# Patient Record
Sex: Female | Born: 1945 | Race: Black or African American | Hispanic: No | Marital: Married | Smoking: Former smoker
Health system: Southern US, Community
[De-identification: ages and names within clinical notes are randomized; demographics above are authoritative.]

## PROBLEM LIST (undated history)

## (undated) DIAGNOSIS — I509 Heart failure, unspecified: Secondary | ICD-10-CM

## (undated) DIAGNOSIS — K08109 Complete loss of teeth, unspecified cause, unspecified class: Secondary | ICD-10-CM

## (undated) DIAGNOSIS — I10 Essential (primary) hypertension: Secondary | ICD-10-CM

## (undated) DIAGNOSIS — I428 Other cardiomyopathies: Secondary | ICD-10-CM

## (undated) DIAGNOSIS — K259 Gastric ulcer, unspecified as acute or chronic, without hemorrhage or perforation: Secondary | ICD-10-CM

## (undated) DIAGNOSIS — Z9989 Dependence on other enabling machines and devices: Secondary | ICD-10-CM

## (undated) DIAGNOSIS — M1990 Unspecified osteoarthritis, unspecified site: Secondary | ICD-10-CM

## (undated) DIAGNOSIS — J45909 Unspecified asthma, uncomplicated: Secondary | ICD-10-CM

## (undated) DIAGNOSIS — J449 Chronic obstructive pulmonary disease, unspecified: Secondary | ICD-10-CM

## (undated) DIAGNOSIS — E119 Type 2 diabetes mellitus without complications: Secondary | ICD-10-CM

## (undated) DIAGNOSIS — H9190 Unspecified hearing loss, unspecified ear: Secondary | ICD-10-CM

## (undated) DIAGNOSIS — M419 Scoliosis, unspecified: Secondary | ICD-10-CM

## (undated) DIAGNOSIS — G4733 Obstructive sleep apnea (adult) (pediatric): Secondary | ICD-10-CM

## (undated) DIAGNOSIS — K219 Gastro-esophageal reflux disease without esophagitis: Secondary | ICD-10-CM

## (undated) DIAGNOSIS — E785 Hyperlipidemia, unspecified: Secondary | ICD-10-CM

## (undated) DIAGNOSIS — J302 Other seasonal allergic rhinitis: Secondary | ICD-10-CM

## (undated) DIAGNOSIS — Z972 Presence of dental prosthetic device (complete) (partial): Secondary | ICD-10-CM

## (undated) DIAGNOSIS — D509 Iron deficiency anemia, unspecified: Secondary | ICD-10-CM

## (undated) HISTORY — DX: Heart failure, unspecified: I50.9

## (undated) HISTORY — PX: APPENDECTOMY: SHX54

## (undated) HISTORY — DX: Chronic obstructive pulmonary disease, unspecified: J44.9

## (undated) HISTORY — DX: Scoliosis, unspecified: M41.9

## (undated) HISTORY — DX: Dependence on other enabling machines and devices: Z99.89

## (undated) HISTORY — DX: Hyperlipidemia, unspecified: E78.5

## (undated) HISTORY — DX: Unspecified osteoarthritis, unspecified site: M19.90

## (undated) HISTORY — PX: HERNIA REPAIR: SHX51

## (undated) HISTORY — PX: CARDIAC CATHETERIZATION: SHX172

## (undated) HISTORY — DX: Obstructive sleep apnea (adult) (pediatric): G47.33

## (undated) HISTORY — DX: Gastro-esophageal reflux disease without esophagitis: K21.9

## (undated) HISTORY — DX: Type 2 diabetes mellitus without complications: E11.9

## (undated) HISTORY — PX: BREAST CYST ASPIRATION: SHX578

## (undated) HISTORY — PX: ABDOMINAL HYSTERECTOMY: SHX81

## (undated) HISTORY — DX: Essential (primary) hypertension: I10

## (undated) HISTORY — PX: CLAVICLE SURGERY: SHX598

## (undated) HISTORY — PX: BREAST SURGERY: SHX581

## (undated) HISTORY — PX: OTHER SURGICAL HISTORY: SHX169

---
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
## 1997-02-19 HISTORY — PX: BREAST BIOPSY: SHX20

## 2003-04-14 ENCOUNTER — Other Ambulatory Visit: Payer: Self-pay

## 2006-06-01 ENCOUNTER — Emergency Department: Payer: Self-pay | Admitting: Emergency Medicine

## 2007-02-20 ENCOUNTER — Inpatient Hospital Stay: Payer: Self-pay | Admitting: Internal Medicine

## 2007-02-20 ENCOUNTER — Other Ambulatory Visit: Payer: Self-pay

## 2007-03-21 ENCOUNTER — Ambulatory Visit: Payer: Self-pay | Admitting: Cardiovascular Disease

## 2007-05-21 ENCOUNTER — Emergency Department: Payer: Self-pay | Admitting: Emergency Medicine

## 2007-11-06 ENCOUNTER — Encounter
Admission: RE | Admit: 2007-11-06 | Discharge: 2007-11-06 | Payer: Self-pay | Admitting: Physical Medicine & Rehabilitation

## 2008-01-08 ENCOUNTER — Ambulatory Visit: Payer: Self-pay | Admitting: Cardiovascular Disease

## 2008-01-08 LAB — CONVERTED CEMR LAB
CO2: 25 meq/L (ref 19–32)
Chloride: 106 meq/L (ref 96–112)
Hemoglobin: 12 g/dL (ref 12.0–15.0)
INR: 1 (ref 0.0–1.5)
MCV: 94.2 fL (ref 78.0–100.0)
Potassium: 3.8 meq/L (ref 3.5–5.3)
Prothrombin Time: 13.9 s (ref 11.6–15.2)
RBC: 3.96 M/uL (ref 3.87–5.11)
WBC: 6.6 10*3/uL (ref 4.0–10.5)

## 2008-01-16 ENCOUNTER — Ambulatory Visit: Payer: Self-pay | Admitting: Cardiovascular Disease

## 2008-01-16 ENCOUNTER — Ambulatory Visit: Payer: Self-pay

## 2008-01-29 ENCOUNTER — Ambulatory Visit: Payer: Self-pay | Admitting: Cardiovascular Disease

## 2008-12-16 DIAGNOSIS — I10 Essential (primary) hypertension: Secondary | ICD-10-CM | POA: Insufficient documentation

## 2008-12-16 DIAGNOSIS — E785 Hyperlipidemia, unspecified: Secondary | ICD-10-CM | POA: Insufficient documentation

## 2010-07-04 NOTE — Assessment & Plan Note (Signed)
[facility] OFFICE NOTE   NAME:Diane Patton, Diane Patton                          MRN:          [id number]  DATE:01/08/2008                            DOB:          [birth year]    PRIMARY CARE PHYSICIAN:  Dr. Gelene Mink.   REASON FOR CONSULTATION:  Chest pain, shortness of breath, and abnormal  nuclear stress test in January 2009.   HISTORY OF PRESENT ILLNESS:  Diane Patton is a pleasant 65 year old African  American female with a past medical history significant for diabetes  mellitus, hypertension, hyperlipidemia, and obstructive sleep apnea, who  is referred today for further evaluation of chest pain, shortness of  breath, and an abnormal finding on a nuclear stress test performed in  January 2009.  Diane Patton tells me that she has been having chest pain  several times per week as well as shortness of breath with minimal  exertion for over a year now.  She was referred to the [facility] for a nuclear stress test on March 17, 2007.  The  stress portion of the test was performed with adenosine infusion.  This  test was read by Dr. Kirke Corin, who is a cardiologist in [facility].  He reported evidence of ischemia in the inferior and  inferolateral wall that was suggestive of coronary artery disease in the  left circumflex artery distribution.  The patient was found to have  normal left ventricular function during this study with an ejection  fraction of 71%.  Wall motion was also reported to be normal.  Of note,  the patient had tongue swelling during the testing, which was felt to be  due to her latex allergy.  This was treated successfully with Benadryl  and Solu-Medrol.  Dr. Kirke Corin recommended heart catheterization during  that visit; however, the patient states that she has not undergone a  cardiac catheterization.  She was not aware that this had been  recommended.  She did not  follow up with Dr. Kirke Corin after the day of her  stress testing.   She comes in today and tells me that she is having episodes of  substernal chest pain several times per week.  There is typically no  radiation of the pain.  However, there is associated shortness of breath  and dizziness.  Overall, she has had a feeling of fatigue during the day  and has shortness of breath with minimal exertion.  She reports  occasional dizziness.  She uses a CPAP machine at home for obstructive  sleep apnea.  She denies having any palpitations, near syncopal  episodes, syncope, orthopnea, or PND.  She does report mild lower  extremity edema for the last 30 years following an operation that she  had in her feet.   PAST MEDICAL HISTORY:  1. Diabetes mellitus.  2. Hypertension.  3. Hyperlipidemia.  4. Osteoarthritis.  5. Gastroesophageal reflux disease.  6. Scoliosis.  7. Obstructive sleep apnea on CPAP.   PAST SURGICAL HISTORY:  1. Sinus surgery.  2. Hysterectomy.  3.  Umbilical hernia repair.  4. Bilateral breast lumpectomies.   ALLERGIES:  PENICILLIN, AMOXICILLIN, and LATEX GLOVES.   CURRENT MEDICATIONS:  1. Enteric-coated aspirin 81 mg once daily.  2. Multivitamin once daily.  3. Lasix 40 mg once daily.  4. Clonidine 0.1 mg twice daily.  5. Naproxen 375 mg twice daily.  6. Loratadine 10 mg once daily.  7. Imdur 30 mg once daily.  8. BuSpar 10 mg as directed.  9. Flonase 2 sprays in each nostril daily.  10.Atenolol 100 mg once daily.  11.Omeprazole 20 mg twice daily.  12.Klor-Con 10 mEq once daily.  13.Metformin 500 mg twice daily.  14.Estradiol 100 mg once daily.  15.Soma 350 mg twice daily.  16.Trazodone p.r.n.  17.Albuterol p.r.n.   SOCIAL HISTORY:  The patient smoked 1 pack of cigarettes per day for 10  years, but has not smoked in the last 25 years.  She denies use of  alcohol or illicit drugs.  She is married and has 4 children.  One of  her children is deceased.  She tells  me that she is disabled because of  her chronic back pain.   FAMILY HISTORY:  No family history of coronary artery disease.  The  patient's father died from Alzheimer disease and her mother died from an  enlarged heart at the age of 15.   REVIEW OF SYSTEMS:  As stated in the history of present illness, is  otherwise negative.   PHYSICAL EXAMINATION:  VITALS SIGNS:  Blood pressure 178/100, pulse 85  and regular, and respirations 12 and nonlabored.  GENERAL:  She is a pleasant, middle-aged [place] American female in no  acute distress.  She is alert and oriented x3.  HEENT:  Normal.  SKIN:  Warm and dry.  MUSCULOSKELETAL:  Muscle strength and tone is normal.  PSYCHIATRIC:  Mood and affect are appropriate.  NEUROLOGIC:  No focal neurological deficits.  NECK:  No JVD.  No carotid bruits.  No thyromegaly.  No lymphadenopathy.  LUNGS:  Clear to auscultation bilaterally without wheezes, rhonchi, or  crackles noted.  CARDIOVASCULAR:  Regular rate and rhythm with no murmurs, gallops, or  rubs noted.  ABDOMEN:  Soft and nontender.  Bowel sounds present.  EXTREMITIES:  Trace bilateral lower extremity edema.  Pulses are 1+ in  all extremities.   DIAGNOSTIC STUDIES:  1. A 12-lead EKG obtained in our office today shows normal sinus      rhythm with nonspecific ST and T-wave abnormalities noted.      Ventricular rate is 85 beats per minute.  2. Echocardiogram performed on March 10, 2007, and read by Dr. Kirke Corin      shows normal left ventricular systolic function with an ejection      fraction of 68%.  There is mild left ventricular hypertrophy with      mild diastolic dysfunction.  There is mild tricuspid regurgitation.      There were no other significant valvular abnormalities noted.  3. Carotid duplex study from March 10, 2007, showed no      hemodynamically significant stenosis in the internal carotid      arteries.  The right vertebral artery is patent with antegrade      flow.  Left  vertebral artery is not identified.  4. Adenosine myocardial perfusion study performed on March 17, 2007,      at [facility] [place] and      read by Dr. Kirke Corin, shows that the patient was  given adenosine for      the stress portion of her test.  The nuclear portion of the test      revealed a moderate-sized defect in the inferior and inferolateral      wall with partial reversibility.  Overall, left ventricular      systolic function was preserved with an ejection fraction of 71%.      There were no wall motion abnormalities noted.  This was read as      being significant for ischemia in the circumflex artery      distribution.   ASSESSMENT AND PLAN:  This is a pleasant 65 year old African American  female with multiple risk factors for coronary artery disease including  hypertension, diabetes mellitus, hyperlipidemia, obesity, and sedentary  lifestyle, who had an abnormal adenosine nuclear stress study in January  2009, suggesting inferior and inferolateral wall ischemia.  The patient  has not had a followup cardiac catheterization as recommended at that  time.  She continues to have episodes of chest discomfort and shortness  of breath several times per week, which could be secondary to  obstructive coronary artery disease.  I do not see any contraindications  currently to performing a diagnostic left heart catheterization.  I have  discussed this procedure in detail with the patient here in my office.  She is in agreement to have this procedure done.  I have explained the  risk of bleeding and infection in the groin as well as stroke and heart  attack from the passage of the diagnostic catheters around the aortic  arch and into the coronary arteries.  I have also explained the  potential nephrotoxicity of IV contrast dye.  We will proceed with the  heart catheterization, and we will plan it for January 16, 2008, at  [facility].  We will obtain a metabolic profile as  well as a CBC and coagulation studies today.  We will also obtain a  baseline chest x-ray prior to the cardiac catheterization.  The patient  will be seen back in this office in 3-4 weeks to review the results of  her cardiac catheterization.     Verne Carrow, MD  Electronically Signed    CM/MedQ  DD: 01/08/2008  DT: 01/09/2008  Job #: [id number]   cc:   Dr. Gelene Mink

## 2010-07-04 NOTE — Assessment & Plan Note (Signed)
[facility] OFFICE NOTE   NAME:Diane Patton, Diane Patton                          MRN:          [id number]  DATE:01/29/2008                            DOB:          1946-01-19    PRIMARY CARE PHYSICIAN:  Dr. Evelene Croon (the patient was followed  in this clinic by [facility], PA-C).   HISTORY OF PRESENT ILLNESS:  Diane Patton a pleasant 65 year old African  American female with a past medical history significant for diabetes  mellitus, hypertension, hyperlipidemia, and obstructive sleep apnea who  was initially seen in our office on January 08, 2008, for further  evaluation of complaints of occasional chest discomfort.  The patient  had been found to have possible inferior and inferolateral ischemia on a  stress test performed in the [facility] in January  2009.  Based on her symptoms and the abnormal stress test, I elected to  perform a diagnostic left heart catheterization.  This was performed on  January 16, 2008, at [facility].  This is  outlined in detail below, but essentially just showed nonobstructive  coronary artery disease with normal LV function.  The patient comes in  today for followup and tells me that she has had no recurrence of her  chest pain over the last several weeks.  She notes occasional shortness  of breath with exertion, but has no other complaints.  She denies any  palpitations, diaphoresis, nausea, vomiting, near syncope, syncope,  orthopnea, PND, or lower extremity edema.   PAST MEDICAL HISTORY:  1. Diabetes mellitus.  2. Hypertension.  3. Hyperlipidemia.  4. Osteoarthritis.  5. Gastroesophageal reflux disease.  6. Scoliosis.  7. Obstructive sleep apnea on CPAP.   CURRENT MEDICATIONS:  1. Enteric-coated aspirin 81 mg once daily.  2. Multivitamin once daily.  3. Lasix 40 mg, used on a p.r.n. basis.  4. Clonidine 0.1 mg twice  daily.  5. Naprosyn 375 mg twice daily.  6. Loratadine 10 mg once daily.  7. Imdur 30 mg once daily.  8. Flonase 2 puffs in each nostril once daily.  9. Atenolol 100 mg once daily.  10.Omeprazole 20 mg twice daily.  11.Klor-Con 10 mEq once daily.  12.Metformin 500 mg twice daily.  13.Estradiol 100 mg once daily.  14.Soma 350 mg twice daily.  15.A statin medication that was started recently.   REVIEW OF SYSTEMS:  As stated in history present illness and is  otherwise negative.   PHYSICAL EXAMINATION:  VITALS:  Blood pressure 150/88, pulse 84 and  regular, respirations 12 and unlabored.  GENERAL:  She is a pleasant middle-aged African American female in no  acute distress.  She is alert and oriented x3.  NECK:  No JVD.  No carotid bruits.  No thyromegaly.  No lymphadenopathy.  LUNGS:  Clear to auscultation bilaterally without wheezes, rhonchi, or  crackles noted.  CARDIOVASCULAR:  Regular rate and rhythm without  murmurs, gallops, or rubs noted.  ABDOMEN:  Soft, nontender, nondistended.  Bowel sounds present.  EXTREMITIES:  No evidence of edema.  The right groin cath site is  without evidence of hematoma.  Pulses are 2+.  SKIN:  Warm and dry.   DIAGNOSTIC STUDIES:  1. Diagnostic left heart catheterization performed on January 16, 2008, showed a 20% stenosis in the distal left main coronary      artery.  There was a 40% stenosis in the ostium of the left      anterior descending coronary artery.  There was 30% stenosis in the      midportion of the LAD.  The first 2 diagonal branches were medium-      size vessels with no evidence of disease.  The third diagonal was      small size and showed no evidence of disease.  There was a 30%      lesion in the midportion of the circumflex artery.  The first      obtuse marginal branch was medium size and had no evidence of      disease.  The second marginal branch was small size and showed no      evidence of disease.  The third  marginal was medium size and showed      no evidence of disease.  There were minor luminal irregularities      noted in the right coronary artery which was a normal-sized vessel.      This bifurcated into a PDA segment and a posterolateral segment      that were free of disease.  Left ventricular function was noted to      be normal.   ASSESSMENT AND PLAN:  This is a pleasant 65 year old [place] American  female with history of hypertension, diabetes mellitus, hyperlipidemia,  obesity, and sedentary lifestyle, who was recently found by left heart  catheterization to have nonobstructive coronary artery disease.  I have  recommended that the patient continue to take daily aspirin as well as a  beta-blocker and a long-acting nitrate.  I also started her on statin  medication when she was discharged from the cath lab after her  procedure.  She tells me that she is to follow up in her primary care  office in approximately 6-7 weeks.  We are asking that a fasting lipid  profile as well as liver function tests and a CK level be drawn at the  time of that visit.  We will send an order over requesting these  laboratory values to be obtained.  The patient will be followed in this  office on a yearly basis.  We will arrange for followup in 1 year.  I  have encouraged her to continue to follow with her primary care  physician's office for her other primary care needs.  I have also  discussed the  importance of daily exercise as well as appropriate diet and weight  loss.  The patient is agreeable to the current plan.     Verne Carrow, MD  Electronically Signed    CM/MedQ  DD: 01/29/2008  DT: 01/29/2008  Job #: [id number]   cc:   Diane Rhodes, PA-C

## 2010-08-08 ENCOUNTER — Encounter: Payer: Self-pay | Admitting: Cardiovascular Disease

## 2012-09-03 ENCOUNTER — Inpatient Hospital Stay: Payer: Self-pay | Admitting: Specialist

## 2012-09-03 LAB — BASIC METABOLIC PANEL
BUN: 16 mg/dL (ref 7–18)
Calcium, Total: 9 mg/dL (ref 8.5–10.1)
Chloride: 104 mmol/L (ref 98–107)
Co2: 25 mmol/L (ref 21–32)
EGFR (Non-African Amer.): 45 — ABNORMAL LOW
Glucose: 359 mg/dL — ABNORMAL HIGH (ref 65–99)
Osmolality: 291 (ref 275–301)
Potassium: 3.6 mmol/L (ref 3.5–5.1)

## 2012-09-03 LAB — CBC
HCT: 39.1 % (ref 35.0–47.0)
MCHC: 30.8 g/dL — ABNORMAL LOW (ref 32.0–36.0)
Platelet: 348 10*3/uL (ref 150–440)
RDW: 14.7 % — ABNORMAL HIGH (ref 11.5–14.5)

## 2012-09-03 LAB — HEMOGLOBIN A1C: Hemoglobin A1C: 7.5 % — ABNORMAL HIGH (ref 4.2–6.3)

## 2012-09-03 LAB — CK TOTAL AND CKMB (NOT AT ARMC)
CK, Total: 605 U/L — ABNORMAL HIGH (ref 21–215)
CK-MB: 3.5 ng/mL (ref 0.5–3.6)

## 2012-09-03 LAB — TROPONIN I
Troponin-I: <0.02 ng/mL
Troponin-I: 0.14 ng/mL — ABNORMAL HIGH

## 2012-09-04 LAB — BASIC METABOLIC PANEL
BUN: 17 mg/dL (ref 7–18)
Chloride: 103 mmol/L (ref 98–107)
Co2: 24 mmol/L (ref 21–32)
Creatinine: 1.34 mg/dL — ABNORMAL HIGH (ref 0.60–1.30)
EGFR (Non-African Amer.): 40 — ABNORMAL LOW
Potassium: 3.4 mmol/L — ABNORMAL LOW (ref 3.5–5.1)
Sodium: 138 mmol/L (ref 136–145)

## 2012-09-04 LAB — CBC WITH DIFFERENTIAL/PLATELET
Basophil #: 0 10*3/uL (ref 0.0–0.1)
Basophil %: 0.1 %
Eosinophil #: 0 10*3/uL (ref 0.0–0.7)
Eosinophil %: 0 %
HGB: 10 g/dL — ABNORMAL LOW (ref 12.0–16.0)
Lymphocyte %: 5.3 %
MCH: 27.4 pg (ref 26.0–34.0)
MCHC: 33.1 g/dL (ref 32.0–36.0)
MCV: 83 fL (ref 80–100)
Monocyte %: 2.5 %
Platelet: 238 10*3/uL (ref 150–440)
RBC: 3.64 10*6/uL — ABNORMAL LOW (ref 3.80–5.20)
RDW: 14.6 % — ABNORMAL HIGH (ref 11.5–14.5)
WBC: 11.7 10*3/uL — ABNORMAL HIGH (ref 3.6–11.0)

## 2012-09-04 LAB — LIPID PANEL
Cholesterol: 93 mg/dL (ref 0–200)
Ldl Cholesterol, Calc: 0 mg/dL (ref 0–100)
Triglycerides: 236 mg/dL — ABNORMAL HIGH (ref 0–200)
VLDL Cholesterol, Calc: 47 mg/dL — ABNORMAL HIGH (ref 5–40)

## 2012-09-04 LAB — POTASSIUM
Potassium: 2.9 mmol/L — ABNORMAL LOW (ref 3.5–5.1)
Potassium: 4 mmol/L (ref 3.5–5.1)

## 2012-09-04 LAB — MAGNESIUM
Magnesium: 1 mg/dL — ABNORMAL LOW
Magnesium: 2.1 mg/dL

## 2012-09-05 LAB — BASIC METABOLIC PANEL
Anion Gap: 7 (ref 7–16)
BUN: 22 mg/dL — ABNORMAL HIGH (ref 7–18)
Calcium, Total: 9.6 mg/dL (ref 8.5–10.1)
Chloride: 108 mmol/L — ABNORMAL HIGH (ref 98–107)
Co2: 26 mmol/L (ref 21–32)
Creatinine: 1.1 mg/dL (ref 0.60–1.30)
EGFR (African American): 59 — ABNORMAL LOW

## 2012-09-05 LAB — MAGNESIUM: Magnesium: 1.9 mg/dL

## 2012-09-06 LAB — BASIC METABOLIC PANEL
Anion Gap: 5 — ABNORMAL LOW (ref 7–16)
BUN: 22 mg/dL — ABNORMAL HIGH (ref 7–18)
Chloride: 102 mmol/L (ref 98–107)
Creatinine: 0.98 mg/dL (ref 0.60–1.30)
EGFR (African American): >60
EGFR (Non-African Amer.): 59 — ABNORMAL LOW
Glucose: 153 mg/dL — ABNORMAL HIGH (ref 65–99)
Sodium: 139 mmol/L (ref 136–145)

## 2012-09-06 LAB — EXPECTORATED SPUTUM ASSESSMENT W REFEX TO RESP CULTURE

## 2012-09-07 LAB — BASIC METABOLIC PANEL
Anion Gap: 7 (ref 7–16)
BUN: 25 mg/dL — ABNORMAL HIGH (ref 7–18)
Calcium, Total: 9.7 mg/dL (ref 8.5–10.1)
Creatinine: 1 mg/dL (ref 0.60–1.30)
EGFR (African American): >60
EGFR (Non-African Amer.): 57 — ABNORMAL LOW
Osmolality: 280 (ref 275–301)
Potassium: 3.1 mmol/L — ABNORMAL LOW (ref 3.5–5.1)
Sodium: 135 mmol/L — ABNORMAL LOW (ref 136–145)

## 2012-09-08 LAB — CULTURE, BLOOD (SINGLE)

## 2012-10-14 ENCOUNTER — Ambulatory Visit: Payer: Self-pay | Admitting: Cardiovascular Disease

## 2013-07-30 ENCOUNTER — Ambulatory Visit: Payer: Self-pay | Admitting: Orthopedic Surgery

## 2013-08-12 ENCOUNTER — Ambulatory Visit: Payer: Self-pay | Admitting: Orthopedic Surgery

## 2013-08-12 DIAGNOSIS — I10 Essential (primary) hypertension: Secondary | ICD-10-CM

## 2013-08-12 LAB — BASIC METABOLIC PANEL
ANION GAP: 6 — AB (ref 7–16)
BUN: 12 mg/dL (ref 7–18)
Calcium, Total: 10.3 mg/dL — ABNORMAL HIGH (ref 8.5–10.1)
Chloride: 103 mmol/L (ref 98–107)
Co2: 33 mmol/L — ABNORMAL HIGH (ref 21–32)
Creatinine: 0.71 mg/dL (ref 0.60–1.30)
EGFR (African American): >60
EGFR (Non-African Amer.): >60
Glucose: 97 mg/dL (ref 65–99)
OSMOLALITY: 283 (ref 275–301)
POTASSIUM: 3.2 mmol/L — AB (ref 3.5–5.1)
Sodium: 142 mmol/L (ref 136–145)

## 2013-08-12 LAB — CBC
HCT: 34.5 % — ABNORMAL LOW (ref 35.0–47.0)
HGB: 11.1 g/dL — ABNORMAL LOW (ref 12.0–16.0)
MCH: 29.3 pg (ref 26.0–34.0)
MCHC: 32.2 g/dL (ref 32.0–36.0)
MCV: 91 fL (ref 80–100)
Platelet: 219 10*3/uL (ref 150–440)
RBC: 3.79 10*6/uL — ABNORMAL LOW (ref 3.80–5.20)
RDW: 14.4 % (ref 11.5–14.5)
WBC: 7.6 10*3/uL (ref 3.6–11.0)

## 2013-08-12 LAB — URINALYSIS, COMPLETE
BACTERIA: NONE SEEN
BILIRUBIN, UR: NEGATIVE
Blood: NEGATIVE
Glucose,UR: NEGATIVE mg/dL (ref 0–75)
Ketone: NEGATIVE
Leukocyte Esterase: NEGATIVE
Nitrite: NEGATIVE
PROTEIN: NEGATIVE
Ph: 8 (ref 4.5–8.0)
RBC,UR: 1 /HPF (ref 0–5)
Specific Gravity: 1.021 (ref 1.003–1.030)
Squamous Epithelial: 3

## 2013-08-12 LAB — PROTIME-INR
INR: 1
Prothrombin Time: 12.9 secs (ref 11.5–14.7)

## 2013-08-12 LAB — MRSA PCR SCREENING

## 2013-08-12 LAB — SEDIMENTATION RATE: ERYTHROCYTE SED RATE: 10 mm/h (ref 0–30)

## 2013-08-12 LAB — APTT: ACTIVATED PTT: 28.3 s (ref 23.6–35.9)

## 2013-12-05 ENCOUNTER — Inpatient Hospital Stay: Payer: Self-pay | Admitting: Internal Medicine

## 2013-12-05 LAB — CK TOTAL AND CKMB (NOT AT ARMC)
CK, Total: 121 U/L
CK, Total: 162 U/L
CK-MB: 2.1 ng/mL (ref 0.5–3.6)
CK-MB: 3 ng/mL (ref 0.5–3.6)

## 2013-12-05 LAB — TROPONIN I
Troponin-I: 0.13 ng/mL — ABNORMAL HIGH
Troponin-I: 0.15 ng/mL — ABNORMAL HIGH

## 2013-12-06 LAB — CBC WITH DIFFERENTIAL/PLATELET
Basophil #: 0 10*3/uL (ref 0.0–0.1)
Basophil %: 0.1 %
Eosinophil #: 0 10*3/uL (ref 0.0–0.7)
Eosinophil %: 0 %
HCT: 29.2 % — ABNORMAL LOW (ref 35.0–47.0)
HGB: 9.3 g/dL — AB (ref 12.0–16.0)
LYMPHS ABS: 1 10*3/uL (ref 1.0–3.6)
LYMPHS PCT: 9 %
MCH: 29 pg (ref 26.0–34.0)
MCHC: 31.9 g/dL — AB (ref 32.0–36.0)
MCV: 91 fL (ref 80–100)
MONO ABS: 0.2 x10 3/mm (ref 0.2–0.9)
Monocyte %: 2.2 %
NEUTROS PCT: 88.7 %
Neutrophil #: 10 10*3/uL — ABNORMAL HIGH (ref 1.4–6.5)
PLATELETS: 248 10*3/uL (ref 150–440)
RBC: 3.2 10*6/uL — AB (ref 3.80–5.20)
RDW: 14.3 % (ref 11.5–14.5)
WBC: 11.2 10*3/uL — ABNORMAL HIGH (ref 3.6–11.0)

## 2013-12-06 LAB — BASIC METABOLIC PANEL
Anion Gap: 12 (ref 7–16)
BUN: 17 mg/dL (ref 7–18)
Calcium, Total: 8.5 mg/dL (ref 8.5–10.1)
Chloride: 107 mmol/L (ref 98–107)
Co2: 22 mmol/L (ref 21–32)
Creatinine: 1.26 mg/dL (ref 0.60–1.30)
EGFR (African American): 54 — ABNORMAL LOW
GFR CALC NON AF AMER: 45 — AB
Glucose: 199 mg/dL — ABNORMAL HIGH (ref 65–99)
OSMOLALITY: 288 (ref 275–301)
POTASSIUM: 4.2 mmol/L (ref 3.5–5.1)
Sodium: 141 mmol/L (ref 136–145)

## 2013-12-08 LAB — CREATININE, SERUM
Creatinine: 1.22 mg/dL (ref 0.60–1.30)
EGFR (Non-African Amer.): 46 — ABNORMAL LOW
GFR CALC AF AMER: 56 — AB

## 2013-12-10 LAB — CULTURE, BLOOD (SINGLE)

## 2013-12-25 ENCOUNTER — Ambulatory Visit: Payer: Self-pay | Admitting: Family

## 2014-01-21 ENCOUNTER — Ambulatory Visit: Payer: Self-pay | Admitting: Family

## 2014-03-01 ENCOUNTER — Ambulatory Visit: Payer: Self-pay | Admitting: Family

## 2014-05-12 ENCOUNTER — Ambulatory Visit: Payer: Self-pay | Admitting: Cardiology

## 2014-05-26 ENCOUNTER — Ambulatory Visit
Admit: 2014-05-26 | Disposition: A | Discharge status: Home / Self Care | Payer: Self-pay | Attending: Family | Admitting: Family

## 2014-06-09 DIAGNOSIS — I5023 Acute on chronic systolic (congestive) heart failure: Secondary | ICD-10-CM | POA: Insufficient documentation

## 2014-06-09 DIAGNOSIS — I5022 Chronic systolic (congestive) heart failure: Secondary | ICD-10-CM | POA: Insufficient documentation

## 2014-06-11 NOTE — Discharge Summary (Signed)
PATIENT NAME:  Diane Patton, Diane Patton MR#:  [id number] DATE OF BIRTH:  November 02, 1945  DATE OF ADMISSION:  09/03/2012 DATE OF DISCHARGE:  09/07/2012  HISTORY AND PHYSICAL:  For a detailed note, please take a look at the history and physical done on admission by Dr. Lunette Stands.   DISCHARGE DIAGNOSES:  1.  Acute respiratory failure, likely secondary to congestive heart failure.  2.  Acute on chronic systolic congestive heart failure.  3.  Diabetes.  4.  Restless leg syndrome.  5.  Hyperlipidemia.  6.  Chronic obstructive pulmonary disease.  7.  Medical noncompliance.  8.  Osteoarthritis.   DIET:  The patient is being discharged on a low-sodium, low-fat, American Diabetic Association diet.   ACTIVITY: As tolerated.   FOLLOWUP: Followup with Dr. Neoma Laming in the next 1 to 2 weeks. Also follow up with Dr. Brunetta Genera in 1 to 2 weeks.    DISCHARGE MEDICATIONS: As follows: Lasix 40 mg daily, metformin 500 mg b.i.d., loratadine 10 mg daily, estradiol 1 mg daily, hydrochlorothiazide/valsartan 25/320, 1 tab daily, Requip 0.5 mg at bedtime, Singulair 10 mg at bedtime, omeprazole 20 mg b.i.d., Flexeril 10 mg t.i.d. as needed, Crestor 20 mg daily, tramadol 50 mg q.i.d. as needed, multivitamin daily, fish oil 1000 mg daily, vitamin E 400 international units daily, cod liver oil, vitamin A and vitamin D supplement daily, vitamin B12, 500 mcg daily, vitamin D3, 1000 international units daily, ketoconazole 2% topical cream to be applied as needed, trazodone 50 mg at bedtime, albuterol inhaler 2 puffs q.i.d. as needed, Advair 250/50, 1 puff b.i.d., meloxicam 7.5 mg daily, amlodipine 10 mg daily, atenolol 100 mg daily and aspirin 81 mg daily.   [place] COURSE: Dr. Neoma Laming from cardiology.  Dr.  Mortimer Fries from pulmonary critical care.   PERTINENT STUDIES DONE DURING THE HOSPITAL COURSE: Are as follows:  A chest x-ray done on admission showing findings suggestive of mild interstitial edema. No alveolar  pneumonia or pleural effusion noted. A 2-dimensional echocardiogram done showing ejection fraction to be 30% to 35%, severely dilated left atrium, mildly dilated right atrium, trivial pericardial effusion, mildly elevated pulmonary artery systolic pressures.  Sputum cultures to be consistent with normal flora and blood cultures essentially negative.   BRIEF HOSPITAL COURSE: This is a 69 year old female with medical problems as mentioned above, presented to the hospital on 09/03/2012 secondary to shortness of breath and respiratory failure and was urgently intubated.   1.  Acute respiratory failure:  The likely cause of this was probably acute on chronic systolic congestive heart failure. Initially, when the patient was admitted to the hospital the patient was urgently intubated, started on IV steroids, also started on IV diuretics and broad spectrum IV antibiotics to cover for COPD exacerbation and also congestive heart failure. Upon getting an echocardiogram and further clinically assessing her, the likely cause of her respiratory failure was probably related to congestive heart failure. She diuresed well with IV Lasix. She was only intubated for 2 days, shortly thereafter extubated, and has been on room air doing clinically very well. The patient apparently was not compliant with her diuretics and her regular home meds which is what led to her congestive heart failure. She was strongly advised to be compliant with her medications. A cardiology consult was obtained. The patient was seen by Dr. Neoma Laming, who agreed with that, and he will continue followup with the patient in the next week to 2 weeks after discharge. The patient, as mentioned, is currently  off steroids, off antibiotics, is being discharged on Lasix, beta blockers and valsartan as stated.  2.  Acute on chronic congestive heart failure: This was likely acute on chronic systolic dysfunction secondary to cardiomyopathy, EF of 30 to 35% based on  echocardiogram. The patient was noncompliant with her medications. She was diuresed aggressively with IV Lasix and has clinically significantly improved. Currently, she is being discharged on her Lasix, her atenolol and her valsartan/HCTZ combination medication. She will follow up with Dr. Humphrey Rolls in the next 1 to 2 weeks.  3.  Elevated troponin:  This was likely in the setting of hypoxemia and respiratory failure, likely demand ischemia. There was no evidence of acute coronary syndrome. Her troponin did trend down. She will continue her aspirin, beta blocker and statin as stated.  4.  Diabetes:  The patient's blood sugars remained stable while in the hospital. Her metformin was held. She was maintained on sliding scale insulin and some oral glipizide. For now she is being discharged on her oral metformin as stated.  5.  Hypomagnesemia and hypokalemia:  This was secondary to aggressive diuresis with Lasix.  Her potassium and magnesium have since then been replaced and normalized.  6.  Chronic obstructive pulmonary disease:  The patient is already on albuterol inhaler as needed. Advair is being added to her regimen for her COPD management. She was ambulated on room air and did not need home oxygen.   DISPOSITION: The patient is being discharged home with home health nursing services for medication management and for her management of her congestive heart failure. The patient is in agreement with this plan.   TIME SPENT: 40 minutes.   ____________________________ Belia Heman. Verdell Carmine, MD [id number] D: 09/07/2012 13:54:35 ET T: 09/07/2012 22:37:48 ET JOB#: [id number]  cc: Belia Heman. Verdell Carmine, MD, <Dictator> Dionisio David, MD [place] A. Brunetta Genera, MD Henreitta Leber MD ELECTRONICALLY SIGNED 09/08/2012 14:48

## 2014-06-11 NOTE — H&P (Signed)
PATIENT NAME:  Diane Patton, Diane Patton MR#:  [id number] DATE OF BIRTH:  [birth year]  DATE OF ADMISSION:  09/03/2012  PRIMARY CARE PHYSICIAN:  Dr. Lorelee Market.   REFERRING PHYSICIAN:  Dr. Owens Shark.   CHIEF COMPLAINT:  Respiratory distress.   HISTORY OF PRESENT ILLNESS:  The patient is a 69 year old female with history of asthma. Has been experiencing symptoms of the shortness of breath for the last 5 days. Went to primary care physician. Was given steroids. Despite that, the patient's condition continued to get worse. Today, patient was found to be in severe respiratory distress. Considering this, EMS was called and was brought to the Emergency Department. The patient was hypoxic with low oxygen saturations to 60s. The patient was placed on BiPAP; however, continued to have O2 sats of 80% on 100% FiO2 on BiPAP. Concerning this, patient was intubated. Per Emergency Department physician, patient had a frothy sputum, was returned through ET tube. The chest x-ray is consistent with pneumonia. The patient was receiving treatment as an outpatient with  azithromycin. No family members are available at bedside. The history is mainly obtained from the previous medical records and from the Emergency Department physician. In the Emergency Department, patient received vancomycin and Rocephin. The patient is also found to be hypotensive with systolic blood pressure in the 60s. Central line was placed and the patient was started on dopamine and the Levophed drip with improvement of the blood pressure to 120s. The patient's initial EKG was bradycardia with left bundle branch block. However, the initial set of cardiac enzymes are negative. BNP is currently pending. The patient was also found to have initial lactic acid of 6.   PAST MEDICAL HISTORY: 1.  Asthma.  2.  Hypertension.  3.  Noninsulin-dependent diabetes mellitus.  4.  Osteoarthritis.   PAST SURGICAL HISTORY: 1.  Cholecystectomy.  2.  Appendectomy.  3.   Hysterectomy.   ALLERGIES:   PENICILLIN AND ALLEGRA, UNKNOWN REACTION.   HOME MEDICATIONS: 1.  Protonix 40 mg once a day.  2.  Prednisone 40 mg once a day.  3.  Lisinopril 10 mg once a day.  4.  Gabapentin 300 mg 3 times a day.  5.  Lasix 40 mg 2 times a day.  6.  Doxepin 25 mg once a day.  7.  Desyrel 50 mg orally once a day.  8.  Buspirone 10 mg 3 times a day.  9.  Atenolol 100 mg once a day.  10.  Aspirin 81 mg once a day.  11.  Advair Diskus 250 mcg 2 times a day.   SOCIAL HISTORY: No history of smoking, drinking alcohol or use of illicit drugs. Married. Lives with her husband.  FAMILY HISTORY:  Significant for coronary artery disease.   REVIEW OF SYSTEMS:  Could not be obtained from the patient as patient is currently intubated.   PHYSICAL EXAMINATION: GENERAL:  This is well-built, well-nourished, age-appropriate female lying down in the bed, not in distress, intubated.  VITAL SIGNS:  Temperature 98.5, pulse 87, blood pressure 120/40 on Levophed and dopamine, respiratory rate of 16, oxygen saturation 100% on room air.  HEENT:  Head normocephalic, atraumatic. There is no sclerae icterus. Conjunctivae normal. Pupils equal and react to light. Mucous membranes are moist. ET tube in mouth. Could not examine the oropharynx.  NECK:  Supple. No lymphadenopathy. No JVD. No carotid bruit.  CHEST:  Has no focal tenderness. Bilateral diffuse wheezing and crackles are heard.  HEART:  S1, S2 regular. No murmurs are heard.  ABDOMEN:  Bowel sounds present. Soft, nontender, nondistended.  EXTREMITIES:  No pedal edema. Pulses 2+.   SKIN:  No rash or lesions.  MUSCULOSKELETAL:  Could not examine the range of motion.  NEUROLOGICAL:  The patient is not oriented to place, person and time. Cranial nerves:  No apparent cranial nerve abnormalities. Could not examine the motor and sensory exam.   LABORATORY DATA:  ABGs:  PH of 7.20, pCO2 of 49, pO2 of 259, on ventilator. CMP: Glucose 359. The rest of  the values are within normal limits.   CBC:  WBC of 19.6, hemoglobin 12, platelet count of 348.   Troponin less than 0.02. CK 120, MB of 1.3.   EKG, 12-lead:  Left bundle branch block.   Chest x-ray shows bilateral pulmonary edema.   ASSESSMENT AND PLAN:  The patient is a 69 year old female, comes with 5 days of respiratory distress.  1.  Respiratory failure, acute. Differential diagnoses are pulmonary edema versus acute respiratory distress syndrome from infection and pneumonia. Initial set of cardiac enzymes are negative. We will check the BNP. Also, obtain echocardiogram. The patient's oxygen saturations are doing well at FiO2 of 100%; can wean down for O2 sats greater than 90%. Considering patient's elevated lactic acid, hypotension, will also treat it as an infection with the vancomycin and cefepime. The patient received 1 dose of Rocephin without any reaction. Will obtain blood and sputum cultures. Continue with the breathing treatments and Solu-Medrol. Will also check the cortisol level.  2.  Hypotension shock.  is septic. Will continue with the pressors. Echocardiogram in the morning.  3.  Blood cultures. Continue with antibiotics.  4.  History of asthma. Continue with the current treatment.  5.  Diabetes mellitus, on oral medication, will hold. Continue with the sliding scale insulin.  6.  Keep the patient on deep vein thrombosis prophylaxis with Lovenox.   TIME SPENT:  Critical care time 55 minutes.   ____________________________ Monica Becton, MD pv:NTS D: 09/03/2012 04:55:00 ET T: 09/03/2012 06:11:32 ET JOB#: [id number]  cc: Meindert A. Brunetta Genera, MD Monica Becton, MD, <Dictator>   Grier Mitts Cassondra Stachowski MD ELECTRONICALLY SIGNED 09/28/2012 21:33

## 2014-06-11 NOTE — Consult Note (Signed)
PATIENT NAME:  Diane Patton, Diane Patton MR#:  [id number] DATE OF BIRTH:  June 13, 1945  DATE OF CONSULTATION:  09/03/2012  CONSULTING PHYSICIAN:  Dionisio David, MD  INDICATION FOR CONSULTATION: Elevated troponin level.  This is a 69 year old [place] female who came into the hospital with respiratory distress currently intubated and sedated, unable to get any adequate history but it appears that the patient came into the hospital, was hypoxic with saturation in the 60s. The patient was initially placed on BiPAP and then was intubated. The patient also appeared to be septic and was given antibiotic and the patient also presented with hypotension with systolic blood pressure of 60s and was started on Levophed.   PAST MEDICAL HISTORY:  History of asthma, hypertension, non-insulin-dependent diabetes, osteoarthritis, cholecystectomy, appendectomy, hysterectomy.   ALLERGIES:  PENICILLIN, ALLEGRA.   SOCIAL HISTORY: No history of EtOH abuse or some smoking.   FAMILY HISTORY: Positive for coronary artery disease.   PHYSICAL EXAMINATION: VITAL SIGNS: Blood pressure right now is 134/65, respirations 26, pulse 84, saturation 98. She is alert and oriented x 0. She is intubated.  HEAD, EYES, EARS, NOSE AND THROAT:  Revealed positive JVD. LUNGS: Good air entry.  No rales or wheezing.  HEART: Regular rate and rhythm. Normal S1, S2. No audible murmur.  ABDOMEN: Soft, nontender, positive bowel sounds.  EXTREMITIES: No pedal edema.  NEUROLOGIC: The patient appears to be alert and oriented x 0. The patient is right now intubated and sedated.  LABORATORY DATA: Blood cultures, there is no growth yet. Initial troponin level is 0.02. BUN is 16. Creatinine 1.22. Glucose is 359.   ASSESSMENT AND PLAN:  The patient's EKG shows sinus rhythm with sinus bradycardia 56 beats per minute, left bundle branch block.   ASSESSMENT AND PLAN: The patient has respiratory failure most likely it is secondary to underlying chronic obstructive  pulmonary disease. It does not appear that this is due to congestive heart failure. However, it could be a combination of congestive heart failure and chronic obstructive pulmonary disease exacerbation with possible pneumonia even though chest x-ray was negative. The patient came in with respiratory acidosis, so it appears that respiratory failure may be due to metabolic causes, as well as chronic obstructive pulmonary disease. Will look at the echocardiogram to further recommend treatment. Advise ruling out myocardial infarction with serial enzymes and will follow the patient with you.  Thank you very much for the referral.     ____________________________ Dionisio David, MD sak:ce D: 09/03/2012 11:14:00 ET T: 09/03/2012 11:28:10 ET JOB#: [id number]  cc: Dionisio David, MD, <Dictator> Dionisio David MD ELECTRONICALLY SIGNED 09/29/2012 8:12

## 2014-06-12 NOTE — H&P (Signed)
PATIENT NAME:  Diane Patton, SHERRARD MR#:  [id number] DATE OF BIRTH:  May 21, 1945  DATE OF ADMISSION:  12/05/2013  REFERRING PHYSICIAN: Verdia Kuba. Paduchowski, MD   FAMILY PHYSICIAN: Glendon Axe, MD  REASON FOR ADMISSION: Acute respiratory failure.   HISTORY OF PRESENT ILLNESS: The patient is a 69 year old female with a history of multiple medical problems, including asthma, diabetes, benign hypertension and hyperlipidemia. She presents to the Emergency Room today with acute respiratory distress. In the Emergency Room, the patient was noted to be in severe distress and was placed on BiPAP. She was profoundly hypoxic. She has a history of asthma. She denies chest pain. She is now admitted for further evaluation.   PAST MEDICAL HISTORY:  1.  Chronic obstructive pulmonary disease/asthma.  2.  Osteoarthritis.  3.  Type 2 diabetes.  4.  Benign hypertension.  5.  Obesity.  6.  Hyperlipidemia.  7.  Osteoporosis.  8.  Environmental allergies.  9.  Status post breast surgery.  10.  History of hypomagnesemia.   MEDICATIONS:  1.  Norvasc 10 mg p.o. daily.  2.  Aspirin 81 mg p.o. daily.  3.  B12 1000 mcg p.o. daily.  4.  Lasix 40 mg p.o. daily.  5.  Amaryl 2 mg p.o. daily.  6.  Imdur 30 mg p.o. daily. 7.  Claritin 10 mg p.o. daily.  8.  Mag-Ox 400 mg p.o. b.i.d.  9.  Meloxicam 7.5 mg p.o. daily.  10.  Metformin 1000 mg p.o. b.i.d.  11.  Toprol-XL 100 mg p.o. daily.  12.  Singulair 10 mg p.o. daily.  13.  Omeprazole 20 mg p.o. daily.  14.  Klor-Con 10 mEq p.o. daily.  15.  Crestor 20 mg p.o. daily.  16.  Carafate 1 g p.o. before meals and at bedtime.  17.  Spiriva 1 capsule inhaled daily.  18.  Tramadol 50 mg p.o. q. 6 hours p.r.n.  19.  Diovan HCT 320/25 mg 1 p.o. daily.  20.  Vitamin D3, 1000 units p.o. daily.   ALLERGIES: LATEX, AMOXICILLIN, PENICILLIN AND ALLEGRA.   SOCIAL HISTORY: The patient is a former smoker, but quit in 1984. No history of alcohol abuse.   FAMILY HISTORY: Positive  for hypertension, heart disease, and Alzheimer dementia.   REVIEW OF SYSTEMS: Unable to obtain due to the patient's respiratory status and BiPAP use.   PHYSICAL EXAMINATION:  GENERAL: The patient is acutely ill-appearing, in severe respiratory distress.  VITAL SIGNS: Currently remarkable for a blood pressure of 116/76 with a heart rate of 92 and a respiratory rate of 24, saturations 97% on BiPAP.  HEENT: Normocephalic, atraumatic. Pupils equally round and reactive to light and accommodation. Extraocular movements are intact. Sclerae are non icteric. Conjunctivae are clear. Oropharynx is clear.  NECK: Supple without JVD. No adenopathy or thyromegaly is noted.  LUNGS: Diffuse rales and rhonchi. No obvious wheezes. Respiratory effort is increased. Decreased breath sounds throughout.  CARDIAC: Regular rate and rhythm. Normal S1, S2. No significant rubs or gallops. PMI is nondisplaced. Chest wall is nontender.  ABDOMEN: Soft, nontender, with normoactive bowel sounds. No organomegaly or masses were appreciated. No hernias or bruits were noted.  EXTREMITIES: Without clubbing, cyanosis or edema. Pulses were 2+ bilaterally.  SKIN: Warm and dry, without rash or lesions.  NEUROLOGIC: Cranial nerves II through XII grossly intact. Deep tendon reflexes were symmetric. Motor and sensory examination was nonfocal.  PSYCHIATRIC: Revealed a patient who appeared to be alert and oriented to person, place, and time. She was cooperative.  LABORATORY DATA: EKG revealed sinus tachycardia at 123 beats per minute with a left bundle branch block. Chest x-ray revealed cardiomegaly with CHF and pneumonia. Her Dimario count was elevated at 17.2 with a hemoglobin of 12.3. Glucose was 349 with a BUN of 17, creatinine 1.65 with a GFR of 40. Sodium 140 with a potassium of 3.6. Troponin was less than 0.02.   ASSESSMENT:  1.  Acute respiratory failure requiring BiPAP.  2.  Acute congestive heart failure, presumably diastolic.  3.   Acute asthma exacerbation.  4.  Pneumonia.  5.  Type 2 diabetes with hyperglycemia.  6.  Obesity.   PLAN: The patient will be admitted to the intensive care unit as a Full Code, according to her wishes. She will be maintained on BiPAP for now. We will begin IV steroids with IV antibiotics and DuoNeb SVNs. We will continue Advair, Singulair and Spiriva. We will consult pulmonology. In regard to her CHF, the patient was given IV Lasix in the Emergency Room. She will be maintained on p.o. Lasix b.i.d. We will follow serial cardiac enzymes and obtain an echocardiogram and a cardiology consult. We will follow her sugars with Accu-Cheks before meals and at bedtime, and add sliding scale insulin as needed. Continue most of her outpatient medications for now. Follow-up chest x-ray and labs in the morning. Wean oxygen as tolerated. Further treatment and evaluation will depend upon the patient's progress.   TOTAL TIME SPENT ON THIS PATIENT: 55 minutes   ____________________________ Leonie Douglas. Doy Hutching, MD jds:MT D: 12/05/2013 11:26:20 ET T: 12/05/2013 11:38:55 ET JOB#: [id number]  cc: Leonie Douglas. Doy Hutching, MD, <Dictator> Glendon Axe, MD Lovelle Lema Lennice Sites MD ELECTRONICALLY SIGNED 12/05/2013 13:22

## 2014-06-12 NOTE — Consult Note (Signed)
PATIENT NAME:  Diane Patton, Diane Patton MR#:  [id number] DATE OF BIRTH:  1945/11/13  DATE OF CONSULTATION:  12/05/2013  CONSULTING PHYSICIAN:  Isaias Cowman, MD  PRIMARY CARE PHYSICIAN:  Dr. Candiss Norse.   CHIEF COMPLAINT: Shortness of breath.   HISTORY OF PRESENT ILLNESS: The patient is a 69 year old female with history of asthma, hypertension, and probable chronic diastolic congestive heart failure. The patient reports that she was admitted with similar symptoms approximately 1 year ago. She presents with progressive shortness of breath, fever and productive cough. In the Emergency Room the patient was noted to be in respiratory distress and was treated with BiPAP.  A chest x-ray was performed which revealed evidence for possible right lower lobe pneumonia. The admission labs were notable for a negative CPK, isoenzymes and troponin. EKG revealed sinus tachycardia with left bundle branch block.   PAST MEDICAL HISTORY:  1.  COPD/asthma.  2.  Probable diastolic congestive heart failure.  3.  Hypertension.  4.  Type 2 diabetes.  5.  Hyperlipidemia.  6.  Obesity.   MEDICATIONS:  1.  Aspirin 81 mg daily.  2.  Amlodipine 10 mg daily.  3.  Furosemide 40 mg daily.  4.  Isosorbide mononitrate 30 mg daily.  5.  Metoprolol succinate 100 mg daily. 6.  Klor-Con 10 mEq daily.  7.  Crestor 20 mg daily.  8.  Diovan/HCT 320/25 daily.  9.  B12 1000 mcg daily.  10. Amaryl 2 mg daily.  11. Claritin 10 mg daily.  12. Magnesium oxide 400 mg b.i.d. 13. Meloxicam 7.5 mg daily.  14. Metformin 1000 mg b.i.d. 15. Singulair 10 mg daily.  16. Omeprazole 20 mg daily.  17. Carafate 1 gram a.c. and at bedtime.  18. Spiriva 1 capsule inhaled daily.  19. Tramadol 50 mg q. 6 hours p.r.n.  20. Vitamin D 3000 units daily.   SOCIAL HISTORY: The patient quit tobacco abuse in 1984.   FAMILY HISTORY: No immediate family history of coronary artery disease or myocardial infarction.    REVIEW OF SYSTEMS:   CONSTITUTIONAL: The  patient has had mild fever.  EYES: No blurry vision.  EARS: No hearing loss.  RESPIRATORY: Shortness of breath with cough.  CARDIOVASCULAR: The patient denies chest pain.  GASTROINTESTINAL: No nausea, vomiting, or diarrhea.  GENITOURINARY: No dysuria or hematuria.  ENDOCRINE: No polyuria or polydipsia.  MUSCULOSKELETAL: No arthralgias or myalgias.  NEUROLOGICAL: No focal muscle weakness or numbness.  PSYCHOLOGICAL: No depression or anxiety.   PHYSICAL EXAMINATION:  VITAL SIGNS: Blood pressure 133/62, pulse 86, respirations 22, temperature 97.9, pulse oximetry 96%.  HEENT: Pupils equal, reactive to light and accommodation.  NECK: Supple without thyromegaly.  LUNGS: Decreased breath sounds with expiratory wheezes.  CARDIOVASCULAR: Normal JVP. Normal PMI. Regular rate and rhythm. Normal S1, S2. No appreciable gallop, murmur, or rub.  ABDOMEN: Soft and nontender. Pulses were intact bilaterally.  MUSCULOSKELETAL: Normal muscle tone.  NEUROLOGIC: The patient is alert and oriented x 3. Motor and sensory are both grossly intact.   IMPRESSION: A 69 year old female with probable chronic obstructive pulmonary disease/asthma exacerbation with element of diastolic congestive heart failure. The patient shows initial clinical improvement after breathing treatments, intravenous steroids and BiPAP.   RECOMMENDATIONS:  1.  Agree with overall current therapy.  2.  Would defer full dose anticoagulation.  3.  Continue diuresis.  4.  Review 2-D echocardiogram.    ____________________________ Isaias Cowman, MD ap:lt D: 12/05/2013 13:26:41 ET T: 12/05/2013 15:10:53 ET JOB#: [id number]  cc: Isaias Cowman, MD, <Dictator>  Isaias Cowman MD ELECTRONICALLY SIGNED 12/06/2013 10:45

## 2014-06-12 NOTE — Discharge Summary (Signed)
Dates of Admission and Diagnosis:  Date of Admission 05-Dec-2013   Date of Discharge 08-Dec-2013   Admitting Diagnosis Acute respiratory failure, COPD exacerbation, acute on chronic CHF   Final Diagnosis Acute respiratory failure, COPD exacerbation, acute on chronic diastolic CHF   Discharge Diagnosis 1 Acute respiratory failure, COPD exacerbation, acute on chronic diastolic CHF    Chief Complaint/History of Present Illness 69 year old lady with h/o COPD and CHF presented to the ED with acute shortness of breath, noted to be tachycardic and hypoxic. Patient was placed on BiPAP for acute respiratory failure.   Allergies:  Penicillin: GI Distress  Amoxicillin: GI Distress  Allegra: Other  Aspirin: N/V/Diarrhea  Latex: Swelling, Itching    Hepatic:  17-Oct-15 09:21   Bilirubin, Total 0.5  Alkaline Phosphatase 71 (46-116 NOTE: New Reference Range 09/08/13)  SGPT (ALT) 30 (14-63 NOTE: New Reference Range 09/08/13)  SGOT (AST) 28  Total Protein, Serum 7.2  Albumin, Serum 3.5  Routine Micro:  17-Oct-15 11:03   Micro Text Report BLOOD CULTURE   COMMENT                   NO GROWTH IN 48 HOURS   ANTIBIOTIC                       Micro Text Report BLOOD CULTURE   COMMENT                   NO GROWTH IN 48 HOURS   ANTIBIOTIC                       Culture Comment NO GROWTH IN 48 HOURS  Result(s) reported on 07 Dec 2013 at 11:00AM.  Culture Comment NO GROWTH IN 48 HOURS  Result(s) reported on 07 Dec 2013 at 11:00AM.  Cardiology:  17-Oct-15 09:22   ECG interpretation Sinus tachycardia with Fusion complexes Possible Left atrial enlargement Left bundle branch block Abnormal ECG When compared with ECG of 12-Aug-2013 13:59, Fusion complexes are now Present Vent. rate has increased BY  41 BPM T wave inversion now evident in Inferior leads T wave inversion more evident in Lateral leads ----------unconfirmed---------- Confirmed by OVERREAD, NOT ([id number]), editor PEARSON, BARBARA  ([id number]) on 12/07/2013 11:41:44 AM  Routine Chem:  17-Oct-15 09:21   Creatinine (comp)  1.65  eGFR (African American)  40  eGFR (Non-African American)  33 (eGFR values <63m/min/1.73 m2 may be an indication of chronic kidney disease (CKD). Calculated eGFR, using the MRDR Study equation, is useful in  patients with stable renal function. The eGFR calculation will not be reliable in acutely ill patients when serum creatinine is changing rapidly. It is not useful in patients on dialysis. The eGFR calculation may not be applicable to patients at the low and high extremes of body sizes, pregnant women, and vetetarians.)  Glucose, Serum  349  BUN 17  Sodium, Serum 140  Potassium, Serum 3.6  Chloride, Serum 102  CO2, Serum  19  Calcium (Total), Serum 9.2  Anion Gap  19  Osmolality (calc) 295  Result Comment HGB/HCT - RESULTS VERIFIED BY REPEAT TESTING.  Result(s) reported on 05 Dec 2013 at 09:42AM.  B-Type Natriuretic Peptide ([facility]  2222 (Result(s) reported on 05 Dec 2013 at 09:54AM.)  18-Oct-15 05:47   Creatinine (comp) 1.26  20-Oct-15 03:50   Creatinine (comp) 1.22  Cardiac:  17-Oct-15 09:21   Troponin I < 0.02 (0.00-0.05 0.05 ng/mL or less: NEGATIVE  Repeat testing in 3-6 hrs  if clinically indicated. >0.05 ng/mL: POTENTIAL  MYOCARDIAL INJURY. Repeat  testing in 3-6 hrs if  clinically indicated. NOTE: An increase or decrease  of 30% or more on serial  testing suggests a  clinically important change)  CK, Total 86 (26-192 NOTE: NEW REFERENCE RANGE  03/23/2013)  CPK-MB, Serum 1.1 (Result(s) reported on 05 Dec 2013 at 09:54AM.)    13:22   Troponin I  0.13 (0.00-0.05 0.05 ng/mL or less: NEGATIVE  Repeat testing in 3-6 hrs  if clinically indicated. >0.05 ng/mL: POTENTIAL  MYOCARDIAL INJURY. Repeat  testing in 3-6 hrs if  clinically indicated. NOTE: An increase or decrease  of 30% or more on serial  testing suggests a  clinically important change)    17:28   Troponin  I  0.15 (0.00-0.05 0.05 ng/mL or less: NEGATIVE  Repeat testing in 3-6 hrs  if clinically indicated. >0.05 ng/mL: POTENTIAL  MYOCARDIAL INJURY. Repeat  testing in 3-6 hrs if  clinically indicated. NOTE: An increase or decrease  of 30% or more on serial  testing suggests a  clinically important change)  Routine Hem:  17-Oct-15 09:21   WBC (CBC)  17.2  RBC (CBC) 4.39  Hemoglobin (CBC) 12.3  Hematocrit (CBC) 41.6  Platelet Count (CBC)  460  MCV 95  MCH 28.0  MCHC  29.5  RDW  15.1  18-Oct-15 05:47   WBC (CBC)  11.2   PERTINENT RADIOLOGY STUDIES: XRay:    17-Oct-15 09:45, Chest Portable Single View  Chest Portable Single View   REASON FOR EXAM:    SOB, bipap  COMMENTS:       PROCEDURE: DXR - DXR PORTABLE CHEST SINGLE VIEW  - Dec 05 2013  9:45AM     CLINICAL DATA:  Short of breath, COPD, CHF, asthma and bronchitis.  Hypertension.    EXAM:  PORTABLE CHEST - 1 VIEW    COMPARISON:  09/04/2012    FINDINGS:  The heart size appears normal. Calcified atherosclerotic plaque  noted within the aortic arch. There is moderate interstitial edema  identified. Airspace consolidation in the right base is noted,  suspicious for superimposed pneumonia or asymmetric edema.     IMPRESSION:  1. CHF.  2. Right base opacity concerning for pneumonia.  3. Atherosclerotic disease.      Electronically Signed    By: Kerby Moors M.D.    On: 12/05/2013 10:24       Verified By: Angelita Ingles, M.D.,    18-Oct-15 06:22, Chest Portable Single View  Chest Portable Single View   REASON FOR EXAM:    SOB  COMMENTS:       PROCEDURE: DXR - DXR PORTABLE CHEST SINGLE VIEW  - Dec 06 2013  6:22AM     CLINICAL DATA:  Short of breath    EXAM:  PORTABLE CHEST - 1 VIEW    COMPARISON:  12/05/2013    FINDINGS:  Heart size appears mildly enlarged. Calcified atherosclerotic plaque  is noted within the aortic arch. There is been interval resolution  of pulmonary edema. Atelectasis noted in  the left base.     IMPRESSION:  1. Resolution of pulmonary edema.      Electronically Signed    EX:HBZJIR  Stroud M.D.    On: 12/06/2013 08:11         Verified By: Angelita Ingles, M.D.,   Pertinent Past History:  Pertinent Past History 1.  Chronic obstructive pulmonary disease/asthma.  2.  Osteoarthritis.  3.  Type 2 diabetes.  4.  Benign hypertension.  5.  Obesity.  6.  Hyperlipidemia.  7.  Osteoporosis.  8.  Environmental allergies.  9.  Status post breast surgery.  10.  History of hypomagnesemia.   Hospital Course:  Hospital Course Patient was admitted to ICU for acute respiratory failure. She was placed on BiPAP. Patient received IV steroids, antibiotics and nebulizer treatments for COPD exacerbation and possible right basilar pneumonia noted on CXR. Moderate interstitial edema was also noted on CXR. Patient received lasix for diuresis with close monitoring of I/Os and lytes. Patient was evaluated by cardiology for elevated troponins, felt to be secondary to demand supply ischemia. Respiratory status improved gradually and she was transferred to telemetry. Oxygenation improved. She was weaned off supplemental oxygen. Patient has done well without oxygen for over 24 hours. She is eager to go home. Exam: Alert oriented, NAD, Chest: clear, CVS: RRR, Abdomen: benign, Ext: no edema.   Condition on Discharge Stable   DISCHARGE INSTRUCTIONS HOME MEDS:  Medication Reconciliation: Patient's Home Medications at Discharge:     Medication Instructions  furosemide 40 mg oral tablet  40 milligram(s) orally once a day   loratadine 10 mg oral tablet  1 tab(s) orally once a day   omeprazole 20 mg oral delayed release capsule  1 cap(s) orally 2 times a day   multivitamin  0.5 tab(s) orally once a day   vitamin e 400 intl units oral capsule  1 cap(s) orally once a day   cod liver oil vitamin a and d oral capsule  1 cap(s) orally once a day   vitamin d3 1000 intl units oral capsule  1  cap(s) orally once a day   ketoconazole topical 2% topical cream  Apply topically to affected area once a day, As Needed   trazodone 100 mg oral tablet  0.5 tab(s) orally once a day (at bedtime)   ventolin cfc free 90 mcg/inh inhalation aerosol  2 puff(s) inhaled 4 times a day, As Needed - for Shortness of Breath   amlodipine 10 mg oral tablet  1 tab(s) orally once a day   aspirin 81 mg oral tablet, chewable  1 tab(s) orally once a day   advair diskus 250 mcg-50 mcg inhalation powder  1 puff(s) inhaled 2 times a day   vitamin c 500 mg oral tablet  1 tab(s) orally once a day   calcium 600+d 600 mg-200 units oral tablet  1 tab(s) orally once a day   imdur 30 mg oral tablet, extended release  1 tab(s) orally once a day (in the morning)   magnesium oxide 400 mg oral tablet  1 tab(s) orally 2 times a day   metoprolol extended release 100 mg oral tablet, extended release  1 tab(s) orally once a day   singulair 10 mg oral tablet  1 tab(s) orally once a day   potassium chloride 10 meq oral capsule, extended release  1 cap(s) orally once a day   crestor 20 mg oral tablet  1 tab(s) orally once a day   carafate 1 g oral tablet  1 tab(s) orally 4 times a day (before meals and at bedtime)   spiriva 18 mcg inhalation capsule  1 each inhaled once a day   tramadol 50 mg oral tablet  1 tab(s) orally 2 times a day, As Needed - for Pain   metformin 500 mg oral tablet  2 tab(s) orally 2 times a day   cyanocobalamin 500  mcg oral tablet  1 tab(s) orally once a day   hydrochlorothiazide-valsartan 25 mg-320 mg oral tablet  1 tab(s) orally once a day (at bedtime)   glimepiride 2 mg oral tablet  1 tab(s) orally once a day   meloxicam 7.5 mg oral tablet   orally 2 times a day   levofloxacin  750 milligram(s) orally every 48 hours   prednisone 20 mg oral tablet  2 tab(s) orally once a day    PRESCRIPTIONS: Faxed   Physician's Instructions:  Diet Low Sodium   Activity Limitations As tolerated   Return to Work  Not Applicable   Time frame for Follow Up Appointment 1-2 weeks  Dr. Glendon Axe   Time frame for Follow Up Appointment 1-2 weeks  [facility] cardiology   Time frame for Follow Up Appointment 1-2 weeks  pulmonary     Candiss Norse, Kaaliyah Kita(Attending Physician): [facility], [address] [place], [place] [address], [place]   Devona Konig A(Consultant): [facility], [address], [place], [place] [address], [place] [phone]   Paraschos, Alexander(Consultant): [facility], [address], [place], [place] [address], [place] [phone]  Electronic Signatures: Glendon Axe (MD)  (Signed 20-Oct-15 13:11)  Authored: ADMISSION DATE AND DIAGNOSIS, CHIEF COMPLAINT/HPI, Allergies, PERTINENT LABS, PERTINENT RADIOLOGY STUDIES, PERTINENT PAST HISTORY, HOSPITAL COURSE, DISCHARGE INSTRUCTIONS HOME MEDS, PATIENT INSTRUCTIONS, Follow Up Physician   Last Updated: 20-Oct-15 13:11 by Glendon Axe (MD)

## 2014-06-22 ENCOUNTER — Ambulatory Visit: Payer: Commercial Managed Care - HMO | Admitting: Family

## 2014-06-29 ENCOUNTER — Encounter: Payer: Self-pay | Admitting: Family

## 2014-06-29 ENCOUNTER — Ambulatory Visit: Payer: Commercial Managed Care - HMO | Attending: Family | Admitting: Family

## 2014-06-29 ENCOUNTER — Encounter (INDEPENDENT_AMBULATORY_CARE_PROVIDER_SITE_OTHER): Payer: Self-pay

## 2014-06-29 VITALS — BP 148/50 | HR 72 | Resp 20 | Ht 63.0 in | Wt 172.0 lb

## 2014-06-29 DIAGNOSIS — I209 Angina pectoris, unspecified: Secondary | ICD-10-CM | POA: Diagnosis not present

## 2014-06-29 DIAGNOSIS — J449 Chronic obstructive pulmonary disease, unspecified: Secondary | ICD-10-CM | POA: Diagnosis not present

## 2014-06-29 DIAGNOSIS — I10 Essential (primary) hypertension: Secondary | ICD-10-CM | POA: Diagnosis not present

## 2014-06-29 DIAGNOSIS — I509 Heart failure, unspecified: Secondary | ICD-10-CM | POA: Insufficient documentation

## 2014-06-29 DIAGNOSIS — R0602 Shortness of breath: Secondary | ICD-10-CM | POA: Insufficient documentation

## 2014-06-29 DIAGNOSIS — E119 Type 2 diabetes mellitus without complications: Secondary | ICD-10-CM | POA: Insufficient documentation

## 2014-06-29 DIAGNOSIS — I5022 Chronic systolic (congestive) heart failure: Secondary | ICD-10-CM

## 2014-06-29 DIAGNOSIS — R5383 Other fatigue: Secondary | ICD-10-CM | POA: Insufficient documentation

## 2014-06-29 DIAGNOSIS — J40 Bronchitis, not specified as acute or chronic: Secondary | ICD-10-CM | POA: Insufficient documentation

## 2014-06-29 MED ORDER — METOPROLOL SUCCINATE ER 100 MG PO TB24: 200.0000 mg | ORAL_TABLET | Freq: Every day | ORAL | Status: DC

## 2014-06-29 MED ORDER — ISOSORBIDE MONONITRATE ER 30 MG PO TB24: 30.0000 mg | ORAL_TABLET | Freq: Every day | ORAL | Status: DC

## 2014-06-29 MED ORDER — SUCRALFATE 1 G PO TABS: 1.0000 g | ORAL_TABLET | Freq: Four times a day (QID) | ORAL | Status: DC

## 2014-06-29 NOTE — Progress Notes (Signed)
Subjective:    Patient ID: Diane Patton, female    DOB: 1945/06/13, 69 y.o.   MRN: [id number]  Shortness of Breath This is a chronic problem. The current episode started more than 1 year ago. The problem occurs daily. The problem has been unchanged. Associated symptoms include chest pain ("been out of isosorbide for last 2 weeks"), headaches, rhinorrhea, sputum production (clear) and wheezing. Pertinent negatives include no abdominal pain, fever, leg swelling, neck pain, orthopnea, PND, sore throat or vomiting. The symptoms are aggravated by exercise and URIs. The patient has no known risk factors for DVT/PE. She has tried beta agonist inhalers, ipratropium inhalers and steroid inhalers for the symptoms. The treatment provided significant relief. Her past medical history is significant for COPD and a heart failure.  Chest Pain  This is a recurrent problem. The current episode started 1 to 4 weeks ago. The onset quality is gradual. The problem occurs intermittently. The problem has been unchanged. The pain is present in the epigastric region. The pain is at a severity of 3/10. The pain is mild. The quality of the pain is described as tightness. The pain does not radiate. Associated symptoms include a cough, headaches, shortness of breath and sputum production (clear). Pertinent negatives include no abdominal pain, back pain, dizziness, fever, nausea, orthopnea, palpitations, PND or vomiting. The cough is productive. There is no color change associated with the cough. The cough is relieved by a beta-agonist. Nothing worsens the cough. The treatment provided mild relief. Risk factors include being elderly.  Her past medical history is significant for COPD, CHF, diabetes and hypertension.  Cough This is a recurrent problem. The current episode started more than 1 month ago. The problem has been gradually improving. The problem occurs every few hours. The cough is productive of sputum. Associated symptoms  include chest pain ("been out of isosorbide for last 2 weeks"), headaches, postnasal drip, rhinorrhea, shortness of breath and wheezing. Pertinent negatives include no chills, fever or sore throat. Nothing aggravates the symptoms. Risk factors for lung disease include smoking/tobacco exposure. She has tried a beta-agonist inhaler and ipratropium inhaler for the symptoms. The treatment provided moderate relief. Her past medical history is significant for COPD.      Review of Systems  Constitutional: Positive for fatigue. Negative for fever and chills.  HENT: Positive for postnasal drip and rhinorrhea. Negative for congestion and sore throat.   Eyes: Negative.   Respiratory: Positive for cough, sputum production (clear), shortness of breath and wheezing.   Cardiovascular: Positive for chest pain ("been out of isosorbide for last 2 weeks"). Negative for palpitations, orthopnea, leg swelling and PND.  Gastrointestinal: Positive for constipation. Negative for nausea, vomiting and abdominal pain.  Endocrine: Negative.   Genitourinary: Negative.   Musculoskeletal: Positive for arthralgias (both shoulders). Negative for back pain and neck pain.  Skin: Negative.   Neurological: Positive for headaches. Negative for dizziness.  Hematological: Negative.   Psychiatric/Behavioral: Negative.        Objective:   Physical Exam  Constitutional: She is oriented to person, place, and time. She appears well-developed and well-nourished.  HENT:  Head: Normocephalic and atraumatic.  Eyes: Conjunctivae are normal. Pupils are equal, round, and reactive to light.  Neck: Normal range of motion. Neck supple.  Cardiovascular: Normal rate and regular rhythm.   Pulmonary/Chest: Effort normal and breath sounds normal.  Abdominal: Soft. There is no tenderness.  Musculoskeletal: She exhibits no edema or tenderness.  Neurological: She is alert and oriented to person, place,  and time.  Skin: Skin is warm and dry.   Psychiatric: She has a normal mood and affect. Her behavior is normal.          Assessment & Plan:  1: Chronic heart failure with reduced ejection fraction- Patient presents with stable symptoms at this time. Does endorse shortness of breath and fatigue with exertion but does improve upon rest. She says that she's recently planted 2 gardens and did so without much difficulty. Denies any shortness of breath upon walking into the office. Metoprolol had been increased to 200mg  last visit and she says that she has adjusted well to that. May increase her entresto at next visit as patient did not bring her medications with her today. She continues to weigh and reports a stable weight. She adds "a little" salt to baked potatoes and corn on the cob but, otherwise, doesn't add any. She is trying to closely follow a low sodium diet.  2: Anginal pain- She reports being out of her isosorbide for the last 2 weeks and her pain started at that time. She thought the pain may have been due to her coughing. Discussed the use of isosorbide and that her chest pain could certainly be coming from not having the medication. Refilled today and emphasized the importance of calling for medications so that she doesn't run out. Should her pain continue after resuming isosorbide, she should call her cardiologist who she is due to see in June 2016. 3: Bronchitis- Patient continues with an intermittent cough and has been treated with a zpack a few months ago. Sputum is clear and no wheezing heard today although patient says she does wheeze at times. Encouraged her to continue with her inhalers and followup with her PCP in June 2016.  Return in 3 months or sooner for any questions/problems before then.

## 2014-06-29 NOTE — Patient Instructions (Signed)
Continue to weigh daily and call for an overnight weight gain of >2 pounds or a weekly weight gain of >5 pounds.  Bring medications to every visit.

## 2014-08-05 ENCOUNTER — Other Ambulatory Visit: Payer: Self-pay | Admitting: Family

## 2014-08-05 MED ORDER — SACUBITRIL-VALSARTAN 49-51 MG PO TABS: 1.0000 | ORAL_TABLET | Freq: Two times a day (BID) | ORAL | Status: DC

## 2014-09-29 ENCOUNTER — Encounter: Payer: Self-pay | Admitting: Family

## 2014-09-29 ENCOUNTER — Ambulatory Visit: Payer: Commercial Managed Care - HMO | Attending: Family | Admitting: Family

## 2014-09-29 VITALS — BP 138/59 | HR 77 | Resp 20 | Ht 63.0 in | Wt 168.0 lb

## 2014-09-29 DIAGNOSIS — Z87891 Personal history of nicotine dependence: Secondary | ICD-10-CM | POA: Insufficient documentation

## 2014-09-29 DIAGNOSIS — E119 Type 2 diabetes mellitus without complications: Secondary | ICD-10-CM | POA: Insufficient documentation

## 2014-09-29 DIAGNOSIS — I10 Essential (primary) hypertension: Secondary | ICD-10-CM

## 2014-09-29 DIAGNOSIS — I5022 Chronic systolic (congestive) heart failure: Secondary | ICD-10-CM | POA: Diagnosis not present

## 2014-09-29 DIAGNOSIS — M1990 Unspecified osteoarthritis, unspecified site: Secondary | ICD-10-CM | POA: Insufficient documentation

## 2014-09-29 DIAGNOSIS — E785 Hyperlipidemia, unspecified: Secondary | ICD-10-CM | POA: Insufficient documentation

## 2014-09-29 DIAGNOSIS — G4733 Obstructive sleep apnea (adult) (pediatric): Secondary | ICD-10-CM | POA: Diagnosis not present

## 2014-09-29 DIAGNOSIS — M419 Scoliosis, unspecified: Secondary | ICD-10-CM | POA: Diagnosis not present

## 2014-09-29 DIAGNOSIS — J449 Chronic obstructive pulmonary disease, unspecified: Secondary | ICD-10-CM | POA: Diagnosis not present

## 2014-09-29 DIAGNOSIS — J410 Simple chronic bronchitis: Secondary | ICD-10-CM

## 2014-09-29 DIAGNOSIS — R05 Cough: Secondary | ICD-10-CM | POA: Diagnosis not present

## 2014-09-29 DIAGNOSIS — K219 Gastro-esophageal reflux disease without esophagitis: Secondary | ICD-10-CM | POA: Diagnosis not present

## 2014-09-29 DIAGNOSIS — Z886 Allergy status to analgesic agent status: Secondary | ICD-10-CM | POA: Insufficient documentation

## 2014-09-29 DIAGNOSIS — Z79899 Other long term (current) drug therapy: Secondary | ICD-10-CM | POA: Insufficient documentation

## 2014-09-29 NOTE — Patient Instructions (Signed)
Continue weighing daily and call for an overnight weight gain of > 2 pounds or a weekly weight gain of >5 pounds.  Finish current dose of Entrestro. When that bottle is finished, pick up new prescription at the pharmacy for the higher dose of Entresto of 97/103mg  and take that one twice daily.

## 2014-09-29 NOTE — Progress Notes (Signed)
Subjective:    Patient ID: Diane Patton, female    DOB: 08-05-1945, 69 y.o.   MRN: [id number]  Congestive Heart Failure Presents for follow-up visit. The disease course has been stable. Associated symptoms include fatigue. Pertinent negatives include no abdominal pain, chest pain, chest pressure, edema, orthopnea, palpitations or shortness of breath. The symptoms have been stable. Past treatments include angiotensin receptor blockers, beta blockers and salt and fluid restriction. The treatment provided moderate relief. Compliance with prior treatments has been good. Her past medical history is significant for DM and HTN. Compliance with total regimen is 76-100%.  Cough This is a chronic problem. The current episode started more than 1 month ago. The problem has been unchanged. The cough is productive of sputum. Associated symptoms include headaches, rhinorrhea and a sore throat. Pertinent negatives include no chest pain, fever, postnasal drip or shortness of breath. She has tried ipratropium inhaler, steroid inhaler and a beta-agonist inhaler for the symptoms. Her past medical history is significant for COPD.    Past Medical History  Diagnosis Date  . HTN (hypertension)   . Hyperlipidemia   . Osteoarthritis   . GERD (gastroesophageal reflux disease)   . Scoliosis   . Obstructive sleep apnea on CPAP   . CHF (congestive heart failure)   . Diabetes mellitus, type 2   . COPD (chronic obstructive pulmonary disease)     Past Surgical History  Procedure Laterality Date  . Ear (other)    . Stomach    . Breast surgery    . Feet (other)    . Sinus (other)    . Ankle (other)    . Appendectomy      Family History  Problem Relation Age of Onset  . Alzheimer's disease Other   . Heart attack Mother   . Hypertension Mother   . Cancer Brother     lung  . Cancer Maternal Aunt     mouth and breast  . Cancer Daughter     throat   Social History  Substance Use Topics  . Smoking status:  Former Smoker -- 1.00 packs/day for 15 years    Types: Cigarettes    Quit date: 07/21/1982  . Smokeless tobacco: Never Used  . Alcohol Use: 9.0 oz/week    15 Standard drinks or equivalent per week     Comment: quit drinking 04/1981    Allergies  Allergen Reactions  . Amoxicillin Other (See Comments)    GI distress  . Aspirin Diarrhea and Nausea And Vomiting  . Latex Itching and Swelling  . Penicillins Other (See Comments)    GI distress  . Allegra [Fexofenadine] Cough    Prior to Admission medications   Medication Sig Start Date End Date Taking? Authorizing Provider  albuterol (PROVENTIL HFA;VENTOLIN HFA) 108 (90 BASE) MCG/ACT inhaler Inhale 2 puffs into the lungs every 6 (six) hours as needed for wheezing or shortness of breath.   Yes Historical Provider, MD  amLODipine (NORVASC) 10 MG tablet Take 10 mg by mouth daily.   Yes Historical Provider, MD  aspirin 81 MG tablet Take 81 mg by mouth daily.   Yes Historical Provider, MD  Biotin 1000 MCG tablet Take 1,000 mcg by mouth 3 (three) times daily.   Yes Historical Provider, MD  calcium carbonate (OS-CAL) 600 MG TABS tablet Take 600 mg by mouth daily with breakfast.   Yes Historical Provider, MD  [facility] Liver Oil w/Vit A & D CAPS Take 1 capsule by mouth daily.  Yes Historical Provider, MD  cyanocobalamin 500 MCG tablet Take 500 mcg by mouth daily.   Yes Historical Provider, MD  Fluticasone-Salmeterol (ADVAIR) 250-50 MCG/DOSE AEPB Inhale 1 puff into the lungs 2 (two) times daily.   Yes Historical Provider, MD  furosemide (LASIX) 40 MG tablet Take 40 mg by mouth daily.   Yes Historical Provider, MD  glimepiride (AMARYL) 2 MG tablet Take 2 mg by mouth daily with breakfast.   Yes Historical Provider, MD  isosorbide mononitrate (IMDUR) 30 MG 24 hr tablet Take 1 tablet (30 mg total) by mouth daily. 06/29/14  Yes Alisa Graff, FNP  loratadine (CLARITIN) 10 MG tablet Take 10 mg by mouth daily.   Yes Historical Provider, MD  magnesium oxide  (MAG-OX) 400 MG tablet Take 400 mg by mouth 2 (two) times daily.   Yes Historical Provider, MD  meloxicam (MOBIC) 7.5 MG tablet Take 7.5 mg by mouth 2 (two) times daily.   Yes Historical Provider, MD  metFORMIN (GLUCOPHAGE) 500 MG tablet Take 1,000 mg by mouth 2 (two) times daily with a meal.   Yes Historical Provider, MD  metoprolol succinate (TOPROL-XL) 100 MG 24 hr tablet Take 2 tablets (200 mg total) by mouth daily. Take with or immediately following a meal. 06/29/14  Yes Alisa Graff, FNP  montelukast (SINGULAIR) 10 MG tablet Take 10 mg by mouth at bedtime.   Yes Historical Provider, MD  Multiple Vitamin (MULTIVITAMIN) tablet Take 0.5 tablets by mouth daily.   Yes Historical Provider, MD  omeprazole (PRILOSEC) 20 MG capsule Take 20 mg by mouth daily.   Yes Historical Provider, MD  potassium chloride (K-DUR) 10 MEQ tablet Take 10 mEq by mouth daily.   Yes Historical Provider, MD  rosuvastatin (CRESTOR) 20 MG tablet Take 20 mg by mouth daily.   Yes Historical Provider, MD  sacubitril-valsartan (ENTRESTO) 49-51 MG Take 1 tablet by mouth 2 (two) times daily. 08/05/14  Yes Alisa Graff, FNP  sucralfate (CARAFATE) 1 G tablet Take 1 tablet (1 g total) by mouth 4 (four) times daily. 06/29/14  Yes Alisa Graff, FNP  tiotropium (SPIRIVA) 18 MCG inhalation capsule Place 18 mcg into inhaler and inhale daily.   Yes Historical Provider, MD  traMADol-acetaminophen (ULTRACET) 37.5-325 MG per tablet Take 1 tablet by mouth every 6 (six) hours as needed for moderate pain.   Yes Historical Provider, MD  traZODone (DESYREL) 100 MG tablet Take 100 mg by mouth at bedtime.   Yes Historical Provider, MD  vitamin C (ASCORBIC ACID) 500 MG tablet Take 500 mg by mouth daily.   Yes Historical Provider, MD  vitamin E 400 UNIT capsule Take 400 Units by mouth daily.   Yes Historical Provider, MD     Review of Systems  Constitutional: Positive for fatigue. Negative for fever and appetite change.  HENT: Positive for  rhinorrhea and sore throat. Negative for congestion and postnasal drip.   Eyes: Negative.   Respiratory: Positive for cough. Negative for chest tightness and shortness of breath.   Cardiovascular: Negative for chest pain, palpitations and leg swelling.  Gastrointestinal: Negative for abdominal pain and abdominal distention.  Endocrine: Negative.   Genitourinary: Negative.   Musculoskeletal: Positive for back pain and arthralgias (left shoulder).  Skin: Negative.   Allergic/Immunologic: Negative.   Neurological: Positive for dizziness (when coughing hard) and headaches.  Hematological: Negative.   Psychiatric/Behavioral: Negative for sleep disturbance (sleeps on 2 pillows) and dysphoric mood. The patient is not nervous/anxious.  Objective:   Physical Exam  Constitutional: She is oriented to person, place, and time. She appears well-developed and well-nourished.  HENT:  Head: Normocephalic and atraumatic.  Eyes: Conjunctivae are normal. Pupils are equal, round, and reactive to light.  Neck: Normal range of motion. Neck supple.  Cardiovascular: Normal rate and regular rhythm.   Pulmonary/Chest: Effort normal. She has no wheezes. She has no rales.  Abdominal: Soft. She exhibits no distension. There is no tenderness.  Musculoskeletal: She exhibits no edema or tenderness.  Neurological: She is alert and oriented to person, place, and time.  Skin: Skin is warm and dry.  Psychiatric: She has a normal mood and affect. Her behavior is normal. Thought content normal.  Nursing note and vitals reviewed.   BP 138/59 mmHg  Pulse 77  Resp 20  Ht 5\' 3"  (1.6 m)  Wt 168 lb (76.204 kg)  BMI 29.77 kg/m2  SpO2 100%  LMP 06/20/1967 (Within Weeks)       Assessment & Plan:  1: Chronic heart failure with reduced ejection fraction- Patient presents with fatigue with exertion that is relieved upon rest. She continues to weigh herself daily and has noticed a gradual weight gain. By our scale,  she's lost 4 pounds since she was here last. Reminded to call for an overnight weight gain of >2 pounds or a weekly weight gain of >5 pounds. She continues to follow a low sodium diet and only adds salt to foods like corn on the cob and potatoes. Will increase her entresto to the 97/103mg  dose. She was instructed to finish out her current dosage and then begin the 97/103mg  dose. Will send in prescription next week as patient says that the pharmacy only keeps the prescription out for a week before putting it back on the shelf. Will check lab work at next visit if not done elsewhere.  2: HTN- Blood pressure looks pretty good today.  3: Diabetes- Patient says that her glucose this morning was 98 and she says that it tends to be less than 120 on a daily basis.  4: COPD- Patient says that she still has a productive cough of yellow/[place] sputum. She does say that she hasn't been using her inhalers daily and just as needed. Discussed the importance of using her inhalers daily and using her albuterol as needed.   Return in 1 month or sooner for any questions/problems before then.

## 2014-10-04 ENCOUNTER — Other Ambulatory Visit: Payer: Self-pay | Admitting: Family

## 2014-10-04 MED ORDER — SACUBITRIL-VALSARTAN 97-103 MG PO TABS: 1.0000 | ORAL_TABLET | Freq: Two times a day (BID) | ORAL | Status: DC

## 2014-11-01 ENCOUNTER — Ambulatory Visit: Payer: Commercial Managed Care - HMO | Attending: Family | Admitting: Family

## 2014-11-01 ENCOUNTER — Encounter: Payer: Self-pay | Admitting: Family

## 2014-11-01 VITALS — BP 122/47 | HR 76 | Resp 20 | Ht 63.0 in | Wt 171.0 lb

## 2014-11-01 DIAGNOSIS — J449 Chronic obstructive pulmonary disease, unspecified: Secondary | ICD-10-CM | POA: Insufficient documentation

## 2014-11-01 DIAGNOSIS — M1990 Unspecified osteoarthritis, unspecified site: Secondary | ICD-10-CM | POA: Insufficient documentation

## 2014-11-01 DIAGNOSIS — R059 Cough, unspecified: Secondary | ICD-10-CM

## 2014-11-01 DIAGNOSIS — Z79899 Other long term (current) drug therapy: Secondary | ICD-10-CM | POA: Insufficient documentation

## 2014-11-01 DIAGNOSIS — E119 Type 2 diabetes mellitus without complications: Secondary | ICD-10-CM

## 2014-11-01 DIAGNOSIS — G4733 Obstructive sleep apnea (adult) (pediatric): Secondary | ICD-10-CM | POA: Diagnosis not present

## 2014-11-01 DIAGNOSIS — I5022 Chronic systolic (congestive) heart failure: Secondary | ICD-10-CM

## 2014-11-01 DIAGNOSIS — K219 Gastro-esophageal reflux disease without esophagitis: Secondary | ICD-10-CM | POA: Diagnosis not present

## 2014-11-01 DIAGNOSIS — R05 Cough: Secondary | ICD-10-CM | POA: Insufficient documentation

## 2014-11-01 DIAGNOSIS — M419 Scoliosis, unspecified: Secondary | ICD-10-CM | POA: Diagnosis not present

## 2014-11-01 DIAGNOSIS — Z7982 Long term (current) use of aspirin: Secondary | ICD-10-CM | POA: Insufficient documentation

## 2014-11-01 DIAGNOSIS — Z87891 Personal history of nicotine dependence: Secondary | ICD-10-CM | POA: Diagnosis not present

## 2014-11-01 DIAGNOSIS — E785 Hyperlipidemia, unspecified: Secondary | ICD-10-CM | POA: Insufficient documentation

## 2014-11-01 DIAGNOSIS — I10 Essential (primary) hypertension: Secondary | ICD-10-CM | POA: Insufficient documentation

## 2014-11-01 LAB — BASIC METABOLIC PANEL
Anion gap: 8 (ref 5–15)
BUN: 18 mg/dL (ref 6–20)
CHLORIDE: 105 mmol/L (ref 101–111)
CO2: 25 mmol/L (ref 22–32)
Calcium: 9.4 mg/dL (ref 8.9–10.3)
Creatinine, Ser: 0.82 mg/dL (ref 0.44–1.00)
GFR calc Af Amer: >60 mL/min (ref >60)
GFR calc non Af Amer: >60 mL/min (ref >60)
Glucose, Bld: 90 mg/dL (ref 65–99)
POTASSIUM: 3.7 mmol/L (ref 3.5–5.1)
Sodium: 138 mmol/L (ref 135–145)

## 2014-11-01 NOTE — Progress Notes (Signed)
Subjective:    Patient ID: Diane Patton, female    DOB: Jul 06, 1945, 69 y.o.   MRN: [id number]  Congestive Heart Failure Presents for follow-up visit. The disease course has been stable. Associated symptoms include chest pain (only with coughing), fatigue, muscle weakness (legs) and shortness of breath (when coughing). Pertinent negatives include no abdominal pain, edema, orthopnea, palpitations or paroxysmal nocturnal dyspnea. The symptoms have been stable. The pain is at a severity of 5/10. The pain is mild. The pain is present in the substernal region. The quality of the pain is described as sharp. The pain does not radiate. Chest pain occurs with exertion. Past treatments include angiotensin receptor blockers, beta blockers and salt and fluid restriction. The treatment provided moderate relief. Compliance with prior treatments has been good. Her past medical history is significant for chronic lung disease, DM and HTN. She has one 1st degree relative with heart disease. Compliance with total regimen is 76-100%.  Cough This is a chronic problem. The current episode started more than 1 month ago. The problem has been unchanged. The cough is productive of sputum (usually clear). Associated symptoms include chest pain (only with coughing), headaches, postnasal drip, rhinorrhea, shortness of breath (when coughing) and wheezing. Pertinent negatives include no chills, ear pain, heartburn, rash or sore throat. The symptoms are aggravated by other (cigarette smoke and drainage in the back of the throat). Risk factors for lung disease include smoking/tobacco exposure. She has tried body position changes, ipratropium inhaler, leukotriene antagonists, OTC cough suppressant and steroid inhaler for the symptoms. The treatment provided mild relief. Her past medical history is significant for COPD and environmental allergies.    Past Medical History  Diagnosis Date  . HTN (hypertension)   . Hyperlipidemia   .  Osteoarthritis   . GERD (gastroesophageal reflux disease)   . Scoliosis   . Obstructive sleep apnea on CPAP   . CHF (congestive heart failure)   . Diabetes mellitus, type 2   . COPD (chronic obstructive pulmonary disease)     Past Surgical History  Procedure Laterality Date  . Ear (other)    . Stomach    . Breast surgery    . Feet (other)    . Sinus (other)    . Ankle (other)    . Appendectomy      Family History  Problem Relation Age of Onset  . Alzheimer's disease Other   . Heart attack Mother   . Hypertension Mother   . Cancer Brother     lung  . Cancer Maternal Aunt     mouth and breast  . Cancer Daughter     throat    Social History  Substance Use Topics  . Smoking status: Former Smoker -- 1.00 packs/day for 15 years    Types: Cigarettes    Quit date: 07/21/1982  . Smokeless tobacco: Never Used  . Alcohol Use: 9.0 oz/week    15 Standard drinks or equivalent per week     Comment: quit drinking 04/1981    Allergies  Allergen Reactions  . Amoxicillin Other (See Comments)    GI distress  . Aspirin Diarrhea and Nausea And Vomiting  . Celebrex [Celecoxib] Nausea Only  . Latex Itching and Swelling  . Penicillins Other (See Comments)    GI distress  . Allegra [Fexofenadine] Cough    Prior to Admission medications   Medication Sig Start Date End Date Taking? Authorizing Provider  albuterol (PROVENTIL HFA;VENTOLIN HFA) 108 (90 BASE) MCG/ACT inhaler Inhale 2  puffs into the lungs every 6 (six) hours as needed for wheezing or shortness of breath.   Yes Historical Provider, MD  amLODipine (NORVASC) 10 MG tablet Take 10 mg by mouth daily.   Yes Historical Provider, MD  aspirin 81 MG tablet Take 81 mg by mouth daily.   Yes Historical Provider, MD  Biotin 1000 MCG tablet Take 1,000 mcg by mouth daily.    Yes Historical Provider, MD  calcium carbonate (OS-CAL) 600 MG TABS tablet Take 600 mg by mouth daily with breakfast.   Yes Historical Provider, MD  Cholecalciferol  (D3-1000) 1000 UNITS capsule Take 1,000 Units by mouth daily.   Yes Historical Provider, MD  [facility] Liver Oil w/Vit A & D CAPS Take 1 capsule by mouth daily.   Yes Historical Provider, MD  cyanocobalamin 500 MCG tablet Take 500 mcg by mouth daily.   Yes Historical Provider, MD  Fluticasone-Salmeterol (ADVAIR) 250-50 MCG/DOSE AEPB Inhale 1 puff into the lungs 2 (two) times daily.   Yes Historical Provider, MD  furosemide (LASIX) 40 MG tablet Take 40 mg by mouth daily.   Yes Historical Provider, MD  glimepiride (AMARYL) 2 MG tablet Take 2 mg by mouth daily with breakfast.   Yes Historical Provider, MD  isosorbide mononitrate (IMDUR) 30 MG 24 hr tablet Take 1 tablet (30 mg total) by mouth daily. 06/29/14  Yes Alisa Graff, FNP  loratadine (CLARITIN) 10 MG tablet Take 10 mg by mouth daily.   Yes Historical Provider, MD  meloxicam (MOBIC) 7.5 MG tablet Take 7.5 mg by mouth 2 (two) times daily as needed for pain.    Yes Historical Provider, MD  metFORMIN (GLUCOPHAGE) 500 MG tablet Take 1,000 mg by mouth 2 (two) times daily with a meal.   Yes Historical Provider, MD  metoprolol succinate (TOPROL-XL) 100 MG 24 hr tablet Take 2 tablets (200 mg total) by mouth daily. Take with or immediately following a meal. 06/29/14  Yes Alisa Graff, FNP  montelukast (SINGULAIR) 10 MG tablet Take 10 mg by mouth at bedtime.   Yes Historical Provider, MD  Multiple Vitamin (MULTIVITAMIN) tablet Take 0.5 tablets by mouth daily.   Yes Historical Provider, MD  omeprazole (PRILOSEC) 20 MG capsule Take 20 mg by mouth 2 (two) times daily before a meal.    Yes Historical Provider, MD  potassium chloride (K-DUR) 10 MEQ tablet Take 10 mEq by mouth daily.   Yes Historical Provider, MD  rosuvastatin (CRESTOR) 20 MG tablet Take 20 mg by mouth daily.   Yes Historical Provider, MD  sacubitril-valsartan (ENTRESTO) 97-103 MG Take 1 tablet by mouth 2 (two) times daily. 10/04/14  Yes Alisa Graff, FNP  sucralfate (CARAFATE) 1 G tablet Take 1  tablet (1 g total) by mouth 4 (four) times daily. 06/29/14  Yes Alisa Graff, FNP  tiotropium (SPIRIVA) 18 MCG inhalation capsule Place 18 mcg into inhaler and inhale daily.   Yes Historical Provider, MD  traZODone (DESYREL) 100 MG tablet Take 100 mg by mouth at bedtime.   Yes Historical Provider, MD  vitamin C (ASCORBIC ACID) 500 MG tablet Take 500 mg by mouth daily.   Yes Historical Provider, MD  vitamin E 400 UNIT capsule Take 400 Units by mouth daily.   Yes Historical Provider, MD  magnesium oxide (MAG-OX) 400 MG tablet Take 400 mg by mouth 2 (two) times daily.    Historical Provider, MD  traMADol-acetaminophen (ULTRACET) 37.5-325 MG per tablet Take 1 tablet by mouth every 6 (six) hours  as needed for moderate pain.    Historical Provider, MD     Review of Systems  Constitutional: Positive for fatigue. Negative for chills and appetite change.  HENT: Positive for postnasal drip and rhinorrhea. Negative for congestion, ear pain and sore throat.   Eyes: Negative.   Respiratory: Positive for cough, shortness of breath (when coughing) and wheezing. Negative for chest tightness.   Cardiovascular: Positive for chest pain (only with coughing). Negative for palpitations and leg swelling.  Gastrointestinal: Negative for heartburn, abdominal pain and abdominal distention.  Endocrine: Negative.   Genitourinary: Negative.   Musculoskeletal: Positive for back pain, arthralgias (legs) and muscle weakness (legs).  Skin: Negative.  Negative for rash.  Allergic/Immunologic: Positive for environmental allergies. Negative for food allergies.  Neurological: Positive for dizziness, weakness (legs) and headaches. Negative for light-headedness.  Hematological: Negative for adenopathy. Does not bruise/bleed easily.  Psychiatric/Behavioral: Positive for sleep disturbance (sleeping on 2 pillows). Negative for dysphoric mood. The patient is not nervous/anxious.        Objective:   Physical Exam   Constitutional: She is oriented to person, place, and time. She appears well-developed and well-nourished.  HENT:  Head: Normocephalic and atraumatic.  Eyes: Conjunctivae are normal. Pupils are equal, round, and reactive to light.  Neck: Normal range of motion. Neck supple.  Cardiovascular: Normal rate and regular rhythm.   Pulmonary/Chest: Effort normal. She has wheezes (few) in the right upper field and the right lower field. She has no rales.  Abdominal: Soft. She exhibits no distension. There is no tenderness.  Musculoskeletal: She exhibits no edema or tenderness.  Neurological: She is alert and oriented to person, place, and time.  Skin: Skin is warm and dry.  Psychiatric: She has a normal mood and affect. Her behavior is normal. Thought content normal.  Nursing note and vitals reviewed.   BP 122/47 mmHg  Pulse 76  Resp 20  Ht 5\' 3"  (1.6 m)  Wt 171 lb (77.565 kg)  BMI 30.30 kg/m2  SpO2 100%  LMP 06/20/1967 (Within Weeks)       Assessment & Plan:  1: Chronic heart failure with reduced ejection fraction- Patient presents with fatigue upon exertion. She endorses some shortness of breath but says that it only occurs when she's coughing. She continues to weigh herself daily and says that she's gained a couple of pounds since she was here last. She does mention that her scale has recently broke so scales were given to her today. Reminded to call for an overnight weight gain of >2 pounds or a weekly weight gain of >5 pounds. She is trying to follow a low sodium diet and only adds salts to a few foods. She admits to not being very active at home and she's interested in cardiac rehab. Will send the referral in and a brochure was given to her as well so she could mention it to her cardiologist. Last echo was done Oct 2015 and she sees her cardiologist Nov 2016. She has tolerated the higher dose of Entresto (97/103mg ) so will check a basic metabolic panel today.  2: HTN- Blood pressure  looks good today. Since Delene Loll has been increased, will have patient stop her amlodipine when she finishes the bottle. She has approximately 2 weeks worth left in the bottle. Wrote on her bottle to stop when it's finished. She does check her blood pressure at home so she was instructed to call us if her blood pressure goes up.  3: Diabetes- She says that her  fasting glucose this morning was 87. Sees her PCP in Nov 2016 for followup. 4: Cough- Patient says that she's had this cough for over 6 months now. She feels like it's due to drainage in the back of her throat. Currently taking loratadine and she says that she's been taking it for "years". Discussed changing this to zyrtec but she would like to finish out the loratadine as she has a couple month's worth left of the medication. Slight wheezing heard. She says that the sputum she coughs up is clear in nature.   Return in 6 weeks or sooner for any questions/problems before the next office visit.

## 2014-11-01 NOTE — Patient Instructions (Signed)
Continue weighing daily and call for an overnight weight gain of > 2 pounds or a weekly weight gain of >5 pounds. 

## 2014-12-17 ENCOUNTER — Encounter: Payer: Self-pay | Admitting: Family

## 2014-12-17 ENCOUNTER — Ambulatory Visit: Payer: Commercial Managed Care - HMO | Attending: Family | Admitting: Family

## 2014-12-17 VITALS — BP 125/60 | HR 78 | Resp 20 | Ht 63.0 in | Wt 177.0 lb

## 2014-12-17 DIAGNOSIS — M1990 Unspecified osteoarthritis, unspecified site: Secondary | ICD-10-CM | POA: Diagnosis not present

## 2014-12-17 DIAGNOSIS — M419 Scoliosis, unspecified: Secondary | ICD-10-CM | POA: Diagnosis not present

## 2014-12-17 DIAGNOSIS — G4733 Obstructive sleep apnea (adult) (pediatric): Secondary | ICD-10-CM | POA: Diagnosis not present

## 2014-12-17 DIAGNOSIS — Z87891 Personal history of nicotine dependence: Secondary | ICD-10-CM | POA: Insufficient documentation

## 2014-12-17 DIAGNOSIS — J449 Chronic obstructive pulmonary disease, unspecified: Secondary | ICD-10-CM | POA: Insufficient documentation

## 2014-12-17 DIAGNOSIS — E785 Hyperlipidemia, unspecified: Secondary | ICD-10-CM | POA: Diagnosis not present

## 2014-12-17 DIAGNOSIS — I10 Essential (primary) hypertension: Secondary | ICD-10-CM | POA: Insufficient documentation

## 2014-12-17 DIAGNOSIS — K219 Gastro-esophageal reflux disease without esophagitis: Secondary | ICD-10-CM | POA: Diagnosis not present

## 2014-12-17 DIAGNOSIS — Z7982 Long term (current) use of aspirin: Secondary | ICD-10-CM | POA: Insufficient documentation

## 2014-12-17 DIAGNOSIS — I5022 Chronic systolic (congestive) heart failure: Secondary | ICD-10-CM | POA: Diagnosis not present

## 2014-12-17 DIAGNOSIS — Z7984 Long term (current) use of oral hypoglycemic drugs: Secondary | ICD-10-CM | POA: Diagnosis not present

## 2014-12-17 DIAGNOSIS — E119 Type 2 diabetes mellitus without complications: Secondary | ICD-10-CM

## 2014-12-17 DIAGNOSIS — Z79899 Other long term (current) drug therapy: Secondary | ICD-10-CM | POA: Insufficient documentation

## 2014-12-17 DIAGNOSIS — R05 Cough: Secondary | ICD-10-CM | POA: Diagnosis not present

## 2014-12-17 DIAGNOSIS — J410 Simple chronic bronchitis: Secondary | ICD-10-CM

## 2014-12-17 NOTE — Patient Instructions (Signed)
Continue weighing daily and call for an overnight weight gain of > 2 pounds or a weekly weight gain of >5 pounds. 

## 2014-12-17 NOTE — Progress Notes (Signed)
Subjective:    Patient ID: Diane Patton, female    DOB: Jul 15, 1945, 69 y.o.   MRN: [id number]  Congestive Heart Failure Presents for follow-up visit. The disease course has been stable. Associated symptoms include chest pain, chest pressure (tightness yesterday), fatigue and shortness of breath. Pertinent negatives include no abdominal pain, edema, orthopnea or palpitations. The symptoms have been stable. Past treatments include angiotensin receptor blockers, beta blockers and salt and fluid restriction. The treatment provided moderate relief. Compliance with prior treatments has been good. Her past medical history is significant for chronic lung disease, DM and HTN. She has one 1st degree relative with heart disease. Compliance with total regimen is 76-100%.  Chest Pain  This is a recurrent problem. The current episode started more than 1 year ago. The problem occurs intermittently. The problem has been unchanged. The pain is present in the substernal region. The pain is at a severity of 3/10. The pain is mild. The quality of the pain is described as tightness. The pain does not radiate. Associated symptoms include back pain, a cough, headaches and shortness of breath. Pertinent negatives include no abdominal pain, dizziness, fever, irregular heartbeat, numbness, palpitations or weakness. Risk factors include being elderly and lack of exercise.  Her past medical history is significant for COPD, CHF, diabetes, DM, hyperlipidemia, HTN and hypertension.  Her family medical history is significant for CAD and hypertension.  Cough This is a chronic problem. The current episode started more than 1 year ago. The problem has been unchanged. The problem occurs every few hours. The cough is non-productive. Associated symptoms include chest pain, headaches, nasal congestion, postnasal drip, rhinorrhea, shortness of breath and wheezing. Pertinent negatives include no fever or sore throat. Nothing aggravates the  symptoms. Risk factors for lung disease include smoking/tobacco exposure. She has tried a beta-agonist inhaler and ipratropium inhaler for the symptoms. The treatment provided mild relief. Her past medical history is significant for COPD.   Past Medical History  Diagnosis Date  . HTN (hypertension)   . Hyperlipidemia   . Osteoarthritis   . GERD (gastroesophageal reflux disease)   . Scoliosis   . Obstructive sleep apnea on CPAP   . CHF (congestive heart failure) ([place])   . Diabetes mellitus, type 2 ([place])   . COPD (chronic obstructive pulmonary disease) [facility])     Past Surgical History  Procedure Laterality Date  . Ear (other)    . Stomach    . Breast surgery    . Feet (other)    . Sinus (other)    . Ankle (other)    . Appendectomy      Family History  Problem Relation Age of Onset  . Alzheimer's disease Other   . Heart attack Mother   . Hypertension Mother   . Cancer Brother     lung  . Cancer Maternal Aunt     mouth and breast  . Cancer Daughter     throat    Social History  Substance Use Topics  . Smoking status: Former Smoker -- 1.00 packs/day for 15 years    Types: Cigarettes    Quit date: 07/21/1982  . Smokeless tobacco: Never Used  . Alcohol Use: 9.0 oz/week    15 Standard drinks or equivalent per week     Comment: quit drinking 04/1981    Allergies  Allergen Reactions  . Amoxicillin Other (See Comments)    GI distress  . Aspirin Diarrhea and Nausea And Vomiting  . Celebrex [Celecoxib] Nausea Only  .  Latex Itching and Swelling  . Penicillins Other (See Comments)    GI distress  . Allegra [Fexofenadine] Cough    Prior to Admission medications   Medication Sig Start Date End Date Taking? Authorizing Provider  albuterol (PROVENTIL HFA;VENTOLIN HFA) 108 (90 BASE) MCG/ACT inhaler Inhale 2 puffs into the lungs every 6 (six) hours as needed for wheezing or shortness of breath.   Yes Historical Provider, MD  aspirin 81 MG tablet Take 81 mg by mouth daily.    Yes Historical Provider, MD  Biotin 1000 MCG tablet Take 1,000 mcg by mouth daily.    Yes Historical Provider, MD  calcium carbonate (OS-CAL) 600 MG TABS tablet Take 600 mg by mouth daily with breakfast.   Yes Historical Provider, MD  Cholecalciferol (D3-1000) 1000 UNITS capsule Take 1,000 Units by mouth daily.   Yes Historical Provider, MD  [facility] Liver Oil w/Vit A & D CAPS Take 1 capsule by mouth daily.   Yes Historical Provider, MD  cyanocobalamin 500 MCG tablet Take 500 mcg by mouth daily.   Yes Historical Provider, MD  Fluticasone-Salmeterol (ADVAIR) 250-50 MCG/DOSE AEPB Inhale 1 puff into the lungs 2 (two) times daily.   Yes Historical Provider, MD  furosemide (LASIX) 40 MG tablet Take 40 mg by mouth daily.   Yes Historical Provider, MD  glimepiride (AMARYL) 2 MG tablet Take 2 mg by mouth daily with breakfast.   Yes Historical Provider, MD  isosorbide mononitrate (IMDUR) 30 MG 24 hr tablet Take 1 tablet (30 mg total) by mouth daily. 06/29/14  Yes Alisa Graff, FNP  loratadine (CLARITIN) 10 MG tablet Take 10 mg by mouth daily.   Yes Historical Provider, MD  magnesium oxide (MAG-OX) 400 MG tablet Take 400 mg by mouth 2 (two) times daily.   Yes Historical Provider, MD  meloxicam (MOBIC) 7.5 MG tablet Take 7.5 mg by mouth 2 (two) times daily as needed for pain.    Yes Historical Provider, MD  metFORMIN (GLUCOPHAGE) 500 MG tablet Take 1,000 mg by mouth 2 (two) times daily with a meal.   Yes Historical Provider, MD  metoprolol succinate (TOPROL-XL) 100 MG 24 hr tablet Take 2 tablets (200 mg total) by mouth daily. Take with or immediately following a meal. 06/29/14  Yes Alisa Graff, FNP  montelukast (SINGULAIR) 10 MG tablet Take 10 mg by mouth at bedtime.   Yes Historical Provider, MD  Multiple Vitamin (MULTIVITAMIN) tablet Take 0.5 tablets by mouth daily.   Yes Historical Provider, MD  omeprazole (PRILOSEC) 20 MG capsule Take 20 mg by mouth 2 (two) times daily before a meal.    Yes Historical  Provider, MD  potassium chloride (K-DUR) 10 MEQ tablet Take 10 mEq by mouth daily.   Yes Historical Provider, MD  rosuvastatin (CRESTOR) 20 MG tablet Take 20 mg by mouth daily.   Yes Historical Provider, MD  sacubitril-valsartan (ENTRESTO) 97-103 MG Take 1 tablet by mouth 2 (two) times daily. 10/04/14  Yes Alisa Graff, FNP  sucralfate (CARAFATE) 1 G tablet Take 1 tablet (1 g total) by mouth 4 (four) times daily. 06/29/14  Yes Alisa Graff, FNP  tiotropium (SPIRIVA) 18 MCG inhalation capsule Place 18 mcg into inhaler and inhale daily.   Yes Historical Provider, MD  traMADol-acetaminophen (ULTRACET) 37.5-325 MG per tablet Take 1 tablet by mouth every 6 (six) hours as needed for moderate pain.   Yes Historical Provider, MD  traZODone (DESYREL) 100 MG tablet Take 100 mg by mouth at bedtime.  Yes Historical Provider, MD  vitamin C (ASCORBIC ACID) 500 MG tablet Take 500 mg by mouth daily.   Yes Historical Provider, MD  vitamin E 400 UNIT capsule Take 400 Units by mouth daily.   Yes Historical Provider, MD      Review of Systems  Constitutional: Positive for fatigue. Negative for fever and appetite change.  HENT: Positive for congestion, postnasal drip and rhinorrhea. Negative for sore throat.   Eyes: Negative.   Respiratory: Positive for cough, chest tightness (yesterday), shortness of breath and wheezing.   Cardiovascular: Positive for chest pain. Negative for palpitations and leg swelling.  Gastrointestinal: Positive for abdominal distention (on occasion). Negative for abdominal pain.  Endocrine: Negative.   Genitourinary: Negative.   Musculoskeletal: Positive for back pain, arthralgias (knees) and neck stiffness. Negative for neck pain.  Skin: Negative.   Allergic/Immunologic: Negative.   Neurological: Positive for light-headedness (when coughing) and headaches. Negative for dizziness, weakness and numbness.  Hematological: Negative for adenopathy. Does not bruise/bleed easily.   Psychiatric/Behavioral: Positive for sleep disturbance (sleeping on 2 pillows; trouble sleeping due to cough). Negative for dysphoric mood. The patient is not nervous/anxious.        Objective:   Physical Exam  Constitutional: She is oriented to person, place, and time. She appears well-developed and well-nourished.  HENT:  Head: Normocephalic and atraumatic.  Eyes: Conjunctivae are normal. Pupils are equal, round, and reactive to light.  Neck: Normal range of motion. Neck supple.  Cardiovascular: Normal rate and regular rhythm.   Pulmonary/Chest: Effort normal. She has no wheezes. She has no rales.  Abdominal: Soft. She exhibits no distension. There is no tenderness.  Musculoskeletal: She exhibits no edema or tenderness.  Neurological: She is alert and oriented to person, place, and time.  Skin: Skin is warm and dry.  Psychiatric: She has a normal mood and affect. Her behavior is normal. Thought content normal.  Nursing note and vitals reviewed.  BP 125/60 mmHg  Pulse 78  Resp 20  Ht 5\' 3"  (1.6 m)  Wt 177 lb (80.287 kg)  BMI 31.36 kg/m2  SpO2 100%  LMP 06/20/1967 (Within Weeks)        Assessment & Plan:  1: Chronic heart failure with reduced ejection fraction- Patient presents with some fatigue and shortness of breath upon exertion. She says that she did get a little short of breath upon walking into the office today but once she sat down for a few minutes, her symptoms resolved. She continues to have a chronic cough and occasional chest heaviness upon exertion. She hasn't been weighing herself consistently because she says that her scales aren't working properly and she has to get a new set. By our scales, she's gained 6 pounds since she was here last. Reminded that when she gets her scales, to call for an overnight weight gain of >2 pounds or a weekly weight gain of >5 pounds. She is not adding any salt to her food and is trying to follow a low sodium diet. Amlodipine had been  stopped and her blood pressure looks good. 2: COPD- Patient says that she notices some wheezing at times along with a chronic cough. She says that she hasn't been using her spiriva much because she "doesn't feel like she's getting anything out of it". [facility] Pharm[facility] went in and reviewed medications with her and wrote down a different spiriva that is more like the traditional inhaler since patient is going to see her PCP on 12/21/14 and she can ask  her about changing it.  3: Diabetes- Glucose this morning was 86. Continue medications at this time.  Return in 3 months or sooner for any questions/problems before then.

## 2015-03-21 ENCOUNTER — Ambulatory Visit: Payer: Commercial Managed Care - HMO | Attending: Family | Admitting: Family

## 2015-03-21 ENCOUNTER — Encounter: Payer: Self-pay | Admitting: Family

## 2015-03-21 VITALS — BP 157/63 | HR 83 | Resp 20 | Ht 63.0 in | Wt 175.0 lb

## 2015-03-21 DIAGNOSIS — J449 Chronic obstructive pulmonary disease, unspecified: Secondary | ICD-10-CM | POA: Diagnosis not present

## 2015-03-21 DIAGNOSIS — E785 Hyperlipidemia, unspecified: Secondary | ICD-10-CM | POA: Diagnosis not present

## 2015-03-21 DIAGNOSIS — Z9889 Other specified postprocedural states: Secondary | ICD-10-CM | POA: Diagnosis not present

## 2015-03-21 DIAGNOSIS — Z79899 Other long term (current) drug therapy: Secondary | ICD-10-CM | POA: Diagnosis not present

## 2015-03-21 DIAGNOSIS — G4700 Insomnia, unspecified: Secondary | ICD-10-CM

## 2015-03-21 DIAGNOSIS — Z7984 Long term (current) use of oral hypoglycemic drugs: Secondary | ICD-10-CM | POA: Insufficient documentation

## 2015-03-21 DIAGNOSIS — M25562 Pain in left knee: Secondary | ICD-10-CM | POA: Insufficient documentation

## 2015-03-21 DIAGNOSIS — I5022 Chronic systolic (congestive) heart failure: Secondary | ICD-10-CM | POA: Diagnosis not present

## 2015-03-21 DIAGNOSIS — E119 Type 2 diabetes mellitus without complications: Secondary | ICD-10-CM

## 2015-03-21 DIAGNOSIS — K219 Gastro-esophageal reflux disease without esophagitis: Secondary | ICD-10-CM | POA: Insufficient documentation

## 2015-03-21 DIAGNOSIS — Z888 Allergy status to other drugs, medicaments and biological substances status: Secondary | ICD-10-CM | POA: Insufficient documentation

## 2015-03-21 DIAGNOSIS — M419 Scoliosis, unspecified: Secondary | ICD-10-CM | POA: Diagnosis not present

## 2015-03-21 DIAGNOSIS — I110 Hypertensive heart disease with heart failure: Secondary | ICD-10-CM | POA: Insufficient documentation

## 2015-03-21 DIAGNOSIS — Z880 Allergy status to penicillin: Secondary | ICD-10-CM | POA: Diagnosis not present

## 2015-03-21 DIAGNOSIS — Z7982 Long term (current) use of aspirin: Secondary | ICD-10-CM | POA: Insufficient documentation

## 2015-03-21 DIAGNOSIS — G4733 Obstructive sleep apnea (adult) (pediatric): Secondary | ICD-10-CM | POA: Diagnosis not present

## 2015-03-21 DIAGNOSIS — Z87891 Personal history of nicotine dependence: Secondary | ICD-10-CM | POA: Insufficient documentation

## 2015-03-21 DIAGNOSIS — I10 Essential (primary) hypertension: Secondary | ICD-10-CM

## 2015-03-21 NOTE — Patient Instructions (Signed)
Continue weighing daily and call for an overnight weight gain of > 2 pounds or a weekly weight gain of >5 pounds. 

## 2015-03-21 NOTE — Progress Notes (Signed)
Subjective:    Patient ID: Diane Patton, female    DOB: 05-11-1945, 70 y.o.   MRN: [id number]  Congestive Heart Failure Presents for follow-up visit. The disease course has been stable. Associated symptoms include fatigue. Pertinent negatives include no abdominal pain, chest pain, edema, orthopnea, palpitations, paroxysmal nocturnal dyspnea or shortness of breath. The symptoms have been stable. Past treatments include angiotensin receptor blockers, beta blockers and salt and fluid restriction. The treatment provided moderate relief. Compliance with prior treatments has been good. Her past medical history is significant for chronic lung disease, DM and HTN. She has one 1st degree relative with heart disease. Compliance with total regimen is 76-100%.  Hypertension This is a chronic problem. The current episode started more than 1 year ago. The problem is unchanged. The problem is controlled. Associated symptoms include malaise/fatigue. Pertinent negatives include no chest pain, headaches, palpitations, peripheral edema, PND or shortness of breath. There are no associated agents to hypertension. Risk factors for coronary artery disease include diabetes mellitus, dyslipidemia, family history and post-menopausal state. Past treatments include beta blockers.  Other This is a chronic (insomnia) problem. The current episode started more than 1 year ago. The problem occurs intermittently. The problem has been gradually worsening. Associated symptoms include arthralgias (left knee), coughing (little bit) and fatigue. Pertinent negatives include no abdominal pain, chest pain, congestion, fever, headaches, numbness, sore throat or weakness. Nothing aggravates the symptoms. She has tried nothing for the symptoms.   Past Medical History  Diagnosis Date  . HTN (hypertension)   . Hyperlipidemia   . Osteoarthritis   . GERD (gastroesophageal reflux disease)   . Scoliosis   . Obstructive sleep apnea on CPAP   . CHF  (congestive heart failure) ([place])   . Diabetes mellitus, type 2 ([place])   . COPD (chronic obstructive pulmonary disease) [facility])     Past Surgical History  Procedure Laterality Date  . Ear (other)    . Stomach    . Breast surgery    . Feet (other)    . Sinus (other)    . Ankle (other)    . Appendectomy      Family History  Problem Relation Age of Onset  . Alzheimer's disease Other   . Heart attack Mother   . Hypertension Mother   . Cancer Brother     lung  . Cancer Maternal Aunt     mouth and breast  . Cancer Daughter     throat    Social History  Substance Use Topics  . Smoking status: Former Smoker -- 1.00 packs/day for 15 years    Types: Cigarettes    Quit date: 07/21/1982  . Smokeless tobacco: Never Used  . Alcohol Use: 9.0 oz/week    15 Standard drinks or equivalent per week     Comment: quit drinking 04/1981    Allergies  Allergen Reactions  . Amoxicillin Other (See Comments)    GI distress  . Aspirin Diarrhea and Nausea And Vomiting  . Celebrex [Celecoxib] Nausea Only  . Latex Itching and Swelling  . Penicillins Other (See Comments)    GI distress  . Allegra [Fexofenadine] Cough    Prior to Admission medications   Medication Sig Start Date End Date Taking? Authorizing Provider  albuterol (PROVENTIL HFA;VENTOLIN HFA) 108 (90 BASE) MCG/ACT inhaler Inhale 2 puffs into the lungs every 6 (six) hours as needed for wheezing or shortness of breath.   Yes Historical Provider, MD  aspirin 81 MG tablet Take 81 mg  by mouth daily.   Yes Historical Provider, MD  Biotin 1000 MCG tablet Take 1,000 mcg by mouth daily.    Yes Historical Provider, MD  calcium carbonate (OS-CAL) 600 MG TABS tablet Take 600 mg by mouth daily with breakfast.   Yes Historical Provider, MD  Cholecalciferol (D3-1000) 1000 UNITS capsule Take 1,000 Units by mouth daily.   Yes Historical Provider, MD  [facility] Liver Oil w/Vit A & D CAPS Take 1 capsule by mouth daily.   Yes Historical Provider, MD   cyanocobalamin 500 MCG tablet Take 500 mcg by mouth daily.   Yes Historical Provider, MD  Fluticasone-Salmeterol (ADVAIR) 250-50 MCG/DOSE AEPB Inhale 1 puff into the lungs 2 (two) times daily.   Yes Historical Provider, MD  furosemide (LASIX) 40 MG tablet Take 40 mg by mouth daily.   Yes Historical Provider, MD  glimepiride (AMARYL) 2 MG tablet Take 2 mg by mouth daily with breakfast.   Yes Historical Provider, MD  isosorbide mononitrate (IMDUR) 30 MG 24 hr tablet Take 1 tablet (30 mg total) by mouth daily. 06/29/14  Yes Alisa Graff, FNP  loratadine (CLARITIN) 10 MG tablet Take 10 mg by mouth daily.   Yes Historical Provider, MD  magnesium oxide (MAG-OX) 400 MG tablet Take 400 mg by mouth 2 (two) times daily.   Yes Historical Provider, MD  meloxicam (MOBIC) 7.5 MG tablet Take 7.5 mg by mouth 2 (two) times daily as needed for pain.    Yes Historical Provider, MD  metFORMIN (GLUCOPHAGE) 500 MG tablet Take 1,000 mg by mouth 2 (two) times daily with a meal.   Yes Historical Provider, MD  metoprolol succinate (TOPROL-XL) 100 MG 24 hr tablet Take 2 tablets (200 mg total) by mouth daily. Take with or immediately following a meal. 06/29/14  Yes Alisa Graff, FNP  montelukast (SINGULAIR) 10 MG tablet Take 10 mg by mouth at bedtime.   Yes Historical Provider, MD  Multiple Vitamin (MULTIVITAMIN) tablet Take 0.5 tablets by mouth daily.   Yes Historical Provider, MD  potassium chloride (K-DUR) 10 MEQ tablet Take 10 mEq by mouth daily.   Yes Historical Provider, MD  rosuvastatin (CRESTOR) 20 MG tablet Take 20 mg by mouth daily.   Yes Historical Provider, MD  sacubitril-valsartan (ENTRESTO) 97-103 MG Take 1 tablet by mouth 2 (two) times daily. 10/04/14  Yes Alisa Graff, FNP  sucralfate (CARAFATE) 1 G tablet Take 1 tablet (1 g total) by mouth 4 (four) times daily. Patient taking differently: Take 1 g by mouth 2 (two) times daily.  06/29/14  Yes Alisa Graff, FNP  tiotropium (SPIRIVA) 18 MCG inhalation  capsule Place 18 mcg into inhaler and inhale daily.   Yes Historical Provider, MD  traMADol-acetaminophen (ULTRACET) 37.5-325 MG per tablet Take 1 tablet by mouth every 6 (six) hours as needed for moderate pain.   Yes Historical Provider, MD  traZODone (DESYREL) 100 MG tablet Take 100 mg by mouth at bedtime.   Yes Historical Provider, MD  vitamin C (ASCORBIC ACID) 500 MG tablet Take 500 mg by mouth daily.   Yes Historical Provider, MD  vitamin E 400 UNIT capsule Take 400 Units by mouth daily.   Yes Historical Provider, MD      Review of Systems  Constitutional: Positive for malaise/fatigue and fatigue. Negative for fever and appetite change.  HENT: Positive for rhinorrhea. Negative for congestion and sore throat.   Eyes: Negative.   Respiratory: Positive for cough (little bit) and chest tightness (with exertion  on occasion). Negative for shortness of breath.   Cardiovascular: Negative for chest pain, palpitations, leg swelling and PND.  Gastrointestinal: Negative for abdominal pain and abdominal distention.  Endocrine: Negative.   Genitourinary: Negative.   Musculoskeletal: Positive for back pain and arthralgias (left knee).  Skin: Negative.   Allergic/Immunologic: Negative.   Neurological: Negative for facial asymmetry, weakness, light-headedness, numbness and headaches.  Hematological: Negative for adenopathy. Does not bruise/bleed easily.  Psychiatric/Behavioral: Positive for sleep disturbance (sleeping on 2 pillows; trouble falling asleep). Negative for dysphoric mood. The patient is not nervous/anxious.        Objective:   Physical Exam  Constitutional: She is oriented to person, place, and time. She appears well-developed and well-nourished.  HENT:  Head: Normocephalic and atraumatic.  Eyes: Conjunctivae are normal. Pupils are equal, round, and reactive to light.  Neck: Normal range of motion. Neck supple.  Cardiovascular: Normal rate and regular rhythm.   Pulmonary/Chest:  Effort normal. She has no wheezes. She has no rales.  Abdominal: Soft. She exhibits no distension. There is no tenderness.  Musculoskeletal: She exhibits no edema or tenderness.  Neurological: She is alert and oriented to person, place, and time.  Skin: Skin is warm and dry.  Psychiatric: She has a normal mood and affect. Her behavior is normal. Thought content normal.  Nursing note and vitals reviewed.  BP 157/63 mmHg  Pulse 83  Resp 20  Ht 5\' 3"  (1.6 m)  Wt 175 lb (79.379 kg)  BMI 31.01 kg/m2  LMP 06/20/1967 (Within Weeks)        Assessment & Plan:  1: Chronic heart failure with reduced ejection fraction- Patient presents with fatigue upon exertion. She occasionally has some chest tightness upon exertion. She denies any shortness of breath or any edema. She continues to weigh herself daily and reports a stable weight. By our scale, she has lost 2 pounds since she was here last in October 2016. Reminded to call for an overnight weight gain of >2 pounds or a weekly weight gain of >5 pounds. She does add "a little" salt to her food and she was encouraged to not add any salt to her food. She does try to eat low sodium foods. She has received her flu vaccine for this season. [facility] PharmD went in and reviewed medications with the patient. Discussed stopping her isosorbide and beginning BiDil but she just got a 90 day supply filled at her pharmacy on 03/04/15 (verified by patient's pharmacy). Will plan on switching her to BiDil at her next office visit. Instructed patient to bring her medication bottles to each visit. 2: HTN- Blood pressure looks ok today.  3: Diabetes- She says that her fasting glucose this morning was 86. Follows closely with her PCP and is due to see her PCP on 03/24/15. 4: Insomnia- Patient says that she's having trouble falling asleep at night and feels like her body has gotten used to the sleeping medication that she takes. Encouraged her to speak with her PCP regarding  this.  Return here in 3 months or sooner for any questions/problems before then.

## 2015-04-03 ENCOUNTER — Other Ambulatory Visit: Payer: Self-pay | Admitting: Family

## 2015-05-23 ENCOUNTER — Other Ambulatory Visit: Payer: Self-pay | Admitting: Family

## 2015-05-23 ENCOUNTER — Telehealth: Payer: Self-pay | Admitting: Family

## 2015-05-23 MED ORDER — METOPROLOL SUCCINATE ER 200 MG PO TB24
200.0000 mg | ORAL_TABLET | Freq: Every day | ORAL | Status: DC
Start: 2015-05-23 — End: 2016-05-20

## 2015-05-23 NOTE — Telephone Encounter (Signed)
Received refill request for her metoprolol XL 100mg  that she's been taking 2 tablets daily. Called patient to see if she'd rather me call in the 200mg  tablet and then she would only have to take one tablet daily. She said she would rather do that so reinforced that I would be sending in the 200mg  tablet and when she picks it up, she will then only take 1 tablet daily.

## 2015-05-24 ENCOUNTER — Ambulatory Visit: Payer: Commercial Managed Care - HMO | Attending: Family | Admitting: Family

## 2015-05-24 ENCOUNTER — Encounter: Payer: Self-pay | Admitting: Family

## 2015-05-24 VITALS — BP 142/62 | HR 77 | Resp 18 | Ht 63.0 in | Wt 181.0 lb

## 2015-05-24 DIAGNOSIS — R0789 Other chest pain: Secondary | ICD-10-CM | POA: Insufficient documentation

## 2015-05-24 DIAGNOSIS — Z888 Allergy status to other drugs, medicaments and biological substances status: Secondary | ICD-10-CM | POA: Diagnosis not present

## 2015-05-24 DIAGNOSIS — Z79899 Other long term (current) drug therapy: Secondary | ICD-10-CM | POA: Diagnosis not present

## 2015-05-24 DIAGNOSIS — G4733 Obstructive sleep apnea (adult) (pediatric): Secondary | ICD-10-CM | POA: Insufficient documentation

## 2015-05-24 DIAGNOSIS — Z7984 Long term (current) use of oral hypoglycemic drugs: Secondary | ICD-10-CM | POA: Insufficient documentation

## 2015-05-24 DIAGNOSIS — Z880 Allergy status to penicillin: Secondary | ICD-10-CM | POA: Diagnosis not present

## 2015-05-24 DIAGNOSIS — M419 Scoliosis, unspecified: Secondary | ICD-10-CM | POA: Insufficient documentation

## 2015-05-24 DIAGNOSIS — J449 Chronic obstructive pulmonary disease, unspecified: Secondary | ICD-10-CM | POA: Diagnosis not present

## 2015-05-24 DIAGNOSIS — I10 Essential (primary) hypertension: Secondary | ICD-10-CM

## 2015-05-24 DIAGNOSIS — K219 Gastro-esophageal reflux disease without esophagitis: Secondary | ICD-10-CM | POA: Insufficient documentation

## 2015-05-24 DIAGNOSIS — E785 Hyperlipidemia, unspecified: Secondary | ICD-10-CM | POA: Diagnosis not present

## 2015-05-24 DIAGNOSIS — E119 Type 2 diabetes mellitus without complications: Secondary | ICD-10-CM | POA: Diagnosis not present

## 2015-05-24 DIAGNOSIS — Z7982 Long term (current) use of aspirin: Secondary | ICD-10-CM | POA: Diagnosis not present

## 2015-05-24 DIAGNOSIS — I5022 Chronic systolic (congestive) heart failure: Secondary | ICD-10-CM | POA: Diagnosis not present

## 2015-05-24 DIAGNOSIS — Z87891 Personal history of nicotine dependence: Secondary | ICD-10-CM | POA: Diagnosis not present

## 2015-05-24 MED ORDER — ISOSORB DINITRATE-HYDRALAZINE 20-37.5 MG PO TABS: 1.0000 | ORAL_TABLET | Freq: Three times a day (TID) | ORAL | Status: DC

## 2015-05-24 NOTE — Progress Notes (Signed)
Subjective:    Patient ID: Diane Patton, female    DOB: [birth year], 70 y.o.   MRN: [id number]  Congestive Heart Failure Presents for follow-up visit. The disease course has been improving. Associated symptoms include chest pressure (couple of days ago), fatigue and shortness of breath. Pertinent negatives include no abdominal pain, chest pain, edema, muscle weakness, orthopnea or palpitations. The symptoms have been improving. Past treatments include angiotensin receptor blockers, beta blockers and salt and fluid restriction. The treatment provided moderate relief. Compliance with prior treatments has been good. Her past medical history is significant for chronic lung disease, DM and HTN. She has one 1st degree relative with heart disease.  Hypertension This is a chronic problem. The current episode started more than 1 year ago. The problem is unchanged. The problem is controlled. Associated symptoms include shortness of breath. Pertinent negatives include no chest pain, headaches, neck pain, palpitations or peripheral edema. There are no associated agents to hypertension. Risk factors for coronary artery disease include diabetes mellitus, dyslipidemia, family history, post-menopausal state and sedentary lifestyle. Past treatments include beta blockers, angiotensin blockers, diuretics and lifestyle changes. The current treatment provides moderate improvement. Compliance problems include exercise.  Hypertensive end-organ damage includes heart failure.   Past Medical History  Diagnosis Date  . HTN (hypertension)   . Hyperlipidemia   . Osteoarthritis   . GERD (gastroesophageal reflux disease)   . Scoliosis   . Obstructive sleep apnea on CPAP   . CHF (congestive heart failure) ([place])   . Diabetes mellitus, type 2 ([place])   . COPD (chronic obstructive pulmonary disease) [facility])     Past Surgical History  Procedure Laterality Date  . Ear (other)    . Stomach    . Breast surgery    . Feet (other)     . Sinus (other)    . Ankle (other)    . Appendectomy     Family History  Problem Relation Age of Onset  . Alzheimer's disease Other   . Heart attack Mother   . Hypertension Mother   . Cancer Brother     lung  . Cancer Maternal Aunt     mouth and breast  . Cancer Daughter     throat    Social History  Substance Use Topics  . Smoking status: Former Smoker -- 1.00 packs/day for 15 years    Types: Cigarettes    Quit date: 07/21/1982  . Smokeless tobacco: Never Used  . Alcohol Use: 9.0 oz/week    15 Standard drinks or equivalent per week     Comment: quit drinking 04/1981    Allergies  Allergen Reactions  . Amoxicillin Other (See Comments)    GI distress  . Aspirin Diarrhea and Nausea And Vomiting  . Celebrex [Celecoxib] Nausea Only  . Latex Itching and Swelling  . Penicillins Other (See Comments)    GI distress  . Allegra [Fexofenadine] Cough    Prior to Admission medications   Medication Sig Start Date End Date Taking? Authorizing Provider  albuterol (PROVENTIL HFA;VENTOLIN HFA) 108 (90 BASE) MCG/ACT inhaler Inhale 2 puffs into the lungs every 6 (six) hours as needed for wheezing or shortness of breath.   Yes Historical Provider, MD  aspirin 81 MG tablet Take 81 mg by mouth daily.   Yes Historical Provider, MD  Biotin 1000 MCG tablet Take 1,000 mcg by mouth daily.    Yes Historical Provider, MD  cetirizine (ZYRTEC) 10 MG tablet Take 10 mg by mouth daily.  Yes Historical Provider, MD  Cholecalciferol (D3-1000) 1000 UNITS capsule Take 5,000 Units by mouth daily.    Yes Historical Provider, MD  [facility] Liver Oil w/Vit A & D CAPS Take 1 capsule by mouth daily.   Yes Historical Provider, MD  cyanocobalamin 500 MCG tablet Take 500 mcg by mouth daily.   Yes Historical Provider, MD  ENTRESTO 97-103 MG TAKE ONE TABLET BY MOUTH TWICE DAILY 04/04/15  Yes Alisa Graff, FNP  Fluticasone-Salmeterol (ADVAIR) 250-50 MCG/DOSE AEPB Inhale 1 puff into the lungs 2 (two) times daily.   Yes  Historical Provider, MD  furosemide (LASIX) 40 MG tablet Take 40 mg by mouth daily.   Yes Historical Provider, MD  glimepiride (AMARYL) 2 MG tablet Take 2 mg by mouth daily with breakfast.   Yes Historical Provider, MD  magnesium oxide (MAG-OX) 400 MG tablet Take 800 mg by mouth 3 (three) times daily.    Yes Historical Provider, MD  meloxicam (MOBIC) 7.5 MG tablet Take 7.5 mg by mouth 2 (two) times daily as needed for pain.    Yes Historical Provider, MD  metFORMIN (GLUCOPHAGE) 500 MG tablet Take 1,000 mg by mouth 2 (two) times daily with a meal.   Yes Historical Provider, MD  metoprolol succinate (TOPROL-XL) 200 MG 24 hr tablet Take 1 tablet (200 mg total) by mouth daily. Take with or immediately following a meal. 05/23/15  Yes Alisa Graff, FNP  montelukast (SINGULAIR) 10 MG tablet Take 10 mg by mouth at bedtime.   Yes Historical Provider, MD  Multiple Vitamin (MULTIVITAMIN) tablet Take 0.5 tablets by mouth daily.   Yes Historical Provider, MD  omeprazole (PRILOSEC) 20 MG capsule Take 20 mg by mouth 2 (two) times daily before a meal.   Yes Historical Provider, MD  potassium chloride (K-DUR) 10 MEQ tablet Take 10 mEq by mouth daily.   Yes Historical Provider, MD  rosuvastatin (CRESTOR) 20 MG tablet Take 20 mg by mouth daily.   Yes Historical Provider, MD  sucralfate (CARAFATE) 1 G tablet Take 1 tablet (1 g total) by mouth 4 (four) times daily. Patient taking differently: Take 1 g by mouth 2 (two) times daily.  06/29/14  Yes Alisa Graff, FNP  traMADol-acetaminophen (ULTRACET) 37.5-325 MG per tablet Take 1 tablet by mouth every 6 (six) hours as needed for moderate pain.   Yes Historical Provider, MD  traZODone (DESYREL) 100 MG tablet Take 100 mg by mouth at bedtime.   Yes Historical Provider, MD  vitamin C (ASCORBIC ACID) 500 MG tablet Take 500 mg by mouth daily.   Yes Historical Provider, MD  vitamin E 400 UNIT capsule Take 400 Units by mouth daily.   Yes Historical Provider, MD   isosorbide-hydrALAZINE (BIDIL) 20-37.5 MG tablet Take 1 tablet by mouth 3 (three) times daily. 05/24/15   Alisa Graff, FNP      Review of Systems  Constitutional: Positive for fatigue. Negative for appetite change.  HENT: Positive for congestion. Negative for postnasal drip and sore throat.   Eyes: Negative.   Respiratory: Positive for cough and shortness of breath. Negative for chest tightness.   Cardiovascular: Negative for chest pain, palpitations and leg swelling.  Gastrointestinal: Negative for abdominal pain and abdominal distention.  Endocrine: Negative.   Genitourinary: Negative.   Musculoskeletal: Positive for back pain. Negative for muscle weakness and neck pain.  Skin: Negative.   Allergic/Immunologic: Negative.   Neurological: Negative for dizziness, light-headedness and headaches.  Hematological: Negative for adenopathy. Does not bruise/bleed easily.  Psychiatric/Behavioral: Negative for sleep disturbance (sleeping on 2 pillows) and dysphoric mood. The patient is not nervous/anxious.        Objective:   Physical Exam  Constitutional: She is oriented to person, place, and time. She appears well-developed and well-nourished.  HENT:  Head: Normocephalic and atraumatic.  Eyes: Conjunctivae are normal. Pupils are equal, round, and reactive to light.  Neck: Normal range of motion. Neck supple.  Cardiovascular: Normal rate and regular rhythm.   Pulmonary/Chest: Effort normal. She has no wheezes. She has no rales.  Abdominal: Soft. She exhibits no distension. There is no tenderness.  Musculoskeletal: She exhibits no edema or tenderness.  Neurological: She is alert and oriented to person, place, and time.  Skin: Skin is warm and dry.  Psychiatric: She has a normal mood and affect. Her behavior is normal. Thought content normal.  Nursing note and vitals reviewed.   BP 142/62 mmHg  Pulse 77  Resp 18  Ht 5\' 3"  (1.6 m)  Wt 181 lb (82.101 kg)  BMI 32.07 kg/m2  SpO2  100%  LMP 06/20/1967 (Within Weeks)       Assessment & Plan:  1: Chronic heart failure with reduced ejection fraction- Patient presents with fatigue and shortness of breath upon exertion which does improve upon rest. Denies any swelling in her legs. She continues to weigh herself daily and says that her weight has gradually gone up. By our scale, she's gained 6 pounds since she was last here. She says that she doesn't eat much but admits to not getting any exercise. Encouraged her to increase her activity to get her metabolism up. Reminded her to call for an overnight weight gain of >2 pounds or a weekly weight gain of >5 pounds. She is not adding any salt to her food and tries to eat low sodium foods. She has a couple weeks worth of isosorbide left in her pill box and then her bottle is empty. Patient instructed that I wasn't going to refill that as we are going to begin Bidil by taking 1 tablet three times daily. Written instructions were provided regarding this change and patient also verbalized understanding. [facility] PharmD went in and reviewed medications with the patient. She sees her cardiologist 07/19/15. 2: HTN- Blood pressure looks good today. 3: Diabetes- She says that her glucose this morning was 116. She sees her PCP May 2017.  Return here in 1 month or sooner for any questions/problems before then.

## 2015-05-24 NOTE — Patient Instructions (Addendum)
Continue weighing daily and call for an overnight weight gain of > 2 pounds or a weekly weight gain of >5 pounds.  Finish taking the isosorbide in your pill box. When you finish it, you will begin the BiDil as three times daily.    As a reminder, when you pick up the metoprolol dose, you will be getting the 200mg  tablet and you will then only take 1 of those daily.

## 2015-06-21 ENCOUNTER — Ambulatory Visit: Payer: Commercial Managed Care - HMO | Admitting: Family

## 2015-06-28 ENCOUNTER — Ambulatory Visit: Payer: Commercial Managed Care - HMO | Admitting: Family

## 2015-07-07 ENCOUNTER — Encounter: Payer: Self-pay | Admitting: Family

## 2015-07-07 ENCOUNTER — Ambulatory Visit: Payer: Commercial Managed Care - HMO | Attending: Family | Admitting: Family

## 2015-07-07 VITALS — BP 133/54 | HR 74 | Resp 18 | Ht 63.0 in | Wt 175.0 lb

## 2015-07-07 DIAGNOSIS — Z8249 Family history of ischemic heart disease and other diseases of the circulatory system: Secondary | ICD-10-CM | POA: Diagnosis not present

## 2015-07-07 DIAGNOSIS — G4733 Obstructive sleep apnea (adult) (pediatric): Secondary | ICD-10-CM | POA: Insufficient documentation

## 2015-07-07 DIAGNOSIS — R5383 Other fatigue: Secondary | ICD-10-CM | POA: Diagnosis not present

## 2015-07-07 DIAGNOSIS — Z882 Allergy status to sulfonamides status: Secondary | ICD-10-CM | POA: Insufficient documentation

## 2015-07-07 DIAGNOSIS — Z7984 Long term (current) use of oral hypoglycemic drugs: Secondary | ICD-10-CM | POA: Insufficient documentation

## 2015-07-07 DIAGNOSIS — E119 Type 2 diabetes mellitus without complications: Secondary | ICD-10-CM | POA: Diagnosis not present

## 2015-07-07 DIAGNOSIS — Z9889 Other specified postprocedural states: Secondary | ICD-10-CM | POA: Insufficient documentation

## 2015-07-07 DIAGNOSIS — Z801 Family history of malignant neoplasm of trachea, bronchus and lung: Secondary | ICD-10-CM | POA: Diagnosis not present

## 2015-07-07 DIAGNOSIS — E785 Hyperlipidemia, unspecified: Secondary | ICD-10-CM | POA: Insufficient documentation

## 2015-07-07 DIAGNOSIS — M1990 Unspecified osteoarthritis, unspecified site: Secondary | ICD-10-CM | POA: Insufficient documentation

## 2015-07-07 DIAGNOSIS — Z803 Family history of malignant neoplasm of breast: Secondary | ICD-10-CM | POA: Insufficient documentation

## 2015-07-07 DIAGNOSIS — M419 Scoliosis, unspecified: Secondary | ICD-10-CM | POA: Insufficient documentation

## 2015-07-07 DIAGNOSIS — Z87891 Personal history of nicotine dependence: Secondary | ICD-10-CM | POA: Insufficient documentation

## 2015-07-07 DIAGNOSIS — Z7982 Long term (current) use of aspirin: Secondary | ICD-10-CM | POA: Insufficient documentation

## 2015-07-07 DIAGNOSIS — I10 Essential (primary) hypertension: Secondary | ICD-10-CM

## 2015-07-07 DIAGNOSIS — Z818 Family history of other mental and behavioral disorders: Secondary | ICD-10-CM | POA: Diagnosis not present

## 2015-07-07 DIAGNOSIS — K219 Gastro-esophageal reflux disease without esophagitis: Secondary | ICD-10-CM | POA: Diagnosis not present

## 2015-07-07 DIAGNOSIS — Z91040 Latex allergy status: Secondary | ICD-10-CM | POA: Insufficient documentation

## 2015-07-07 DIAGNOSIS — Z886 Allergy status to analgesic agent status: Secondary | ICD-10-CM | POA: Insufficient documentation

## 2015-07-07 DIAGNOSIS — I5022 Chronic systolic (congestive) heart failure: Secondary | ICD-10-CM | POA: Insufficient documentation

## 2015-07-07 DIAGNOSIS — Z880 Allergy status to penicillin: Secondary | ICD-10-CM | POA: Diagnosis not present

## 2015-07-07 DIAGNOSIS — Z808 Family history of malignant neoplasm of other organs or systems: Secondary | ICD-10-CM | POA: Insufficient documentation

## 2015-07-07 DIAGNOSIS — J449 Chronic obstructive pulmonary disease, unspecified: Secondary | ICD-10-CM | POA: Insufficient documentation

## 2015-07-07 DIAGNOSIS — I110 Hypertensive heart disease with heart failure: Secondary | ICD-10-CM | POA: Diagnosis not present

## 2015-07-07 DIAGNOSIS — Z79899 Other long term (current) drug therapy: Secondary | ICD-10-CM | POA: Insufficient documentation

## 2015-07-07 DIAGNOSIS — Z79891 Long term (current) use of opiate analgesic: Secondary | ICD-10-CM | POA: Insufficient documentation

## 2015-07-07 MED ORDER — POTASSIUM CHLORIDE ER 10 MEQ PO TBCR
10.0000 meq | EXTENDED_RELEASE_TABLET | Freq: Every day | ORAL | Status: DC
Start: 2015-07-07 — End: 2015-10-07

## 2015-07-07 NOTE — Progress Notes (Signed)
Subjective:    Patient ID: Diane Patton, female    DOB: 05-16-1945, 70 y.o.   MRN: [id number]  Congestive Heart Failure Presents for follow-up visit. The disease course has been stable. Associated symptoms include fatigue (getting better). Pertinent negatives include no abdominal pain, chest pain, edema, orthopnea, palpitations or shortness of breath. The symptoms have been stable. Past treatments include angiotensin receptor blockers, beta blockers and salt and fluid restriction. The treatment provided significant relief. Compliance with prior treatments has been good. Her past medical history is significant for chronic lung disease, DM and HTN. She has one 1st degree relative with heart disease.  Hypertension Pertinent negatives include no chest pain, headaches, neck pain, palpitations or shortness of breath. There are no associated agents to hypertension. Risk factors for coronary artery disease include diabetes mellitus, dyslipidemia and family history. Past treatments include angiotensin blockers, beta blockers, diuretics and lifestyle changes. The current treatment provides significant improvement. There are no compliance problems.  Hypertensive end-organ damage includes heart failure. There is no history of CVA.   Past Medical History  Diagnosis Date  . HTN (hypertension)   . Hyperlipidemia   . Osteoarthritis   . GERD (gastroesophageal reflux disease)   . Scoliosis   . Obstructive sleep apnea on CPAP   . CHF (congestive heart failure) ([place])   . Diabetes mellitus, type 2 ([place])   . COPD (chronic obstructive pulmonary disease) [facility])     Past Surgical History  Procedure Laterality Date  . Ear (other)    . Stomach    . Breast surgery    . Feet (other)    . Sinus (other)    . Ankle (other)    . Appendectomy      Family History  Problem Relation Age of Onset  . Alzheimer's disease Other   . Heart attack Mother   . Hypertension Mother   . Cancer Brother     lung  . Cancer  Maternal Aunt     mouth and breast  . Cancer Daughter     throat    Social History  Substance Use Topics  . Smoking status: Former Smoker -- 1.00 packs/day for 15 years    Types: Cigarettes    Quit date: 07/21/1982  . Smokeless tobacco: Never Used  . Alcohol Use: 9.0 oz/week    15 Standard drinks or equivalent per week     Comment: quit drinking 04/1981    Allergies  Allergen Reactions  . Amoxicillin Other (See Comments)    GI distress  . Aspirin Diarrhea and Nausea And Vomiting  . Celebrex [Celecoxib] Nausea Only  . Latex Itching and Swelling  . Penicillins Other (See Comments)    GI distress  . Allegra [Fexofenadine] Cough    Prior to Admission medications   Medication Sig Start Date End Date Taking? Authorizing Provider  albuterol (PROVENTIL HFA;VENTOLIN HFA) 108 (90 BASE) MCG/ACT inhaler Inhale 2 puffs into the lungs every 6 (six) hours as needed for wheezing or shortness of breath.   Yes Historical Provider, MD  aspirin 81 MG tablet Take 81 mg by mouth daily.   Yes Historical Provider, MD  Biotin 1000 MCG tablet Take 1,000 mcg by mouth daily.    Yes Historical Provider, MD  cetirizine (ZYRTEC) 10 MG tablet Take 10 mg by mouth daily.   Yes Historical Provider, MD  Cholecalciferol (D3-1000) 1000 UNITS capsule Take 5,000 Units by mouth daily.    Yes Historical Provider, MD  [facility] Liver Oil w/Vit A &  D CAPS Take 1 capsule by mouth daily.   Yes Historical Provider, MD  cyanocobalamin 500 MCG tablet Take 500 mcg by mouth daily.   Yes Historical Provider, MD  ENTRESTO 97-103 MG TAKE ONE TABLET BY MOUTH TWICE DAILY 04/04/15  Yes Alisa Graff, FNP  ferrous gluconate (FERGON) 324 MG tablet Take 324 mg by mouth daily with breakfast.   Yes Historical Provider, MD  Fluticasone-Salmeterol (ADVAIR) 250-50 MCG/DOSE AEPB Inhale 1 puff into the lungs 2 (two) times daily.   Yes Historical Provider, MD  furosemide (LASIX) 40 MG tablet Take 40 mg by mouth daily.   Yes Historical Provider, MD   glimepiride (AMARYL) 2 MG tablet Take 2 mg by mouth daily with breakfast.   Yes Historical Provider, MD  isosorbide-hydrALAZINE (BIDIL) 20-37.5 MG tablet Take 1 tablet by mouth 3 (three) times daily. 05/24/15  Yes Alisa Graff, FNP  magnesium oxide (MAG-OX) 400 MG tablet Take 800 mg by mouth 3 (three) times daily.    Yes Historical Provider, MD  meloxicam (MOBIC) 7.5 MG tablet Take 7.5 mg by mouth 2 (two) times daily as needed for pain.    Yes Historical Provider, MD  metFORMIN (GLUCOPHAGE) 500 MG tablet Take 1,000 mg by mouth 2 (two) times daily with a meal.   Yes Historical Provider, MD  metoprolol succinate (TOPROL-XL) 200 MG 24 hr tablet Take 1 tablet (200 mg total) by mouth daily. Take with or immediately following a meal. 05/23/15  Yes Alisa Graff, FNP  montelukast (SINGULAIR) 10 MG tablet Take 10 mg by mouth at bedtime.   Yes Historical Provider, MD  Multiple Vitamin (MULTIVITAMIN) tablet Take 1 tablet by mouth daily.    Yes Historical Provider, MD  omeprazole (PRILOSEC) 20 MG capsule Take 20 mg by mouth 2 (two) times daily before a meal.   Yes Historical Provider, MD  potassium chloride (K-DUR) 10 MEQ tablet Take 1 tablet (10 mEq total) by mouth daily. 07/07/15  Yes Alisa Graff, FNP  rosuvastatin (CRESTOR) 20 MG tablet Take 20 mg by mouth daily.   Yes Historical Provider, MD  sucralfate (CARAFATE) 1 G tablet Take 1 tablet (1 g total) by mouth 4 (four) times daily. Patient taking differently: Take 1 g by mouth 2 (two) times daily.  06/29/14  Yes Alisa Graff, FNP  traMADol-acetaminophen (ULTRACET) 37.5-325 MG per tablet Take 1 tablet by mouth every 6 (six) hours as needed for moderate pain.   Yes Historical Provider, MD  traZODone (DESYREL) 100 MG tablet Take 100 mg by mouth at bedtime.   Yes Historical Provider, MD  vitamin C (ASCORBIC ACID) 500 MG tablet Take 500 mg by mouth daily.   Yes Historical Provider, MD  vitamin E 400 UNIT capsule Take 400 Units by mouth daily.   Yes Historical  Provider, MD      Review of Systems  Constitutional: Positive for fatigue (getting better). Negative for appetite change.  HENT: Positive for congestion, ear pain, hearing loss (right ear) and rhinorrhea. Negative for sore throat.   Eyes: Negative.   Respiratory: Positive for cough. Negative for chest tightness and shortness of breath.   Cardiovascular: Negative for chest pain, palpitations and leg swelling.  Gastrointestinal: Negative for abdominal pain and abdominal distention.  Endocrine: Negative.   Genitourinary: Negative.   Musculoskeletal: Positive for back pain (lower back pain). Negative for neck pain.  Skin: Negative.   Allergic/Immunologic: Negative.   Neurological: Negative for dizziness, light-headedness and headaches.  Hematological: Negative for adenopathy. Does  not bruise/bleed easily.  Psychiatric/Behavioral: Negative for sleep disturbance (sleeping on 2 pillows) and dysphoric mood. The patient is not nervous/anxious.        Objective:   Physical Exam  Constitutional: She is oriented to person, place, and time. She appears well-developed and well-nourished.  HENT:  Head: Normocephalic and atraumatic.  Eyes: Conjunctivae are normal. Pupils are equal, round, and reactive to light.  Neck: Normal range of motion. Neck supple.  Cardiovascular: Normal rate and regular rhythm.   Pulmonary/Chest: Effort normal. She has no wheezes. She has no rales.  Abdominal: Soft. She exhibits no distension. There is no tenderness.  Musculoskeletal: She exhibits no edema or tenderness.  Neurological: She is alert and oriented to person, place, and time.  Skin: Skin is warm and dry.  Psychiatric: She has a normal mood and affect. Her behavior is normal. Thought content normal.  Nursing note and vitals reviewed.  BP 133/54 mmHg  Pulse 74  Resp 18  Ht 5\' 3"  (1.6 m)  Wt 175 lb (79.379 kg)  BMI 31.01 kg/m2  SpO2 100%  LMP 06/20/1967 (Within Weeks)        Assessment & Plan:   1: Chronic heart failure with reduced ejection fraction- Patient presents with fatigue that she feels like is improving. She denies any shortness of breath, swelling in her legs/abdomen or weight gain. She continues to weigh herself daily and says that she's lost a few pounds. By our scale, she's lost 6 pounds since she was last here on 05/24/15. Reminded to call for an overnight weight gain of >2 pounds or a weekly weight gain of >5 pounds. She is adding "a little" salt to her food but says that she does it on an infrequent basis. Discussed the importance of not adding any salt to her food. [facility] PharmD went in and reviewed medications with the patient. 2: HTN- Blood pressure looks good today. Continue medications at this time. 3: Diabetes- She says that her glucose this morning was 86. She follows closely with her PCP regarding this.  Medication bottles were reviewed.  Return here in 3 months or sooner for any questions/problems before the next office visit.

## 2015-07-07 NOTE — Patient Instructions (Signed)
Continue weighing daily and call for an overnight weight gain of > 2 pounds or a weekly weight gain of >5 pounds. 

## 2015-07-28 ENCOUNTER — Telehealth: Payer: Self-pay

## 2015-07-28 NOTE — Telephone Encounter (Signed)
Spoke with patient and advised her that any NSAID that she would take has the potential to possibly cause problems with her kidneys. She was taking meloxicam 7.5mg  twice daily and she stopped it after talking with the pharmacist and now her arthritis is flaring up and she's having to walk with a cane. Discussed that she could take it on as a as needed basis and that we would prefer that she not take it twice daily. If taking it daily as needed doesn't control her pain, she should speak with her PCP about alternative treatments. Patient verbalized understanding.

## 2015-07-28 NOTE — Telephone Encounter (Signed)
Ms. Barca called this morning. She states that when she was last seen in our office in May, she spoke with the Pharm D who instructed her that the medication she was taking for her arthritis pain is hard on her kidneys, and advised that it would be better to not take it. She states she hasn't used that medication since that May visit, but has since been in pain and has had to start using a cane again which is not her normal baseline. She is wondering if she could begin taking the medication again or try something else that would be better for to take. She does not know the name of the medication, when asked she simply states it was for her arthritis. Please advise.

## 2015-08-01 ENCOUNTER — Ambulatory Visit
Admission: RE | Admit: 2015-08-01 | Discharge: 2015-08-01 | Disposition: A | Discharge status: Home / Self Care | Payer: Commercial Managed Care - HMO | Source: Ambulatory Visit | Attending: Unknown Physician Specialty | Admitting: Unknown Physician Specialty

## 2015-08-01 ENCOUNTER — Other Ambulatory Visit: Payer: Self-pay | Admitting: Unknown Physician Specialty

## 2015-08-01 DIAGNOSIS — J0140 Acute pansinusitis, unspecified: Secondary | ICD-10-CM | POA: Insufficient documentation

## 2015-08-01 DIAGNOSIS — J329 Chronic sinusitis, unspecified: Secondary | ICD-10-CM

## 2015-08-03 ENCOUNTER — Other Ambulatory Visit: Payer: Self-pay | Admitting: Unknown Physician Specialty

## 2015-08-03 DIAGNOSIS — R930 Abnormal findings on diagnostic imaging of skull and head, not elsewhere classified: Secondary | ICD-10-CM

## 2015-08-26 ENCOUNTER — Ambulatory Visit
Admission: RE | Admit: 2015-08-26 | Discharge: 2015-08-26 | Disposition: A | Discharge status: Home / Self Care | Payer: Commercial Managed Care - HMO | Source: Ambulatory Visit | Attending: Unknown Physician Specialty | Admitting: Unknown Physician Specialty

## 2015-08-26 DIAGNOSIS — R930 Abnormal findings on diagnostic imaging of skull and head, not elsewhere classified: Secondary | ICD-10-CM

## 2015-08-26 DIAGNOSIS — I6523 Occlusion and stenosis of bilateral carotid arteries: Secondary | ICD-10-CM | POA: Diagnosis not present

## 2015-09-23 ENCOUNTER — Other Ambulatory Visit: Payer: Self-pay | Admitting: Family

## 2015-10-07 ENCOUNTER — Ambulatory Visit: Payer: Commercial Managed Care - HMO | Attending: Family | Admitting: Family

## 2015-10-07 ENCOUNTER — Encounter: Payer: Self-pay | Admitting: Family

## 2015-10-07 VITALS — BP 145/65 | HR 80 | Resp 18 | Ht 63.0 in | Wt 179.0 lb

## 2015-10-07 DIAGNOSIS — Z800 Family history of malignant neoplasm of digestive organs: Secondary | ICD-10-CM | POA: Insufficient documentation

## 2015-10-07 DIAGNOSIS — Z9889 Other specified postprocedural states: Secondary | ICD-10-CM | POA: Insufficient documentation

## 2015-10-07 DIAGNOSIS — J449 Chronic obstructive pulmonary disease, unspecified: Secondary | ICD-10-CM | POA: Insufficient documentation

## 2015-10-07 DIAGNOSIS — E118 Type 2 diabetes mellitus with unspecified complications: Secondary | ICD-10-CM | POA: Diagnosis not present

## 2015-10-07 DIAGNOSIS — I5022 Chronic systolic (congestive) heart failure: Secondary | ICD-10-CM

## 2015-10-07 DIAGNOSIS — Z91040 Latex allergy status: Secondary | ICD-10-CM | POA: Insufficient documentation

## 2015-10-07 DIAGNOSIS — Z791 Long term (current) use of non-steroidal anti-inflammatories (NSAID): Secondary | ICD-10-CM | POA: Diagnosis not present

## 2015-10-07 DIAGNOSIS — K219 Gastro-esophageal reflux disease without esophagitis: Secondary | ICD-10-CM | POA: Diagnosis not present

## 2015-10-07 DIAGNOSIS — Z87891 Personal history of nicotine dependence: Secondary | ICD-10-CM | POA: Insufficient documentation

## 2015-10-07 DIAGNOSIS — G4733 Obstructive sleep apnea (adult) (pediatric): Secondary | ICD-10-CM | POA: Insufficient documentation

## 2015-10-07 DIAGNOSIS — I110 Hypertensive heart disease with heart failure: Secondary | ICD-10-CM | POA: Insufficient documentation

## 2015-10-07 DIAGNOSIS — I509 Heart failure, unspecified: Secondary | ICD-10-CM | POA: Diagnosis not present

## 2015-10-07 DIAGNOSIS — M419 Scoliosis, unspecified: Secondary | ICD-10-CM | POA: Diagnosis not present

## 2015-10-07 DIAGNOSIS — E119 Type 2 diabetes mellitus without complications: Secondary | ICD-10-CM

## 2015-10-07 DIAGNOSIS — Z79899 Other long term (current) drug therapy: Secondary | ICD-10-CM | POA: Insufficient documentation

## 2015-10-07 DIAGNOSIS — Z7982 Long term (current) use of aspirin: Secondary | ICD-10-CM | POA: Insufficient documentation

## 2015-10-07 DIAGNOSIS — M1990 Unspecified osteoarthritis, unspecified site: Secondary | ICD-10-CM | POA: Insufficient documentation

## 2015-10-07 DIAGNOSIS — E785 Hyperlipidemia, unspecified: Secondary | ICD-10-CM | POA: Insufficient documentation

## 2015-10-07 DIAGNOSIS — Z880 Allergy status to penicillin: Secondary | ICD-10-CM | POA: Diagnosis not present

## 2015-10-07 DIAGNOSIS — Z888 Allergy status to other drugs, medicaments and biological substances status: Secondary | ICD-10-CM | POA: Insufficient documentation

## 2015-10-07 DIAGNOSIS — Z803 Family history of malignant neoplasm of breast: Secondary | ICD-10-CM | POA: Insufficient documentation

## 2015-10-07 DIAGNOSIS — Z8249 Family history of ischemic heart disease and other diseases of the circulatory system: Secondary | ICD-10-CM | POA: Diagnosis not present

## 2015-10-07 DIAGNOSIS — Z801 Family history of malignant neoplasm of trachea, bronchus and lung: Secondary | ICD-10-CM | POA: Insufficient documentation

## 2015-10-07 DIAGNOSIS — I10 Essential (primary) hypertension: Secondary | ICD-10-CM

## 2015-10-07 MED ORDER — POTASSIUM CHLORIDE ER 10 MEQ PO TBCR: 10.0000 meq | EXTENDED_RELEASE_TABLET | Freq: Every day | ORAL | 3 refills | Status: DC

## 2015-10-07 MED ORDER — SACUBITRIL-VALSARTAN 97-103 MG PO TABS
1.0000 | ORAL_TABLET | Freq: Two times a day (BID) | ORAL | 3 refills | Status: DC
Start: 2015-10-07 — End: 2016-09-19

## 2015-10-07 NOTE — Patient Instructions (Signed)
Resume weighing daily and call for an overnight weight gain of > 2 pounds or a weekly weight gain of >5 pounds. 

## 2015-10-07 NOTE — Progress Notes (Signed)
Subjective:    Patient ID: Diane Patton, female    DOB: 1945-05-14, 70 y.o.   MRN: [id number]  Congestive Heart Failure  Presents for initial visit. The disease course has been stable. Associated symptoms include abdominal pain and fatigue. Pertinent negatives include no chest pain, edema, orthopnea, palpitations or shortness of breath. The symptoms have been stable. Past treatments include angiotensin receptor blockers, beta blockers and salt and fluid restriction. The treatment provided moderate relief. Compliance with prior treatments has been good. Her past medical history is significant for chronic lung disease, DM and HTN. She has one 1st degree relative with heart disease.  Hypertension  This is a chronic problem. The current episode started more than 1 year ago. The problem is unchanged. The problem is controlled. Pertinent negatives include no chest pain, neck pain, palpitations, peripheral edema or shortness of breath. There are no associated agents to hypertension. Risk factors for coronary artery disease include diabetes mellitus, dyslipidemia, family history, post-menopausal state and sedentary lifestyle. Past treatments include angiotensin blockers, beta blockers, diuretics and lifestyle changes. The current treatment provides significant improvement. There are no compliance problems.  Hypertensive end-organ damage includes heart failure.   Past Medical History:  Diagnosis Date  . CHF (congestive heart failure) ([place])   . COPD (chronic obstructive pulmonary disease) ([place])   . Diabetes mellitus, type 2 ([place])   . GERD (gastroesophageal reflux disease)   . HTN (hypertension)   . Hyperlipidemia   . Obstructive sleep apnea on CPAP   . Osteoarthritis   . Scoliosis     Past Surgical History:  Procedure Laterality Date  . ankle (other)    . APPENDECTOMY    . BREAST SURGERY    . ear (otheR)    . feet (other)    . sinus (other)    . stomach      Family History  Problem Relation  Age of Onset  . Alzheimer's disease Other   . Heart attack Mother   . Hypertension Mother   . Cancer Brother     lung  . Cancer Maternal Aunt     mouth and breast  . Cancer Daughter     throat    Social History  Substance Use Topics  . Smoking status: Former Smoker    Packs/day: 1.00    Years: 15.00    Types: Cigarettes    Quit date: 07/21/1982  . Smokeless tobacco: Never Used  . Alcohol use 9.0 oz/week    15 Standard drinks or equivalent per week     Comment: quit drinking 04/1981    Allergies  Allergen Reactions  . Amoxicillin Other (See Comments)    GI distress  . Aspirin Diarrhea and Nausea And Vomiting  . Celebrex [Celecoxib] Nausea Only  . Latex Itching and Swelling  . Penicillins Other (See Comments)    GI distress  . Allegra [Fexofenadine] Cough    Prior to Admission medications   Medication Sig Start Date End Date Taking? Authorizing Provider  albuterol (PROVENTIL HFA;VENTOLIN HFA) 108 (90 BASE) MCG/ACT inhaler Inhale 2 puffs into the lungs every 6 (six) hours as needed for wheezing or shortness of breath.   Yes Historical Provider, MD  aspirin 81 MG tablet Take 81 mg by mouth daily.   Yes Historical Provider, MD  BIDIL 20-37.5 MG tablet TAKE ONE TABLET BY MOUTH THREE TIMES DAILY 09/23/15  Yes Alisa Graff, FNP  Biotin 1000 MCG tablet Take 1,000 mcg by mouth daily.    Yes Historical  Provider, MD  cetirizine (ZYRTEC) 10 MG tablet Take 10 mg by mouth daily.   Yes Historical Provider, MD  Cholecalciferol (D3-1000) 1000 UNITS capsule Take 1,000 Units by mouth daily.    Yes Historical Provider, MD  [facility] Liver Oil w/Vit A & D CAPS Take 1 capsule by mouth daily.   Yes Historical Provider, MD  cyanocobalamin 500 MCG tablet Take 500 mcg by mouth daily.   Yes Historical Provider, MD  ferrous gluconate (FERGON) 324 MG tablet Take 324 mg by mouth daily with breakfast.   Yes Historical Provider, MD  Fluticasone-Salmeterol (ADVAIR) 250-50 MCG/DOSE AEPB Inhale 1 puff into the  lungs 2 (two) times daily.   Yes Historical Provider, MD  furosemide (LASIX) 40 MG tablet Take 40 mg by mouth daily.   Yes Historical Provider, MD  glimepiride (AMARYL) 2 MG tablet Take 2 mg by mouth daily with breakfast.   Yes Historical Provider, MD  magnesium oxide (MAG-OX) 400 MG tablet Take 800 mg by mouth 3 (three) times daily.    Yes Historical Provider, MD  meloxicam (MOBIC) 7.5 MG tablet Take 7.5 mg by mouth 2 (two) times daily as needed for pain.    Yes Historical Provider, MD  metFORMIN (GLUCOPHAGE) 500 MG tablet Take 1,000 mg by mouth 2 (two) times daily with a meal.   Yes Historical Provider, MD  metoprolol succinate (TOPROL-XL) 200 MG 24 hr tablet Take 1 tablet (200 mg total) by mouth daily. Take with or immediately following a meal. 05/23/15  Yes Alisa Graff, FNP  montelukast (SINGULAIR) 10 MG tablet Take 10 mg by mouth at bedtime.   Yes Historical Provider, MD  Multiple Vitamin (MULTIVITAMIN) tablet Take 1 tablet by mouth daily.    Yes Historical Provider, MD  omeprazole (PRILOSEC) 20 MG capsule Take 20 mg by mouth 2 (two) times daily before a meal.   Yes Historical Provider, MD  potassium chloride (K-DUR) 10 MEQ tablet Take 1 tablet (10 mEq total) by mouth daily. 10/07/15  Yes Alisa Graff, FNP  rosuvastatin (CRESTOR) 20 MG tablet Take 20 mg by mouth daily.   Yes Historical Provider, MD  sacubitril-valsartan (ENTRESTO) 97-103 MG Take 1 tablet by mouth 2 (two) times daily. 10/07/15  Yes Alisa Graff, FNP  traMADol-acetaminophen (ULTRACET) 37.5-325 MG per tablet Take 1 tablet by mouth every 6 (six) hours as needed for moderate pain.   Yes Historical Provider, MD  traZODone (DESYREL) 100 MG tablet Take 100 mg by mouth at bedtime.   Yes Historical Provider, MD  vitamin C (ASCORBIC ACID) 500 MG tablet Take 500 mg by mouth daily.   Yes Historical Provider, MD  vitamin E 400 UNIT capsule Take 400 Units by mouth daily.   Yes Historical Provider, MD      Review of Systems   Constitutional: Positive for fatigue. Negative for appetite change.  HENT: Negative for congestion, postnasal drip and sore throat.   Eyes: Negative.   Respiratory: Positive for cough (productive). Negative for chest tightness and shortness of breath.   Cardiovascular: Negative for chest pain, palpitations and leg swelling.  Gastrointestinal: Positive for abdominal pain and diarrhea. Negative for abdominal distention.  Endocrine: Negative.   Genitourinary: Negative.   Musculoskeletal: Negative for back pain and neck pain.  Allergic/Immunologic: Negative.   Neurological: Negative for dizziness and light-headedness.  Hematological: Negative for adenopathy. Does not bruise/bleed easily.  Psychiatric/Behavioral: Negative for dysphoric mood and sleep disturbance (sleeping on 2 pillows). The patient is not nervous/anxious.  Objective:   Physical Exam  Constitutional: She is oriented to person, place, and time. She appears well-developed and well-nourished.  HENT:  Head: Normocephalic and atraumatic.  Eyes: Conjunctivae are normal. Pupils are equal, round, and reactive to light.  Neck: Normal range of motion. Neck supple.  Cardiovascular: Normal rate and regular rhythm.   Pulmonary/Chest: Effort normal. She has no wheezes. She has no rales.  Abdominal: Soft. She exhibits no distension. There is no tenderness.  Musculoskeletal: She exhibits no edema or tenderness.  Neurological: She is alert and oriented to person, place, and time.  Skin: Skin is warm and dry.  Psychiatric: She has a normal mood and affect. Her behavior is normal. Thought content normal.  Nursing note and vitals reviewed.   BP (!) 145/65   Pulse 80   Resp 18   Ht 5\' 3"  (1.6 m)   Wt 179 lb (81.2 kg)   LMP 06/20/1967 (Within Weeks)   SpO2 100%   BMI 31.71 kg/m        Assessment & Plan:  1:Chronic heart failure with reduced ejection fraction- Patient presents with fatigue upon moderate exertion (Class II)  which is quickly improved upon rest. She denies any shortness of breath or swelling in her legs. She hasn't been weighing daily because she says that her scales aren't working. She says that she tried new batteries but it still doesn't work so a set of scales was given to her today. Reminded her to call for an overnight weight gain of >2 pounds or a weekly weight gain of >5 pounds. By our scale, she's gained 4.4 pounds since she was last here on 07/07/15. She is not adding any salt to her food and is trying to eat low sodium foods. [facility] PharmD went in and reviewed medications with the patient. She says that she's due to see her cardiologist in October.  2: HTN- Blood pressure looks good. Continue medications at this time. 3: Diabetes- She says that her glucose yesterday was 90. Follows closely with her PCP regarding this.  She mentions that the iron that she's now taking is giving her diarrhea. She says that when she took this in the past, it also gave her diarrhea. She is having a colonoscopy on 10/13/15.  Return here in 3 months or sooner for any questions/problems before then.

## 2015-10-08 IMAGING — CT CT OF THE LEFT KNEE WITHOUT CONTRAST
3 of 7 series · 12 of 33 positions shown, 14 images · non-contrast
Comparison: None.

CLINICAL DATA: Patient for left knee replacement. Planning
procedure per the GAY protocol.

EXAM:
CT OF THE LEFT KNEE, HIP AND ANKLE WITHOUT CONTRAST
TECHNIQUE: Multidetector CT imaging of the left knee, hip and ankle was
performed according to the GAY protocol. Multiplanar CT image
reconstructions were also generated.

[Series 4: knee · axial · 0.34mm/px · z∈[+417,+584]mm · 4 of 279 slices shown, 5 images]
[im 56/279  soft-tissue]
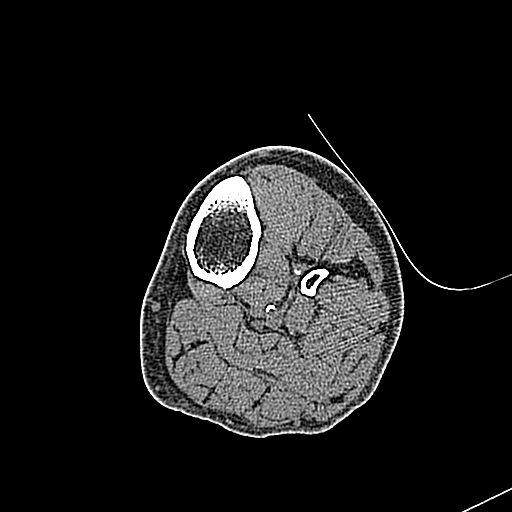
[im 56/279  bone]
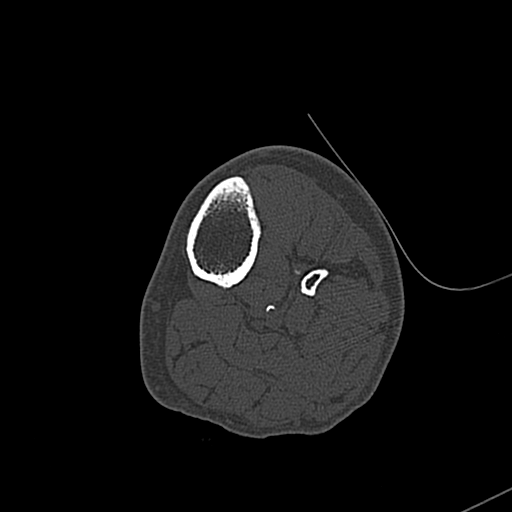
[im 112/279  bone]
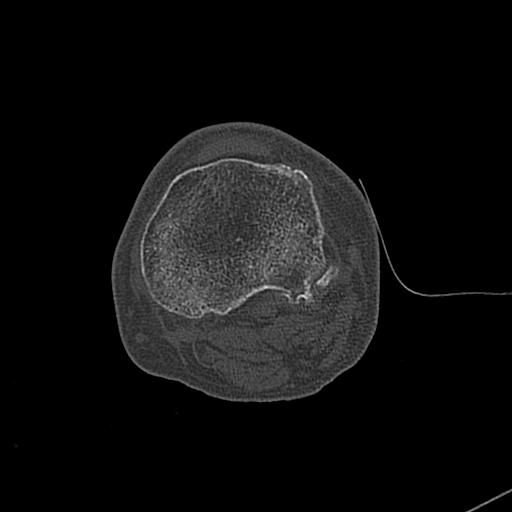
[im 167/279  bone]
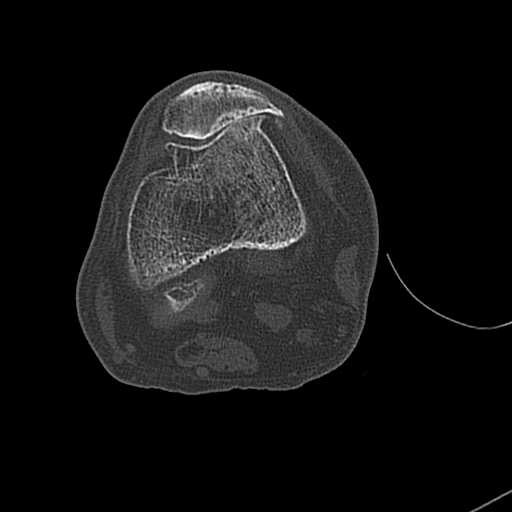
[im 223/279  bone]
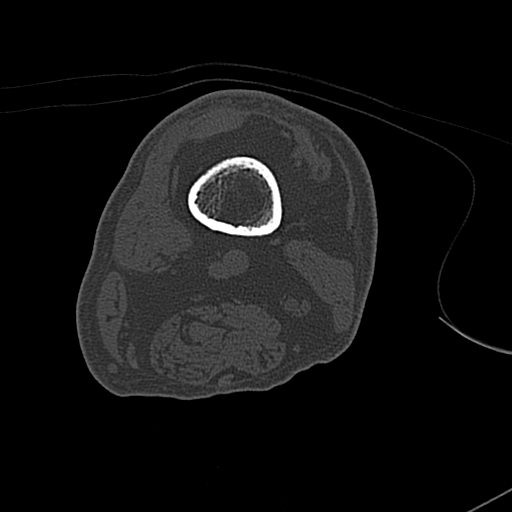

[Series 605: sagittal soft tissue · sagittal · 0.54mm/px · 5 of 48 slices shown, 6 images]
[im 16/48  bone]
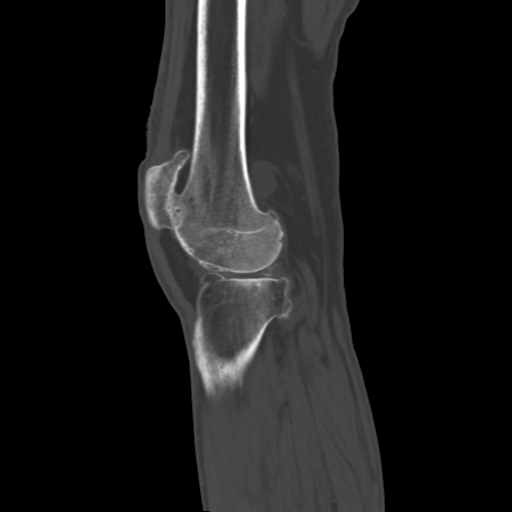
[im 20/48  bone]
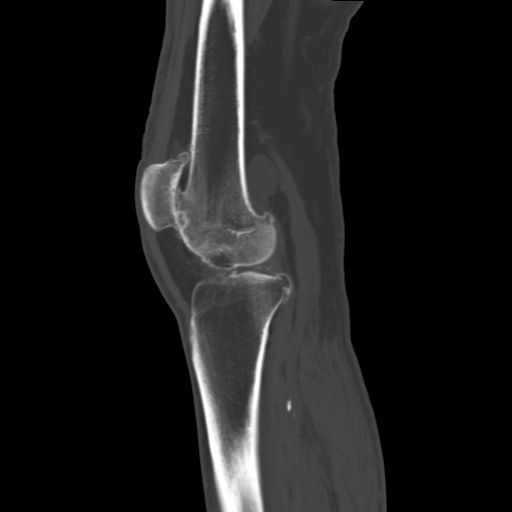
[im 24/48  soft-tissue]
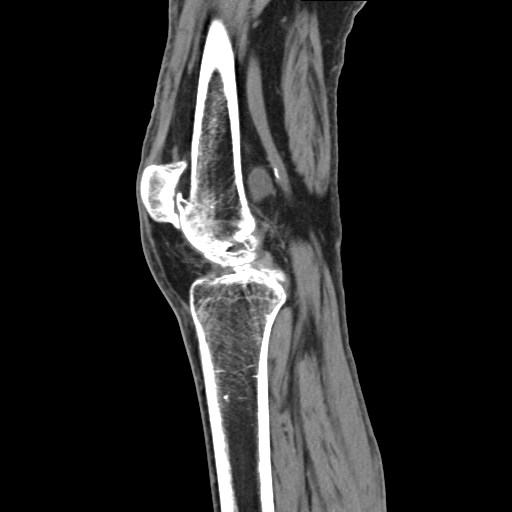
[im 24/48  bone]
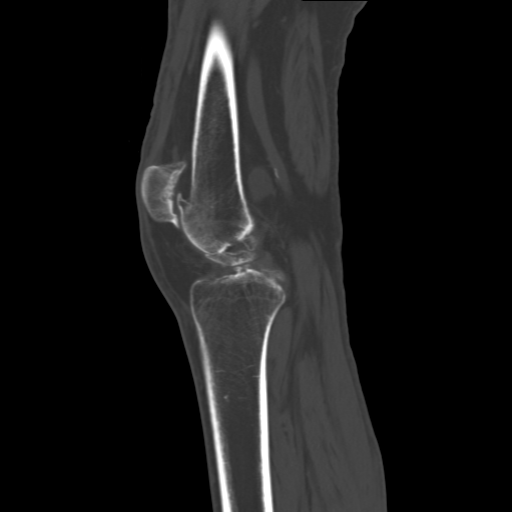
[im 28/48  bone]
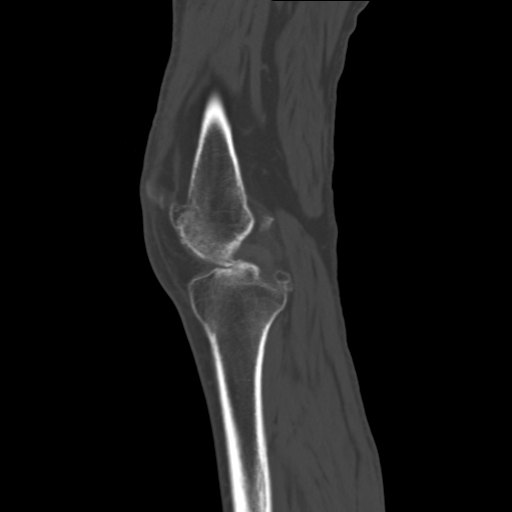
[im 32/48  bone]
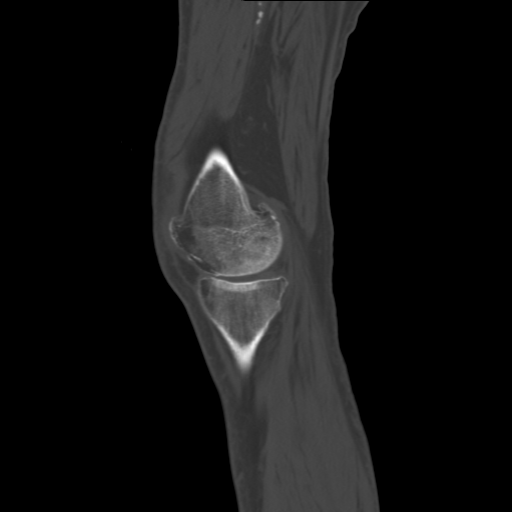

[Series 606: coronal soft tissue · coronal · 0.54mm/px · 3 of 49 slices shown]
[im 10/49  bone]
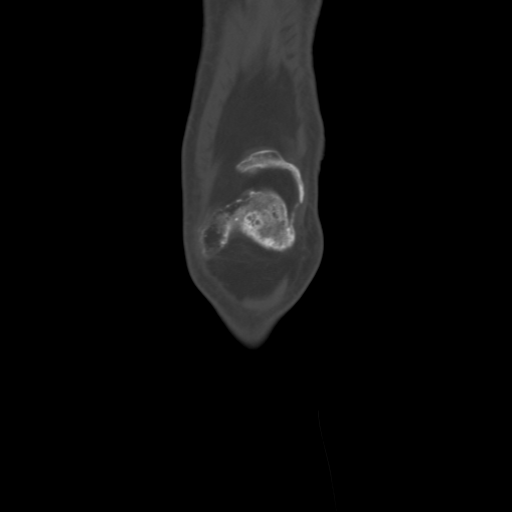
[im 20/49  bone]
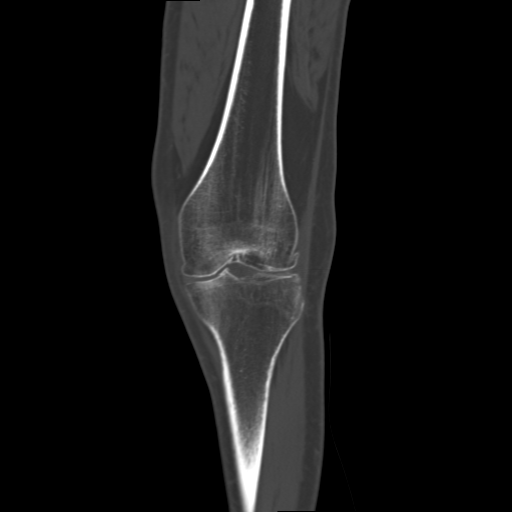
[im 29/49  bone]
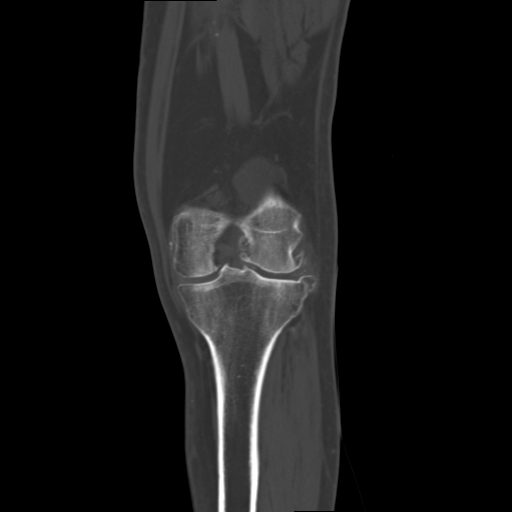

[12 of 33 positions shown; findings below may reference images not displayed]

FINDINGS: Axial imaging of the left hip and ankle demonstrates no focal
abnormality. Minimal degenerative change about the left hip is
noted. Imaging of the left knee demonstrates severe tricompartmental
osteoarthritis. Chondrocalcinosis is present. No fracture or focal
bony lesion. Soft tissues are unremarkable.
IMPRESSION: No acute finding.  Severe tricompartmental osteoarthritis left knee.

## 2016-01-09 ENCOUNTER — Ambulatory Visit: Payer: Commercial Managed Care - HMO | Admitting: Family

## 2016-01-18 ENCOUNTER — Encounter: Payer: Self-pay | Admitting: Family

## 2016-01-18 ENCOUNTER — Ambulatory Visit: Payer: Medicare Other | Attending: Family | Admitting: Family

## 2016-01-18 VITALS — BP 152/67 | HR 73 | Resp 18 | Ht 63.0 in | Wt 180.0 lb

## 2016-01-18 DIAGNOSIS — M25531 Pain in right wrist: Secondary | ICD-10-CM | POA: Diagnosis not present

## 2016-01-18 DIAGNOSIS — E119 Type 2 diabetes mellitus without complications: Secondary | ICD-10-CM | POA: Diagnosis not present

## 2016-01-18 DIAGNOSIS — Z7982 Long term (current) use of aspirin: Secondary | ICD-10-CM | POA: Diagnosis not present

## 2016-01-18 DIAGNOSIS — Z5189 Encounter for other specified aftercare: Secondary | ICD-10-CM | POA: Diagnosis not present

## 2016-01-18 DIAGNOSIS — Z79899 Other long term (current) drug therapy: Secondary | ICD-10-CM | POA: Diagnosis not present

## 2016-01-18 DIAGNOSIS — I5022 Chronic systolic (congestive) heart failure: Secondary | ICD-10-CM

## 2016-01-18 DIAGNOSIS — I110 Hypertensive heart disease with heart failure: Secondary | ICD-10-CM | POA: Diagnosis not present

## 2016-01-18 DIAGNOSIS — Z87891 Personal history of nicotine dependence: Secondary | ICD-10-CM | POA: Diagnosis not present

## 2016-01-18 DIAGNOSIS — Z7984 Long term (current) use of oral hypoglycemic drugs: Secondary | ICD-10-CM | POA: Insufficient documentation

## 2016-01-18 DIAGNOSIS — Z880 Allergy status to penicillin: Secondary | ICD-10-CM | POA: Diagnosis not present

## 2016-01-18 DIAGNOSIS — Z9889 Other specified postprocedural states: Secondary | ICD-10-CM | POA: Diagnosis not present

## 2016-01-18 DIAGNOSIS — I10 Essential (primary) hypertension: Secondary | ICD-10-CM

## 2016-01-18 NOTE — Progress Notes (Signed)
Patient ID: Diane Patton, female    DOB: [birth year], 70 y.o.   MRN: [id number]  HPI  Diane Patton is a 70 y/o female with a history of osteoarthritis, obstructive sleep apnea, hyperlipidemia, HTN, GERD, DM, COPD, pulmonary HTN, remote tobacco use and chronic heart failure.  Last echo was done on 08/17/15 and showed an EF of 40% with mild MR, AR and TR. Mild/mod pulmonary HTN. This is a slight improvement from 12/06/13 when her EF was 35-40%. Had cardiac catheterization done 05/12/14.  Has not been admitted in the last few years for any reason.  She presents today for a follow-up visit with fatigue upon moderate exertion. Denies any shortness of breath or swelling in her legs. Continues to weigh daily and reports a stable weight. Has been having some headaches and has had a MRI done. Needs to get her eyes checked. Biggest concern is of some pain in her right wrist that she attributes to arthritis.   Past Medical History:  Diagnosis Date  . CHF (congestive heart failure) ([place])   . COPD (chronic obstructive pulmonary disease) ([place])   . Diabetes mellitus, type 2 ([place])   . GERD (gastroesophageal reflux disease)   . HTN (hypertension)   . Hyperlipidemia   . Obstructive sleep apnea on CPAP   . Osteoarthritis   . Scoliosis     Past Surgical History:  Procedure Laterality Date  . ankle (other)    . APPENDECTOMY    . BREAST SURGERY    . ear (otheR)    . feet (other)    . sinus (other)    . stomach      Family History  Problem Relation Age of Onset  . Alzheimer's disease Other   . Heart attack Mother   . Hypertension Mother   . Cancer Brother     lung  . Cancer Maternal Aunt     mouth and breast  . Cancer Daughter     throat    Social History  Substance Use Topics  . Smoking status: Former Smoker    Packs/day: 1.00    Years: 15.00    Types: Cigarettes    Quit date: 07/21/1982  . Smokeless tobacco: Never Used  . Alcohol use 9.0 oz/week    15 Standard drinks or equivalent per week      Comment: quit drinking 04/1981    Allergies  Allergen Reactions  . Amoxicillin Other (See Comments)    GI distress  . Aspirin Diarrhea and Nausea And Vomiting  . Celebrex [Celecoxib] Nausea Only  . Latex Itching and Swelling  . Penicillins Other (See Comments)    GI distress  . Allegra [Fexofenadine] Cough    Prior to Admission medications   Medication Sig Start Date End Date Taking? Authorizing Provider  albuterol (PROVENTIL HFA;VENTOLIN HFA) 108 (90 BASE) MCG/ACT inhaler Inhale 2 puffs into the lungs every 6 (six) hours as needed for wheezing or shortness of breath.   Yes Historical Provider, MD  aspirin 81 MG tablet Take 81 mg by mouth daily.   Yes Historical Provider, MD  BIDIL 20-37.5 MG tablet TAKE ONE TABLET BY MOUTH THREE TIMES DAILY 09/23/15  Yes Alisa Graff, FNP  Biotin 1000 MCG tablet Take 1,000 mcg by mouth daily.    Yes Historical Provider, MD  cetirizine (ZYRTEC) 10 MG tablet Take 10 mg by mouth daily.   Yes Historical Provider, MD  Cholecalciferol (D3-1000) 1000 UNITS capsule Take 1,000 Units by mouth daily.    Yes  Historical Provider, MD  ciprofloxacin (CIPRO) 500 MG tablet Take 500 mg by mouth 2 (two) times daily.   Yes Historical Provider, MD  [facility] Liver Oil w/Vit A & D CAPS Take 1 capsule by mouth daily.   Yes Historical Provider, MD  cyanocobalamin 500 MCG tablet Take 500 mcg by mouth daily.   Yes Historical Provider, MD  ferrous fumarate-iron polysaccharide complex (TANDEM) 162-115.2 MG CAPS capsule Take 1 capsule by mouth daily with breakfast.   Yes Historical Provider, MD  Fluticasone-Salmeterol (ADVAIR) 250-50 MCG/DOSE AEPB Inhale 1 puff into the lungs 2 (two) times daily.   Yes Historical Provider, MD  furosemide (LASIX) 40 MG tablet Take 40 mg by mouth daily.   Yes Historical Provider, MD  glimepiride (AMARYL) 2 MG tablet Take 2 mg by mouth daily with breakfast.   Yes Historical Provider, MD  magnesium oxide (MAG-OX) 400 MG tablet Take 800 mg by mouth 3  (three) times daily.    Yes Historical Provider, MD  meloxicam (MOBIC) 7.5 MG tablet Take 7.5 mg by mouth 2 (two) times daily as needed for pain.    Yes Historical Provider, MD  metFORMIN (GLUCOPHAGE) 500 MG tablet Take 1,000 mg by mouth 2 (two) times daily with a meal.   Yes Historical Provider, MD  metoprolol succinate (TOPROL-XL) 200 MG 24 hr tablet Take 1 tablet (200 mg total) by mouth daily. Take with or immediately following a meal. 05/23/15  Yes Alisa Graff, FNP  montelukast (SINGULAIR) 10 MG tablet Take 10 mg by mouth at bedtime.   Yes Historical Provider, MD  Multiple Vitamin (MULTIVITAMIN) tablet Take 1 tablet by mouth daily.    Yes Historical Provider, MD  omeprazole (PRILOSEC) 20 MG capsule Take 20 mg by mouth 2 (two) times daily before a meal.   Yes Historical Provider, MD  potassium chloride (K-DUR) 10 MEQ tablet Take 1 tablet (10 mEq total) by mouth daily. 10/07/15  Yes Alisa Graff, FNP  rosuvastatin (CRESTOR) 20 MG tablet Take 20 mg by mouth daily.   Yes Historical Provider, MD  sacubitril-valsartan (ENTRESTO) 97-103 MG Take 1 tablet by mouth 2 (two) times daily. 10/07/15  Yes Alisa Graff, FNP  traZODone (DESYREL) 100 MG tablet Take 100 mg by mouth at bedtime.   Yes Historical Provider, MD  vitamin C (ASCORBIC ACID) 500 MG tablet Take 1,000 mg by mouth daily.    Yes Historical Provider, MD  vitamin E 400 UNIT capsule Take 400 Units by mouth daily.   Yes Historical Provider, MD     Review of Systems  Constitutional: Positive for fatigue. Negative for appetite change.  HENT: Positive for congestion, hearing loss and rhinorrhea. Negative for sore throat.   Eyes: Negative.        Needs to get eyes checked   Respiratory: Positive for cough. Negative for chest tightness and shortness of breath.   Cardiovascular: Negative for chest pain, palpitations and leg swelling.  Gastrointestinal: Negative for abdominal distention and abdominal pain.  Endocrine: Negative.    Genitourinary: Negative.   Musculoskeletal: Positive for arthralgias (right wrist). Negative for back pain.  Skin: Negative.   Allergic/Immunologic: Negative.   Neurological: Positive for light-headedness and headaches (had recent MRI). Negative for dizziness.  Hematological: Negative for adenopathy. Does not bruise/bleed easily.  Psychiatric/Behavioral: Positive for sleep disturbance (didn't sleep well last 2 nights due to wrist pain; sleeping on 2 pillows). Negative for dysphoric mood. The patient is not nervous/anxious.    Vitals:   01/18/16 [id number]  BP: (!) 152/67  Pulse: 73  Resp: 18  SpO2: 99%  Weight: 180 lb (81.6 kg)  Height: 5\' 3"  (1.6 m)   Wt Readings from Last 3 Encounters:  01/18/16 180 lb (81.6 kg)  10/07/15 179 lb (81.2 kg)  07/07/15 175 lb (79.4 kg)   Lab Results  Component Value Date   CREATININE 0.82 11/01/2014   CREATININE 1.22 12/08/2013   CREATININE 1.26 12/06/2013   Physical Exam  Constitutional: She is oriented to person, place, and time. She appears well-developed and well-nourished.  HENT:  Head: Normocephalic and atraumatic.  Eyes: Conjunctivae are normal. Pupils are equal, round, and reactive to light.  Neck: Normal range of motion. Neck supple. No JVD present.  Cardiovascular: Normal rate and regular rhythm.   Pulmonary/Chest: Effort normal. She has no wheezes. She has no rales.  Abdominal: Soft. She exhibits no distension. There is no tenderness.  Musculoskeletal: She exhibits no edema or tenderness.  Neurological: She is alert and oriented to person, place, and time.  Skin: Skin is warm and dry.  Psychiatric: She has a normal mood and affect. Her behavior is normal. Thought content normal.  Nursing note and vitals reviewed.  Assessment & Plan:  1: Chronic heart failure with reduced ejection fraction- - NYHA class II - euvolemic - weight stable. Reminded to call for an overnight weight gain of >2 pounds or a weekly weight gain of >5 pounds -  on max entrestro and toprol. On bidil as well - recently saw her cardiologist ([place]) on 01/09/16 and returns to him in 6 months - PharmD went in and reviewed medications with the patient - did receive her flu vaccine for the season already  2: HTN- - BP mildly elevated today but her wrist is hurting today - recently saw her PCP Candiss Norse) on 12/26/15 and returns to her in 3 months  3: Diabetes- - reports glucose of 101 this morning right after she ate - follows with PCP about her diabetes  4: Right wrist pain- - no injury that she's aware of - wearing a soft brace right now - encouraged her to follow-up with her PCP should this not resolve  Return here in 3 months or sooner for any questions/problems before then

## 2016-01-18 NOTE — Patient Instructions (Signed)
Continue weighing daily and call for an overnight weight gain of > 2 pounds or a weekly weight gain of >5 pounds. 

## 2016-01-19 DIAGNOSIS — G4730 Sleep apnea, unspecified: Secondary | ICD-10-CM | POA: Insufficient documentation

## 2016-01-20 ENCOUNTER — Other Ambulatory Visit: Payer: Self-pay | Admitting: Family

## 2016-02-04 ENCOUNTER — Other Ambulatory Visit: Payer: Self-pay

## 2016-02-04 ENCOUNTER — Emergency Department
Admission: EM | Admit: 2016-02-04 | Discharge: 2016-02-05 | Disposition: A | Discharge status: Home / Self Care | Payer: Medicare Other | Attending: Student in an Organized Health Care Education/Training Program | Admitting: Student in an Organized Health Care Education/Training Program

## 2016-02-04 ENCOUNTER — Encounter: Payer: Self-pay | Admitting: Emergency Medicine

## 2016-02-04 ENCOUNTER — Emergency Department: Payer: Medicare Other

## 2016-02-04 DIAGNOSIS — Z7982 Long term (current) use of aspirin: Secondary | ICD-10-CM | POA: Diagnosis not present

## 2016-02-04 DIAGNOSIS — Z7984 Long term (current) use of oral hypoglycemic drugs: Secondary | ICD-10-CM | POA: Diagnosis not present

## 2016-02-04 DIAGNOSIS — E119 Type 2 diabetes mellitus without complications: Secondary | ICD-10-CM | POA: Diagnosis not present

## 2016-02-04 DIAGNOSIS — Z91040 Latex allergy status: Secondary | ICD-10-CM | POA: Diagnosis not present

## 2016-02-04 DIAGNOSIS — I110 Hypertensive heart disease with heart failure: Secondary | ICD-10-CM | POA: Diagnosis not present

## 2016-02-04 DIAGNOSIS — I5022 Chronic systolic (congestive) heart failure: Secondary | ICD-10-CM | POA: Insufficient documentation

## 2016-02-04 DIAGNOSIS — Z87891 Personal history of nicotine dependence: Secondary | ICD-10-CM | POA: Diagnosis not present

## 2016-02-04 DIAGNOSIS — R05 Cough: Secondary | ICD-10-CM

## 2016-02-04 DIAGNOSIS — J441 Chronic obstructive pulmonary disease with (acute) exacerbation: Secondary | ICD-10-CM

## 2016-02-04 DIAGNOSIS — R059 Cough, unspecified: Secondary | ICD-10-CM

## 2016-02-04 LAB — CBC WITH DIFFERENTIAL/PLATELET
BASOS ABS: 0 10*3/uL (ref 0–0.1)
BASOS PCT: 0 %
Eosinophils Absolute: 0.1 10*3/uL (ref 0–0.7)
Eosinophils Relative: 1 %
HEMATOCRIT: 34 % — AB (ref 35.0–47.0)
HEMOGLOBIN: 11.1 g/dL — AB (ref 12.0–16.0)
LYMPHS PCT: 18 %
Lymphs Abs: 1.7 10*3/uL (ref 1.0–3.6)
MCH: 29.5 pg (ref 26.0–34.0)
MCHC: 32.6 g/dL (ref 32.0–36.0)
MCV: 90.4 fL (ref 80.0–100.0)
Monocytes Absolute: 0.5 10*3/uL (ref 0.2–0.9)
Monocytes Relative: 6 %
NEUTROS ABS: 6.8 10*3/uL — AB (ref 1.4–6.5)
NEUTROS PCT: 75 %
Platelets: 261 10*3/uL (ref 150–440)
RBC: 3.76 MIL/uL — ABNORMAL LOW (ref 3.80–5.20)
RDW: 14.9 % — ABNORMAL HIGH (ref 11.5–14.5)
WBC: 9.1 10*3/uL (ref 3.6–11.0)

## 2016-02-04 LAB — TROPONIN I: Troponin I: <0.03 ng/mL (ref <0.03)

## 2016-02-04 LAB — COMPREHENSIVE METABOLIC PANEL
ALBUMIN: 4.3 g/dL (ref 3.5–5.0)
ALK PHOS: 40 U/L (ref 38–126)
ALT: 15 U/L (ref 14–54)
AST: 21 U/L (ref 15–41)
Anion gap: 8 (ref 5–15)
BILIRUBIN TOTAL: 0.5 mg/dL (ref 0.3–1.2)
BUN: 10 mg/dL (ref 6–20)
CALCIUM: 9.8 mg/dL (ref 8.9–10.3)
CO2: 24 mmol/L (ref 22–32)
CREATININE: 0.77 mg/dL (ref 0.44–1.00)
Chloride: 108 mmol/L (ref 101–111)
GFR calc Af Amer: >60 mL/min (ref >60)
GFR calc non Af Amer: >60 mL/min (ref >60)
GLUCOSE: 134 mg/dL — AB (ref 65–99)
Potassium: 3.7 mmol/L (ref 3.5–5.1)
Sodium: 140 mmol/L (ref 135–145)
TOTAL PROTEIN: 6.8 g/dL (ref 6.5–8.1)

## 2016-02-04 LAB — MAGNESIUM: Magnesium: 1.6 mg/dL — ABNORMAL LOW (ref 1.7–2.4)

## 2016-02-04 MED ORDER — AZITHROMYCIN 500 MG PO TABS
500.0000 mg | ORAL_TABLET | Freq: Once | ORAL | Status: AC
  Administered 2016-02-04: 500 mg via ORAL
  Filled 2016-02-04: qty 1

## 2016-02-04 MED ORDER — METHYLPREDNISOLONE SODIUM SUCC 125 MG IJ SOLR
60.0000 mg | Freq: Once | INTRAMUSCULAR | Status: AC
  Administered 2016-02-04: 60 mg via INTRAVENOUS
  Filled 2016-02-04: qty 2

## 2016-02-04 MED ORDER — MAGNESIUM SULFATE 2 GM/50ML IV SOLN
2.0000 g | Freq: Once | INTRAVENOUS | Status: AC
  Administered 2016-02-04: 2 g via INTRAVENOUS
  Filled 2016-02-04: qty 50

## 2016-02-04 MED ORDER — AZITHROMYCIN 250 MG PO TABS: ORAL_TABLET | ORAL | 0 refills | Status: AC

## 2016-02-04 MED ORDER — PREDNISONE 20 MG PO TABS: 40.0000 mg | ORAL_TABLET | Freq: Every day | ORAL | 0 refills | Status: AC

## 2016-02-04 NOTE — ED Triage Notes (Signed)
Per pt has had cough "a few weeks" and intermittent wheezing. Hx of copd. Has nebulizer and states if does help when she wheezes. vss.

## 2016-02-04 NOTE — ED Provider Notes (Signed)
[facility] Emergency Department Provider Note    First MD Initiated Contact with Patient 02/04/16 2105     (approximate)  I have reviewed the triage vital signs and the nursing notes.   HISTORY  Chief Complaint Cough    HPI Diane Patton is a 70 y.o. female who is very pleasant with a history of CHF and COPD as well as diabetes presents with a few weeks reported of cough and intermittent wheezing. Patient wears 2 L nasal cannula at baseline. Had a coughing fit today with worsening shortness of breath so she called EMS for further evaluation. States she has been having chills but no measured fevers. States she is coughing up clear phlegm. Denies any chest pain at this time. Denies any nausea or vomiting. Denies any lower extremity swelling or worsening orthopnea.  No recent antibiotic use.Marland Kitchen Past Medical History:  Diagnosis Date  . CHF (congestive heart failure) ([place])   . COPD (chronic obstructive pulmonary disease) ([place])   . Diabetes mellitus, type 2 ([place])   . GERD (gastroesophageal reflux disease)   . HTN (hypertension)   . Hyperlipidemia   . Obstructive sleep apnea on CPAP   . Osteoarthritis   . Scoliosis    Family History  Problem Relation Age of Onset  . Alzheimer's disease Other   . Heart attack Mother   . Hypertension Mother   . Cancer Brother     lung  . Cancer Maternal Aunt     mouth and breast  . Cancer Daughter     throat   Past Surgical History:  Procedure Laterality Date  . ankle (other)    . APPENDECTOMY    . BREAST SURGERY    . ear (otheR)    . feet (other)    . sinus (other)    . stomach     Patient Active Problem List   Diagnosis Date Noted  . Sleep apnea 01/19/2016  . Insomnia 03/21/2015  . Cough 11/01/2014  . Diabetes ([place]) 09/29/2014  . Chronic obstructive pulmonary disease ([place]) 09/29/2014  . Chronic systolic heart failure ([place]) 06/09/2014  . HYPERLIPIDEMIA-MIXED 12/16/2008  . Essential hypertension 12/16/2008       Prior to Admission medications   Medication Sig Start Date End Date Taking? Authorizing Provider  albuterol (PROVENTIL HFA;VENTOLIN HFA) 108 (90 BASE) MCG/ACT inhaler Inhale 2 puffs into the lungs every 6 (six) hours as needed for wheezing or shortness of breath.    Historical Provider, MD  aspirin 81 MG tablet Take 81 mg by mouth daily.    Historical Provider, MD  azithromycin (ZITHROMAX Z-PAK) 250 MG tablet Take 1 tablet (250 mg) once daily on Days 2 through 5. 02/04/16 02/09/16  Merlyn Lot, MD  BIDIL 20-37.5 MG tablet TAKE ONE TABLET BY MOUTH THREE TIMES DAILY 01/20/16   Alisa Graff, FNP  Biotin 1000 MCG tablet Take 1,000 mcg by mouth daily.     Historical Provider, MD  cetirizine (ZYRTEC) 10 MG tablet Take 10 mg by mouth daily.    Historical Provider, MD  Cholecalciferol (D3-1000) 1000 UNITS capsule Take 1,000 Units by mouth daily.     Historical Provider, MD  ciprofloxacin (CIPRO) 500 MG tablet Take 500 mg by mouth 2 (two) times daily.    Historical Provider, MD  [facility] Liver Oil w/Vit A & D CAPS Take 1 capsule by mouth daily.    Historical Provider, MD  cyanocobalamin 500 MCG tablet Take 500 mcg by mouth daily.    Historical  Provider, MD  ferrous fumarate-iron polysaccharide complex (TANDEM) 162-115.2 MG CAPS capsule Take 1 capsule by mouth daily with breakfast.    Historical Provider, MD  Fluticasone-Salmeterol (ADVAIR) 250-50 MCG/DOSE AEPB Inhale 1 puff into the lungs 2 (two) times daily.    Historical Provider, MD  furosemide (LASIX) 40 MG tablet Take 40 mg by mouth daily.    Historical Provider, MD  glimepiride (AMARYL) 2 MG tablet Take 2 mg by mouth daily with breakfast.    Historical Provider, MD  magnesium oxide (MAG-OX) 400 MG tablet Take 800 mg by mouth 3 (three) times daily.     Historical Provider, MD  meloxicam (MOBIC) 7.5 MG tablet Take 7.5 mg by mouth 2 (two) times daily as needed for pain.     Historical Provider, MD  metFORMIN (GLUCOPHAGE) 500 MG tablet Take  1,000 mg by mouth 2 (two) times daily with a meal.    Historical Provider, MD  metoprolol succinate (TOPROL-XL) 200 MG 24 hr tablet Take 1 tablet (200 mg total) by mouth daily. Take with or immediately following a meal. 05/23/15   Alisa Graff, FNP  montelukast (SINGULAIR) 10 MG tablet Take 10 mg by mouth at bedtime.    Historical Provider, MD  Multiple Vitamin (MULTIVITAMIN) tablet Take 1 tablet by mouth daily.     Historical Provider, MD  omeprazole (PRILOSEC) 20 MG capsule Take 20 mg by mouth 2 (two) times daily before a meal.    Historical Provider, MD  potassium chloride (K-DUR) 10 MEQ tablet Take 1 tablet (10 mEq total) by mouth daily. 10/07/15   Alisa Graff, FNP  predniSONE (DELTASONE) 20 MG tablet Take 2 tablets (40 mg total) by mouth daily. 02/04/16 02/09/16  Merlyn Lot, MD  rosuvastatin (CRESTOR) 20 MG tablet Take 20 mg by mouth daily.    Historical Provider, MD  sacubitril-valsartan (ENTRESTO) 97-103 MG Take 1 tablet by mouth 2 (two) times daily. 10/07/15   Alisa Graff, FNP  traZODone (DESYREL) 100 MG tablet Take 100 mg by mouth at bedtime.    Historical Provider, MD  vitamin C (ASCORBIC ACID) 500 MG tablet Take 1,000 mg by mouth daily.     Historical Provider, MD  vitamin E 400 UNIT capsule Take 400 Units by mouth daily.    Historical Provider, MD    Allergies Amoxicillin; Aspirin; Celebrex [celecoxib]; Latex; Penicillins; and Allegra [fexofenadine]    Social History Social History  Substance Use Topics  . Smoking status: Former Smoker    Packs/day: 1.00    Years: 15.00    Types: Cigarettes    Quit date: 07/21/1982  . Smokeless tobacco: Never Used  . Alcohol use 9.0 oz/week    15 Standard drinks or equivalent per week     Comment: quit drinking 04/1981    Review of Systems Patient denies headaches, rhinorrhea, blurry vision, numbness, shortness of breath, chest pain, edema, cough, abdominal pain, nausea, vomiting, diarrhea, dysuria, fevers, rashes or  hallucinations unless otherwise stated above in HPI. ____________________________________________   PHYSICAL EXAM:  VITAL SIGNS: Vitals:   02/04/16 2330 02/05/16 0000  BP: (!) 151/76 (!) 160/64  Pulse: 79 83  Resp: 12 17  Temp:      Constitutional: Alert and oriented.  in no acute distress. Eyes: Conjunctivae are normal. PERRL. EOMI. Head: Atraumatic. Nose: No congestion/rhinnorhea.  Walters inplace Mouth/Throat: Mucous membranes are moist.  Oropharynx non-erythematous. Neck: No stridor. Painless ROM. No cervical spine tenderness to palpation Hematological/Lymphatic/Immunilogical: No cervical lymphadenopathy. Cardiovascular: Normal rate, regular rhythm. Grossly  normal heart sounds.  Good peripheral circulation. Respiratory: mild prolonged expiratory phase with diffuse expiratory wheezing.  No retractions. Lungs CTAB. Gastrointestinal: Soft and nontender. No distention. No abdominal bruits. No CVA tenderness. Musculoskeletal: No lower extremity tenderness nor edema.  No joint effusions. Neurologic:  Normal speech and language. No gross focal neurologic deficits are appreciated. No gait instability. Skin:  Skin is warm, dry and intact. No rash noted. Psychiatric: Mood and affect are normal. Speech and behavior are normal.  ____________________________________________   LABS (all labs ordered are listed, but only abnormal results are displayed)  Results for orders placed or performed during the hospital encounter of 02/04/16 (from the past 24 hour(s))  CBC with Differential/Platelet     Status: Abnormal   Collection Time: 02/04/16 10:02 PM  Result Value Ref Range   WBC 9.1 3.6 - 11.0 K/uL   RBC 3.76 (L) 3.80 - 5.20 MIL/uL   Hemoglobin 11.1 (L) 12.0 - 16.0 g/dL   HCT 34.0 (L) 35.0 - 47.0 %   MCV 90.4 80.0 - 100.0 fL   MCH 29.5 26.0 - 34.0 pg   MCHC 32.6 32.0 - 36.0 g/dL   RDW 14.9 (H) 11.5 - 14.5 %   Platelets 261 150 - 440 K/uL   Neutrophils Relative % 75 %   Neutro Abs 6.8  (H) 1.4 - 6.5 K/uL   Lymphocytes Relative 18 %   Lymphs Abs 1.7 1.0 - 3.6 K/uL   Monocytes Relative 6 %   Monocytes Absolute 0.5 0.2 - 0.9 K/uL   Eosinophils Relative 1 %   Eosinophils Absolute 0.1 0 - 0.7 K/uL   Basophils Relative 0 %   Basophils Absolute 0.0 0 - 0.1 K/uL  Comprehensive metabolic panel     Status: Abnormal   Collection Time: 02/04/16 10:02 PM  Result Value Ref Range   Sodium 140 135 - 145 mmol/L   Potassium 3.7 3.5 - 5.1 mmol/L   Chloride 108 101 - 111 mmol/L   CO2 24 22 - 32 mmol/L   Glucose, Bld 134 (H) 65 - 99 mg/dL   BUN 10 6 - 20 mg/dL   Creatinine, Ser 0.77 0.44 - 1.00 mg/dL   Calcium 9.8 8.9 - 10.3 mg/dL   Total Protein 6.8 6.5 - 8.1 g/dL   Albumin 4.3 3.5 - 5.0 g/dL   AST 21 15 - 41 U/L   ALT 15 14 - 54 U/L   Alkaline Phosphatase 40 38 - 126 U/L   Total Bilirubin 0.5 0.3 - 1.2 mg/dL   GFR calc non Af Amer >60 >60 mL/min   GFR calc Af Amer >60 >60 mL/min   Anion gap 8 5 - 15  Magnesium     Status: Abnormal   Collection Time: 02/04/16 10:02 PM  Result Value Ref Range   Magnesium 1.6 (L) 1.7 - 2.4 mg/dL  Troponin I     Status: None   Collection Time: 02/04/16 10:02 PM  Result Value Ref Range   Troponin I <0.03 <0.03 ng/mL   ____________________________________________  EKG My review and personal interpretation at Time: 21:21   Indication: sob  Rate: 80  Rhythm: sinus Axis: normal Other: lbbb, no sgarbossa criteria ____________________________________________  RADIOLOGY  I personally reviewed all radiographic images ordered to evaluate for the above acute complaints and reviewed radiology reports and findings.  These findings were personally discussed with the patient.  Please see medical record for radiology report. ____________________________________________   PROCEDURES  Procedure(s) performed: none Procedures    Critical  Care performed: no ____________________________________________   INITIAL IMPRESSION / ASSESSMENT AND PLAN /  ED COURSE  Pertinent labs & imaging results that were available during my care of the patient were reviewed by me and considered in my medical decision making (see chart for details).  DDX: Asthma, copd, CHF, pna, ptx, malignancy, Pe, anemia   Azarria Yturralde is a 70 y.o. who presents to the ED with History of COPD presenting with cough and shortness of breath as described above. Patient without acute hypoxia. Does not appear to be in any acute distress. Less consistent with acute ischemia. No evidence of acute pneumonia. Will order nebulizer treatments as well as check a magnesium. We'll give dose of steroids here in the ER and continue to observe. Anticipate if there is no evidence of worsening symptoms after nebulizer treatment that she has to be stable for discharge.  Clinical Course as of Feb 04 22  Sat Feb 04, 2016  2246 Patient remains with normal saturations on her home 2 L nasal cannula. No significant tachypnea but given her worsening symptoms and evidence of acute hypomagnesemia Will treat with IV magnesium. Given her cough will treat for COPD exacerbation with Solu-Medrol as well as azithromycin.  [PR]    Clinical Course User Index [PR] Merlyn Lot, MD     ____________________________________________   FINAL CLINICAL IMPRESSION(S) / ED DIAGNOSES  Final diagnoses:  Cough  COPD exacerbation ([place])  Hypomagnesemia      NEW MEDICATIONS STARTED DURING THIS VISIT:  New Prescriptions   AZITHROMYCIN (ZITHROMAX Z-PAK) 250 MG TABLET    Take 1 tablet (250 mg) once daily on Days 2 through 5.   PREDNISONE (DELTASONE) 20 MG TABLET    Take 2 tablets (40 mg total) by mouth daily.     Note:  This document was prepared using Dragon voice recognition software and may include unintentional dictation errors.    Merlyn Lot, MD 02/05/16 Laureen Abrahams

## 2016-02-04 NOTE — ED Notes (Signed)
Report from susan, rn.  

## 2016-02-05 DIAGNOSIS — J441 Chronic obstructive pulmonary disease with (acute) exacerbation: Secondary | ICD-10-CM | POA: Diagnosis not present

## 2016-02-05 MED ORDER — ALBUTEROL SULFATE (2.5 MG/3ML) 0.083% IN NEBU
5.0000 mg | INHALATION_SOLUTION | Freq: Once | RESPIRATORY_TRACT | Status: AC
  Administered 2016-02-05: 5 mg via RESPIRATORY_TRACT
  Filled 2016-02-05: qty 6

## 2016-02-05 NOTE — ED Provider Notes (Signed)
Patient discharged by Dr. Quentin Cornwall but still with shortness of breath. I reassessed her and her lungs are clear to auscultation. However, when she stood up prior to me examining her to walk she became short of breath.  Patient given 2 more nebulizer treatments. Was able to ambulate without any difficulty and was subsequently discharged home.   Orbie Pyo, MD 02/05/16 [phone]

## 2016-02-05 NOTE — ED Notes (Signed)
Pt up to restroom to void, pt with increased work of breathing and dyspnea noted on exertion. Pox on arrival back to bed of 90% on ra. md notified, order received for albuterol.

## 2016-02-05 NOTE — ED Notes (Signed)
Pt ambulatory down hall without dyspnea. Pt states 'i fell good now, better, thanks". resps unlabored and pt able to speak in full sentences while ambulating.

## 2016-02-05 NOTE — ED Notes (Signed)
Wheezing in rul, clear in all other lobes.

## 2016-02-07 ENCOUNTER — Ambulatory Visit: Admission: RE | Admit: 2016-02-07 | Payer: Medicare Other | Source: Ambulatory Visit | Admitting: Gastroenterology

## 2016-02-07 ENCOUNTER — Encounter: Admission: RE | Payer: Self-pay | Source: Ambulatory Visit

## 2016-02-07 SURGERY — COLONOSCOPY WITH PROPOFOL
Anesthesia: General

## 2016-02-27 ENCOUNTER — Other Ambulatory Visit: Payer: Self-pay

## 2016-02-27 MED ORDER — ISOSORB DINITRATE-HYDRALAZINE 20-37.5 MG PO TABS: 1.0000 | ORAL_TABLET | Freq: Three times a day (TID) | ORAL | 3 refills | Status: DC

## 2016-04-18 ENCOUNTER — Ambulatory Visit: Payer: Medicare Other | Attending: Family | Admitting: Family

## 2016-04-18 ENCOUNTER — Encounter: Payer: Self-pay | Admitting: Family

## 2016-04-18 VITALS — BP 169/70 | HR 76 | Resp 18 | Ht 63.0 in | Wt 187.0 lb

## 2016-04-18 DIAGNOSIS — K219 Gastro-esophageal reflux disease without esophagitis: Secondary | ICD-10-CM | POA: Insufficient documentation

## 2016-04-18 DIAGNOSIS — Z8249 Family history of ischemic heart disease and other diseases of the circulatory system: Secondary | ICD-10-CM | POA: Insufficient documentation

## 2016-04-18 DIAGNOSIS — Z886 Allergy status to analgesic agent status: Secondary | ICD-10-CM | POA: Insufficient documentation

## 2016-04-18 DIAGNOSIS — Z880 Allergy status to penicillin: Secondary | ICD-10-CM | POA: Insufficient documentation

## 2016-04-18 DIAGNOSIS — Z7951 Long term (current) use of inhaled steroids: Secondary | ICD-10-CM | POA: Diagnosis not present

## 2016-04-18 DIAGNOSIS — Z87891 Personal history of nicotine dependence: Secondary | ICD-10-CM | POA: Insufficient documentation

## 2016-04-18 DIAGNOSIS — E785 Hyperlipidemia, unspecified: Secondary | ICD-10-CM | POA: Diagnosis not present

## 2016-04-18 DIAGNOSIS — I10 Essential (primary) hypertension: Secondary | ICD-10-CM

## 2016-04-18 DIAGNOSIS — G4733 Obstructive sleep apnea (adult) (pediatric): Secondary | ICD-10-CM | POA: Diagnosis not present

## 2016-04-18 DIAGNOSIS — Z79899 Other long term (current) drug therapy: Secondary | ICD-10-CM | POA: Insufficient documentation

## 2016-04-18 DIAGNOSIS — Z7984 Long term (current) use of oral hypoglycemic drugs: Secondary | ICD-10-CM | POA: Diagnosis not present

## 2016-04-18 DIAGNOSIS — Z91040 Latex allergy status: Secondary | ICD-10-CM | POA: Diagnosis not present

## 2016-04-18 DIAGNOSIS — Z7982 Long term (current) use of aspirin: Secondary | ICD-10-CM | POA: Diagnosis not present

## 2016-04-18 DIAGNOSIS — M1990 Unspecified osteoarthritis, unspecified site: Secondary | ICD-10-CM | POA: Insufficient documentation

## 2016-04-18 DIAGNOSIS — Z888 Allergy status to other drugs, medicaments and biological substances status: Secondary | ICD-10-CM | POA: Diagnosis not present

## 2016-04-18 DIAGNOSIS — I509 Heart failure, unspecified: Secondary | ICD-10-CM | POA: Diagnosis present

## 2016-04-18 DIAGNOSIS — J449 Chronic obstructive pulmonary disease, unspecified: Secondary | ICD-10-CM | POA: Diagnosis not present

## 2016-04-18 DIAGNOSIS — I5022 Chronic systolic (congestive) heart failure: Secondary | ICD-10-CM | POA: Diagnosis not present

## 2016-04-18 DIAGNOSIS — I110 Hypertensive heart disease with heart failure: Secondary | ICD-10-CM | POA: Diagnosis not present

## 2016-04-18 DIAGNOSIS — E119 Type 2 diabetes mellitus without complications: Secondary | ICD-10-CM | POA: Insufficient documentation

## 2016-04-18 MED ORDER — ISOSORB DINITRATE-HYDRALAZINE 20-37.5 MG PO TABS: 1.0000 | ORAL_TABLET | Freq: Three times a day (TID) | ORAL | 3 refills | Status: DC

## 2016-04-18 NOTE — Progress Notes (Signed)
Patient ID: Diane Patton, female    DOB: [birth year], 71 y.o.   MRN: [id number]  HPI  Diane Patton is a 71 y/o female with a history of osteoarthritis, obstructive sleep apnea, hyperlipidemia, HTN, GERD, DM, COPD, pulmonary HTN, remote tobacco use and chronic heart failure.  Last echo was done on 08/17/15 and showed an EF of 40% with mild MR, AR and TR. Mild/mod pulmonary HTN. This is a slight improvement from 12/06/13 when her EF was 35-40%. Had cardiac catheterization done 05/12/14.  Was in the ED on 02/04/16 for COPD exacerbation. Treated with nebulizers and discharged with antibiotics.   She presents today for a follow-up visit with fatigue and shortness of breath with moderate exertion. Denies any swelling in her legs/abdomen. Continues to weigh daily and reports a gradual weight gain as she admits that she hasn't been exercising much lately.   Past Medical History:  Diagnosis Date  . CHF (congestive heart failure) ([place])   . COPD (chronic obstructive pulmonary disease) ([place])   . Diabetes mellitus, type 2 ([place])   . GERD (gastroesophageal reflux disease)   . HTN (hypertension)   . Hyperlipidemia   . Obstructive sleep apnea on CPAP   . Osteoarthritis   . Scoliosis    Past Surgical History:  Procedure Laterality Date  . ankle (other)    . APPENDECTOMY    . BREAST SURGERY    . ear (otheR)    . feet (other)    . sinus (other)    . stomach     Family History  Problem Relation Age of Onset  . Alzheimer's disease Other   . Heart attack Mother   . Hypertension Mother   . Cancer Brother     lung  . Cancer Maternal Aunt     mouth and breast  . Cancer Daughter     throat   Social History  Substance Use Topics  . Smoking status: Former Smoker    Packs/day: 1.00    Years: 15.00    Types: Cigarettes    Quit date: 07/21/1982  . Smokeless tobacco: Never Used  . Alcohol use 9.0 oz/week    15 Standard drinks or equivalent per week     Comment: quit drinking 04/1981   Allergies   Allergen Reactions  . Amoxicillin Other (See Comments)    GI distress  . Aspirin Diarrhea and Nausea And Vomiting  . Celebrex [Celecoxib] Nausea Only  . Latex Itching and Swelling  . Penicillins Other (See Comments)    GI distress  . Allegra [Fexofenadine] Cough   Prior to Admission medications   Medication Sig Start Date End Date Taking? Authorizing Provider  albuterol (PROVENTIL HFA;VENTOLIN HFA) 108 (90 BASE) MCG/ACT inhaler Inhale 2 puffs into the lungs every 6 (six) hours as needed for wheezing or shortness of breath.   Yes Historical Provider, MD  aspirin 81 MG tablet Take 81 mg by mouth daily.   Yes Historical Provider, MD  Biotin 1000 MCG tablet Take 1,000 mcg by mouth daily.    Yes Historical Provider, MD  cetirizine (ZYRTEC) 10 MG tablet Take 10 mg by mouth daily.   Yes Historical Provider, MD  Cholecalciferol (D3-1000) 1000 UNITS capsule Take 1,000 Units by mouth daily.    Yes Historical Provider, MD  [facility] Liver Oil w/Vit A & D CAPS Take 1 capsule by mouth daily.   Yes Historical Provider, MD  cyanocobalamin 500 MCG tablet Take 500 mcg by mouth daily.   Yes Historical Provider, MD  ferrous fumarate-iron polysaccharide complex (TANDEM) 162-115.2 MG CAPS capsule Take 1 capsule by mouth daily with breakfast.   Yes Historical Provider, MD  Fluticasone-Salmeterol (ADVAIR) 250-50 MCG/DOSE AEPB Inhale 1 puff into the lungs 2 (two) times daily.   Yes Historical Provider, MD  furosemide (LASIX) 40 MG tablet Take 40 mg by mouth daily.   Yes Historical Provider, MD  glimepiride (AMARYL) 2 MG tablet Take 2 mg by mouth daily with breakfast.   Yes Historical Provider, MD  isosorbide-hydrALAZINE (BIDIL) 20-37.5 MG tablet Take 1 tablet by mouth 3 (three) times daily. 04/18/16  Yes Alisa Graff, FNP  magnesium oxide (MAG-OX) 400 MG tablet Take 800 mg by mouth 3 (three) times daily.    Yes Historical Provider, MD  meloxicam (MOBIC) 7.5 MG tablet Take 7.5 mg by mouth 2 (two) times daily as needed  for pain.    Yes Historical Provider, MD  metFORMIN (GLUCOPHAGE) 500 MG tablet Take 1,000 mg by mouth 2 (two) times daily with a meal.   Yes Historical Provider, MD  metoprolol succinate (TOPROL-XL) 200 MG 24 hr tablet Take 1 tablet (200 mg total) by mouth daily. Take with or immediately following a meal. 05/23/15  Yes Alisa Graff, FNP  montelukast (SINGULAIR) 10 MG tablet Take 10 mg by mouth at bedtime.   Yes Historical Provider, MD  Multiple Vitamin (MULTIVITAMIN) tablet Take 1 tablet by mouth daily.    Yes Historical Provider, MD  omeprazole (PRILOSEC) 20 MG capsule Take 20 mg by mouth 2 (two) times daily before a meal.   Yes Historical Provider, MD  potassium chloride (K-DUR) 10 MEQ tablet Take 1 tablet (10 mEq total) by mouth daily. 10/07/15  Yes Alisa Graff, FNP  rosuvastatin (CRESTOR) 20 MG tablet Take 20 mg by mouth daily.   Yes Historical Provider, MD  sacubitril-valsartan (ENTRESTO) 97-103 MG Take 1 tablet by mouth 2 (two) times daily. 10/07/15  Yes Alisa Graff, FNP  traZODone (DESYREL) 100 MG tablet Take 100 mg by mouth at bedtime.   Yes Historical Provider, MD  vitamin C (ASCORBIC ACID) 500 MG tablet Take 1,000 mg by mouth daily.    Yes Historical Provider, MD  vitamin E 400 UNIT capsule Take 400 Units by mouth daily.   Yes Historical Provider, MD    Review of Systems  Constitutional: Positive for fatigue. Negative for appetite change.  HENT: Positive for congestion, hearing loss and rhinorrhea. Negative for sore throat.   Eyes: Negative.   Respiratory: Positive for cough, shortness of breath and wheezing. Negative for chest tightness.   Cardiovascular: Negative for chest pain, palpitations and leg swelling.  Gastrointestinal: Negative for abdominal distention and abdominal pain.  Endocrine: Negative.   Genitourinary: Negative.   Musculoskeletal: Negative for back pain and neck pain.  Skin: Negative.   Allergic/Immunologic: Negative.   Neurological: Negative for dizziness  and light-headedness.  Hematological: Negative for adenopathy. Does not bruise/bleed easily.  Psychiatric/Behavioral: Negative for dysphoric mood, sleep disturbance (sleeping on 2 pillows) and suicidal ideas. The patient is not nervous/anxious.    Vitals:   04/18/16 1011  BP: (!) 169/70  Pulse: 76  Resp: 18  SpO2: 100%  Weight: 187 lb (84.8 kg)  Height: 5\' 3"  (1.6 m)   Wt Readings from Last 3 Encounters:  04/18/16 187 lb (84.8 kg)  02/04/16 180 lb (81.6 kg)  01/18/16 180 lb (81.6 kg)   Lab Results  Component Value Date   CREATININE 0.77 02/04/2016   CREATININE 0.82 11/01/2014  CREATININE 1.22 12/08/2013    Physical Exam  Constitutional: She is oriented to person, place, and time. She appears well-developed and well-nourished.  HENT:  Head: Normocephalic and atraumatic.  Eyes: Conjunctivae are normal. Pupils are equal, round, and reactive to light.  Neck: Normal range of motion. Neck supple. No JVD present.  Cardiovascular: Normal rate and regular rhythm.   Pulmonary/Chest: Effort normal. She has no wheezes. She has no rales.  Abdominal: Soft. She exhibits no distension. There is no tenderness.  Musculoskeletal: She exhibits no edema or tenderness.  Neurological: She is alert and oriented to person, place, and time.  Skin: Skin is warm and dry.  Psychiatric: She has a normal mood and affect. Her behavior is normal. Thought content normal.  Vitals reviewed.  Assessment & Plan:  1: Chronic heart failure with reduced ejection fraction- - NYHA class II - euvolemic - weight up 7 pounds since she was last here. Reminded to call for an overnight weight gain of >2 pounds or a weekly weight gain of >5 pounds - encouraged her to get back to her walking - on max entrestro and toprol. On bidil as well - saw her cardiologist ([facility]) on 01/09/16 and returns to him in 6 months - did receive her flu vaccine for the season already  2: HTN- - BP mildly elevated today but her  weight is up 7 pounds from last visit - recently saw her PCP Candiss Norse) on 03/28/16 and returns to her 09/26/16  3: Diabetes- - reports glucose of 130 today but it was after she ate - follows with PCP about her diabetes  Medication bottles were reviewed.  Return in 3 months or sooner for any questions/problems before then.

## 2016-04-18 NOTE — Patient Instructions (Signed)
Continue weighing daily and call for an overnight weight gain of > 2 pounds or a weekly weight gain of >5 pounds. 

## 2016-05-20 ENCOUNTER — Other Ambulatory Visit: Payer: Self-pay | Admitting: Family

## 2016-07-09 ENCOUNTER — Encounter: Payer: Self-pay | Admitting: *Deleted

## 2016-07-10 ENCOUNTER — Ambulatory Visit
Admission: RE | Admit: 2016-07-10 | Discharge: 2016-07-10 | Disposition: A | Discharge status: Home / Self Care | Payer: Medicare Other | Source: Ambulatory Visit | Attending: Gastroenterology | Admitting: Gastroenterology

## 2016-07-10 ENCOUNTER — Ambulatory Visit: Payer: Medicare Other | Admitting: Certified Registered Nurse Anesthetist

## 2016-07-10 ENCOUNTER — Encounter: Payer: Self-pay | Admitting: Certified Registered Nurse Anesthetist

## 2016-07-10 ENCOUNTER — Encounter
Admission: RE | Disposition: A | Discharge status: Home / Self Care | Payer: Self-pay | Source: Ambulatory Visit | Attending: Gastroenterology

## 2016-07-10 DIAGNOSIS — Z881 Allergy status to other antibiotic agents status: Secondary | ICD-10-CM | POA: Insufficient documentation

## 2016-07-10 DIAGNOSIS — Z886 Allergy status to analgesic agent status: Secondary | ICD-10-CM | POA: Diagnosis not present

## 2016-07-10 DIAGNOSIS — Z91040 Latex allergy status: Secondary | ICD-10-CM | POA: Insufficient documentation

## 2016-07-10 DIAGNOSIS — G4733 Obstructive sleep apnea (adult) (pediatric): Secondary | ICD-10-CM | POA: Diagnosis not present

## 2016-07-10 DIAGNOSIS — D509 Iron deficiency anemia, unspecified: Secondary | ICD-10-CM | POA: Insufficient documentation

## 2016-07-10 DIAGNOSIS — K3189 Other diseases of stomach and duodenum: Secondary | ICD-10-CM | POA: Diagnosis not present

## 2016-07-10 DIAGNOSIS — K259 Gastric ulcer, unspecified as acute or chronic, without hemorrhage or perforation: Secondary | ICD-10-CM | POA: Insufficient documentation

## 2016-07-10 DIAGNOSIS — K5730 Diverticulosis of large intestine without perforation or abscess without bleeding: Secondary | ICD-10-CM | POA: Insufficient documentation

## 2016-07-10 DIAGNOSIS — Z7984 Long term (current) use of oral hypoglycemic drugs: Secondary | ICD-10-CM | POA: Diagnosis not present

## 2016-07-10 DIAGNOSIS — K222 Esophageal obstruction: Secondary | ICD-10-CM | POA: Diagnosis not present

## 2016-07-10 DIAGNOSIS — I110 Hypertensive heart disease with heart failure: Secondary | ICD-10-CM | POA: Diagnosis not present

## 2016-07-10 DIAGNOSIS — Z888 Allergy status to other drugs, medicaments and biological substances status: Secondary | ICD-10-CM | POA: Insufficient documentation

## 2016-07-10 DIAGNOSIS — K2960 Other gastritis without bleeding: Secondary | ICD-10-CM | POA: Diagnosis not present

## 2016-07-10 DIAGNOSIS — Z87891 Personal history of nicotine dependence: Secondary | ICD-10-CM | POA: Diagnosis not present

## 2016-07-10 DIAGNOSIS — K210 Gastro-esophageal reflux disease with esophagitis: Secondary | ICD-10-CM | POA: Diagnosis not present

## 2016-07-10 DIAGNOSIS — E119 Type 2 diabetes mellitus without complications: Secondary | ICD-10-CM | POA: Insufficient documentation

## 2016-07-10 DIAGNOSIS — M1990 Unspecified osteoarthritis, unspecified site: Secondary | ICD-10-CM | POA: Diagnosis not present

## 2016-07-10 DIAGNOSIS — I509 Heart failure, unspecified: Secondary | ICD-10-CM | POA: Insufficient documentation

## 2016-07-10 DIAGNOSIS — E785 Hyperlipidemia, unspecified: Secondary | ICD-10-CM | POA: Insufficient documentation

## 2016-07-10 DIAGNOSIS — Z880 Allergy status to penicillin: Secondary | ICD-10-CM | POA: Insufficient documentation

## 2016-07-10 DIAGNOSIS — K449 Diaphragmatic hernia without obstruction or gangrene: Secondary | ICD-10-CM | POA: Insufficient documentation

## 2016-07-10 HISTORY — PX: COLONOSCOPY WITH PROPOFOL: SHX5780

## 2016-07-10 HISTORY — PX: ESOPHAGOGASTRODUODENOSCOPY (EGD) WITH PROPOFOL: SHX5813

## 2016-07-10 LAB — GLUCOSE, CAPILLARY: GLUCOSE-CAPILLARY: 87 mg/dL (ref 65–99)

## 2016-07-10 SURGERY — ESOPHAGOGASTRODUODENOSCOPY (EGD) WITH PROPOFOL
Anesthesia: General

## 2016-07-10 MED ORDER — MIDAZOLAM HCL 2 MG/2ML IJ SOLN
INTRAMUSCULAR | Status: AC
  Filled 2016-07-10: qty 2

## 2016-07-10 MED ORDER — GLYCOPYRROLATE 0.2 MG/ML IJ SOLN
INTRAMUSCULAR | Status: AC
  Filled 2016-07-10: qty 1

## 2016-07-10 MED ORDER — MIDAZOLAM HCL 2 MG/2ML IJ SOLN
INTRAMUSCULAR | Status: DC | PRN
  Administered 2016-07-10 (×2): 1 mg via INTRAVENOUS

## 2016-07-10 MED ORDER — PROPOFOL 500 MG/50ML IV EMUL
INTRAVENOUS | Status: AC
  Filled 2016-07-10: qty 50

## 2016-07-10 MED ORDER — PROPOFOL 500 MG/50ML IV EMUL
INTRAVENOUS | Status: DC | PRN
  Administered 2016-07-10: 125 ug/kg/min via INTRAVENOUS

## 2016-07-10 MED ORDER — PROPOFOL 10 MG/ML IV BOLUS
INTRAVENOUS | Status: DC | PRN
  Administered 2016-07-10 (×2): 20 mg via INTRAVENOUS
  Administered 2016-07-10: 30 mg via INTRAVENOUS

## 2016-07-10 MED ORDER — PHENYLEPHRINE HCL 10 MG/ML IJ SOLN
INTRAMUSCULAR | Status: DC | PRN
  Administered 2016-07-10: 200 ug via INTRAVENOUS
  Administered 2016-07-10: 100 ug via INTRAVENOUS

## 2016-07-10 MED ORDER — GLYCOPYRROLATE 0.2 MG/ML IJ SOLN
INTRAMUSCULAR | Status: DC | PRN
  Administered 2016-07-10: 0.2 mg via INTRAVENOUS

## 2016-07-10 MED ORDER — LIDOCAINE HCL (CARDIAC) 20 MG/ML IV SOLN
INTRAVENOUS | Status: DC | PRN
  Administered 2016-07-10: 30 mg via INTRAVENOUS

## 2016-07-10 MED ORDER — SODIUM CHLORIDE 0.9 % IV SOLN
INTRAVENOUS | Status: DC
  Administered 2016-07-10: 08:00:00 via INTRAVENOUS

## 2016-07-10 MED ORDER — SODIUM CHLORIDE 0.9 % IV SOLN: INTRAVENOUS | Status: DC

## 2016-07-10 MED ORDER — PROPOFOL 10 MG/ML IV BOLUS
INTRAVENOUS | Status: AC
  Filled 2016-07-10: qty 20

## 2016-07-10 NOTE — Op Note (Signed)
[facility] Patient Name: Diane Patton Procedure Date: 07/10/2016 8:48 AM MRN: [id number] Account #: [id number] Date of Birth: June 17, 1945 Admit Type: Outpatient Age: 71 Room: [facility] ENDO ROOM 3 Gender: Female Note Status: Finalized Procedure:            Colonoscopy Indications:          Iron deficiency anemia Providers:            Lollie Sails, MD Referring MD:         Glendon Axe (Referring MD) Medicines:            Monitored Anesthesia Care Complications:        No immediate complications. Procedure:            Pre-Anesthesia Assessment:                       - ASA Grade Assessment: III - A patient with severe                        systemic disease.                       After obtaining informed consent, the colonoscope was                        passed under direct vision. Throughout the procedure,                        the patient's blood pressure, pulse, and oxygen                        saturations were monitored continuously. The Olympus                        PCF-H180AL colonoscope ( S#: [id number] ) was introduced                        through the anus and advanced to the the cecum,                        identified by appendiceal orifice and ileocecal valve.                        The colonoscopy was performed with moderate difficulty                        due to poor bowel prep. Successful completion of the                        procedure was aided by lavage. Findings:      Many small and large-mouthed diverticula were found in the sigmoid       colon, descending colon, transverse colon and ascending colon.      The exam was otherwise without abnormality.      The digital rectal exam was normal.      The retroflexed view of the distal rectum and anal verge was normal and       showed no anal or rectal abnormalities. Impression:           - Diverticulosis in the sigmoid colon, in the  descending colon, in  the transverse colon and in the                        ascending colon.                       - The examination was otherwise normal.                       - The distal rectum and anal verge are normal on                        retroflexion view.                       - No specimens collected. Recommendation:       - Discharge patient to home.                       - Advance diet as tolerated.                       - No aspirin, ibuprofen, naproxen, or other                        non-steroidal anti-inflammatory drugs. Procedure Code(s):    --- Professional ---                       [id number], Colonoscopy, flexible; diagnostic, including                        collection of specimen(s) by brushing or washing, when                        performed (separate procedure) Diagnosis Code(s):    --- Professional ---                       D50.9, Iron deficiency anemia, unspecified                       K57.30, Diverticulosis of large intestine without                        perforation or abscess without bleeding CPT copyright 2016 [employer]. All rights reserved. The codes documented in this report are preliminary and upon coder review may  be revised to meet current compliance requirements. Lollie Sails, MD 07/10/2016 9:46:49 AM This report has been signed electronically. Number of Addenda: 0 Note Initiated On: 07/10/2016 8:48 AM Scope Withdrawal Time: 0 hours 4 minutes 45 seconds  Total Procedure Duration: 0 hours 10 minutes 54 seconds       [facility]

## 2016-07-10 NOTE — Anesthesia Post-op Follow-up Note (Cosign Needed)
Anesthesia QCDR form completed.        

## 2016-07-10 NOTE — Anesthesia Preprocedure Evaluation (Addendum)
Anesthesia Evaluation  Patient identified by MRN, date of birth, ID band Patient awake    Reviewed: Allergy & Precautions, NPO status , Patient's Chart, lab work & pertinent test results, reviewed documented beta blocker date and time   Airway Mallampati: II  TM Distance: >3 FB     Dental  (+) Chipped, Upper Dentures, Lower Dentures   Pulmonary sleep apnea and Continuous Positive Airway Pressure Ventilation , COPD, former smoker,           Cardiovascular hypertension, Pt. on medications and Pt. on home beta blockers +CHF       Neuro/Psych    GI/Hepatic GERD  ,  Endo/Other  diabetes, Type 2  Renal/GU      Musculoskeletal  (+) Arthritis ,   Abdominal   Peds  Hematology   Anesthesia Other Findings   Reproductive/Obstetrics                            Anesthesia Physical Anesthesia Plan  ASA: III  Anesthesia Plan: General   Post-op Pain Management:    Induction: Intravenous  Airway Management Planned:   Additional Equipment:   Intra-op Plan:   Post-operative Plan:   Informed Consent: I have reviewed the patients History and Physical, chart, labs and discussed the procedure including the risks, benefits and alternatives for the proposed anesthesia with the patient or authorized representative who has indicated his/her understanding and acceptance.     Plan Discussed with: CRNA  Anesthesia Plan Comments:         Anesthesia Quick Evaluation

## 2016-07-10 NOTE — Transfer of Care (Signed)
Immediate Anesthesia Transfer of Care Note  Patient: Diane Patton  Procedure(s) Performed: Procedure(s): ESOPHAGOGASTRODUODENOSCOPY (EGD) WITH PROPOFOL (N/A) COLONOSCOPY WITH PROPOFOL (N/A)  Patient Location: PACU  Anesthesia Type:General  Level of Consciousness: sedated  Airway & Oxygen Therapy: Patient Spontanous Breathing and Patient connected to nasal cannula oxygen  Post-op Assessment: Report given to RN and Post -op Vital signs reviewed and stable  Post vital signs: Reviewed and stable  Last Vitals:  Vitals:   07/10/16 0950 07/10/16 0951  BP: (!) 96/49 (!) 96/49  Pulse: 96 93  Resp: 19 20  Temp: 36.5 C 36.5 C    Last Pain:  Vitals:   07/10/16 0950  TempSrc: Tympanic      Patients Stated Pain Goal: 0 (43/14/27 6701)  Complications: No apparent anesthesia complications

## 2016-07-10 NOTE — Anesthesia Postprocedure Evaluation (Signed)
Anesthesia Post Note  Patient: Diane Patton  Procedure(s) Performed: Procedure(s) (LRB): ESOPHAGOGASTRODUODENOSCOPY (EGD) WITH PROPOFOL (N/A) COLONOSCOPY WITH PROPOFOL (N/A)  Patient location during evaluation: Endoscopy Anesthesia Type: General Level of consciousness: awake and alert Pain management: pain level controlled Vital Signs Assessment: post-procedure vital signs reviewed and stable Respiratory status: spontaneous breathing, nonlabored ventilation, respiratory function stable and patient connected to nasal cannula oxygen Cardiovascular status: blood pressure returned to baseline and stable Postop Assessment: no signs of nausea or vomiting Anesthetic complications: no     Last Vitals:  Vitals:   07/10/16 1001 07/10/16 1021  BP: (!) 116/54 (!) 142/89  Pulse: 92   Resp: 14   Temp:      Last Pain:  Vitals:   07/10/16 0950  TempSrc: Tympanic                 Devontre Siedschlag S

## 2016-07-10 NOTE — Anesthesia Procedure Notes (Signed)
Date/Time: 07/10/2016 9:00 AM Performed by: Johnna Acosta Pre-anesthesia Checklist: Patient identified, Emergency Drugs available, Suction available, Patient being monitored and Timeout performed Patient Re-evaluated:Patient Re-evaluated prior to inductionOxygen Delivery Method: Nasal cannula

## 2016-07-10 NOTE — Op Note (Signed)
[facility] Gastroenterology Patient Name: Diane Patton Procedure Date: 07/10/2016 8:49 AM MRN: [id number] Account #: [id number] Date of Birth: 1946/02/14 Admit Type: Outpatient Age: 71 Room: [facility] ENDO ROOM 3 Gender: Female Note Status: Finalized Procedure:            Upper GI endoscopy Indications:          Iron deficiency anemia Providers:            Lollie Sails, MD Referring MD:         Glendon Axe (Referring MD) Medicines:            Monitored Anesthesia Care Complications:        No immediate complications. Procedure:            Pre-Anesthesia Assessment:                       - ASA Grade Assessment: III - A patient with severe                        systemic disease.                       After obtaining informed consent, the endoscope was                        passed under direct vision. Throughout the procedure,                        the patient's blood pressure, pulse, and oxygen                        saturations were monitored continuously. The Endoscope                        was introduced through the mouth, and advanced to the                        third part of duodenum. The upper GI endoscopy was                        accomplished without difficulty. The patient tolerated                        the procedure well. Findings:      The Z-line was variable. Biopsies were taken with a cold forceps for       histology.      A widely patent and non-obstructing Schatzki ring (acquired) was found       at the gastroesophageal junction.      A small hiatal hernia was found. The Z-line was a variable distance from       incisors; the hiatal hernia was sliding.      Patchy moderate inflammation characterized by adherent blood, congestion       (edema), erosions, granularity and shallow ulcerations was found in the       gastric antrum. Biopsies were taken with a cold forceps for histology.      One non-bleeding linear gastric ulcer with no  stigmata of bleeding was       found on the posterior wall of the gastric antrum. The lesion was 3 mm       in  largest dimension. Biopsies were taken with a cold forceps for       histology.      A single 10 mm submucosal papule (nodule) with no bleeding and stigmata       of recent bleeding was found on the posterior wall of the gastric       antrum. Biopsies were taken with a cold forceps for histology.      The cardia and gastric fundus were normal on retroflexion otherwise.      The examined duodenum was normal. Impression:           - Z-line variable. Biopsied.                       - Widely patent and non-obstructing Schatzki ring.                       - Small hiatal hernia.                       - Erosive gastritis. Biopsied.                       - Non-bleeding gastric ulcer with no stigmata of                        bleeding. Biopsied.                       - A single submucosal papule (nodule) found in the                        stomach. Biopsied.                       - Normal examined duodenum. Recommendation:       - Discharge patient to home.                       - Use Protonix (pantoprazole) 40 mg PO BID for 1 month.                       - Use Protonix (pantoprazole) 40 mg PO daily.                       - Repeat upper endoscopy in 7 weeks to check healing. Procedure Code(s):    --- Professional ---                       [phone], Esophagogastroduodenoscopy, flexible, transoral;                        with biopsy, single or multiple Diagnosis Code(s):    --- Professional ---                       K22.8, Other specified diseases of esophagus                       K22.2, Esophageal obstruction                       K44.9, Diaphragmatic hernia without obstruction or  gangrene                       K29.60, Other gastritis without bleeding                       K25.9, Gastric ulcer, unspecified as acute or chronic,                        without  hemorrhage or perforation                       K31.89, Other diseases of stomach and duodenum                       D50.9, Iron deficiency anemia, unspecified CPT copyright 2016 American Medical Association. All rights reserved. The codes documented in this report are preliminary and upon coder review may  be revised to meet current compliance requirements. Lollie Sails, MD 07/10/2016 9:29:31 AM This report has been signed electronically. Number of Addenda: 0 Note Initiated On: 07/10/2016 8:49 AM      [facility]

## 2016-07-10 NOTE — H&P (Signed)
Outpatient short stay form Pre-procedure 07/10/2016 8:57 AM Lollie Sails MD  Primary Physician: Dr. Glendon Axe  Reason for visit:  EGD and colonoscopy  History of present illness:  Patient is a 71 year old female presenting today for further evaluation in regards to iron deficiency anemia. Her last colonoscopy was about 10 years ago. She does take Motrin twice daily. She tolerated her prep well. She does take a 81 mg aspirin daily as well.    Current Facility-Administered Medications:  .  0.9 %  sodium chloride infusion, , Intravenous, Continuous, Lollie Sails, MD .  0.9 %  sodium chloride infusion, , Intravenous, Continuous, Lollie Sails, MD  Prescriptions Prior to Admission  Medication Sig Dispense Refill Last Dose  . albuterol (PROVENTIL HFA;VENTOLIN HFA) 108 (90 BASE) MCG/ACT inhaler Inhale 2 puffs into the lungs every 6 (six) hours as needed for wheezing or shortness of breath.   Past Week at Unknown time  . aspirin 81 MG tablet Take 81 mg by mouth daily.   Past Week at Unknown time  . Biotin 1000 MCG tablet Take 1,000 mcg by mouth daily.    Past Week at Unknown time  . cetirizine (ZYRTEC) 10 MG tablet Take 10 mg by mouth daily.   Past Week at Unknown time  . Cholecalciferol (D3-1000) 1000 UNITS capsule Take 1,000 Units by mouth daily.    Past Week at Unknown time  . Cod Liver Oil w/Vit A & D CAPS Take 1 capsule by mouth daily.   Past Week at Unknown time  . cyanocobalamin 500 MCG tablet Take 500 mcg by mouth daily.   Past Week at Unknown time  . ferrous fumarate-iron polysaccharide complex (TANDEM) 162-115.2 MG CAPS capsule Take 1 capsule by mouth daily with breakfast.   Past Week at Unknown time  . Fluticasone-Salmeterol (ADVAIR) 250-50 MCG/DOSE AEPB Inhale 1 puff into the lungs 2 (two) times daily.   Past Week at Unknown time  . furosemide (LASIX) 40 MG tablet Take 40 mg by mouth daily.   Past Week at Unknown time  . glimepiride (AMARYL) 2 MG tablet Take 2 mg by  mouth daily with breakfast.   07/09/2016 at Unknown time  . isosorbide-hydrALAZINE (BIDIL) 20-37.5 MG tablet Take 1 tablet by mouth 3 (three) times daily. 270 tablet 3 07/09/2016 at Unknown time  . magnesium oxide (MAG-OX) 400 MG tablet Take 800 mg by mouth 3 (three) times daily.    Past Week at Unknown time  . meloxicam (MOBIC) 7.5 MG tablet Take 7.5 mg by mouth 2 (two) times daily as needed for pain.    Past Week at Unknown time  . metFORMIN (GLUCOPHAGE) 500 MG tablet Take 1,000 mg by mouth 2 (two) times daily with a meal.   Past Week at Unknown time  . metoprolol (TOPROL-XL) 200 MG 24 hr tablet TAKE ONE TABLET BY MOUTH ONCE DAILY. TAKE WITH OR IMMEDIATELY FOLLOWING A MEAL. 90 tablet 3 07/09/2016 at Unknown time  . montelukast (SINGULAIR) 10 MG tablet Take 10 mg by mouth at bedtime.   07/09/2016 at Unknown time  . Multiple Vitamin (MULTIVITAMIN) tablet Take 1 tablet by mouth daily.    Past Week at Unknown time  . omeprazole (PRILOSEC) 20 MG capsule Take 20 mg by mouth 2 (two) times daily before a meal.   Past Week at Unknown time  . potassium chloride (K-DUR) 10 MEQ tablet Take 1 tablet (10 mEq total) by mouth daily. 90 tablet 3 Past Week at Unknown time  .  rosuvastatin (CRESTOR) 20 MG tablet Take 20 mg by mouth daily.   Past Week at Unknown time  . sacubitril-valsartan (ENTRESTO) 97-103 MG Take 1 tablet by mouth 2 (two) times daily. 180 tablet 3 07/09/2016 at Unknown time  . traZODone (DESYREL) 100 MG tablet Take 100 mg by mouth at bedtime.   Past Week at Unknown time  . vitamin C (ASCORBIC ACID) 500 MG tablet Take 1,000 mg by mouth daily.    Past Week at Unknown time  . vitamin E 400 UNIT capsule Take 400 Units by mouth daily.   Past Week at Unknown time     Allergies  Allergen Reactions  . Amoxicillin Other (See Comments)    GI distress  . Aspirin Diarrhea and Nausea And Vomiting  . Celebrex [Celecoxib] Nausea Only  . Latex Itching and Swelling  . Penicillins Other (See Comments)    GI  distress  . Allegra [Fexofenadine] Cough     Past Medical History:  Diagnosis Date  . CHF (congestive heart failure) ([place])   . COPD (chronic obstructive pulmonary disease) ([place])   . Diabetes mellitus, type 2 ([place])   . GERD (gastroesophageal reflux disease)   . HTN (hypertension)   . Hyperlipidemia   . Obstructive sleep apnea on CPAP   . Osteoarthritis   . Scoliosis     Review of systems:      Physical Exam    Heart and lungs: Regular rate and rhythm without rub or gallop, lungs are bilaterally clear.    HEENT: Normocephalic atraumatic eyes are anicteric    Other:     Pertinant exam for procedure: Soft nontender nondistended bowel sounds positive normoactive.    Planned proceedures: Colonoscopy and indicated procedures. I have discussed the risks benefits and complications of procedures to include not limited to bleeding, infection, perforation and the risk of sedation and the patient wishes to proceed.    Lollie Sails, MD Gastroenterology 07/10/2016  8:57 AM

## 2016-07-11 LAB — SURGICAL PATHOLOGY

## 2016-07-12 ENCOUNTER — Encounter: Payer: Self-pay | Admitting: Gastroenterology

## 2016-07-13 ENCOUNTER — Ambulatory Visit: Payer: Medicare Other | Attending: Family | Admitting: Family

## 2016-07-13 ENCOUNTER — Encounter: Payer: Self-pay | Admitting: Family

## 2016-07-13 VITALS — BP 142/53 | HR 80 | Resp 20 | Ht 63.0 in | Wt 178.5 lb

## 2016-07-13 DIAGNOSIS — E119 Type 2 diabetes mellitus without complications: Secondary | ICD-10-CM | POA: Diagnosis not present

## 2016-07-13 DIAGNOSIS — Z87891 Personal history of nicotine dependence: Secondary | ICD-10-CM | POA: Insufficient documentation

## 2016-07-13 DIAGNOSIS — Z808 Family history of malignant neoplasm of other organs or systems: Secondary | ICD-10-CM | POA: Diagnosis not present

## 2016-07-13 DIAGNOSIS — I5022 Chronic systolic (congestive) heart failure: Secondary | ICD-10-CM

## 2016-07-13 DIAGNOSIS — Z803 Family history of malignant neoplasm of breast: Secondary | ICD-10-CM | POA: Diagnosis not present

## 2016-07-13 DIAGNOSIS — Z79899 Other long term (current) drug therapy: Secondary | ICD-10-CM | POA: Insufficient documentation

## 2016-07-13 DIAGNOSIS — Z7984 Long term (current) use of oral hypoglycemic drugs: Secondary | ICD-10-CM | POA: Insufficient documentation

## 2016-07-13 DIAGNOSIS — Z880 Allergy status to penicillin: Secondary | ICD-10-CM | POA: Diagnosis not present

## 2016-07-13 DIAGNOSIS — I10 Essential (primary) hypertension: Secondary | ICD-10-CM

## 2016-07-13 DIAGNOSIS — K219 Gastro-esophageal reflux disease without esophagitis: Secondary | ICD-10-CM | POA: Insufficient documentation

## 2016-07-13 DIAGNOSIS — Z801 Family history of malignant neoplasm of trachea, bronchus and lung: Secondary | ICD-10-CM | POA: Diagnosis not present

## 2016-07-13 DIAGNOSIS — E785 Hyperlipidemia, unspecified: Secondary | ICD-10-CM | POA: Diagnosis not present

## 2016-07-13 DIAGNOSIS — Z9889 Other specified postprocedural states: Secondary | ICD-10-CM | POA: Insufficient documentation

## 2016-07-13 DIAGNOSIS — I110 Hypertensive heart disease with heart failure: Secondary | ICD-10-CM | POA: Insufficient documentation

## 2016-07-13 DIAGNOSIS — M1990 Unspecified osteoarthritis, unspecified site: Secondary | ICD-10-CM | POA: Diagnosis not present

## 2016-07-13 DIAGNOSIS — Z8249 Family history of ischemic heart disease and other diseases of the circulatory system: Secondary | ICD-10-CM | POA: Insufficient documentation

## 2016-07-13 DIAGNOSIS — Z7982 Long term (current) use of aspirin: Secondary | ICD-10-CM | POA: Diagnosis not present

## 2016-07-13 NOTE — Progress Notes (Signed)
Patient ID: Diane Patton, female    DOB: November 20, 1945, 71 y.o.   MRN: [id number]  HPI  Diane Patton is a 71 y/o female with a history of osteoarthritis, obstructive sleep apnea, hyperlipidemia, HTN, GERD, DM, COPD, pulmonary HTN, remote tobacco use and chronic heart failure.  Last echo was done on 08/17/15 and showed an EF of 40% with mild MR, AR and TR. Mild/mod pulmonary HTN. This is a slight improvement from 12/06/13 when her EF was 35-40%. Had cardiac catheterization done 05/12/14.  Was in the ED on 02/04/16 for COPD exacerbation. Treated with nebulizers and discharged with antibiotics.   She presents today for a follow-up visit today with a chief complaint of some chest pain 2-3 times a week. Denies swelling in her legs and palpitations. She continues to have shortness of breath and cough with congestion. This is accompanied by a runny nose and ear pain in which she has had since last August. Taking an ear antibiotic for this, unsure which kind. She recently lost her daughter on the 12th of May and has had a decreased appetite and felt a little run down. She feels as if she has support from family during this time.   Past Medical History:  Diagnosis Date  . CHF (congestive heart failure) ([place])   . COPD (chronic obstructive pulmonary disease) ([place])   . Diabetes mellitus, type 2 ([place])   . GERD (gastroesophageal reflux disease)   . HTN (hypertension)   . Hyperlipidemia   . Obstructive sleep apnea on CPAP   . Osteoarthritis   . Scoliosis    Past Surgical History:  Procedure Laterality Date  . ankle (other)    . APPENDECTOMY    . BREAST SURGERY    . COLONOSCOPY WITH PROPOFOL N/A 07/10/2016   Procedure: COLONOSCOPY WITH PROPOFOL;  Surgeon: Lollie Sails, MD;  Location: [facility] ENDOSCOPY;  Service: Endoscopy;  Laterality: N/A;  . ear (otheR)    . ESOPHAGOGASTRODUODENOSCOPY (EGD) WITH PROPOFOL N/A 07/10/2016   Procedure: ESOPHAGOGASTRODUODENOSCOPY (EGD) WITH PROPOFOL;  Surgeon: Lollie Sails, MD;  Location: [facility] ENDOSCOPY;  Service: Endoscopy;  Laterality: N/A;  . feet (other)    . sinus (other)    . stomach     Family History  Problem Relation Age of Onset  . Alzheimer's disease Other   . Heart attack Mother   . Hypertension Mother   . Cancer Brother        lung  . Cancer Maternal Aunt        mouth and breast  . Cancer Daughter        throat   Social History  Substance Use Topics  . Smoking status: Former Smoker    Packs/day: 1.00    Years: 15.00    Types: Cigarettes    Quit date: 07/21/1982  . Smokeless tobacco: Never Used  . Alcohol use No     Comment: quit drinking 04/1981   Allergies  Allergen Reactions  . Amoxicillin Other (See Comments)    GI distress  . Aspirin Diarrhea and Nausea And Vomiting  . Celebrex [Celecoxib] Nausea Only  . Latex Itching and Swelling  . Penicillins Other (See Comments)    GI distress  . Allegra [Fexofenadine] Cough   Prior to Admission medications   Medication Sig Start Date End Date Taking? Authorizing Provider  albuterol (PROVENTIL HFA;VENTOLIN HFA) 108 (90 BASE) MCG/ACT inhaler Inhale 2 puffs into the lungs every 6 (six) hours as needed for wheezing or shortness of breath.  Yes Historical Provider, MD  aspirin 81 MG tablet Take 81 mg by mouth daily.   Yes Historical Provider, MD  Biotin 1000 MCG tablet Take 1,000 mcg by mouth daily.    Yes Historical Provider, MD  cetirizine (ZYRTEC) 10 MG tablet Take 10 mg by mouth daily.   Yes Historical Provider, MD  Cholecalciferol (D3-1000) 1000 UNITS capsule Take 1,000 Units by mouth daily.    Yes Historical Provider, MD  [facility] Liver Oil w/Vit A & D CAPS Take 1 capsule by mouth daily.   Yes Historical Provider, MD  cyanocobalamin 500 MCG tablet Take 500 mcg by mouth daily.   Yes Historical Provider, MD  ferrous fumarate-iron polysaccharide complex (TANDEM) 162-115.2 MG CAPS capsule Take 1 capsule by mouth daily with breakfast.   Yes Historical Provider, MD  Fluticasone-Salmeterol  (ADVAIR) 250-50 MCG/DOSE AEPB Inhale 1 puff into the lungs 2 (two) times daily.   Yes Historical Provider, MD  furosemide (LASIX) 40 MG tablet Take 40 mg by mouth daily.   Yes Historical Provider, MD  glimepiride (AMARYL) 2 MG tablet Take 2 mg by mouth daily with breakfast.   Yes Historical Provider, MD  isosorbide-hydrALAZINE (BIDIL) 20-37.5 MG tablet Take 1 tablet by mouth 3 (three) times daily. 04/18/16  Yes Alisa Graff, FNP  magnesium oxide (MAG-OX) 400 MG tablet Take 800 mg by mouth 3 (three) times daily.    Yes Historical Provider, MD  meloxicam (MOBIC) 7.5 MG tablet Take 7.5 mg by mouth 2 (two) times daily as needed for pain.    Yes Historical Provider, MD  metFORMIN (GLUCOPHAGE) 500 MG tablet Take 1,000 mg by mouth 2 (two) times daily with a meal.   Yes Historical Provider, MD  metoprolol succinate (TOPROL-XL) 200 MG 24 hr tablet Take 1 tablet (200 mg total) by mouth daily. Take with or immediately following a meal. 05/23/15  Yes Alisa Graff, FNP  montelukast (SINGULAIR) 10 MG tablet Take 10 mg by mouth at bedtime.   Yes Historical Provider, MD  Multiple Vitamin (MULTIVITAMIN) tablet Take 1 tablet by mouth daily.    Yes Historical Provider, MD  omeprazole (PRILOSEC) 20 MG capsule Take 20 mg by mouth 2 (two) times daily before a meal.   Yes Historical Provider, MD  potassium chloride (K-DUR) 10 MEQ tablet Take 1 tablet (10 mEq total) by mouth daily. 10/07/15  Yes Alisa Graff, FNP  rosuvastatin (CRESTOR) 20 MG tablet Take 20 mg by mouth daily.   Yes Historical Provider, MD  sacubitril-valsartan (ENTRESTO) 97-103 MG Take 1 tablet by mouth 2 (two) times daily. 10/07/15  Yes Alisa Graff, FNP  traZODone (DESYREL) 100 MG tablet Take 100 mg by mouth at bedtime.   Yes Historical Provider, MD  vitamin C (ASCORBIC ACID) 500 MG tablet Take 1,000 mg by mouth daily.    Yes Historical Provider, MD  vitamin E 400 UNIT capsule Take 400 Units by mouth daily.   Yes Historical Provider, MD    Review of  Systems  Constitutional: Positive for appetite change (Lost daughter on the 12th). Negative for fatigue.  HENT: Positive for congestion, hearing loss (infection; taking ear drops) and rhinorrhea. Negative for sore throat.   Eyes: Negative.   Respiratory: Positive for shortness of breath and wheezing. Negative for cough and chest tightness.   Cardiovascular: Positive for chest pain. Negative for palpitations and leg swelling.  Gastrointestinal: Negative for abdominal distention and abdominal pain.  Endocrine: Negative.   Genitourinary: Negative.   Musculoskeletal: Negative for back  pain and neck pain.  Skin: Negative.   Allergic/Immunologic: Negative.   Neurological: Negative for dizziness and light-headedness.  Hematological: Negative for adenopathy. Does not bruise/bleed easily.  Psychiatric/Behavioral: Negative for dysphoric mood, sleep disturbance (sleeping on 2 pillows) and suicidal ideas. The patient is not nervous/anxious.    Vitals:   07/13/16 1010  BP: (!) 142/53  Pulse: 80  Resp: 20  SpO2: 100%  Weight: 178 lb 8 oz (81 kg)  Height: 5\' 3"  (1.6 m)   Wt Readings from Last 3 Encounters:  07/13/16 178 lb 8 oz (81 kg)  07/10/16 180 lb (81.6 kg)  04/18/16 187 lb (84.8 kg)   Lab Results  Component Value Date   CREATININE 0.77 02/04/2016   CREATININE 0.82 11/01/2014   CREATININE 1.22 12/08/2013    Physical Exam  Constitutional: She is oriented to person, place, and time. She appears well-developed and well-nourished.  HENT:  Head: Normocephalic and atraumatic.  Neck: Normal range of motion. Neck supple. No JVD present.  Cardiovascular: Normal rate and regular rhythm.   Pulmonary/Chest: Effort normal. She has no wheezes. She has no rales.  Abdominal: Soft. She exhibits no distension. There is no tenderness.  Musculoskeletal: She exhibits no edema or tenderness.  Neurological: She is alert and oriented to person, place, and time.  Skin: Skin is warm and dry.   Psychiatric: She has a normal mood and affect. Her behavior is normal. Thought content normal.  Nursing note and vitals reviewed.  Assessment & Plan:  1: Chronic heart failure with reduced ejection fraction- - NYHA class II - euvolemic - only using salt on baked potato; encouraged only 2000 mg per day of salt - weight down 9 pounds since she was last here. Reminded to call for an overnight weight gain of >2 pounds or a weekly weight gain of >5 pounds - encouraged her to weigh daily on her new scale - she is walking more and wants to continue to lose weight  - drinking around 30 ounces a day. Counseled on drinking between 45-50 ounces in a day - on max entrestro and toprol. On bidil as well - saw her cardiologist ([place]) on 01/09/16 and returns to him on May 31st; encouraged her to talk to him about chest pain  2: HTN- - BP better than last visit 142/53 - recently saw her PCP Candiss Norse) on 03/28/16 and returns to her 09/26/16  3: Diabetes- - checks glucose 4x/week and says it is "good"; no numbers reported - follows with PCP about her diabetes  4. Arthritis- - uses meloxicam almost daily for pain and has been on this for 8-10 years; encouraged her to decrease use and to use APAP for pain  5. Colonoscopy-  - revealed diverticulosis and hitial hernia - using omeprazole and pantoprazole; told her to only use pantoprazole for now as they are in the same class of medications  Medication bottles were reviewed.  Return in 6 months or sooner for any questions/problems before then.

## 2016-07-13 NOTE — Patient Instructions (Signed)
Continue weighing daily and call for an overnight weight gain of > 2 pounds or a weekly weight gain of >5 pounds. 

## 2016-07-13 NOTE — Addendum Note (Signed)
Addended by: Darylene Price A on: 07/13/2016 01:21 PM   Modules accepted: Orders

## 2016-08-10 ENCOUNTER — Telehealth: Payer: Self-pay

## 2016-08-10 NOTE — Telephone Encounter (Signed)
  Oncology Nurse Navigator Documentation Received referral for EUS from [facility]. EUS scheduled for 09/27/16 with Dr. Mont Dutton at [place]. Went over instructions with Diane Patton and a copy of instructions sent to home address along with my contact information for and further questions. Denies anticoagulants other than asa 81mg .  INSTRUCTIONS FOR ENDOSCOPIC ULTRASOUND -Your procedure has been scheduled for August 9th with Dr. Mont Dutton at [facility]. -The hospital may contact you to pre-register over the phone.  -To get your scheduled arrival time, please call the Endoscopy unit at  [phone] between 1-3 p.m. on:  August 8th    -ON THE DAY OF YOU PROCEDURE:   1. If you are scheduled for a morning procedure, nothing to drink after midnight  -If you are scheduled for an afternoon procedure, you may have clear liquids until 5 hours prior  to the procedure but no carbonated drinks or broth  2. NO FOOD THE DAY OF YOUR PROCEDURE  3. You may take your heart, seizure, blood pressure, Parkinson's or breathing medications at  6am with just enough water to get your pills down  4. Do not take any oral Diabetic medications the morning of your procedure.  5. If you are a diabetic and are using insulin, please notify your prescribing physician of this  procedure as your dose may need to be altered related to not being able to eat or drink.   5. Do not take vitamins, iron, or fish oil for 5 days before your procedure     -On the day of your procedure, come to the [facility] Admitting/Registration desk (First desk on the right) at the scheduled arrival time. You MUST have someone drive you home from your procedure. You must have a responsible adult with a valid driver's license who is on site throughout your entire procedure and who can stay with you for several hours after your procedure. You may not go home alone in a taxi, shuttle [place] or bus, as the drivers will not be responsible  for you.  --If you have any questions please call me at the above contact   Navigator Location: [facility] Onc (08/10/16 0900)   )Navigator Encounter Type: Telephone (08/10/16 0900) Telephone: Diane Patton Call (08/10/16 0900)                       Barriers/Navigation Needs: Coordination of Care (08/10/16 0900)   Interventions: Coordination of Care (08/10/16 0900)   Coordination of Care: EUS (08/10/16 0900)                  Time Spent with Patient: 30 (08/10/16 0900)

## 2016-08-19 ENCOUNTER — Other Ambulatory Visit: Payer: Self-pay | Admitting: Family

## 2016-09-19 ENCOUNTER — Other Ambulatory Visit: Payer: Self-pay | Admitting: Family

## 2016-09-20 ENCOUNTER — Emergency Department
Admission: EM | Admit: 2016-09-20 | Discharge: 2016-09-20 | Disposition: A | Discharge status: Home / Self Care | Payer: Medicare Other | Attending: Emergency Medicine | Admitting: Emergency Medicine

## 2016-09-20 ENCOUNTER — Other Ambulatory Visit: Payer: Self-pay

## 2016-09-20 ENCOUNTER — Encounter: Payer: Self-pay | Admitting: *Deleted

## 2016-09-20 ENCOUNTER — Emergency Department: Payer: Medicare Other

## 2016-09-20 DIAGNOSIS — Z7982 Long term (current) use of aspirin: Secondary | ICD-10-CM | POA: Insufficient documentation

## 2016-09-20 DIAGNOSIS — E119 Type 2 diabetes mellitus without complications: Secondary | ICD-10-CM | POA: Insufficient documentation

## 2016-09-20 DIAGNOSIS — Z79899 Other long term (current) drug therapy: Secondary | ICD-10-CM | POA: Diagnosis not present

## 2016-09-20 DIAGNOSIS — I5042 Chronic combined systolic (congestive) and diastolic (congestive) heart failure: Secondary | ICD-10-CM | POA: Diagnosis not present

## 2016-09-20 DIAGNOSIS — J441 Chronic obstructive pulmonary disease with (acute) exacerbation: Secondary | ICD-10-CM

## 2016-09-20 DIAGNOSIS — J40 Bronchitis, not specified as acute or chronic: Secondary | ICD-10-CM

## 2016-09-20 DIAGNOSIS — Z87891 Personal history of nicotine dependence: Secondary | ICD-10-CM | POA: Insufficient documentation

## 2016-09-20 DIAGNOSIS — J209 Acute bronchitis, unspecified: Secondary | ICD-10-CM | POA: Insufficient documentation

## 2016-09-20 DIAGNOSIS — R0602 Shortness of breath: Secondary | ICD-10-CM | POA: Diagnosis present

## 2016-09-20 LAB — CBC WITH DIFFERENTIAL/PLATELET
BASOS PCT: 0 %
Basophils Absolute: 0 10*3/uL (ref 0–0.1)
EOS ABS: 0.1 10*3/uL (ref 0–0.7)
EOS PCT: 2 %
HCT: 35.4 % (ref 35.0–47.0)
HEMOGLOBIN: 11.6 g/dL — AB (ref 12.0–16.0)
Lymphocytes Relative: 23 %
Lymphs Abs: 1.6 10*3/uL (ref 1.0–3.6)
MCH: 29.6 pg (ref 26.0–34.0)
MCHC: 32.9 g/dL (ref 32.0–36.0)
MCV: 90.2 fL (ref 80.0–100.0)
Monocytes Absolute: 0.5 10*3/uL (ref 0.2–0.9)
Monocytes Relative: 7 %
NEUTROS PCT: 68 %
Neutro Abs: 4.9 10*3/uL (ref 1.4–6.5)
PLATELETS: 203 10*3/uL (ref 150–440)
RBC: 3.93 MIL/uL (ref 3.80–5.20)
RDW: 15.3 % — ABNORMAL HIGH (ref 11.5–14.5)
WBC: 7.2 10*3/uL (ref 3.6–11.0)

## 2016-09-20 LAB — BASIC METABOLIC PANEL
ANION GAP: 7 (ref 5–15)
BUN: 15 mg/dL (ref 6–20)
CO2: 29 mmol/L (ref 22–32)
Calcium: 10.9 mg/dL — ABNORMAL HIGH (ref 8.9–10.3)
Chloride: 105 mmol/L (ref 101–111)
Creatinine, Ser: 1.09 mg/dL — ABNORMAL HIGH (ref 0.44–1.00)
GFR calc Af Amer: 57 mL/min — ABNORMAL LOW (ref >60)
GFR, EST NON AFRICAN AMERICAN: 49 mL/min — AB (ref >60)
GLUCOSE: 159 mg/dL — AB (ref 65–99)
Potassium: 3.5 mmol/L (ref 3.5–5.1)
Sodium: 141 mmol/L (ref 135–145)

## 2016-09-20 LAB — TROPONIN I: Troponin I: <0.03 ng/mL (ref <0.03)

## 2016-09-20 LAB — BRAIN NATRIURETIC PEPTIDE: B Natriuretic Peptide: 60 pg/mL (ref 0.0–100.0)

## 2016-09-20 MED ORDER — AZITHROMYCIN 500 MG PO TABS
500.0000 mg | ORAL_TABLET | Freq: Once | ORAL | Status: AC
  Administered 2016-09-20: 500 mg via ORAL
  Filled 2016-09-20: qty 1

## 2016-09-20 MED ORDER — AZITHROMYCIN 250 MG PO TABS: ORAL_TABLET | ORAL | 0 refills | Status: DC

## 2016-09-20 MED ORDER — PREDNISONE 20 MG PO TABS: 60.0000 mg | ORAL_TABLET | Freq: Every day | ORAL | 0 refills | Status: DC

## 2016-09-20 MED ORDER — METHYLPREDNISOLONE SODIUM SUCC 125 MG IJ SOLR
125.0000 mg | Freq: Once | INTRAMUSCULAR | Status: AC
  Administered 2016-09-20: 125 mg via INTRAVENOUS
  Filled 2016-09-20: qty 2

## 2016-09-20 NOTE — Discharge Instructions (Signed)
Use your inhaler as needed, take the steroids until they're gone, return to the emergency room for shortness of breath, chest pain or any other new or worrisome symptoms. Follow closely with her doctor tomorrow.

## 2016-09-20 NOTE — ED Notes (Addendum)
Patient ambulated with a steady gait to and from bathroom. Patient was then taken to imaging.

## 2016-09-20 NOTE — ED Triage Notes (Signed)
Pt called out to EMS for eval of SOB. She has hx of COPD and CHF. Upon EMS arrival she was satting in the 94% of RA at home. EMS noted her to be wheezing in the upper lobes and she 2.5 mg of Albuterol and 0.5 mg of Atrovent and he sats improved to 100% on RA. She wears O2 intermittently as needed. She does reports a cough and runny nose x 10 days. She denies any CP, fevers, or chills.

## 2016-09-20 NOTE — ED Provider Notes (Addendum)
[facility] Emergency Department Provider Note  ____________________________________________   I have reviewed the triage vital signs and the nursing notes.   HISTORY  Chief Complaint Shortness of Breath    HPI Diane Patton is a 71 y.o. female with a history of CHF and COPD, on home oxygen when necessary, states she's had runny nose sore throat and cough for the last week. Today she began to wheeze. Called EMS. They found her with mild wheezing, gave her a 211 her wheezing cleared at this time she has no shortness of breath. No time showed any chest pain. She denies any nausea vomiting or leg swelling. She feels that this is a "bronchitis" possibly She has not had any exertional dyspnea. She does not have any hoarse voice or significant swelling.   Past Medical History:  Diagnosis Date  . CHF (congestive heart failure) ([place])   . COPD (chronic obstructive pulmonary disease) ([place])   . Diabetes mellitus, type 2 ([place])   . GERD (gastroesophageal reflux disease)   . HTN (hypertension)   . Hyperlipidemia   . Obstructive sleep apnea on CPAP   . Osteoarthritis   . Scoliosis     Patient Active Problem List   Diagnosis Date Noted  . Sleep apnea 01/19/2016  . Insomnia 03/21/2015  . Cough 11/01/2014  . Diabetes ([place]) 09/29/2014  . Chronic obstructive pulmonary disease ([place]) 09/29/2014  . Chronic systolic heart failure ([place]) 06/09/2014  . HYPERLIPIDEMIA-MIXED 12/16/2008  . Essential hypertension 12/16/2008    Past Surgical History:  Procedure Laterality Date  . ankle (other)    . APPENDECTOMY    . BREAST SURGERY    . COLONOSCOPY WITH PROPOFOL N/A 07/10/2016   Procedure: COLONOSCOPY WITH PROPOFOL;  Surgeon: Lollie Sails, MD;  Location: [facility] ENDOSCOPY;  Service: Endoscopy;  Laterality: N/A;  . ear (otheR)    . ESOPHAGOGASTRODUODENOSCOPY (EGD) WITH PROPOFOL N/A 07/10/2016   Procedure: ESOPHAGOGASTRODUODENOSCOPY (EGD) WITH PROPOFOL;  Surgeon: Lollie Sails, MD;  Location: [facility] ENDOSCOPY;  Service: Endoscopy;  Laterality: N/A;  . feet (other)    . sinus (other)    . stomach      Prior to Admission medications   Medication Sig Start Date End Date Taking? Authorizing Provider  albuterol (PROVENTIL HFA;VENTOLIN HFA) 108 (90 BASE) MCG/ACT inhaler Inhale 2 puffs into the lungs every 6 (six) hours as needed for wheezing or shortness of breath.    [provider]  aspirin 81 MG tablet Take 81 mg by mouth daily.    [provider]  Biotin 1000 MCG tablet Take 1,000 mcg by mouth daily.     [provider]  calcium carbonate (CALCIUM 600) 600 MG TABS tablet Take 600 mg by mouth daily with breakfast.    [provider]  cetirizine (ZYRTEC) 10 MG tablet Take 10 mg by mouth daily.    [provider]  cholecalciferol (VITAMIN D) 400 units TABS tablet Take 400 Units by mouth daily.    [provider]  [facility] Liver Oil w/Vit A & D CAPS Take 1 capsule by mouth daily.    [provider]  ENTRESTO 97-103 MG TAKE ONE TABLET BY MOUTH TWICE DAILY 09/20/16   Darylene Price A, FNP  ferrous gluconate (FERGON) 324 MG tablet Take 324 mg by mouth daily with breakfast.    [provider]  furosemide (LASIX) 40 MG tablet Take 40 mg by mouth daily.    [provider]  glimepiride (AMARYL) 2 MG tablet Take  2 mg by mouth daily with breakfast.    [provider]  isosorbide-hydrALAZINE (BIDIL) 20-37.5 MG tablet Take 1 tablet by mouth 3 (three) times daily. 04/18/16   Darylene Price A, FNP  magnesium oxide (MAG-OX) 400 MG tablet Take 800 mg by mouth 3 (three) times daily.     [provider]  meloxicam (MOBIC) 7.5 MG tablet Take 7.5 mg by mouth 2 (two) times daily as needed for pain.     [provider]  metFORMIN (GLUCOPHAGE) 500 MG tablet Take 1,000 mg by mouth 2 (two) times daily with a meal.    [provider]  metoprolol (TOPROL-XL) 200 MG 24 hr tablet TAKE  ONE TABLET BY MOUTH ONCE DAILY. TAKE WITH OR IMMEDIATELY FOLLOWING A MEAL. 05/21/16   Darylene Price A, FNP  montelukast (SINGULAIR) 10 MG tablet Take 10 mg by mouth at bedtime.    [provider]  Multiple Vitamin (MULTIVITAMIN) tablet Take 1 tablet by mouth daily.     [provider]  pantoprazole (PROTONIX) 40 MG tablet Take 40 mg by mouth 2 (two) times daily.    [provider]  potassium chloride (K-DUR) 10 MEQ tablet TAKE ONE TABLET BY MOUTH ONCE DAILY 08/19/16   Darylene Price A, FNP  rosuvastatin (CRESTOR) 20 MG tablet Take 20 mg by mouth daily.    [provider]  traMADol-acetaminophen (ULTRACET) 37.5-325 MG tablet Take 1 tablet by mouth every 6 (six) hours as needed.    [provider]  traZODone (DESYREL) 100 MG tablet Take 100 mg by mouth at bedtime.    [provider]  vitamin C (ASCORBIC ACID) 500 MG tablet Take 1,000 mg by mouth daily.     [provider]  vitamin E 400 UNIT capsule Take 400 Units by mouth daily.    [provider]    Allergies Amoxicillin; Aspirin; Celebrex [celecoxib]; Latex; Penicillins; and Allegra [fexofenadine]  Family History  Problem Relation Age of Onset  . Alzheimer's disease Other   . Heart attack Mother   . Hypertension Mother   . Cancer Brother        lung  . Cancer Maternal Aunt        mouth and breast  . Cancer Daughter        throat    Social History Social History  Substance Use Topics  . Smoking status: Former Smoker    Packs/day: 1.00    Years: 15.00    Types: Cigarettes    Quit date: 07/21/1982  . Smokeless tobacco: Never Used  . Alcohol use No     Comment: quit drinking 04/1981    Review of Systems Constitutional: No fever/chills Eyes: No visual changes. ENT: Positive sore throat. No stiff neck no neck pain Cardiovascular: Denies chest pain. Respiratory: Positive wheezing and shortness of breath. Gastrointestinal:   no vomiting.  No diarrhea.  No  constipation. Genitourinary: Negative for dysuria. Musculoskeletal: Negative lower extremity swelling Skin: Negative for rash. Neurological: Negative for severe headaches, focal weakness or numbness.   ____________________________________________   PHYSICAL EXAM:  VITAL SIGNS: ED Triage Vitals [09/20/16 2127]  Enc Vitals Group     BP      Pulse      Resp      Temp      Temp src      SpO2      Weight      Height      Head Circumference      Peak Flow  Pain Score 5     Pain Loc      Pain Edu?      Excl. in [place]?     Constitutional: Alert and oriented. Well appearing and in no acute distress. Eyes: Conjunctivae are normal Head: Atraumatic HEENT: Positive mild congestion/rhinnorhea. Mucous membranes are moist.  Oropharynx non-erythematous no trismus, no significant erythema, mild cobblestoning noted. Neck:   Nontender with no meningismus, no masses, no stridor Cardiovascular: Normal rate, regular rhythm. Grossly normal heart sounds.  Good peripheral circulation. Respiratory: Normal respiratory effort.  No retractions. Lungs CTAB. Abdominal: Soft and nontender. No distention. No guarding no rebound Back:  There is no focal tenderness or step off.  there is no midline tenderness there are no lesions noted. there is no CVA tenderness Musculoskeletal: No lower extremity tenderness, no upper extremity tenderness. No joint effusions, no DVT signs strong distal pulses no edema Neurologic:  Normal speech and language. No gross focal neurologic deficits are appreciated.  Skin:  Skin is warm, dry and intact. No rash noted. Psychiatric: Mood and affect are normal. Speech and behavior are normal.  ____________________________________________   LABS (all labs ordered are listed, but only abnormal results are displayed)  Labs Reviewed  BASIC METABOLIC PANEL  BRAIN NATRIURETIC PEPTIDE  TROPONIN I  CBC WITH DIFFERENTIAL/PLATELET    ____________________________________________  EKG  I personally interpreted any EKGs ordered by me or triage Sinus rhythm rate 70 bpm no acute ST elevation or depression, old left bundle branch block noted. ____________________________________________  [id number]  I reviewed any imaging ordered by me or triage that were performed during my shift and, if possible, patient and/or family made aware of any abnormal findings. ____________________________________________   PROCEDURES  Procedure(s) performed: None  Procedures  Critical Care performed: None  ____________________________________________   INITIAL IMPRESSION / ASSESSMENT AND PLAN / ED COURSE  Pertinent labs & imaging results that were available during my care of the patient were reviewed by me and considered in my medical decision making (see chart for details).  Patient here with a bronchitis-like symptoms with viral URI and cough. She has no wheeze at this time although she was wheezy for EMS. Given her history of CHF etc., also the troponin and is BNP negative chest x-ray although low suspicion for anything but viral pathology at this time. If those are negative and optic serial enzymes will be indicated as she has no chest pain and she resolved completely with albuterol.  ----------------------------------------- 10:46 PM on 09/20/2016 -----------------------------------------  She remains asymptomatic no shortness of breath, sats are 98% on room air which is very good for consideration is home oxygen when necessary, blood work is reassuring on evidence of troponin elevation or elevated BNP, chest x-ray is reassuring, she feels 100% better after the neb from EMS similar to go home. I will start her on prednisone and azithromycin given her cough and URI symptoms and history of COPD. Return progress and follow-up given and understood. Patient very comfortable with this plan.     ____________________________________________   FINAL CLINICAL IMPRESSION(S) / ED DIAGNOSES  Final diagnoses:  None      This chart was dictated using voice recognition software.  Despite best efforts to proofread,  errors can occur which can change meaning.      Schuyler Amor, MD 09/20/16 2134    Schuyler Amor, MD 09/20/16 3546    Schuyler Amor, MD 09/20/16 2249

## 2016-09-26 ENCOUNTER — Encounter: Payer: Self-pay | Admitting: *Deleted

## 2016-09-27 ENCOUNTER — Encounter: Payer: Self-pay | Admitting: Anesthesiology

## 2016-09-27 ENCOUNTER — Ambulatory Visit
Admission: RE | Admit: 2016-09-27 | Discharge: 2016-09-27 | Disposition: A | Discharge status: Home / Self Care | Payer: Medicare Other | Source: Ambulatory Visit | Attending: Internal Medicine | Admitting: Internal Medicine

## 2016-09-27 ENCOUNTER — Encounter
Admission: RE | Disposition: A | Discharge status: Home / Self Care | Payer: Self-pay | Source: Ambulatory Visit | Attending: Internal Medicine

## 2016-09-27 DIAGNOSIS — I110 Hypertensive heart disease with heart failure: Secondary | ICD-10-CM | POA: Diagnosis not present

## 2016-09-27 DIAGNOSIS — Z79899 Other long term (current) drug therapy: Secondary | ICD-10-CM | POA: Diagnosis not present

## 2016-09-27 DIAGNOSIS — K3189 Other diseases of stomach and duodenum: Secondary | ICD-10-CM | POA: Insufficient documentation

## 2016-09-27 DIAGNOSIS — G4733 Obstructive sleep apnea (adult) (pediatric): Secondary | ICD-10-CM | POA: Diagnosis not present

## 2016-09-27 DIAGNOSIS — K219 Gastro-esophageal reflux disease without esophagitis: Secondary | ICD-10-CM | POA: Diagnosis not present

## 2016-09-27 DIAGNOSIS — Z87891 Personal history of nicotine dependence: Secondary | ICD-10-CM | POA: Diagnosis not present

## 2016-09-27 DIAGNOSIS — Z539 Procedure and treatment not carried out, unspecified reason: Secondary | ICD-10-CM | POA: Diagnosis not present

## 2016-09-27 DIAGNOSIS — I509 Heart failure, unspecified: Secondary | ICD-10-CM | POA: Insufficient documentation

## 2016-09-27 DIAGNOSIS — M419 Scoliosis, unspecified: Secondary | ICD-10-CM | POA: Insufficient documentation

## 2016-09-27 DIAGNOSIS — Z7982 Long term (current) use of aspirin: Secondary | ICD-10-CM | POA: Insufficient documentation

## 2016-09-27 DIAGNOSIS — E119 Type 2 diabetes mellitus without complications: Secondary | ICD-10-CM | POA: Insufficient documentation

## 2016-09-27 DIAGNOSIS — Z7984 Long term (current) use of oral hypoglycemic drugs: Secondary | ICD-10-CM | POA: Diagnosis not present

## 2016-09-27 DIAGNOSIS — M1990 Unspecified osteoarthritis, unspecified site: Secondary | ICD-10-CM | POA: Diagnosis not present

## 2016-09-27 DIAGNOSIS — E785 Hyperlipidemia, unspecified: Secondary | ICD-10-CM | POA: Diagnosis not present

## 2016-09-27 DIAGNOSIS — Z880 Allergy status to penicillin: Secondary | ICD-10-CM | POA: Diagnosis not present

## 2016-09-27 DIAGNOSIS — J449 Chronic obstructive pulmonary disease, unspecified: Secondary | ICD-10-CM | POA: Diagnosis not present

## 2016-09-27 HISTORY — DX: Unspecified asthma, uncomplicated: J45.909

## 2016-09-27 LAB — GLUCOSE, CAPILLARY: Glucose-Capillary: 101 mg/dL — ABNORMAL HIGH (ref 65–99)

## 2016-09-27 SURGERY — UPPER ESOPHAGEAL ENDOSCOPIC ULTRASOUND (EUS)
Anesthesia: General

## 2016-09-27 MED ORDER — FENTANYL CITRATE (PF) 100 MCG/2ML IJ SOLN
INTRAMUSCULAR | Status: AC
  Filled 2016-09-27: qty 2

## 2016-09-27 MED ORDER — LIDOCAINE HCL (PF) 2 % IJ SOLN
INTRAMUSCULAR | Status: AC
  Filled 2016-09-27: qty 2

## 2016-09-27 MED ORDER — SODIUM CHLORIDE 0.9 % IV SOLN: INTRAVENOUS | Status: DC

## 2016-09-27 MED ORDER — MIDAZOLAM HCL 2 MG/2ML IJ SOLN
INTRAMUSCULAR | Status: AC
  Filled 2016-09-27: qty 2

## 2016-09-27 MED ORDER — GLYCOPYRROLATE 0.2 MG/ML IJ SOLN
INTRAMUSCULAR | Status: AC
  Filled 2016-09-27: qty 1

## 2016-09-27 MED ORDER — PROPOFOL 500 MG/50ML IV EMUL
INTRAVENOUS | Status: AC
Start: 2016-09-27 — End: ?
  Filled 2016-09-27: qty 50

## 2016-09-27 NOTE — H&P (Signed)
Diane Patton is an 71 y.o. female.   Chief Complaint: Gastric submucosal lesion HPI: Presents for EUS in setting of recent EGD revealing a gastric submucosal nodule.    Past Medical History:  Diagnosis Date  . CHF (congestive heart failure) ([place])   . COPD (chronic obstructive pulmonary disease) ([place])   . Diabetes mellitus, type 2 ([place])   . GERD (gastroesophageal reflux disease)   . HTN (hypertension)   . Hyperlipidemia   . Obstructive sleep apnea on CPAP   . Osteoarthritis   . Scoliosis     Past Surgical History:  Procedure Laterality Date  . ankle (other)    . APPENDECTOMY    . BREAST SURGERY    . COLONOSCOPY WITH PROPOFOL N/A 07/10/2016   Procedure: COLONOSCOPY WITH PROPOFOL;  Surgeon: Lollie Sails, MD;  Location: [facility] ENDOSCOPY;  Service: Endoscopy;  Laterality: N/A;  . ear (otheR)    . ESOPHAGOGASTRODUODENOSCOPY (EGD) WITH PROPOFOL N/A 07/10/2016   Procedure: ESOPHAGOGASTRODUODENOSCOPY (EGD) WITH PROPOFOL;  Surgeon: Lollie Sails, MD;  Location: [facility] ENDOSCOPY;  Service: Endoscopy;  Laterality: N/A;  . feet (other)    . sinus (other)    . stomach      Family History  Problem Relation Age of Onset  . Alzheimer's disease Other   . Heart attack Mother   . Hypertension Mother   . Cancer Brother        lung  . Cancer Maternal Aunt        mouth and breast  . Cancer Daughter        throat   Social History:  reports that she quit smoking about 34 years ago. Her smoking use included Cigarettes. She has a 15.00 pack-year smoking history. She has never used smokeless tobacco. She reports that she does not drink alcohol or use drugs.  Allergies:  Allergies  Allergen Reactions  . Amoxicillin Other (See Comments)    GI distress  . Aspirin Diarrhea and Nausea And Vomiting  . Celebrex [Celecoxib] Nausea Only  . Latex Itching and Swelling  . Penicillins Other (See Comments)    GI distress  . Allegra [Fexofenadine] Cough    Medications Prior to Admission   Medication Sig Dispense Refill  . albuterol (PROVENTIL HFA;VENTOLIN HFA) 108 (90 BASE) MCG/ACT inhaler Inhale 2 puffs into the lungs every 6 (six) hours as needed for wheezing or shortness of breath.    Marland Kitchen aspirin 81 MG tablet Take 81 mg by mouth daily.    Marland Kitchen azithromycin (ZITHROMAX Z-PAK) 250 MG tablet Take 2 tablets (500 mg) on  Day 1,  followed by 1 tablet (250 mg) once daily on Days 2 through 5. 6 each 0  . Biotin 1000 MCG tablet Take 1,000 mcg by mouth daily.     . calcium carbonate (CALCIUM 600) 600 MG TABS tablet Take 600 mg by mouth daily with breakfast.    . cetirizine (ZYRTEC) 10 MG tablet Take 10 mg by mouth daily.    . cholecalciferol (VITAMIN D) 400 units TABS tablet Take 400 Units by mouth daily.    Marland Kitchen Cod Liver Oil w/Vit A & D CAPS Take 1 capsule by mouth daily.    Marland Kitchen ENTRESTO 97-103 MG TAKE ONE TABLET BY MOUTH TWICE DAILY 180 tablet 3  . ferrous gluconate (FERGON) 324 MG tablet Take 324 mg by mouth daily with breakfast.    . furosemide (LASIX) 40 MG tablet Take 40 mg by mouth daily.    Marland Kitchen glimepiride (AMARYL) 2 MG tablet  Take 2 mg by mouth daily with breakfast.    . isosorbide-hydrALAZINE (BIDIL) 20-37.5 MG tablet Take 1 tablet by mouth 3 (three) times daily. 270 tablet 3  . magnesium oxide (MAG-OX) 400 MG tablet Take 800 mg by mouth 3 (three) times daily.     . meloxicam (MOBIC) 7.5 MG tablet Take 7.5 mg by mouth 2 (two) times daily as needed for pain.     . metFORMIN (GLUCOPHAGE) 500 MG tablet Take 1,000 mg by mouth 2 (two) times daily with a meal.    . metoprolol (TOPROL-XL) 200 MG 24 hr tablet TAKE ONE TABLET BY MOUTH ONCE DAILY. TAKE WITH OR IMMEDIATELY FOLLOWING A MEAL. 90 tablet 3  . montelukast (SINGULAIR) 10 MG tablet Take 10 mg by mouth at bedtime.    . Multiple Vitamin (MULTIVITAMIN) tablet Take 1 tablet by mouth daily.     . pantoprazole (PROTONIX) 40 MG tablet Take 40 mg by mouth 2 (two) times daily.    . potassium chloride (K-DUR) 10 MEQ tablet TAKE ONE TABLET BY MOUTH  ONCE DAILY 90 tablet 3  . predniSONE (DELTASONE) 20 MG tablet Take 3 tablets (60 mg total) by mouth daily. 12 tablet 0  . rosuvastatin (CRESTOR) 20 MG tablet Take 20 mg by mouth daily.    . traMADol-acetaminophen (ULTRACET) 37.5-325 MG tablet Take 1 tablet by mouth every 6 (six) hours as needed.    . traZODone (DESYREL) 100 MG tablet Take 100 mg by mouth at bedtime.    . vitamin C (ASCORBIC ACID) 500 MG tablet Take 1,000 mg by mouth daily.     . vitamin E 400 UNIT capsule Take 400 Units by mouth daily.      No results found for this or any previous visit (from the past 48 hour(s)). No results found.  ROS  Last menstrual period 06/20/1967. Physical Exam  Gen: NAD Resp: CTAB CV: RRR no murmurs Abd: Soft, NT, ND,+ BS  Assessment/Plan 1. Plan for EUS today to evaluate a gastric submucosal nodule.  Tillie Rung, MD 09/27/2016, 12:03 PM

## 2016-09-28 NOTE — Progress Notes (Signed)
  Oncology Nurse Navigator Documentation Message sent to [facility] NP and Hassie Bruce at [facility] to notify that EUS scheduled for 09/27/16 was cancelled by anesthesia due to COPD exacerbation. Navigator Location: [facility] Onc (09/28/16 1300)   )Navigator Encounter Type: Other (09/28/16 1300)                                                    Time Spent with Patient: 15 (09/28/16 1300)

## 2016-10-25 ENCOUNTER — Telehealth: Payer: Self-pay

## 2016-10-25 NOTE — Telephone Encounter (Signed)
  Oncology Nurse Navigator Documentation Received call from [place] at [facility]. They would like to have EUS rescheduled. EUS rescheduled for October 4th with Dr. Cephas Darby at [place]. Called and notified Diane Patton. Went over instructions and copy of instructions mailed to her home address with my contact information for any further questions.   INSTRUCTIONS FOR ENDOSCOPIC ULTRASOUND -Your procedure has been scheduled for October 4th with Dr. Cephas Darby at [facility]. -The hospital may contact you to pre-register over the phone.  -To get your scheduled arrival time, please call the Endoscopy unit at  [phone] between 1-3 p.m. on:  October 3rd   -ON THE DAY OF YOU PROCEDURE:   1. If you are scheduled for a morning procedure, nothing to drink after midnight  -If you are scheduled for an afternoon procedure, you may have clear liquids until 5 hours prior  to the procedure but no carbonated drinks or broth  2. NO FOOD THE DAY OF YOUR PROCEDURE  3. You may take your heart, seizure, blood pressure, Parkinson's or breathing medications at  6am with just enough water to get your pills down  4. Do not take any oral Diabetic medications the morning of your procedure.  5. If you are a diabetic and are using insulin, please notify your prescribing physician of this  procedure as your dose may need to be altered related to not being able to eat or drink.   5. Do not take vitamins, iron, or fish oil for 5 days before your procedure     -On the day of your procedure, come to the [facility] Admitting/Registration desk (First desk on the right) at the scheduled arrival time. You MUST have someone drive you home from your procedure. You must have a responsible adult with a valid driver's license who is on site throughout your entire procedure and who can stay with you for several hours after your procedure. You may not go home alone in a taxi, shuttle [place] or bus, as the drivers will not be  responsible for you.  --If you have any questions please call me at the above contact   Navigator Location: [facility] Onc (10/25/16 1500)   )Navigator Encounter Type: Telephone (10/25/16 1500) Telephone: Incoming Call;Outgoing Call (10/25/16 1500)                       Barriers/Navigation Needs: Coordination of Care (10/25/16 1500)   Interventions: Coordination of Care (10/25/16 1500)   Coordination of Care: EUS (10/25/16 1500)                  Time Spent with Patient: 30 (10/25/16 1500)

## 2016-11-22 ENCOUNTER — Ambulatory Visit
Admission: RE | Admit: 2016-11-22 | Discharge: 2016-11-22 | Disposition: A | Discharge status: Home / Self Care | Payer: Medicare Other | Source: Ambulatory Visit | Attending: Gastroenterology | Admitting: Gastroenterology

## 2016-11-22 ENCOUNTER — Encounter: Payer: Self-pay | Admitting: *Deleted

## 2016-11-22 ENCOUNTER — Ambulatory Visit: Payer: Medicare Other | Admitting: Anesthesiology

## 2016-11-22 ENCOUNTER — Encounter
Admission: RE | Disposition: A | Discharge status: Home / Self Care | Payer: Self-pay | Source: Ambulatory Visit | Attending: Gastroenterology

## 2016-11-22 DIAGNOSIS — I071 Rheumatic tricuspid insufficiency: Secondary | ICD-10-CM | POA: Diagnosis not present

## 2016-11-22 DIAGNOSIS — Z9989 Dependence on other enabling machines and devices: Secondary | ICD-10-CM | POA: Insufficient documentation

## 2016-11-22 DIAGNOSIS — M419 Scoliosis, unspecified: Secondary | ICD-10-CM | POA: Diagnosis not present

## 2016-11-22 DIAGNOSIS — Z7984 Long term (current) use of oral hypoglycemic drugs: Secondary | ICD-10-CM | POA: Diagnosis not present

## 2016-11-22 DIAGNOSIS — I272 Pulmonary hypertension, unspecified: Secondary | ICD-10-CM | POA: Diagnosis not present

## 2016-11-22 DIAGNOSIS — M1990 Unspecified osteoarthritis, unspecified site: Secondary | ICD-10-CM | POA: Insufficient documentation

## 2016-11-22 DIAGNOSIS — G4733 Obstructive sleep apnea (adult) (pediatric): Secondary | ICD-10-CM | POA: Diagnosis not present

## 2016-11-22 DIAGNOSIS — I509 Heart failure, unspecified: Secondary | ICD-10-CM | POA: Insufficient documentation

## 2016-11-22 DIAGNOSIS — E785 Hyperlipidemia, unspecified: Secondary | ICD-10-CM | POA: Insufficient documentation

## 2016-11-22 DIAGNOSIS — I110 Hypertensive heart disease with heart failure: Secondary | ICD-10-CM | POA: Insufficient documentation

## 2016-11-22 DIAGNOSIS — E119 Type 2 diabetes mellitus without complications: Secondary | ICD-10-CM | POA: Insufficient documentation

## 2016-11-22 DIAGNOSIS — Z87891 Personal history of nicotine dependence: Secondary | ICD-10-CM | POA: Diagnosis not present

## 2016-11-22 DIAGNOSIS — K219 Gastro-esophageal reflux disease without esophagitis: Secondary | ICD-10-CM | POA: Diagnosis not present

## 2016-11-22 DIAGNOSIS — K2950 Unspecified chronic gastritis without bleeding: Secondary | ICD-10-CM | POA: Insufficient documentation

## 2016-11-22 DIAGNOSIS — J449 Chronic obstructive pulmonary disease, unspecified: Secondary | ICD-10-CM | POA: Diagnosis not present

## 2016-11-22 DIAGNOSIS — K3189 Other diseases of stomach and duodenum: Secondary | ICD-10-CM | POA: Diagnosis not present

## 2016-11-22 HISTORY — PX: UPPER ESOPHAGEAL ENDOSCOPIC ULTRASOUND (EUS): SHX6562

## 2016-11-22 SURGERY — UPPER ESOPHAGEAL ENDOSCOPIC ULTRASOUND (EUS)
Anesthesia: General

## 2016-11-22 MED ORDER — LIDOCAINE HCL (CARDIAC) 20 MG/ML IV SOLN
INTRAVENOUS | Status: DC | PRN
  Administered 2016-11-22: 30 mg via INTRAVENOUS

## 2016-11-22 MED ORDER — IPRATROPIUM-ALBUTEROL 0.5-2.5 (3) MG/3ML IN SOLN: 3.0000 mL | Freq: Four times a day (QID) | RESPIRATORY_TRACT | Status: DC

## 2016-11-22 MED ORDER — IPRATROPIUM-ALBUTEROL 0.5-2.5 (3) MG/3ML IN SOLN
RESPIRATORY_TRACT | Status: AC
  Filled 2016-11-22: qty 3

## 2016-11-22 MED ORDER — GLYCOPYRROLATE 0.2 MG/ML IJ SOLN
INTRAMUSCULAR | Status: AC
  Filled 2016-11-22: qty 1

## 2016-11-22 MED ORDER — MIDAZOLAM HCL 2 MG/2ML IJ SOLN: INTRAMUSCULAR | Status: DC | PRN

## 2016-11-22 MED ORDER — LIDOCAINE HCL (PF) 2 % IJ SOLN
INTRAMUSCULAR | Status: AC
  Filled 2016-11-22: qty 10

## 2016-11-22 MED ORDER — PROPOFOL 500 MG/50ML IV EMUL
INTRAVENOUS | Status: AC
  Filled 2016-11-22: qty 50

## 2016-11-22 MED ORDER — SODIUM CHLORIDE 0.9 % IV SOLN
INTRAVENOUS | Status: DC
  Administered 2016-11-22: 14:00:00 via INTRAVENOUS

## 2016-11-22 MED ORDER — FENTANYL CITRATE (PF) 100 MCG/2ML IJ SOLN
INTRAMUSCULAR | Status: AC
Start: 2016-11-22 — End: 2016-11-22
  Filled 2016-11-22: qty 2

## 2016-11-22 MED ORDER — GLYCOPYRROLATE 0.2 MG/ML IJ SOLN
INTRAMUSCULAR | Status: DC | PRN
  Administered 2016-11-22: 0.1 mg via INTRAVENOUS

## 2016-11-22 MED ORDER — MIDAZOLAM HCL 2 MG/2ML IJ SOLN
INTRAMUSCULAR | Status: DC | PRN
  Administered 2016-11-22: 2 mg via INTRAVENOUS

## 2016-11-22 MED ORDER — MIDAZOLAM HCL 2 MG/2ML IJ SOLN
INTRAMUSCULAR | Status: AC
  Filled 2016-11-22: qty 2

## 2016-11-22 MED ORDER — PROPOFOL 500 MG/50ML IV EMUL
INTRAVENOUS | Status: DC | PRN
  Administered 2016-11-22: 120 ug/kg/min via INTRAVENOUS

## 2016-11-22 MED ORDER — ALBUTEROL SULFATE HFA 108 (90 BASE) MCG/ACT IN AERS
INHALATION_SPRAY | RESPIRATORY_TRACT | Status: DC | PRN
  Administered 2016-11-22: 2 via RESPIRATORY_TRACT

## 2016-11-22 MED ORDER — FENTANYL CITRATE (PF) 100 MCG/2ML IJ SOLN
INTRAMUSCULAR | Status: DC | PRN
  Administered 2016-11-22: 25 ug via INTRAVENOUS
  Administered 2016-11-22: 50 ug via INTRAVENOUS
  Administered 2016-11-22: 25 ug via INTRAVENOUS

## 2016-11-22 NOTE — Op Note (Signed)
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 Providers:            Lenetta Quaker. Cephas Darby, MD Referring MD:         Glendon Axe (Referring MD), Manya Silvas, MD                        (Referring MD) Medicines:            Monitored Anesthesia Care Complications:        No immediate complications. Procedure:            Pre-Anesthesia Assessment:                       - Prior to the procedure, a History and Physical was                        performed, and patient medications and allergies were                        reviewed. The patient is competent. The risks and                        benefits of the procedure and the sedation options and                        risks were discussed with the patient. All questions                        were answered and informed consent was obtained.                        Patient identification and proposed procedure were                        verified by the physician in the endoscopy suite.                        Mental Status Examination: alert and oriented. Airway                        Examination: normal oropharyngeal airway and neck                        mobility. Respiratory Examination: expiratory wheezes.                        CV Examination: normal. ASA Grade Assessment: II - A  patient with mild systemic disease. After reviewing the                        risks and benefits, the patient was deemed in                         satisfactory condition to undergo the procedure. The                        anesthesia plan was to use monitored anesthesia care                        (MAC). Immediately prior to administration of                        medications, the patient was re-assessed for adequacy                        to receive sedatives. The heart rate, respiratory rate,                        oxygen saturations, blood pressure, adequacy of                        pulmonary ventilation, and response to care were                        monitored throughout the procedure. The physical status                        of the patient was re-assessed after the procedure.                       After obtaining informed consent, the endoscope was                        passed under direct vision. Throughout the procedure,                        the patient's blood pressure, pulse, and oxygen                        saturations were monitored continuously. The EUS GI                        Radial Array [id number] was introduced through the mouth,                        and advanced to the esophagus and stomach. The upper                        endoscope was advance to the duodenum. Findings:      Endoscopic Finding :      The examined esophagus was endoscopically normal.      A single 10 mm submucosal papule (nodule) with no bleeding and no       stigmata of recent bleeding was found in the gastric antrum.      The examined duodenum was endoscopically normal.      Endosonographic Finding :      An  oval intramural (subepithelial) lesion was found in the antrum of the       stomach. The lesion was hyperechoic. Sonographically, the lesion       appeared to originate from the submucosa (Layer 3). The lesion measured       7.4 mm (in maximum thickness). The lesion also measured 12.1 mm in       diameter. The outer endosonographic borders were well defined. Tunneled       biopsies were taken with a cold forceps for histology.       There was no sign of significant endosonographic abnormality in the left       lobe of the liver. No masses were identified.      No lymph nodes were visualized in the celiac region (level 20) and       perigastric region. Impression:           EGD Impression:                       - Normal esophagus.                       - A single submucosal papule (nodule) found in the                        stomach.                       - Normal examined duodenum.                       EUS Impression:                       - An intramural (subepithelial) lesion was found in the                        antrum of the stomach. The lesion appeared to originate                        from within the submucosa (Layer 3). Tissue was                        obtained from this exam, and results are pending.                        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 Biopsied.                       - There was no evidence of significant pathology in the                        left lobe of the liver.                       - No lymph nodes were visualized in the celiac region                        (level 20) and perigastric region. Recommendation:       - Discharge patient  to home.                       - Await path results.                       - If pathology confirms lipomatous tissue no further                        EUS surveillance is necessary.                       - Return to referring physician as previously scheduled.                       - The findings and recommendations were discussed with                        the patient and their family. Dr. Lenetta Quaker. Cephas Darby, MD Lenetta Quaker. Diane Herrin, MD 11/22/2016 2:59:11 PM This report has been signed electronically. Number of Addenda: 0 Note Initiated On: 11/22/2016 1:46 PM Estimated Blood Loss: Estimated blood loss was minimal.      [facility]

## 2016-11-22 NOTE — Transfer of Care (Incomplete)
Immediate Anesthesia Transfer of Care Note  Patient: Diane Patton  Procedure(s) Performed: UPPER ESOPHAGEAL ENDOSCOPIC ULTRASOUND (EUS) (N/A )  Patient Location: {PLACES; ANE POST:19477::"PACU"}  Anesthesia Type:{PROCEDURES; ANE POST ANESTHESIA TYPE:19480}  Level of Consciousness: {FINDINGS; ANE POST LEVEL OF CONSCIOUSNESS:19484}  Airway & Oxygen Therapy: {Exam; oxygen device:30095}  Post-op Assessment: {ASSESSMENT;POST-OP GLOVFI:43329}  Post vital signs: {DESC; ANE POST JJOACZ:66063}  Last Vitals:  Vitals:   11/22/16 1312  BP: (!) 143/64  Pulse: 72  Resp: 18  Temp: (!) 36.4 C  SpO2: 94%    Last Pain:  Vitals:   11/22/16 1312  TempSrc: Tympanic         Complications: {FINDINGS; ANE POST COMPLICATIONS:19485}

## 2016-11-22 NOTE — Anesthesia Post-op Follow-up Note (Signed)
Anesthesia QCDR form completed.        

## 2016-11-22 NOTE — H&P (Signed)
PRE-PROCEDURE HISTORY AND PHYSICAL   Diane Patton presents for her scheduled Procedure(s): UPPER ESOPHAGEAL ENDOSCOPIC ULTRASOUND (EUS).  The indication for the procedure(s) is GASTRIC NODULE.  There have been no significant recent changes in the patient's medical status.  Past Medical History:  Diagnosis Date  . Asthma   . CHF (congestive heart failure) ([place])   . COPD (chronic obstructive pulmonary disease) ([place])   . Diabetes mellitus, type 2 ([place])   . GERD (gastroesophageal reflux disease)   . HTN (hypertension)   . Hyperlipidemia   . Obstructive sleep apnea on CPAP   . Osteoarthritis   . Scoliosis     Past Surgical History:  Procedure Laterality Date  . ABDOMINAL HYSTERECTOMY    . ankle (other)    . APPENDECTOMY    . BREAST SURGERY    . COLONOSCOPY WITH PROPOFOL N/A 07/10/2016   Procedure: COLONOSCOPY WITH PROPOFOL;  Surgeon: Lollie Sails, MD;  Location: [facility] ENDOSCOPY;  Service: Endoscopy;  Laterality: N/A;  . ear (otheR)    . ESOPHAGOGASTRODUODENOSCOPY (EGD) WITH PROPOFOL N/A 07/10/2016   Procedure: ESOPHAGOGASTRODUODENOSCOPY (EGD) WITH PROPOFOL;  Surgeon: Lollie Sails, MD;  Location: [facility] ENDOSCOPY;  Service: Endoscopy;  Laterality: N/A;  . feet (other)    . sinus (other)    . stomach      Allergies Allergies  Allergen Reactions  . Amoxicillin Other (See Comments)    GI distress  . Aspirin Diarrhea and Nausea And Vomiting  . Celebrex [Celecoxib] Nausea Only  . Latex Itching and Swelling  . Penicillins Other (See Comments)    GI distress  . Allegra [Fexofenadine] Cough    Medications Biotin, Cod Liver Oil w/Vit A & D, albuterol, aspirin, azithromycin, calcium carbonate, cetirizine, cholecalciferol, ferrous gluconate, furosemide, glimepiride, isosorbide-hydrALAZINE, magnesium oxide, meloxicam, metFORMIN, metoprolol, montelukast, multivitamin, pantoprazole, potassium chloride, predniSONE, rosuvastatin, sacubitril-valsartan, traMADol-acetaminophen,  traZODone, vitamin C, and vitamin E  Physical Examination  Body mass index is 31 kg/m. BP (!) 143/64   Pulse 72   Temp (!) 97.5 F (36.4 C) (Tympanic)   Resp 18   Ht 5\' 3"  (1.6 m)   Wt 79.4 kg (175 lb)   LMP 06/20/1967 (Approximate)   SpO2 94%   BMI 31.00 kg/m  General:   Alert,  pleasant and cooperative in NAD Head:  Normocephalic and atraumatic. Neck:  Supple; no masses or thyromegaly. Lungs:  Clear throughout to auscultation.    Heart:  Regular rate and rhythm. Abdomen:  Soft, nontender and nondistended. Normal bowel sounds, without guarding, and without rebound.   Neurologic:  Alert and  oriented x4;  grossly normal neurologically.  ASSESSMENT AND PLAN  Diane Patton has been evaluated and deemed appropriate to undergo the planned Procedure(s): UPPER ESOPHAGEAL ENDOSCOPIC ULTRASOUND (EUS).

## 2016-11-22 NOTE — Transfer of Care (Signed)
Immediate Anesthesia Transfer of Care Note  Patient: Diane Patton  Procedure(s) Performed: UPPER ESOPHAGEAL ENDOSCOPIC ULTRASOUND (EUS) (N/A )  Patient Location: Endoscopy Unit  Anesthesia Type:General  Level of Consciousness: awake and alert   Airway & Oxygen Therapy: Patient Spontanous Breathing and Patient connected to nasal cannula oxygen  Post-op Assessment: Report given to RN and Post -op Vital signs reviewed and stable  Post vital signs: Reviewed and stable  Last Vitals:  Vitals:   11/22/16 1451 11/22/16 1456  BP: (!) 86/50 (!) 130/53  Pulse: 74   Resp: 19   Temp: 36.6 C   SpO2: 96%     Last Pain:  Vitals:   11/22/16 1451  TempSrc: Tympanic         Complications: No apparent anesthesia complications

## 2016-11-22 NOTE — Anesthesia Procedure Notes (Signed)
Performed by: Vaughan Sine Pre-anesthesia Checklist: Patient identified, Emergency Drugs available, Suction available, Patient being monitored and Timeout performed Patient Re-evaluated:Patient Re-evaluated prior to induction Oxygen Delivery Method: Nasal cannula Preoxygenation: Pre-oxygenation with 100% oxygen Induction Type: IV induction Airway Equipment and Method: Bite block Placement Confirmation: CO2 detector

## 2016-11-22 NOTE — Anesthesia Preprocedure Evaluation (Signed)
Anesthesia Evaluation  Patient identified by MRN, date of birth, ID band Patient awake    Reviewed: Allergy & Precautions, NPO status , Patient's Chart, lab work & pertinent test results  History of Anesthesia Complications Negative for: history of anesthetic complications  Airway Mallampati: III  TM Distance: >3 FB Neck ROM: Full    Dental  (+) Upper Dentures   Pulmonary asthma , sleep apnea , COPD, former smoker,    breath sounds clear to auscultation- rhonchi (-) wheezing      Cardiovascular hypertension, Pt. on medications +CHF  (-) CAD, (-) Past MI and (-) Cardiac Stents  Rhythm:Regular Rate:Normal - Systolic murmurs and - Diastolic murmurs Echo 8/88/28: MILD LV SYSTOLIC DYSFUNCTION  WITH MODERATE LVH NORMAL RIGHT VENTRICULAR SYSTOLIC FUNCTION MILD VALVULAR REGURGITATION  NO VALVULAR STENOSIS MILD to MODERATE TR MILD to MODERATE PHTN EF 40%   Neuro/Psych negative neurological ROS  negative psych ROS   GI/Hepatic Neg liver ROS, GERD  ,  Endo/Other  diabetes, Oral Hypoglycemic Agents  Renal/GU negative Renal ROS     Musculoskeletal  (+) Arthritis ,   Abdominal (+) + obese,   Peds  Hematology negative hematology ROS (+)   Anesthesia Other Findings Past Medical History: No date: Asthma No date: CHF (congestive heart failure) (HCC) No date: COPD (chronic obstructive pulmonary disease) (HCC) No date: Diabetes mellitus, type 2 (HCC) No date: GERD (gastroesophageal reflux disease) No date: HTN (hypertension) No date: Hyperlipidemia No date: Obstructive sleep apnea on CPAP No date: Osteoarthritis No date: Scoliosis   Reproductive/Obstetrics                             Anesthesia Physical Anesthesia Plan  ASA: III  Anesthesia Plan: General   Post-op Pain Management:    Induction: Intravenous  PONV Risk Score and Plan: 2 and Propofol infusion  Airway Management Planned:  Natural Airway  Additional Equipment:   Intra-op Plan:   Post-operative Plan:   Informed Consent: I have reviewed the patients History and Physical, chart, labs and discussed the procedure including the risks, benefits and alternatives for the proposed anesthesia with the patient or authorized representative who has indicated his/her understanding and acceptance.   Dental advisory given  Plan Discussed with: CRNA and Anesthesiologist  Anesthesia Plan Comments:         Anesthesia Quick Evaluation

## 2016-11-23 NOTE — Progress Notes (Signed)
  Oncology Nurse Navigator Documentation EUS report routed to Dr. Gustavo Lah Navigator Location: [facility] (11/23/16 1500)   )Navigator Encounter Type: Diagnostic Results (11/23/16 1500)                                                    Time Spent with Patient: 15 (11/23/16 1500)

## 2016-11-23 NOTE — Anesthesia Postprocedure Evaluation (Signed)
Anesthesia Post Note  Patient: Grecia Lynk  Procedure(s) Performed: UPPER ESOPHAGEAL ENDOSCOPIC ULTRASOUND (EUS) (N/A )  Patient location during evaluation: Endoscopy Anesthesia Type: General Level of consciousness: awake and alert and oriented Pain management: pain level controlled Vital Signs Assessment: post-procedure vital signs reviewed and stable Respiratory status: spontaneous breathing, nonlabored ventilation and respiratory function stable Cardiovascular status: blood pressure returned to baseline and stable Postop Assessment: no signs of nausea or vomiting Anesthetic complications: no     Last Vitals:  Vitals:   11/22/16 1526 11/22/16 1534  BP:    Pulse:    Resp:    Temp:    SpO2: 98% 98%    Last Pain:  Vitals:   11/22/16 1451  TempSrc: Tympanic                 Jerzy Roepke

## 2016-11-26 ENCOUNTER — Encounter: Payer: Self-pay | Admitting: Gastroenterology

## 2016-11-26 LAB — SURGICAL PATHOLOGY

## 2016-11-29 ENCOUNTER — Telehealth: Payer: Self-pay

## 2016-11-29 NOTE — Telephone Encounter (Signed)
  Oncology Nurse Navigator Documentation EUS pathology routed to Dr. Gustavo Lah and Christiane Navigator Location: [facility] (11/29/16 1400)   )Navigator Encounter Type: Diagnostic Results (11/29/16 1400)                                                    Time Spent with Patient: 15 (11/29/16 1400)

## 2017-01-07 ENCOUNTER — Ambulatory Visit: Payer: Medicare Other | Attending: Family | Admitting: Family

## 2017-01-07 ENCOUNTER — Encounter: Payer: Self-pay | Admitting: Family

## 2017-01-07 ENCOUNTER — Other Ambulatory Visit: Payer: Self-pay

## 2017-01-07 VITALS — BP 148/68 | HR 84 | Resp 18 | Ht 63.0 in | Wt 180.4 lb

## 2017-01-07 DIAGNOSIS — K219 Gastro-esophageal reflux disease without esophagitis: Secondary | ICD-10-CM | POA: Diagnosis not present

## 2017-01-07 DIAGNOSIS — Z9889 Other specified postprocedural states: Secondary | ICD-10-CM | POA: Diagnosis not present

## 2017-01-07 DIAGNOSIS — Z8249 Family history of ischemic heart disease and other diseases of the circulatory system: Secondary | ICD-10-CM | POA: Diagnosis not present

## 2017-01-07 DIAGNOSIS — Z7982 Long term (current) use of aspirin: Secondary | ICD-10-CM | POA: Insufficient documentation

## 2017-01-07 DIAGNOSIS — I110 Hypertensive heart disease with heart failure: Secondary | ICD-10-CM | POA: Insufficient documentation

## 2017-01-07 DIAGNOSIS — I5022 Chronic systolic (congestive) heart failure: Secondary | ICD-10-CM | POA: Diagnosis not present

## 2017-01-07 DIAGNOSIS — Z79899 Other long term (current) drug therapy: Secondary | ICD-10-CM | POA: Insufficient documentation

## 2017-01-07 DIAGNOSIS — E785 Hyperlipidemia, unspecified: Secondary | ICD-10-CM | POA: Insufficient documentation

## 2017-01-07 DIAGNOSIS — Z90710 Acquired absence of both cervix and uterus: Secondary | ICD-10-CM | POA: Insufficient documentation

## 2017-01-07 DIAGNOSIS — Z886 Allergy status to analgesic agent status: Secondary | ICD-10-CM | POA: Diagnosis not present

## 2017-01-07 DIAGNOSIS — I272 Pulmonary hypertension, unspecified: Secondary | ICD-10-CM | POA: Insufficient documentation

## 2017-01-07 DIAGNOSIS — Z888 Allergy status to other drugs, medicaments and biological substances status: Secondary | ICD-10-CM | POA: Diagnosis not present

## 2017-01-07 DIAGNOSIS — Z803 Family history of malignant neoplasm of breast: Secondary | ICD-10-CM | POA: Diagnosis not present

## 2017-01-07 DIAGNOSIS — E119 Type 2 diabetes mellitus without complications: Secondary | ICD-10-CM | POA: Insufficient documentation

## 2017-01-07 DIAGNOSIS — Z87891 Personal history of nicotine dependence: Secondary | ICD-10-CM | POA: Diagnosis not present

## 2017-01-07 DIAGNOSIS — J441 Chronic obstructive pulmonary disease with (acute) exacerbation: Secondary | ICD-10-CM | POA: Diagnosis not present

## 2017-01-07 DIAGNOSIS — Z820 Family history of epilepsy and other diseases of the nervous system: Secondary | ICD-10-CM | POA: Insufficient documentation

## 2017-01-07 DIAGNOSIS — Z91048 Other nonmedicinal substance allergy status: Secondary | ICD-10-CM | POA: Diagnosis not present

## 2017-01-07 DIAGNOSIS — Z880 Allergy status to penicillin: Secondary | ICD-10-CM | POA: Diagnosis not present

## 2017-01-07 DIAGNOSIS — Z801 Family history of malignant neoplasm of trachea, bronchus and lung: Secondary | ICD-10-CM | POA: Insufficient documentation

## 2017-01-07 DIAGNOSIS — Z7984 Long term (current) use of oral hypoglycemic drugs: Secondary | ICD-10-CM | POA: Insufficient documentation

## 2017-01-07 DIAGNOSIS — M1990 Unspecified osteoarthritis, unspecified site: Secondary | ICD-10-CM | POA: Diagnosis not present

## 2017-01-07 DIAGNOSIS — Z800 Family history of malignant neoplasm of digestive organs: Secondary | ICD-10-CM | POA: Insufficient documentation

## 2017-01-07 DIAGNOSIS — I10 Essential (primary) hypertension: Secondary | ICD-10-CM

## 2017-01-07 DIAGNOSIS — M419 Scoliosis, unspecified: Secondary | ICD-10-CM | POA: Insufficient documentation

## 2017-01-07 DIAGNOSIS — G4733 Obstructive sleep apnea (adult) (pediatric): Secondary | ICD-10-CM | POA: Insufficient documentation

## 2017-01-07 LAB — GLUCOSE, CAPILLARY: GLUCOSE-CAPILLARY: 164 mg/dL — AB (ref 65–99)

## 2017-01-07 NOTE — Progress Notes (Signed)
Patient ID: Diane Patton, female    DOB: Jul 16, 1945, 71 y.o.   MRN: [id number]  HPI  Diane Patton is a 71 y/o female with a history of osteoarthritis, obstructive sleep apnea, hyperlipidemia, HTN, GERD, DM, COPD, pulmonary HTN, remote tobacco use and chronic heart failure.  Last echo was done on 08/17/15 and showed an EF of 40% with mild MR, AR and TR. Mild/mod pulmonary HTN. This is a slight improvement from 12/06/13 when her EF was 35-40%. Had cardiac catheterization done 05/12/14.  Was in the ED on 09/20/16 for COPD exacerbation. Treated with nebulizers and discharged with antibiotics.   She presents today for a follow-up visit today with a chief complaint of shortness of breath upon moderate exertion. She describes this as chronic in nature having been present for several years. She has associated wheezing and chest pain (GI in nature) along with this. She denies any fatigue, cough, edema, palpitations, dizziness, difficulty sleeping and weight gain.   Past Medical History:  Diagnosis Date  . Asthma   . CHF (congestive heart failure) ([place])   . COPD (chronic obstructive pulmonary disease) ([place])   . Diabetes mellitus, type 2 ([place])   . GERD (gastroesophageal reflux disease)   . HTN (hypertension)   . Hyperlipidemia   . Obstructive sleep apnea on CPAP   . Osteoarthritis   . Scoliosis    Past Surgical History:  Procedure Laterality Date  . ABDOMINAL HYSTERECTOMY    . ankle (other)    . APPENDECTOMY    . BREAST SURGERY    . COLONOSCOPY WITH PROPOFOL N/A 07/10/2016   Performed by Lollie Sails, MD at [facility]  . ear (otheR)    . ESOPHAGOGASTRODUODENOSCOPY (EGD) WITH PROPOFOL N/A 07/10/2016   Performed by Lollie Sails, MD at [place]  . feet (other)    . sinus (other)    . stomach    . UPPER ESOPHAGEAL ENDOSCOPIC ULTRASOUND (EUS) N/A 11/22/2016   Performed by Reita Cliche, MD at [facility] ENDOSCOPY   Family History  Problem Relation Age of Onset  . Alzheimer's  disease Other   . Heart attack Mother   . Hypertension Mother   . Cancer Brother        lung  . Cancer Maternal Aunt        mouth and breast  . Cancer Daughter        throat   Social History   Tobacco Use  . Smoking status: Former Smoker    Packs/day: 1.00    Years: 15.00    Pack years: 15.00    Types: Cigarettes    Last attempt to quit: 07/21/1982    Years since quitting: 34.4  . Smokeless tobacco: Never Used  Substance Use Topics  . Alcohol use: No    Alcohol/week: 9.0 oz    Types: 15 Standard drinks or equivalent per week    Comment: quit drinking 04/1981   Allergies  Allergen Reactions  . Amoxicillin Other (See Comments)    GI distress  . Aspirin Diarrhea and Nausea And Vomiting  . Celebrex [Celecoxib] Nausea Only  . Latex Itching and Swelling  . Penicillins Other (See Comments)    GI distress  . Allegra [Fexofenadine] Cough   Prior to Admission medications   Medication Sig Start Date End Date Taking? Authorizing Provider  albuterol (PROVENTIL HFA;VENTOLIN HFA) 108 (90 BASE) MCG/ACT inhaler Inhale 2 puffs into the lungs every 6 (six) hours as needed for wheezing or shortness of breath.  Yes [provider]  aspirin 81 MG tablet Take 81 mg by mouth daily.   Yes [provider]  Biotin 1000 MCG tablet Take 1,000 mcg by mouth daily.    Yes [provider]  calcium carbonate (CALCIUM 600) 600 MG TABS tablet Take 600 mg by mouth daily with breakfast.   Yes [provider]  cholecalciferol (VITAMIN D) 400 units TABS tablet Take 400 Units by mouth daily.   Yes [provider]  [facility] Liver Oil w/Vit A & D CAPS Take 1 capsule by mouth daily.   Yes [provider]  ENTRESTO 97-103 MG TAKE ONE TABLET BY MOUTH TWICE DAILY 09/20/16  Yes Darylene Price A, FNP  ferrous gluconate (FERGON) 324 MG tablet Take 324 mg by mouth daily with breakfast.   Yes [provider]  furosemide (LASIX) 40 MG tablet Take 40 mg by mouth daily.    Yes [provider]  glimepiride (AMARYL) 2 MG tablet Take 2 mg by mouth daily with breakfast.   Yes [provider]  isosorbide-hydrALAZINE (BIDIL) 20-37.5 MG tablet Take 1 tablet by mouth 3 (three) times daily. 04/18/16  Yes Darylene Price A, FNP  magnesium oxide (MAG-OX) 400 MG tablet Take 800 mg by mouth 3 (three) times daily.    Yes [provider]  meloxicam (MOBIC) 7.5 MG tablet Take 7.5 mg by mouth 2 (two) times daily as needed for pain.    Yes [provider]  metFORMIN (GLUCOPHAGE) 500 MG tablet Take 1,000 mg by mouth 2 (two) times daily with a meal.   Yes [provider]  metoprolol (TOPROL-XL) 200 MG 24 hr tablet TAKE ONE TABLET BY MOUTH ONCE DAILY. TAKE WITH OR IMMEDIATELY FOLLOWING A MEAL. 05/21/16  Yes Hackney, Tina A, FNP  montelukast (SINGULAIR) 10 MG tablet Take 10 mg by mouth at bedtime.   Yes [provider]  Multiple Vitamin (MULTIVITAMIN) tablet Take 1 tablet by mouth daily.    Yes [provider]  ondansetron (ZOFRAN-ODT) 4 MG disintegrating tablet Take 4 mg every 8 (eight) hours as needed by mouth for nausea or vomiting.   Yes [provider]  pantoprazole (PROTONIX) 40 MG tablet Take 40 mg by mouth 2 (two) times daily.   Yes [provider]  potassium chloride (K-DUR) 10 MEQ tablet TAKE ONE TABLET BY MOUTH ONCE DAILY 08/19/16  Yes Darylene Price A, FNP  rosuvastatin (CRESTOR) 20 MG tablet Take 20 mg by mouth daily.   Yes [provider]  traMADol-acetaminophen (ULTRACET) 37.5-325 MG tablet Take 1 tablet by mouth every 6 (six) hours as needed.   Yes [provider]  traZODone (DESYREL) 100 MG tablet Take 100 mg by mouth at bedtime.   Yes [provider]  vitamin B-12 (CYANOCOBALAMIN) 500 MCG tablet Take 500 mcg daily by mouth.   Yes [provider]  vitamin C (ASCORBIC ACID) 500 MG tablet Take 1,000 mg by mouth daily.    Yes [provider]  vitamin E 400  UNIT capsule Take 400 Units by mouth daily.   Yes [provider]  cetirizine (ZYRTEC) 10 MG tablet Take 10 mg by mouth daily.    [provider]   Review of Systems  Constitutional: Positive for appetite change (Lost daughter on the 12th). Negative for fatigue.  HENT: Positive for congestion, hearing loss (infection; taking ear drops) and rhinorrhea. Negative for sore throat.   Eyes: Negative.   Respiratory: Positive for shortness of breath and wheezing. Negative  for cough and chest tightness.   Cardiovascular: Positive for chest pain. Negative for palpitations and leg swelling.  Gastrointestinal: Negative for abdominal distention and abdominal pain.  Endocrine: Negative.   Genitourinary: Negative.   Musculoskeletal: Negative for back pain and neck pain.  Skin: Negative.   Allergic/Immunologic: Negative.   Neurological: Negative for dizziness and light-headedness.  Hematological: Negative for adenopathy. Does not bruise/bleed easily.  Psychiatric/Behavioral: Negative for dysphoric mood, sleep disturbance (sleeping on 2 pillows) and suicidal ideas. The patient is not nervous/anxious.    Vitals:   01/07/17 0902  BP: (!) 148/68  Pulse: 84  Resp: 18  SpO2: 100%  Weight: 180 lb 6 oz (81.8 kg)  Height: 5\' 3"  (1.6 m)   Wt Readings from Last 3 Encounters:  01/07/17 180 lb 6 oz (81.8 kg)  11/22/16 175 lb (79.4 kg)  09/27/16 175 lb (79.4 kg)    Lab Results  Component Value Date   CREATININE 1.09 (H) 09/20/2016   CREATININE 0.77 02/04/2016   CREATININE 0.82 11/01/2014    Physical Exam  Constitutional: She is oriented to person, place, and time. She appears well-developed and well-nourished.  HENT:  Head: Normocephalic and atraumatic.  Neck: Normal range of motion. Neck supple. No JVD present.  Cardiovascular: Normal rate and regular rhythm.  Pulmonary/Chest: Effort normal. She has no wheezes. She has no rales.  Abdominal: Soft. She exhibits no distension. There  is no tenderness.  Musculoskeletal: She exhibits no edema or tenderness.  Neurological: She is alert and oriented to person, place, and time.  Skin: Skin is warm and dry.  Psychiatric: She has a normal mood and affect. Her behavior is normal. Thought content normal.  Nursing note and vitals reviewed.  Assessment & Plan:  1: Chronic heart failure with reduced ejection fraction- - NYHA class II - euvolemic - only using salt on baked potato; encouraged only 2000 mg per day of salt - weight stable. Reminded to call for an overnight weight gain of >2 pounds or a weekly weight gain of >5 pounds - drinking around 30 ounces a day. Counseled on drinking between 45-50 ounces in a day - on max entrestro and toprol. On bidil as well - saw her cardiologist ([place]) on 07/19/16 - reports not receiving her flu vaccine for this season yet - having indigestion type chest pain and is planning on making an appointment with her PCP to discuss this  2: HTN- - BP looks good today - saw her PCP Candiss Norse) on 09/26/16 - BMP from 09/20/16 reviewed and shows sodium 141, potassium 3.5 and GFR 57  3: Diabetes- - nonfasting glucose in clinic was 164. Patient ate french toast this morning - follows with PCP about her diabetes - A1c on 09/19/16 was 6.3%  Medication bottles were reviewed.  Return in 6 months or sooner for any questions/problems before then.

## 2017-01-07 NOTE — Patient Instructions (Signed)
Resume weighing daily and call for an overnight weight gain of > 2 pounds or a weekly weight gain of >5 pounds. 

## 2017-02-04 ENCOUNTER — Other Ambulatory Visit: Payer: Self-pay | Admitting: Gastroenterology

## 2017-02-04 DIAGNOSIS — D509 Iron deficiency anemia, unspecified: Secondary | ICD-10-CM

## 2017-02-06 ENCOUNTER — Ambulatory Visit: Payer: Medicare Other

## 2017-03-06 ENCOUNTER — Ambulatory Visit
Admission: RE | Admit: 2017-03-06 | Discharge: 2017-03-06 | Disposition: A | Discharge status: Home / Self Care | Payer: 59 | Source: Ambulatory Visit | Attending: Gastroenterology | Admitting: Gastroenterology

## 2017-03-06 DIAGNOSIS — D509 Iron deficiency anemia, unspecified: Secondary | ICD-10-CM

## 2017-05-20 ENCOUNTER — Other Ambulatory Visit: Payer: Self-pay | Admitting: Family

## 2017-07-07 NOTE — Progress Notes (Signed)
Patient ID: Diane Patton, female    DOB: [birth year], 72 y.o.   MRN: [id number]  HPI  Ms Douglass is a 72 y/o female with a history of osteoarthritis, obstructive sleep apnea, hyperlipidemia, HTN, GERD, DM, COPD, pulmonary HTN, remote tobacco use and chronic heart failure.  Last echo was done on 08/17/15 and showed an EF of 40% with mild MR, AR and TR. Mild/mod pulmonary HTN. This is a slight improvement from 12/06/13 when her EF was 35-40%. Had cardiac catheterization done 05/12/14.  Has not been admitted or been in the ED in the last 6 months.   She presents today for a follow-up visit today with a chief complaint of intermittent chest pain. She says that this has been chronic in nature having been present for several years. She has associated head congestion along with this. She denies any fatigue, shortness of breath, cough, wheezing, pedal edema, palpitations, abdominal distention, dizziness or weight gain.   Past Medical History:  Diagnosis Date  . Asthma   . CHF (congestive heart failure) ([place])   . COPD (chronic obstructive pulmonary disease) ([place])   . Diabetes mellitus, type 2 ([place])   . GERD (gastroesophageal reflux disease)   . HTN (hypertension)   . Hyperlipidemia   . Obstructive sleep apnea on CPAP   . Osteoarthritis   . Scoliosis    Past Surgical History:  Procedure Laterality Date  . ABDOMINAL HYSTERECTOMY    . ankle (other)    . APPENDECTOMY    . BREAST SURGERY    . COLONOSCOPY WITH PROPOFOL N/A 07/10/2016   Procedure: COLONOSCOPY WITH PROPOFOL;  Surgeon: Lollie Sails, MD;  Location: [facility] ENDOSCOPY;  Service: Endoscopy;  Laterality: N/A;  . ear (otheR)    . ESOPHAGOGASTRODUODENOSCOPY (EGD) WITH PROPOFOL N/A 07/10/2016   Procedure: ESOPHAGOGASTRODUODENOSCOPY (EGD) WITH PROPOFOL;  Surgeon: Lollie Sails, MD;  Location: [facility] ENDOSCOPY;  Service: Endoscopy;  Laterality: N/A;  . feet (other)    . sinus (other)    . stomach    . UPPER ESOPHAGEAL ENDOSCOPIC ULTRASOUND  (EUS) N/A 11/22/2016   Procedure: UPPER ESOPHAGEAL ENDOSCOPIC ULTRASOUND (EUS);  Surgeon: Reita Cliche, MD;  Location: [facility] ENDOSCOPY;  Service: Gastroenterology;  Laterality: N/A;   Family History  Problem Relation Age of Onset  . Alzheimer's disease Other   . Heart attack Mother   . Hypertension Mother   . Cancer Brother        lung  . Cancer Maternal Aunt        mouth and breast  . Cancer Daughter        throat   Social History   Tobacco Use  . Smoking status: Former Smoker    Packs/day: 1.00    Years: 15.00    Pack years: 15.00    Types: Cigarettes    Last attempt to quit: 07/21/1982    Years since quitting: 34.9  . Smokeless tobacco: Never Used  Substance Use Topics  . Alcohol use: No    Alcohol/week: 9.0 oz    Types: 15 Standard drinks or equivalent per week    Comment: quit drinking 04/1981   Allergies  Allergen Reactions  . Amoxicillin Other (See Comments)    GI distress  . Aspirin Diarrhea and Nausea And Vomiting  . Celebrex [Celecoxib] Nausea Only  . Latex Itching and Swelling  . Penicillins Other (See Comments)    GI distress  . Allegra [Fexofenadine] Cough   Prior to Admission medications   Medication Sig Start Date  End Date Taking? Authorizing Provider  albuterol (PROVENTIL HFA;VENTOLIN HFA) 108 (90 BASE) MCG/ACT inhaler Inhale 2 puffs into the lungs every 6 (six) hours as needed for wheezing or shortness of breath.   Yes [provider]  aspirin 81 MG tablet Take 81 mg by mouth daily.   Yes [provider]  BIDIL 20-37.5 MG tablet TAKE 1 TABLET BY MOUTH THREE TIMES DAILY 05/20/17  Yes Darylene Price A, FNP  Biotin 1000 MCG tablet Take 1,000 mcg by mouth daily.    Yes [provider]  calcium carbonate (CALCIUM 600) 600 MG TABS tablet Take 600 mg by mouth 2 (two) times daily with a meal.    Yes [provider]  cholecalciferol (VITAMIN D) 400 units TABS tablet Take 2,000 Units by mouth daily.    Yes [provider]  [facility] Liver Oil w/Vit A & D CAPS Take 1 capsule by mouth daily.   Yes [provider]  ENTRESTO 97-103 MG TAKE ONE TABLET BY MOUTH TWICE DAILY 09/20/16  Yes Darylene Price A, FNP  ferrous gluconate (FERGON) 324 MG tablet Take 324 mg by mouth daily with breakfast.   Yes [provider]  furosemide (LASIX) 40 MG tablet Take 40 mg by mouth daily.   Yes [provider]  glimepiride (AMARYL) 2 MG tablet Take 2 mg by mouth daily with breakfast.   Yes [provider]  levocetirizine (XYZAL) 5 MG tablet Take 5 mg by mouth every evening.   Yes [provider]  meloxicam (MOBIC) 7.5 MG tablet Take 7.5 mg by mouth 2 (two) times daily as needed for pain. Takes one every other day   Yes [provider]  metFORMIN (GLUCOPHAGE) 500 MG tablet Take 1,000 mg by mouth 2 (two) times daily with a meal.   Yes [provider]  metoprolol (TOPROL-XL) 200 MG 24 hr tablet TAKE 1 TABLET BY MOUTH ONCE DAILY. TAKE WITH OR IMMEDIATELY FOLLOWING A MEAL. 05/20/17  Yes Hackney, Tina A, FNP  montelukast (SINGULAIR) 10 MG tablet Take 10 mg by mouth at bedtime.   Yes [provider]  Multiple Vitamin (MULTIVITAMIN) tablet Take 1 tablet by mouth daily.    Yes [provider]  Omega 3-6-9 Fatty Acids (TRIPLE OMEGA-3-6-9) CAPS Take 1 capsule by mouth daily.   Yes [provider]  omeprazole (PRILOSEC) 20 MG capsule Take 20 mg by mouth 2 (two) times daily before a meal.   Yes [provider]  potassium chloride (K-DUR) 10 MEQ tablet TAKE ONE TABLET BY MOUTH ONCE DAILY 08/19/16  Yes Darylene Price A, FNP  rosuvastatin (CRESTOR) 20 MG tablet Take 20 mg by mouth daily.   Yes [provider]  Tiotropium Bromide Monohydrate (SPIRIVA RESPIMAT) 1.25 MCG/ACT AERS Inhale 2 puffs into the lungs daily.   Yes [provider]  traMADol-acetaminophen (ULTRACET) 37.5-325 MG tablet Take 1 tablet by mouth every 6 (six) hours as needed. Takes  twice a day   Yes [provider]  traZODone (DESYREL) 100 MG tablet Take 100 mg by mouth at bedtime.   Yes [provider]  vitamin B-12 (CYANOCOBALAMIN) 500 MCG tablet Take 500 mcg daily by mouth.   Yes [provider]  vitamin C (ASCORBIC ACID) 500 MG tablet Take 500 mg by mouth daily.    Yes [provider]  vitamin E 400 UNIT capsule Take 400 Units by mouth daily.   Yes [provider]  cetirizine (ZYRTEC) 10 MG tablet Take 10 mg  by mouth daily.    [provider]  ondansetron (ZOFRAN-ODT) 4 MG disintegrating tablet Take 4 mg every 8 (eight) hours as needed by mouth for nausea or vomiting.    [provider]  pantoprazole (PROTONIX) 40 MG tablet Take 40 mg by mouth 2 (two) times daily.    [provider]    Review of Systems  Constitutional: Negative for appetite change and fatigue.  HENT: Positive for congestion, hearing loss and rhinorrhea. Negative for sore throat.   Eyes: Negative.   Respiratory: Negative for cough, chest tightness, shortness of breath and wheezing.   Cardiovascular: Positive for chest pain. Negative for palpitations and leg swelling.  Gastrointestinal: Negative for abdominal distention and abdominal pain.  Endocrine: Negative.   Genitourinary: Negative.   Musculoskeletal: Negative for back pain and neck pain.  Skin: Negative.   Allergic/Immunologic: Negative.   Neurological: Negative for dizziness and light-headedness.  Hematological: Negative for adenopathy. Does not bruise/bleed easily.  Psychiatric/Behavioral: Negative for dysphoric mood, sleep disturbance (sleeping on 2 pillows) and suicidal ideas. The patient is not nervous/anxious.    Vitals:   07/08/17 0918  BP: (!) 138/59  Pulse: 93  Resp: 18  SpO2: 100%  Weight: 179 lb 8 oz (81.4 kg)  Height: 5\' 3"  (1.6 m)   Wt Readings from Last 3 Encounters:  07/08/17 179 lb 8 oz (81.4 kg)  01/07/17 180 lb 6 oz (81.8 kg)  11/22/16 175 lb  (79.4 kg)   Lab Results  Component Value Date   CREATININE 1.09 (H) 09/20/2016   CREATININE 0.77 02/04/2016   CREATININE 0.82 11/01/2014    Physical Exam  Constitutional: She is oriented to person, place, and time. She appears well-developed and well-nourished.  HENT:  Head: Normocephalic and atraumatic.  Neck: Normal range of motion. Neck supple. No JVD present.  Cardiovascular: Normal rate and regular rhythm.  Pulmonary/Chest: Effort normal. She has no wheezes. She has no rales.  Abdominal: Soft. She exhibits no distension. There is no tenderness.  Musculoskeletal: She exhibits no edema or tenderness.  Neurological: She is alert and oriented to person, place, and time.  Skin: Skin is warm and dry.  Psychiatric: She has a normal mood and affect. Her behavior is normal. Thought content normal.  Nursing note and vitals reviewed.  Assessment & Plan:  1: Chronic heart failure with reduced ejection fraction- - NYHA class I - euvolemic - only using salt on baked potato; encouraged only 2000 mg per day of salt - continues to weigh daily. Reminded to call for an overnight weight gain of >2 pounds or a weekly weight gain of >5 pounds - weight stable since the last time she was here - drinking around 30 ounces a day. Counseled on drinking between 45-50 ounces in a day - on max entrestro and toprol. On bidil as well - saw her cardiologist (Atkins) on 01/17/17 & returns 07/25/17 - PharmD reconciled medications with patient  2: HTN- - BP looks good today - saw her PCP Candiss Norse) on 01/22/17 - BMP from 01/15/17 reviewed and shows sodium 141, potassium 4.2 and GFR 56  3: Diabetes- - fasting glucose at home was 99 - follows with PCP about her diabetes - A1c on 09/19/16 was 6.3%  Medication bottles were reviewed.  Patient is to return as needed. She was instructed to call at any time in the future to make another appointment.

## 2017-07-08 ENCOUNTER — Ambulatory Visit: Payer: 59 | Attending: Family | Admitting: Family

## 2017-07-08 ENCOUNTER — Encounter: Payer: Self-pay | Admitting: Family

## 2017-07-08 VITALS — BP 138/59 | HR 93 | Resp 18 | Ht 63.0 in | Wt 179.5 lb

## 2017-07-08 DIAGNOSIS — R079 Chest pain, unspecified: Secondary | ICD-10-CM | POA: Diagnosis present

## 2017-07-08 DIAGNOSIS — J449 Chronic obstructive pulmonary disease, unspecified: Secondary | ICD-10-CM | POA: Diagnosis not present

## 2017-07-08 DIAGNOSIS — I110 Hypertensive heart disease with heart failure: Secondary | ICD-10-CM | POA: Insufficient documentation

## 2017-07-08 DIAGNOSIS — Z87891 Personal history of nicotine dependence: Secondary | ICD-10-CM | POA: Diagnosis not present

## 2017-07-08 DIAGNOSIS — G4733 Obstructive sleep apnea (adult) (pediatric): Secondary | ICD-10-CM | POA: Insufficient documentation

## 2017-07-08 DIAGNOSIS — M1990 Unspecified osteoarthritis, unspecified site: Secondary | ICD-10-CM | POA: Insufficient documentation

## 2017-07-08 DIAGNOSIS — M419 Scoliosis, unspecified: Secondary | ICD-10-CM | POA: Insufficient documentation

## 2017-07-08 DIAGNOSIS — K219 Gastro-esophageal reflux disease without esophagitis: Secondary | ICD-10-CM | POA: Diagnosis not present

## 2017-07-08 DIAGNOSIS — Z880 Allergy status to penicillin: Secondary | ICD-10-CM | POA: Diagnosis not present

## 2017-07-08 DIAGNOSIS — Z886 Allergy status to analgesic agent status: Secondary | ICD-10-CM | POA: Insufficient documentation

## 2017-07-08 DIAGNOSIS — Z7982 Long term (current) use of aspirin: Secondary | ICD-10-CM | POA: Diagnosis not present

## 2017-07-08 DIAGNOSIS — I272 Pulmonary hypertension, unspecified: Secondary | ICD-10-CM | POA: Diagnosis not present

## 2017-07-08 DIAGNOSIS — E785 Hyperlipidemia, unspecified: Secondary | ICD-10-CM | POA: Diagnosis not present

## 2017-07-08 DIAGNOSIS — Z7984 Long term (current) use of oral hypoglycemic drugs: Secondary | ICD-10-CM | POA: Insufficient documentation

## 2017-07-08 DIAGNOSIS — Z79899 Other long term (current) drug therapy: Secondary | ICD-10-CM | POA: Diagnosis not present

## 2017-07-08 DIAGNOSIS — Z791 Long term (current) use of non-steroidal anti-inflammatories (NSAID): Secondary | ICD-10-CM | POA: Diagnosis not present

## 2017-07-08 DIAGNOSIS — Z8249 Family history of ischemic heart disease and other diseases of the circulatory system: Secondary | ICD-10-CM | POA: Diagnosis not present

## 2017-07-08 DIAGNOSIS — I10 Essential (primary) hypertension: Secondary | ICD-10-CM

## 2017-07-08 DIAGNOSIS — E119 Type 2 diabetes mellitus without complications: Secondary | ICD-10-CM | POA: Diagnosis not present

## 2017-07-08 DIAGNOSIS — I5022 Chronic systolic (congestive) heart failure: Secondary | ICD-10-CM | POA: Insufficient documentation

## 2017-07-08 NOTE — Patient Instructions (Signed)
Continue weighing daily and call for an overnight weight gain of > 2 pounds or a weekly weight gain of >5 pounds. 

## 2017-07-31 ENCOUNTER — Other Ambulatory Visit: Payer: Self-pay | Admitting: Internal Medicine

## 2017-08-14 ENCOUNTER — Other Ambulatory Visit: Payer: Self-pay | Admitting: Internal Medicine

## 2017-08-14 DIAGNOSIS — Z1231 Encounter for screening mammogram for malignant neoplasm of breast: Secondary | ICD-10-CM

## 2017-09-03 ENCOUNTER — Ambulatory Visit
Admission: RE | Admit: 2017-09-03 | Discharge: 2017-09-03 | Disposition: A | Discharge status: Home / Self Care | Payer: 59 | Source: Ambulatory Visit | Attending: Internal Medicine | Admitting: Internal Medicine

## 2017-09-03 DIAGNOSIS — Z1231 Encounter for screening mammogram for malignant neoplasm of breast: Secondary | ICD-10-CM | POA: Insufficient documentation

## 2017-09-17 ENCOUNTER — Ambulatory Visit
Admission: RE | Admit: 2017-09-17 | Discharge: 2017-09-17 | Disposition: A | Discharge status: Home / Self Care | Payer: 59 | Source: Ambulatory Visit | Attending: Gastroenterology | Admitting: Gastroenterology

## 2017-09-17 ENCOUNTER — Encounter: Payer: Self-pay | Admitting: Gastroenterology

## 2017-09-17 ENCOUNTER — Encounter
Admission: RE | Disposition: A | Discharge status: Home / Self Care | Payer: Self-pay | Source: Ambulatory Visit | Attending: Gastroenterology

## 2017-09-17 ENCOUNTER — Ambulatory Visit: Payer: 59 | Admitting: Anesthesiology

## 2017-09-17 DIAGNOSIS — Z7951 Long term (current) use of inhaled steroids: Secondary | ICD-10-CM | POA: Insufficient documentation

## 2017-09-17 DIAGNOSIS — M419 Scoliosis, unspecified: Secondary | ICD-10-CM | POA: Diagnosis not present

## 2017-09-17 DIAGNOSIS — K259 Gastric ulcer, unspecified as acute or chronic, without hemorrhage or perforation: Secondary | ICD-10-CM | POA: Insufficient documentation

## 2017-09-17 DIAGNOSIS — Z888 Allergy status to other drugs, medicaments and biological substances status: Secondary | ICD-10-CM | POA: Diagnosis not present

## 2017-09-17 DIAGNOSIS — E119 Type 2 diabetes mellitus without complications: Secondary | ICD-10-CM | POA: Insufficient documentation

## 2017-09-17 DIAGNOSIS — I509 Heart failure, unspecified: Secondary | ICD-10-CM | POA: Diagnosis not present

## 2017-09-17 DIAGNOSIS — Z6831 Body mass index (BMI) 31.0-31.9, adult: Secondary | ICD-10-CM | POA: Insufficient documentation

## 2017-09-17 DIAGNOSIS — Z79899 Other long term (current) drug therapy: Secondary | ICD-10-CM | POA: Insufficient documentation

## 2017-09-17 DIAGNOSIS — Z7982 Long term (current) use of aspirin: Secondary | ICD-10-CM | POA: Diagnosis not present

## 2017-09-17 DIAGNOSIS — J449 Chronic obstructive pulmonary disease, unspecified: Secondary | ICD-10-CM | POA: Diagnosis not present

## 2017-09-17 DIAGNOSIS — M1990 Unspecified osteoarthritis, unspecified site: Secondary | ICD-10-CM | POA: Insufficient documentation

## 2017-09-17 DIAGNOSIS — E669 Obesity, unspecified: Secondary | ICD-10-CM | POA: Insufficient documentation

## 2017-09-17 DIAGNOSIS — G4733 Obstructive sleep apnea (adult) (pediatric): Secondary | ICD-10-CM | POA: Diagnosis not present

## 2017-09-17 DIAGNOSIS — Z886 Allergy status to analgesic agent status: Secondary | ICD-10-CM | POA: Diagnosis not present

## 2017-09-17 DIAGNOSIS — D509 Iron deficiency anemia, unspecified: Secondary | ICD-10-CM | POA: Diagnosis present

## 2017-09-17 DIAGNOSIS — Z7984 Long term (current) use of oral hypoglycemic drugs: Secondary | ICD-10-CM | POA: Diagnosis not present

## 2017-09-17 DIAGNOSIS — I272 Pulmonary hypertension, unspecified: Secondary | ICD-10-CM | POA: Insufficient documentation

## 2017-09-17 DIAGNOSIS — D175 Benign lipomatous neoplasm of intra-abdominal organs: Secondary | ICD-10-CM | POA: Insufficient documentation

## 2017-09-17 DIAGNOSIS — I110 Hypertensive heart disease with heart failure: Secondary | ICD-10-CM | POA: Diagnosis not present

## 2017-09-17 DIAGNOSIS — Z91040 Latex allergy status: Secondary | ICD-10-CM | POA: Diagnosis not present

## 2017-09-17 DIAGNOSIS — E785 Hyperlipidemia, unspecified: Secondary | ICD-10-CM | POA: Insufficient documentation

## 2017-09-17 DIAGNOSIS — Z87891 Personal history of nicotine dependence: Secondary | ICD-10-CM | POA: Insufficient documentation

## 2017-09-17 DIAGNOSIS — K2950 Unspecified chronic gastritis without bleeding: Secondary | ICD-10-CM | POA: Insufficient documentation

## 2017-09-17 DIAGNOSIS — K219 Gastro-esophageal reflux disease without esophagitis: Secondary | ICD-10-CM | POA: Insufficient documentation

## 2017-09-17 DIAGNOSIS — J45909 Unspecified asthma, uncomplicated: Secondary | ICD-10-CM | POA: Insufficient documentation

## 2017-09-17 DIAGNOSIS — Z881 Allergy status to other antibiotic agents status: Secondary | ICD-10-CM | POA: Diagnosis not present

## 2017-09-17 DIAGNOSIS — Z880 Allergy status to penicillin: Secondary | ICD-10-CM | POA: Insufficient documentation

## 2017-09-17 DIAGNOSIS — K449 Diaphragmatic hernia without obstruction or gangrene: Secondary | ICD-10-CM | POA: Diagnosis not present

## 2017-09-17 DIAGNOSIS — I071 Rheumatic tricuspid insufficiency: Secondary | ICD-10-CM | POA: Insufficient documentation

## 2017-09-17 HISTORY — PX: ESOPHAGOGASTRODUODENOSCOPY (EGD) WITH PROPOFOL: SHX5813

## 2017-09-17 LAB — GLUCOSE, CAPILLARY: GLUCOSE-CAPILLARY: 142 mg/dL — AB (ref 70–99)

## 2017-09-17 SURGERY — ESOPHAGOGASTRODUODENOSCOPY (EGD) WITH PROPOFOL
Anesthesia: General

## 2017-09-17 MED ORDER — PROPOFOL 500 MG/50ML IV EMUL
INTRAVENOUS | Status: AC
  Filled 2017-09-17: qty 50

## 2017-09-17 MED ORDER — LIDOCAINE HCL (PF) 1 % IJ SOLN: 2.0000 mL | Freq: Once | INTRAMUSCULAR | Status: DC

## 2017-09-17 MED ORDER — PROPOFOL 500 MG/50ML IV EMUL
INTRAVENOUS | Status: DC | PRN
  Administered 2017-09-17: 150 ug/kg/min via INTRAVENOUS

## 2017-09-17 MED ORDER — LIDOCAINE HCL (CARDIAC) PF 100 MG/5ML IV SOSY
PREFILLED_SYRINGE | INTRAVENOUS | Status: DC | PRN
  Administered 2017-09-17: 100 mg via INTRAVENOUS

## 2017-09-17 MED ORDER — IPRATROPIUM-ALBUTEROL 0.5-2.5 (3) MG/3ML IN SOLN
3.0000 mL | Freq: Once | RESPIRATORY_TRACT | Status: AC
  Administered 2017-09-17: 3 mL via RESPIRATORY_TRACT

## 2017-09-17 MED ORDER — LIDOCAINE HCL (PF) 2 % IJ SOLN
INTRAMUSCULAR | Status: AC
  Filled 2017-09-17: qty 10

## 2017-09-17 MED ORDER — IPRATROPIUM-ALBUTEROL 0.5-2.5 (3) MG/3ML IN SOLN
RESPIRATORY_TRACT | Status: AC
  Administered 2017-09-17: 3 mL
  Filled 2017-09-17: qty 3

## 2017-09-17 MED ORDER — SODIUM CHLORIDE 0.9 % IV SOLN
INTRAVENOUS | Status: DC
  Administered 2017-09-17: 1000 mL via INTRAVENOUS

## 2017-09-17 MED ORDER — SODIUM CHLORIDE 0.9 % IV SOLN: INTRAVENOUS | Status: DC

## 2017-09-17 MED ORDER — PROPOFOL 10 MG/ML IV BOLUS
INTRAVENOUS | Status: DC | PRN
  Administered 2017-09-17: 50 mg via INTRAVENOUS

## 2017-09-17 MED ORDER — LIDOCAINE HCL (PF) 1 % IJ SOLN
INTRAMUSCULAR | Status: AC
  Filled 2017-09-17: qty 2

## 2017-09-17 NOTE — Anesthesia Preprocedure Evaluation (Signed)
Anesthesia Evaluation  Patient identified by MRN, date of birth, ID band Patient awake    Reviewed: Allergy & Precautions, NPO status , Patient's Chart, lab work & pertinent test results  History of Anesthesia Complications Negative for: history of anesthetic complications  Airway Mallampati: III  TM Distance: >3 FB Neck ROM: Full    Dental  (+) Upper Dentures, Edentulous Lower   Pulmonary asthma , sleep apnea , COPD,  COPD inhaler, former smoker,    breath sounds clear to auscultation- rhonchi (-) wheezing      Cardiovascular Exercise Tolerance: Poor hypertension, Pt. on medications +CHF  (-) CAD, (-) Past MI, (-) Cardiac Stents and (-) CABG  Rhythm:Regular Rate:Normal - Systolic murmurs and - Diastolic murmurs Echo 05/20/00: MILD LV SYSTOLIC DYSFUNCTION (See above) WITH MODERATE LVH NORMAL RIGHT VENTRICULAR SYSTOLIC FUNCTION MILD VALVULAR REGURGITATION (See above) NO VALVULAR STENOSIS MILD to MODERATE TR MILD to MODERATE PHTN EF 40%   Neuro/Psych negative neurological ROS  negative psych ROS   GI/Hepatic Neg liver ROS, GERD  ,  Endo/Other  diabetes, Oral Hypoglycemic Agents  Renal/GU negative Renal ROS     Musculoskeletal  (+) Arthritis ,   Abdominal (+) + obese,   Peds  Hematology negative hematology ROS (+)   Anesthesia Other Findings Past Medical History: No date: Asthma No date: CHF (congestive heart failure) (HCC) No date: COPD (chronic obstructive pulmonary disease) (HCC) No date: Diabetes mellitus, type 2 (HCC) No date: GERD (gastroesophageal reflux disease) No date: HTN (hypertension) No date: Hyperlipidemia No date: Obstructive sleep apnea on CPAP No date: Osteoarthritis No date: Scoliosis   Reproductive/Obstetrics                             Anesthesia Physical Anesthesia Plan  ASA: III  Anesthesia Plan: General   Post-op Pain Management:    Induction:  Intravenous  PONV Risk Score and Plan: 2 and Propofol infusion  Airway Management Planned: Natural Airway  Additional Equipment:   Intra-op Plan:   Post-operative Plan:   Informed Consent: I have reviewed the patients History and Physical, chart, labs and discussed the procedure including the risks, benefits and alternatives for the proposed anesthesia with the patient or authorized representative who has indicated his/her understanding and acceptance.   Dental advisory given  Plan Discussed with: CRNA and Anesthesiologist  Anesthesia Plan Comments:         Anesthesia Quick Evaluation

## 2017-09-17 NOTE — H&P (Signed)
Outpatient short stay form Pre-procedure 09/17/2017 12:46 PM  Lollie Sails MD  Primary Physician: Glendon Axe, MD  Reason for visit: EGD.  History of iron deficiency anemia, abnormal capsule study with gastric ulcer, history of gastric ulcer,  History of present illness: Patient is a 72 year old female presenting today as above.  She has a personal history of iron deficiency anemia with an EGD and colonoscopy being done on 07/10/2016.  The EGD showed a obstructing Schatzki ring small hiatal hernia as well as a linear gastric ulcer.  Is also noted to have on her colonoscopy diverticulosis negative otherwise.  She had a video capsule endoscopy 08/28/2017 showing multiple gastric lesions and an ulcer that had some stigmata of bleeding.  Small intestine portion was normal.  Had been taking NSAIDs.  Mentation at that time was to stop any NSAIDs or aspirin start on a twice daily proton pump inhibitor and serial iron and CBC studies EGD after about 6 to 8 weeks.  She is presenting today for that procedure.    Current Facility-Administered Medications:  .  0.9 %  sodium chloride infusion, , Intravenous, Continuous, Lollie Sails, MD .  0.9 %  sodium chloride infusion, , Intravenous, Continuous, Lollie Sails, MD, Last Rate: 20 mL/hr at 09/17/17 1209, 1,000 mL at 09/17/17 1209 .  ipratropium-albuterol (DUONEB) 0.5-2.5 (3) MG/3ML nebulizer solution 3 mL, 3 mL, Nebulization, Once, [place], Amy, MD .  ipratropium-albuterol (DUONEB) 0.5-2.5 (3) MG/3ML nebulizer solution, , , ,  .  lidocaine (PF) (XYLOCAINE) 1 % injection 2 mL, 2 mL, Intradermal, Once, Lollie Sails, MD .  lidocaine (PF) (XYLOCAINE) 1 % injection, , , ,   Medications Prior to Admission  Medication Sig Dispense Refill Last Dose  . albuterol (PROVENTIL HFA;VENTOLIN HFA) 108 (90 BASE) MCG/ACT inhaler Inhale 2 puffs into the lungs every 6 (six) hours as needed for wheezing or shortness of breath.   09/16/2017  . aspirin 81  MG tablet Take 81 mg by mouth daily.   09/15/2017  . BIDIL 20-37.5 MG tablet TAKE 1 TABLET BY MOUTH THREE TIMES DAILY 270 tablet 3 Taking  . Biotin 1000 MCG tablet Take 1,000 mcg by mouth daily.    Taking  . calcium carbonate (CALCIUM 600) 600 MG TABS tablet Take 600 mg by mouth 2 (two) times daily with a meal.    Taking  . cetirizine (ZYRTEC) 10 MG tablet Take 10 mg by mouth daily.   Not Taking  . cholecalciferol (VITAMIN D) 400 units TABS tablet Take 2,000 Units by mouth daily.    Taking  . Cod Liver Oil w/Vit A & D CAPS Take 1 capsule by mouth daily.   Taking  . ENTRESTO 97-103 MG TAKE ONE TABLET BY MOUTH TWICE DAILY 180 tablet 3 Taking  . ferrous gluconate (FERGON) 324 MG tablet Take 324 mg by mouth daily with breakfast.   Taking  . furosemide (LASIX) 40 MG tablet Take 40 mg by mouth daily.   Taking  . glimepiride (AMARYL) 2 MG tablet Take 2 mg by mouth daily with breakfast.   Taking  . levocetirizine (XYZAL) 5 MG tablet Take 5 mg by mouth every evening.   Taking  . meloxicam (MOBIC) 7.5 MG tablet Take 7.5 mg by mouth 2 (two) times daily as needed for pain. Takes one every other day   Taking  . metFORMIN (GLUCOPHAGE) 500 MG tablet Take 1,000 mg by mouth 2 (two) times daily with a meal.   Taking  . metoprolol (  TOPROL-XL) 200 MG 24 hr tablet TAKE 1 TABLET BY MOUTH ONCE DAILY. TAKE WITH OR IMMEDIATELY FOLLOWING A MEAL. 90 tablet 3 Taking  . montelukast (SINGULAIR) 10 MG tablet Take 10 mg by mouth at bedtime.   Taking  . Multiple Vitamin (MULTIVITAMIN) tablet Take 1 tablet by mouth daily.    Taking  . Omega 3-6-9 Fatty Acids (TRIPLE OMEGA-3-6-9) CAPS Take 1 capsule by mouth daily.   Taking  . omeprazole (PRILOSEC) 20 MG capsule Take 20 mg by mouth 2 (two) times daily before a meal.   Taking  . ondansetron (ZOFRAN-ODT) 4 MG disintegrating tablet Take 4 mg every 8 (eight) hours as needed by mouth for nausea or vomiting.   Taking  . pantoprazole (PROTONIX) 40 MG tablet Take 40 mg by mouth 2 (two)  times daily.   Not Taking  . potassium chloride (K-DUR) 10 MEQ tablet TAKE ONE TABLET BY MOUTH ONCE DAILY 90 tablet 3 Taking  . rosuvastatin (CRESTOR) 20 MG tablet Take 20 mg by mouth daily.   Taking  . Tiotropium Bromide Monohydrate (SPIRIVA RESPIMAT) 1.25 MCG/ACT AERS Inhale 2 puffs into the lungs daily.   Taking  . traMADol-acetaminophen (ULTRACET) 37.5-325 MG tablet Take 1 tablet by mouth every 6 (six) hours as needed. Takes twice a day   Taking  . traZODone (DESYREL) 100 MG tablet Take 100 mg by mouth at bedtime.   Taking  . vitamin B-12 (CYANOCOBALAMIN) 500 MCG tablet Take 500 mcg daily by mouth.   Taking  . vitamin C (ASCORBIC ACID) 500 MG tablet Take 500 mg by mouth daily.    Taking  . vitamin E 400 UNIT capsule Take 400 Units by mouth daily.   Taking     Allergies  Allergen Reactions  . Amoxicillin Other (See Comments)    GI distress  . Aspirin Diarrhea and Nausea And Vomiting  . Celebrex [Celecoxib] Nausea Only  . Latex Itching and Swelling  . Penicillins Other (See Comments)    GI distress  . Allegra [Fexofenadine] Cough     Past Medical History:  Diagnosis Date  . Asthma   . CHF (congestive heart failure) ([place])   . COPD (chronic obstructive pulmonary disease) ([place])   . Diabetes mellitus, type 2 ([place])   . GERD (gastroesophageal reflux disease)   . HTN (hypertension)   . Hyperlipidemia   . Obstructive sleep apnea on CPAP   . Osteoarthritis   . Scoliosis     Review of systems:      Physical Exam    Heart and lungs: Regular rate and rhythm without rub or gallop, lungs are bilaterally clear.    HEENT: Normocephalic atraumatic eyes are anicteric    Other:    Pertinant exam for procedure: Soft nontender nondistended bowel sounds positive normoactive    Planned proceedures: EGD and indicated procedures. I have discussed the risks benefits and complications of procedures to include not limited to bleeding, infection, perforation and the risk of sedation and  the patient wishes to proceed.    Lollie Sails, MD Gastroenterology 09/17/2017  12:46 PM

## 2017-09-17 NOTE — Transfer of Care (Signed)
Immediate Anesthesia Transfer of Care Note  Patient: Diane Patton  Procedure(s) Performed: ESOPHAGOGASTRODUODENOSCOPY (EGD) WITH PROPOFOL (N/A )  Patient Location: PACU  Anesthesia Type:General  Level of Consciousness: awake, alert  and oriented  Airway & Oxygen Therapy: Patient Spontanous Breathing and Patient connected to nasal cannula oxygen  Post-op Assessment: Report given to RN and Post -op Vital signs reviewed and stable  Post vital signs: Reviewed and stable  Last Vitals:  Vitals Value Taken Time  BP    Temp    Pulse 91 09/17/2017  1:25 PM  Resp 14 09/17/2017  1:25 PM  SpO2 99 % 09/17/2017  1:25 PM  Vitals shown include unvalidated device data.  Last Pain:  Vitals:   09/17/17 1126  TempSrc: Tympanic  PainSc: 0-No pain         Complications: No apparent anesthesia complications

## 2017-09-17 NOTE — Op Note (Addendum)
[facility] Gastroenterology Patient Name: Zafira Munos Procedure Date: 09/17/2017 12:54 PM MRN: [id number] Account #: [id number] Date of Birth: December 29, 1945 Admit Type: Outpatient Age: 72 Room: [facility] ENDO ROOM 3 Gender: Female Note Status: Finalized Procedure:            Upper GI endoscopy Indications:          Iron deficiency anemia, Follow-up of gastric ulcer Providers:            Lollie Sails, MD Referring MD:         Glendon Axe (Referring MD) Medicines:            Monitored Anesthesia Care Complications:        No immediate complications. Procedure:            Pre-Anesthesia Assessment:                       - ASA Grade Assessment: III - A patient with severe                        systemic disease.                       After obtaining informed consent, the endoscope was                        passed under direct vision. Throughout the procedure,                        the patient's blood pressure, pulse, and oxygen                        saturations were monitored continuously. The Endoscope                        was introduced through the mouth, and advanced to the                        third part of duodenum. The upper GI endoscopy was                        accomplished without difficulty. The patient tolerated                        the procedure well. Findings:      The examined esophagus was normal.      A small hiatal hernia was present.      Multiple dispersed, 1 to 4 mm non-bleeding erosions were found in the       gastric antrum. There were no stigmata of recent bleeding. Biopsies were       taken with a cold forceps for histology.      The cardia and gastric fundus were normal on retroflexion otherwise.      The examined duodenum was normal.      There was a small lipoma, 15 mm in diameter, on the posterior wall of       the gastric antrum, unchanged in appearance from last year. Biopsies       were taken with a cold forceps for  histology. Impression:           - Normal esophagus.                       -  Small hiatal hernia.                       - Non-bleeding erosive gastropathy. Biopsied.                       - Normal examined duodenum. Recommendation:       - Discharge patient to home.                       - Use Protonix (pantoprazole) 40 mg PO BID daily.                       - Await pathology results.                       - No aspirin, ibuprofen, naproxen, or other                        non-steroidal anti-inflammatory drugs. Procedure Code(s):    --- Professional ---                       [phone], Esophagogastroduodenoscopy, flexible, transoral;                        with biopsy, single or multiple Diagnosis Code(s):    --- Professional ---                       K44.9, Diaphragmatic hernia without obstruction or                        gangrene                       K31.89, Other diseases of stomach and duodenum                       D50.9, Iron deficiency anemia, unspecified                       K25.9, Gastric ulcer, unspecified as acute or chronic,                        without hemorrhage or perforation CPT copyright 2017 American Medical Association. All rights reserved. The codes documented in this report are preliminary and upon coder review may  be revised to meet current compliance requirements. Lollie Sails, MD 09/17/2017 1:24:05 PM This report has been signed electronically. Number of Addenda: 0 Note Initiated On: 09/17/2017 12:54 PM      [facility]

## 2017-09-17 NOTE — Anesthesia Post-op Follow-up Note (Signed)
Anesthesia QCDR form completed.        

## 2017-09-17 NOTE — Anesthesia Postprocedure Evaluation (Signed)
Anesthesia Post Note  Patient: Diane Patton  Procedure(s) Performed: ESOPHAGOGASTRODUODENOSCOPY (EGD) WITH PROPOFOL (N/A )  Patient location during evaluation: Endoscopy Anesthesia Type: General Level of consciousness: awake and alert and oriented Pain management: pain level controlled Vital Signs Assessment: post-procedure vital signs reviewed and stable Respiratory status: spontaneous breathing, nonlabored ventilation and respiratory function stable Cardiovascular status: blood pressure returned to baseline and stable Postop Assessment: no signs of nausea or vomiting Anesthetic complications: no     Last Vitals:  Vitals:   09/17/17 1345 09/17/17 1355  BP: (!) 152/83 (!) 164/75  Pulse: 85   Resp: 19   Temp:    SpO2: 98%     Last Pain:  Vitals:   09/17/17 1342  TempSrc:   PainSc: 0-No pain                 Eliaz Fout

## 2017-09-17 NOTE — Anesthesia Procedure Notes (Signed)
Performed by: Levell Tavano, CRNA Pre-anesthesia Checklist: Patient identified, Suction available, Emergency Drugs available, Patient being monitored and Timeout performed Patient Re-evaluated:Patient Re-evaluated prior to induction Oxygen Delivery Method: Nasal cannula Induction Type: IV induction       

## 2017-09-18 ENCOUNTER — Other Ambulatory Visit: Payer: Self-pay | Admitting: Family

## 2017-09-20 LAB — SURGICAL PATHOLOGY

## 2018-06-18 ENCOUNTER — Other Ambulatory Visit: Payer: Self-pay | Admitting: Family

## 2018-06-21 ENCOUNTER — Other Ambulatory Visit: Payer: Self-pay | Admitting: Family

## 2018-06-23 ENCOUNTER — Other Ambulatory Visit: Payer: Self-pay | Admitting: Family

## 2018-09-18 ENCOUNTER — Other Ambulatory Visit: Payer: Self-pay | Admitting: Family

## 2018-10-13 ENCOUNTER — Other Ambulatory Visit: Payer: Self-pay | Admitting: Internal Medicine

## 2018-10-13 DIAGNOSIS — Z1231 Encounter for screening mammogram for malignant neoplasm of breast: Secondary | ICD-10-CM

## 2018-10-30 ENCOUNTER — Emergency Department: Payer: Medicare Other

## 2018-10-30 ENCOUNTER — Other Ambulatory Visit: Payer: Self-pay

## 2018-10-30 ENCOUNTER — Observation Stay
Admission: EM | Admit: 2018-10-30 | Discharge: 2018-10-31 | Disposition: A | Discharge status: Home / Self Care | Payer: Medicare Other | Attending: Internal Medicine | Admitting: Internal Medicine

## 2018-10-30 DIAGNOSIS — I5042 Chronic combined systolic (congestive) and diastolic (congestive) heart failure: Secondary | ICD-10-CM | POA: Diagnosis not present

## 2018-10-30 DIAGNOSIS — Z91040 Latex allergy status: Secondary | ICD-10-CM | POA: Diagnosis not present

## 2018-10-30 DIAGNOSIS — E875 Hyperkalemia: Secondary | ICD-10-CM | POA: Insufficient documentation

## 2018-10-30 DIAGNOSIS — J449 Chronic obstructive pulmonary disease, unspecified: Secondary | ICD-10-CM | POA: Insufficient documentation

## 2018-10-30 DIAGNOSIS — R509 Fever, unspecified: Secondary | ICD-10-CM

## 2018-10-30 DIAGNOSIS — R112 Nausea with vomiting, unspecified: Secondary | ICD-10-CM | POA: Diagnosis present

## 2018-10-30 DIAGNOSIS — G4733 Obstructive sleep apnea (adult) (pediatric): Secondary | ICD-10-CM | POA: Diagnosis not present

## 2018-10-30 DIAGNOSIS — E119 Type 2 diabetes mellitus without complications: Secondary | ICD-10-CM | POA: Diagnosis not present

## 2018-10-30 DIAGNOSIS — R197 Diarrhea, unspecified: Secondary | ICD-10-CM

## 2018-10-30 DIAGNOSIS — E782 Mixed hyperlipidemia: Secondary | ICD-10-CM | POA: Diagnosis not present

## 2018-10-30 DIAGNOSIS — Z20828 Contact with and (suspected) exposure to other viral communicable diseases: Secondary | ICD-10-CM | POA: Insufficient documentation

## 2018-10-30 DIAGNOSIS — M1990 Unspecified osteoarthritis, unspecified site: Secondary | ICD-10-CM | POA: Insufficient documentation

## 2018-10-30 DIAGNOSIS — Z87891 Personal history of nicotine dependence: Secondary | ICD-10-CM | POA: Insufficient documentation

## 2018-10-30 DIAGNOSIS — Z791 Long term (current) use of non-steroidal anti-inflammatories (NSAID): Secondary | ICD-10-CM | POA: Diagnosis not present

## 2018-10-30 DIAGNOSIS — Z8249 Family history of ischemic heart disease and other diseases of the circulatory system: Secondary | ICD-10-CM | POA: Diagnosis not present

## 2018-10-30 DIAGNOSIS — Z79899 Other long term (current) drug therapy: Secondary | ICD-10-CM | POA: Diagnosis not present

## 2018-10-30 DIAGNOSIS — R1084 Generalized abdominal pain: Secondary | ICD-10-CM | POA: Diagnosis present

## 2018-10-30 DIAGNOSIS — K219 Gastro-esophageal reflux disease without esophagitis: Secondary | ICD-10-CM | POA: Diagnosis not present

## 2018-10-30 DIAGNOSIS — A419 Sepsis, unspecified organism: Secondary | ICD-10-CM | POA: Insufficient documentation

## 2018-10-30 DIAGNOSIS — I110 Hypertensive heart disease with heart failure: Secondary | ICD-10-CM | POA: Diagnosis not present

## 2018-10-30 DIAGNOSIS — Z7982 Long term (current) use of aspirin: Secondary | ICD-10-CM | POA: Diagnosis not present

## 2018-10-30 DIAGNOSIS — K529 Noninfective gastroenteritis and colitis, unspecified: Secondary | ICD-10-CM | POA: Diagnosis not present

## 2018-10-30 DIAGNOSIS — Z886 Allergy status to analgesic agent status: Secondary | ICD-10-CM | POA: Diagnosis not present

## 2018-10-30 DIAGNOSIS — Z880 Allergy status to penicillin: Secondary | ICD-10-CM | POA: Diagnosis not present

## 2018-10-30 DIAGNOSIS — Z7984 Long term (current) use of oral hypoglycemic drugs: Secondary | ICD-10-CM | POA: Diagnosis not present

## 2018-10-30 LAB — COMPREHENSIVE METABOLIC PANEL
ALT: 18 U/L (ref 0–44)
AST: 26 U/L (ref 15–41)
Albumin: 4.5 g/dL (ref 3.5–5.0)
Alkaline Phosphatase: 35 U/L — ABNORMAL LOW (ref 38–126)
Anion gap: 10 (ref 5–15)
BUN: 21 mg/dL (ref 8–23)
CO2: 25 mmol/L (ref 22–32)
Calcium: 10.9 mg/dL — ABNORMAL HIGH (ref 8.9–10.3)
Chloride: 105 mmol/L (ref 98–111)
Creatinine, Ser: 0.91 mg/dL (ref 0.44–1.00)
GFR calc Af Amer: >60 mL/min (ref >60)
GFR calc non Af Amer: >60 mL/min (ref >60)
Glucose, Bld: 94 mg/dL (ref 70–99)
Potassium: 5.5 mmol/L — ABNORMAL HIGH (ref 3.5–5.1)
Sodium: 140 mmol/L (ref 135–145)
Total Bilirubin: 1.6 mg/dL — ABNORMAL HIGH (ref 0.3–1.2)
Total Protein: 7.2 g/dL (ref 6.5–8.1)

## 2018-10-30 LAB — BASIC METABOLIC PANEL
Anion gap: 8 (ref 5–15)
BUN: 23 mg/dL (ref 8–23)
CO2: 24 mmol/L (ref 22–32)
Calcium: 9.4 mg/dL (ref 8.9–10.3)
Chloride: 109 mmol/L (ref 98–111)
Creatinine, Ser: 1.02 mg/dL — ABNORMAL HIGH (ref 0.44–1.00)
GFR calc Af Amer: >60 mL/min (ref >60)
GFR calc non Af Amer: 54 mL/min — ABNORMAL LOW (ref >60)
Glucose, Bld: 94 mg/dL (ref 70–99)
Potassium: 3.8 mmol/L (ref 3.5–5.1)
Sodium: 141 mmol/L (ref 135–145)

## 2018-10-30 LAB — CBC WITH DIFFERENTIAL/PLATELET
Abs Immature Granulocytes: 0.07 10*3/uL (ref 0.00–0.07)
Basophils Absolute: 0 10*3/uL (ref 0.0–0.1)
Basophils Relative: 0 %
Eosinophils Absolute: 0.1 10*3/uL (ref 0.0–0.5)
Eosinophils Relative: 1 %
HCT: 43.9 % (ref 36.0–46.0)
Hemoglobin: 13.8 g/dL (ref 12.0–15.0)
Immature Granulocytes: 1 %
Lymphocytes Relative: 24 %
Lymphs Abs: 2.8 10*3/uL (ref 0.7–4.0)
MCH: 30.6 pg (ref 26.0–34.0)
MCHC: 31.4 g/dL (ref 30.0–36.0)
MCV: 97.3 fL (ref 80.0–100.0)
Monocytes Absolute: 0.4 10*3/uL (ref 0.1–1.0)
Monocytes Relative: 4 %
Neutro Abs: 8.6 10*3/uL — ABNORMAL HIGH (ref 1.7–7.7)
Neutrophils Relative %: 70 %
Platelets: 301 10*3/uL (ref 150–400)
RBC: 4.51 MIL/uL (ref 3.87–5.11)
RDW: 12.9 % (ref 11.5–15.5)
WBC: 12 10*3/uL — ABNORMAL HIGH (ref 4.0–10.5)
nRBC: 0.3 % — ABNORMAL HIGH (ref 0.0–0.2)

## 2018-10-30 LAB — URINALYSIS, COMPLETE (UACMP) WITH MICROSCOPIC
Bacteria, UA: NONE SEEN
Bilirubin Urine: NEGATIVE
Glucose, UA: NEGATIVE mg/dL
Hgb urine dipstick: NEGATIVE
Ketones, ur: NEGATIVE mg/dL
Leukocytes,Ua: NEGATIVE
Nitrite: NEGATIVE
Protein, ur: NEGATIVE mg/dL
Specific Gravity, Urine: 1.04 — ABNORMAL HIGH (ref 1.005–1.030)
Squamous Epithelial / HPF: NONE SEEN (ref 0–5)
pH: 6 (ref 5.0–8.0)

## 2018-10-30 LAB — BRAIN NATRIURETIC PEPTIDE: B Natriuretic Peptide: 169 pg/mL — ABNORMAL HIGH (ref 0.0–100.0)

## 2018-10-30 LAB — LIPASE, BLOOD: Lipase: 53 U/L — ABNORMAL HIGH (ref 11–51)

## 2018-10-30 LAB — GLUCOSE, CAPILLARY
Glucose-Capillary: 63 mg/dL — ABNORMAL LOW (ref 70–99)
Glucose-Capillary: 72 mg/dL (ref 70–99)
Glucose-Capillary: 108 mg/dL — ABNORMAL HIGH (ref 70–99)

## 2018-10-30 LAB — SARS CORONAVIRUS 2 BY RT PCR (HOSPITAL ORDER, PERFORMED IN ~~LOC~~ HOSPITAL LAB): SARS Coronavirus 2: NEGATIVE

## 2018-10-30 LAB — PROTIME-INR
INR: 1 (ref 0.8–1.2)
Prothrombin Time: 12.9 seconds (ref 11.4–15.2)

## 2018-10-30 LAB — LACTIC ACID, PLASMA
Lactic Acid, Venous: 2.4 mmol/L (ref 0.5–1.9)
Lactic Acid, Venous: 1.8 mmol/L (ref 0.5–1.9)

## 2018-10-30 LAB — PROCALCITONIN: Procalcitonin: <0.1 ng/mL

## 2018-10-30 MED ORDER — TRAZODONE HCL 50 MG PO TABS
100.0000 mg | ORAL_TABLET | Freq: Every day | ORAL | Status: DC
  Filled 2018-10-30: qty 2

## 2018-10-30 MED ORDER — INSULIN ASPART 100 UNIT/ML ~~LOC~~ SOLN
0.0000 [IU] | Freq: Three times a day (TID) | SUBCUTANEOUS | Status: DC
  Administered 2018-10-30: 12:00:00 1 [IU] via SUBCUTANEOUS
  Filled 2018-10-30: qty 1

## 2018-10-30 MED ORDER — OMEGA-3-ACID ETHYL ESTERS 1 G PO CAPS
1.0000 | ORAL_CAPSULE | Freq: Every day | ORAL | Status: DC
  Administered 2018-10-30 – 2018-10-31 (×2): 1 g via ORAL
  Filled 2018-10-30 (×2): qty 1

## 2018-10-30 MED ORDER — ASPIRIN EC 81 MG PO TBEC
81.0000 mg | DELAYED_RELEASE_TABLET | Freq: Every day | ORAL | Status: DC
  Administered 2018-10-30 – 2018-10-31 (×2): 81 mg via ORAL
  Filled 2018-10-30 (×2): qty 1

## 2018-10-30 MED ORDER — MELOXICAM 7.5 MG PO TABS
7.5000 mg | ORAL_TABLET | ORAL | Status: DC
  Administered 2018-10-30: 10:00:00 7.5 mg via ORAL
  Filled 2018-10-30: qty 1

## 2018-10-30 MED ORDER — VITAMIN D 25 MCG (1000 UNIT) PO TABS
2000.0000 [IU] | ORAL_TABLET | Freq: Every day | ORAL | Status: DC
  Administered 2018-10-30 – 2018-10-31 (×2): 2000 [IU] via ORAL
  Filled 2018-10-30 (×2): qty 2

## 2018-10-30 MED ORDER — GLIMEPIRIDE 2 MG PO TABS
2.0000 mg | ORAL_TABLET | Freq: Every day | ORAL | Status: DC
  Administered 2018-10-30: 10:00:00 2 mg via ORAL
  Filled 2018-10-30: qty 1

## 2018-10-30 MED ORDER — ENOXAPARIN SODIUM 40 MG/0.4ML ~~LOC~~ SOLN
40.0000 mg | SUBCUTANEOUS | Status: DC
  Administered 2018-10-30: 40 mg via SUBCUTANEOUS
  Filled 2018-10-30: qty 0.4

## 2018-10-30 MED ORDER — INSULIN ASPART 100 UNIT/ML ~~LOC~~ SOLN: 0.0000 [IU] | Freq: Every day | SUBCUTANEOUS | Status: DC

## 2018-10-30 MED ORDER — BIOTIN 1000 MCG PO TABS: 1000.0000 ug | ORAL_TABLET | Freq: Every day | ORAL | Status: DC

## 2018-10-30 MED ORDER — SODIUM CHLORIDE 0.9 % IV BOLUS
1000.0000 mL | Freq: Once | INTRAVENOUS | Status: AC
  Administered 2018-10-30: 07:00:00 1000 mL via INTRAVENOUS

## 2018-10-30 MED ORDER — ONDANSETRON HCL 4 MG PO TABS: 4.0000 mg | ORAL_TABLET | Freq: Four times a day (QID) | ORAL | Status: DC | PRN

## 2018-10-30 MED ORDER — CALCIUM CARBONATE ANTACID 500 MG PO CHEW
600.0000 mg | CHEWABLE_TABLET | Freq: Two times a day (BID) | ORAL | Status: DC
  Administered 2018-10-30 – 2018-10-31 (×3): 600 mg via ORAL
  Filled 2018-10-30 (×3): qty 3

## 2018-10-30 MED ORDER — SODIUM CHLORIDE 0.9 % IV SOLN
1.0000 g | INTRAVENOUS | Status: DC
  Administered 2018-10-30: 1 g via INTRAVENOUS
  Filled 2018-10-30: qty 10
  Filled 2018-10-30: qty 1

## 2018-10-30 MED ORDER — FERROUS GLUCONATE 324 (38 FE) MG PO TABS
324.0000 mg | ORAL_TABLET | Freq: Every day | ORAL | Status: DC
  Administered 2018-10-30 – 2018-10-31 (×2): 324 mg via ORAL
  Filled 2018-10-30 (×2): qty 1

## 2018-10-30 MED ORDER — VITAMIN C 500 MG PO TABS
500.0000 mg | ORAL_TABLET | Freq: Every day | ORAL | Status: DC
  Administered 2018-10-30 – 2018-10-31 (×2): 500 mg via ORAL
  Filled 2018-10-30 (×2): qty 1

## 2018-10-30 MED ORDER — ROSUVASTATIN CALCIUM 20 MG PO TABS
20.0000 mg | ORAL_TABLET | Freq: Every day | ORAL | Status: DC
  Administered 2018-10-30 – 2018-10-31 (×2): 20 mg via ORAL
  Filled 2018-10-30 (×2): qty 1

## 2018-10-30 MED ORDER — TRAMADOL-ACETAMINOPHEN 37.5-325 MG PO TABS
1.0000 | ORAL_TABLET | Freq: Four times a day (QID) | ORAL | Status: DC | PRN
  Filled 2018-10-30: qty 1

## 2018-10-30 MED ORDER — SODIUM CHLORIDE 0.9 % IV BOLUS (SEPSIS)
500.0000 mL | Freq: Once | INTRAVENOUS | Status: AC
  Administered 2018-10-30: 04:00:00 500 mL via INTRAVENOUS

## 2018-10-30 MED ORDER — ONDANSETRON HCL 4 MG/2ML IJ SOLN: 4.0000 mg | Freq: Four times a day (QID) | INTRAMUSCULAR | Status: DC | PRN

## 2018-10-30 MED ORDER — SODIUM CHLORIDE 0.9 % IV SOLN
2.0000 g | Freq: Once | INTRAVENOUS | Status: AC
  Administered 2018-10-30: 2 g via INTRAVENOUS
  Filled 2018-10-30: qty 2

## 2018-10-30 MED ORDER — VITAMIN E 180 MG (400 UNIT) PO CAPS
400.0000 [IU] | ORAL_CAPSULE | Freq: Every day | ORAL | Status: DC
  Administered 2018-10-30 – 2018-10-31 (×2): 400 [IU] via ORAL
  Filled 2018-10-30 (×2): qty 1

## 2018-10-30 MED ORDER — SODIUM CHLORIDE 0.9 % IV SOLN
INTRAVENOUS | Status: DC
  Administered 2018-10-30 – 2018-10-31 (×3): via INTRAVENOUS

## 2018-10-30 MED ORDER — VANCOMYCIN HCL 1.5 G IV SOLR
1500.0000 mg | Freq: Once | INTRAVENOUS | Status: AC
  Administered 2018-10-30: 04:00:00 1500 mg via INTRAVENOUS
  Filled 2018-10-30: qty 1500

## 2018-10-30 MED ORDER — LEVOCETIRIZINE DIHYDROCHLORIDE 5 MG PO TABS: 5.0000 mg | ORAL_TABLET | Freq: Every evening | ORAL | Status: DC

## 2018-10-30 MED ORDER — ALBUTEROL SULFATE (2.5 MG/3ML) 0.083% IN NEBU: 3.0000 mL | INHALATION_SOLUTION | Freq: Four times a day (QID) | RESPIRATORY_TRACT | Status: DC | PRN

## 2018-10-30 MED ORDER — TIOTROPIUM BROMIDE MONOHYDRATE 18 MCG IN CAPS
1.0000 | ORAL_CAPSULE | Freq: Every day | RESPIRATORY_TRACT | Status: DC
  Administered 2018-10-30 – 2018-10-31 (×2): 18 ug via RESPIRATORY_TRACT
  Filled 2018-10-30: qty 5

## 2018-10-30 MED ORDER — CYANOCOBALAMIN 500 MCG PO TABS
500.0000 ug | ORAL_TABLET | Freq: Every day | ORAL | Status: DC
  Administered 2018-10-30 – 2018-10-31 (×2): 500 ug via ORAL
  Filled 2018-10-30 (×2): qty 1

## 2018-10-30 MED ORDER — METOPROLOL SUCCINATE ER 50 MG PO TB24
200.0000 mg | ORAL_TABLET | Freq: Every day | ORAL | Status: DC
  Administered 2018-10-30 – 2018-10-31 (×2): 200 mg via ORAL
  Filled 2018-10-30 (×2): qty 4

## 2018-10-30 MED ORDER — MONTELUKAST SODIUM 10 MG PO TABS
10.0000 mg | ORAL_TABLET | Freq: Every day | ORAL | Status: DC
  Administered 2018-10-30: 23:00:00 10 mg via ORAL
  Filled 2018-10-30: qty 1

## 2018-10-30 MED ORDER — LORATADINE 10 MG PO TABS
10.0000 mg | ORAL_TABLET | Freq: Every day | ORAL | Status: DC
  Administered 2018-10-30 – 2018-10-31 (×2): 10 mg via ORAL
  Filled 2018-10-30 (×2): qty 1

## 2018-10-30 MED ORDER — ACETAMINOPHEN 325 MG PO TABS: 650.0000 mg | ORAL_TABLET | Freq: Four times a day (QID) | ORAL | Status: DC | PRN

## 2018-10-30 MED ORDER — ACETAMINOPHEN 650 MG RE SUPP: 650.0000 mg | Freq: Four times a day (QID) | RECTAL | Status: DC | PRN

## 2018-10-30 MED ORDER — ONDANSETRON 4 MG PO TBDP: 4.0000 mg | ORAL_TABLET | Freq: Three times a day (TID) | ORAL | Status: DC | PRN

## 2018-10-30 MED ORDER — POTASSIUM CHLORIDE CRYS ER 10 MEQ PO TBCR: 10.0000 meq | EXTENDED_RELEASE_TABLET | Freq: Every day | ORAL | Status: DC

## 2018-10-30 MED ORDER — METRONIDAZOLE IN NACL 5-0.79 MG/ML-% IV SOLN
500.0000 mg | Freq: Once | INTRAVENOUS | Status: AC
  Administered 2018-10-30: 07:00:00 500 mg via INTRAVENOUS
  Filled 2018-10-30 (×2): qty 100

## 2018-10-30 MED ORDER — IOHEXOL 300 MG/ML  SOLN
100.0000 mL | Freq: Once | INTRAMUSCULAR | Status: AC | PRN
  Administered 2018-10-30: 100 mL via INTRAVENOUS

## 2018-10-30 MED ORDER — ISOSORB DINITRATE-HYDRALAZINE 20-37.5 MG PO TABS
1.0000 | ORAL_TABLET | Freq: Three times a day (TID) | ORAL | Status: DC
  Administered 2018-10-30: 10:00:00 1 via ORAL
  Filled 2018-10-30 (×3): qty 1

## 2018-10-30 MED ORDER — PANTOPRAZOLE SODIUM 40 MG PO TBEC
40.0000 mg | DELAYED_RELEASE_TABLET | Freq: Two times a day (BID) | ORAL | Status: DC
  Administered 2018-10-30 – 2018-10-31 (×3): 40 mg via ORAL
  Filled 2018-10-30 (×3): qty 1

## 2018-10-30 MED ORDER — MAGNESIUM HYDROXIDE 400 MG/5ML PO SUSP
30.0000 mL | Freq: Every day | ORAL | Status: DC | PRN
  Filled 2018-10-30: qty 30

## 2018-10-30 MED ORDER — VANCOMYCIN HCL IN DEXTROSE 1-5 GM/200ML-% IV SOLN: 1000.0000 mg | Freq: Once | INTRAVENOUS | Status: DC

## 2018-10-30 MED ORDER — FUROSEMIDE 40 MG PO TABS: 40.0000 mg | ORAL_TABLET | Freq: Every day | ORAL | Status: DC

## 2018-10-30 MED ORDER — TRAZODONE HCL 50 MG PO TABS: 25.0000 mg | ORAL_TABLET | Freq: Every evening | ORAL | Status: DC | PRN

## 2018-10-30 NOTE — H&P (Signed)
[facility] NAME: Diane Patton    MR#:  [id number]  DATE OF BIRTH:  23-Jun-1945  DATE OF ADMISSION:  10/30/2018  PRIMARY CARE PHYSICIAN: Tracie Harrier, MD   REQUESTING/REFERRING PHYSICIAN: Dr. Karma Greaser  CHIEF COMPLAINT:   Chief Complaint  Patient presents with  . Nausea  . Emesis  . Diarrhea    HISTORY OF PRESENT ILLNESS:  Diane Patton  is a 73 y.o. female with a known history of hypertension, diabetes mellitus, COPD, diastolic CHF, hyperlipidemia presents to the emergency room complaining of 2 days of nausea, vomiting, diarrhea.  She does have intermittent diarrhea that she attributes to iron supplements.  This time the diarrhea has been severe along with abdominal pain.  She was found to be febrile, tachycardic and Gehres count of 12.  Lactic acid elevated at 2.4.  Patient is being admitted with gastroenteritis and sepsis. No recent antibiotic use.  No recent hospital stay.  PAST MEDICAL HISTORY:   Past Medical History:  Diagnosis Date  . Asthma   . CHF (congestive heart failure) ([place])   . COPD (chronic obstructive pulmonary disease) ([place])   . Diabetes mellitus, type 2 ([place])   . GERD (gastroesophageal reflux disease)   . HTN (hypertension)   . Hyperlipidemia   . Obstructive sleep apnea on CPAP   . Osteoarthritis   . Scoliosis     PAST SURGICAL HISTORY:   Past Surgical History:  Procedure Laterality Date  . ABDOMINAL HYSTERECTOMY    . ankle (other)    . APPENDECTOMY    . BREAST BIOPSY Right 1999   neg  . BREAST CYST ASPIRATION     neg  . BREAST SURGERY    . COLONOSCOPY WITH PROPOFOL N/A 07/10/2016   Procedure: COLONOSCOPY WITH PROPOFOL;  Surgeon: Lollie Sails, MD;  Location[facility] ENDOSCOPY;  Service: Endoscopy;  Laterality: N/A;  . ear (otheR)    . ESOPHAGOGASTRODUODENOSCOPY (EGD) WITH PROPOFOL N/A 07/10/2016   Procedure: ESOPHAGOGASTRODUODENOSCOPY (EGD) WITH PROPOFOL;  Surgeon: Lollie Sails, MD;   Location: [facility] ENDOSCOPY;  Service: Endoscopy;  Laterality: N/A;  . ESOPHAGOGASTRODUODENOSCOPY (EGD) WITH PROPOFOL N/A 09/17/2017   Procedure: ESOPHAGOGASTRODUODENOSCOPY (EGD) WITH PROPOFOL;  Surgeon: Lollie Sails, MD;  Location: [facility] ENDOSCOPY;  Service: Endoscopy;  Laterality: N/A;  . feet (other)    . sinus (other)    . stomach    . UPPER ESOPHAGEAL ENDOSCOPIC ULTRASOUND (EUS) N/A 11/22/2016   Procedure: UPPER ESOPHAGEAL ENDOSCOPIC ULTRASOUND (EUS);  Surgeon: Reita Cliche, MD;  Location: [facility] ENDOSCOPY;  Service: Gastroenterology;  Laterality: N/A;    SOCIAL HISTORY:   Social History   Tobacco Use  . Smoking status: Former Smoker    Packs/day: 1.00    Years: 15.00    Pack years: 15.00    Types: Cigarettes    Quit date: 07/21/1982    Years since quitting: 36.3  . Smokeless tobacco: Never Used  Substance Use Topics  . Alcohol use: No    Alcohol/week: 15.0 standard drinks    Types: 15 Standard drinks or equivalent per week    Comment: quit drinking 04/1981    FAMILY HISTORY:   Family History  Problem Relation Age of Onset  . Alzheimer's disease Other   . Heart attack Mother   . Hypertension Mother   . Cancer Brother        lung  . Cancer Maternal Aunt        mouth and breast  . Breast  cancer Maternal Aunt   . Cancer Daughter        throat    DRUG ALLERGIES:   Allergies  Allergen Reactions  . Amoxicillin Other (See Comments)    GI distress  . Aspirin Diarrhea and Nausea And Vomiting  . Celebrex [Celecoxib] Nausea Only  . Latex Itching and Swelling  . Penicillins Other (See Comments)    GI distress  . Allegra [Fexofenadine] Cough    REVIEW OF SYSTEMS:   Review of Systems  Constitutional: Positive for chills, fever and malaise/fatigue. Negative for weight loss.  HENT: Negative for hearing loss and nosebleeds.   Eyes: Negative for blurred vision, double vision and pain.  Respiratory: Negative for cough, hemoptysis, sputum production, shortness of  breath and wheezing.   Cardiovascular: Negative for chest pain, palpitations, orthopnea and leg swelling.  Gastrointestinal: Positive for abdominal pain, diarrhea, nausea and vomiting. Negative for constipation.  Genitourinary: Negative for dysuria and hematuria.  Musculoskeletal: Negative for back pain, falls and myalgias.  Skin: Negative for rash.  Neurological: Positive for dizziness. Negative for tremors, sensory change, speech change, focal weakness, seizures and headaches.  Endo/Heme/Allergies: Does not bruise/bleed easily.  Psychiatric/Behavioral: Negative for depression and memory loss. The patient is not nervous/anxious.     MEDICATIONS AT HOME:   Prior to Admission medications   Medication Sig Start Date End Date Taking? Authorizing Provider  aspirin 81 MG tablet Take 81 mg by mouth daily.   Yes [provider]  BIDIL 20-37.5 MG tablet TAKE 1 TABLET BY MOUTH THREE TIMES DAILY Patient taking differently: Take 1 tablet by mouth 3 (three) times daily.  05/20/17  Yes Darylene Price A, FNP  Biotin 1000 MCG tablet Take 1,000 mcg by mouth daily.    Yes [provider]  calcium carbonate (CALCIUM 600) 600 MG TABS tablet Take 600 mg by mouth 2 (two) times daily with a meal.    Yes [provider]  cholecalciferol (VITAMIN D) 400 units TABS tablet Take 2,000 Units by mouth daily.    Yes [provider]  [facility] Liver Oil w/Vit A & D CAPS Take 1 capsule by mouth daily.   Yes [provider]  ENTRESTO 97-103 MG TAKE 1 TABLET BY MOUTH TWICE DAILY Patient taking differently: Take 1 tablet by mouth 2 (two) times daily.  09/18/17  Yes Hackney, Otila Kluver A, FNP  ferrous gluconate (FERGON) 324 MG tablet Take 324 mg by mouth daily with breakfast.   Yes [provider]  furosemide (LASIX) 40 MG tablet Take 40 mg by mouth daily.   Yes [provider]  glimepiride (AMARYL) 2 MG tablet Take 2 mg by mouth daily with breakfast.   Yes [provider]  levocetirizine (XYZAL) 5 MG tablet Take 5 mg by mouth every evening.   Yes [provider]  meloxicam (MOBIC) 7.5 MG tablet Take 7.5 mg by mouth 2 (two) times daily as needed for pain. Takes one every other day   Yes [provider]  metFORMIN (GLUCOPHAGE) 500 MG tablet Take 1,000 mg by mouth 2 (two) times daily with a meal.   Yes [provider]  metoprolol (TOPROL-XL) 200 MG 24 hr tablet TAKE 1 TABLET BY MOUTH ONCE DAILY. TAKE WITH OR IMMEDIATELY FOLLOWING A MEAL. Patient taking differently: Take 200 mg by mouth daily.  05/20/17  Yes Hackney, Tina A, FNP  montelukast (SINGULAIR) 10 MG tablet Take 10 mg by mouth at bedtime.   Yes [provider]  Multiple Vitamin (MULTIVITAMIN)  tablet Take 1 tablet by mouth daily.    Yes [provider]  Omega 3-6-9 Fatty Acids (TRIPLE OMEGA-3-6-9) CAPS Take 1 capsule by mouth daily.   Yes [provider]  omeprazole (PRILOSEC) 20 MG capsule Take 20 mg by mouth 2 (two) times daily before a meal.   Yes [provider]  pantoprazole (PROTONIX) 40 MG tablet Take 40 mg by mouth 2 (two) times daily.   Yes [provider]  potassium chloride (K-DUR) 10 MEQ tablet TAKE 1 TABLET BY MOUTH ONCE DAILY Patient taking differently: Take 10 mEq by mouth daily.  09/18/17  Yes Hackney, Otila Kluver A, FNP  rosuvastatin (CRESTOR) 20 MG tablet Take 20 mg by mouth daily.   Yes [provider]  Semaglutide,0.25 or 0.5MG /DOS, (OZEMPIC, 0.25 OR 0.5 MG/DOSE,) 2 MG/1.5ML SOPN Inject 0.375 mLs into the skin once a week. 10/22/18  Yes [provider]  Tiotropium Bromide Monohydrate (SPIRIVA RESPIMAT) 1.25 MCG/ACT AERS Inhale 2 puffs into the lungs daily.   Yes [provider]  traZODone (DESYREL) 100 MG tablet Take 100 mg by mouth at bedtime.   Yes [provider]  vitamin B-12 (CYANOCOBALAMIN) 500 MCG tablet Take 500 mcg daily by mouth.   Yes [provider]  vitamin C (ASCORBIC  ACID) 500 MG tablet Take 500 mg by mouth daily.    Yes [provider]  vitamin E 400 UNIT capsule Take 400 Units by mouth daily.   Yes [provider]  albuterol (PROVENTIL HFA;VENTOLIN HFA) 108 (90 BASE) MCG/ACT inhaler Inhale 2 puffs into the lungs every 6 (six) hours as needed for wheezing or shortness of breath.    [provider]  cetirizine (ZYRTEC) 10 MG tablet Take 10 mg by mouth daily.    [provider]  ondansetron (ZOFRAN-ODT) 4 MG disintegrating tablet Take 4 mg every 8 (eight) hours as needed by mouth for nausea or vomiting.    [provider]  traMADol-acetaminophen (ULTRACET) 37.5-325 MG tablet Take 1 tablet by mouth every 6 (six) hours as needed. Takes twice a day    [provider]     VITAL SIGNS:  Blood pressure 117/81, pulse (!) 102, temperature 99.7 F (37.6 C), temperature source Oral, resp. rate 16, height 5\' 3"  (1.6 m), weight 78 kg, last menstrual period 06/20/1967, SpO2 98 %.  PHYSICAL EXAMINATION:  Physical Exam  GENERAL:  72 y.o.-year-old patient lying in the bed  EYES: Pupils equal, round, reactive to light and accommodation. No scleral icterus. Extraocular muscles intact.  HEENT: Head atraumatic, normocephalic. Oropharynx and nasopharynx clear. No oropharyngeal erythema, dry oral mucosa  NECK:  Supple, no jugular venous distention. No thyroid enlargement, no tenderness.  LUNGS: Normal breath sounds bilaterally, no wheezing, rales, rhonchi. No use of accessory muscles of respiration.  CARDIOVASCULAR: S1, S2 , tachycardia ABDOMEN: Soft, nontender, nondistended. Bowel sounds present. No organomegaly or mass.  EXTREMITIES: No pedal edema, cyanosis, or clubbing. + 2 pedal & radial pulses b/l.   NEUROLOGIC: Cranial nerves II through XII are intact. No focal Motor or sensory deficits appreciated b/l PSYCHIATRIC: The patient is alert and oriented x 3. Good affect.  SKIN: No obvious rash, lesion, or ulcer.    LABORATORY PANEL:   CBC Recent Labs  Lab 10/30/18 0301  WBC 12.0*  HGB 13.8  HCT 43.9  PLT 301   ------------------------------------------------------------------------------------------------------------------  Chemistries  Recent Labs  Lab 10/30/18 0301 10/30/18 0851  NA 140 141  K 5.5* 3.8  CL 105 109  CO2 25 24  GLUCOSE 94 94  BUN 21 23  CREATININE 0.91 1.02*  CALCIUM 10.9* 9.4  AST 26  --   ALT 18  --   ALKPHOS 35*  --   BILITOT 1.6*  --    ------------------------------------------------------------------------------------------------------------------  Cardiac Enzymes No results for input(s): TROPONINI in the last 168 hours. ------------------------------------------------------------------------------------------------------------------  RADIOLOGY:  Ct Abdomen Pelvis W Contrast  Result Date: 10/30/2018 CLINICAL DATA:  Nausea and vomiting with generalized abdominal pain EXAM: CT ABDOMEN AND PELVIS WITH CONTRAST TECHNIQUE: Multidetector CT imaging of the abdomen and pelvis was performed using the standard protocol following bolus administration of intravenous contrast. CONTRAST:  121mL OMNIPAQUE IOHEXOL 300 MG/ML  SOLN COMPARISON:  None. FINDINGS: Lower chest:  No contributory findings. Hepatobiliary: No focal liver abnormality.No evidence of biliary obstruction or stone. Pancreas: Unremarkable. Spleen: Unremarkable. Adrenals/Urinary Tract: Negative adrenals. No hydronephrosis or stone. Patchy bilateral renal cortical thinning/scarring. 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 Simple left upper pole cyst measuring 2.4 cm. No hydronephrosis or urolithiasis. Unremarkable bladder. Stomach/Bowel: Colonic and proximal small bowel diverticulosis without diverticulitis. Fluid within the distal colon without visible colitis. Appendectomy. No bowel obstruction. Vascular/Lymphatic: No acute vascular abnormality.  Atherosclerosis with prominent celiac and SMA stenosis, especially the celiac. Left renal artery atheromatous narrowings with which may account for asymmetric left renal atrophy. No mass or adenopathy. Reproductive:Hysterectomy. Other: No ascites or pneumoperitoneum. Musculoskeletal: No acute abnormalities. Partially covered lipoma within the proximal right thigh musculature, simple where covered. IMPRESSION: 1. No acute finding. 2. 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 3. Colonic and small bowel diverticulosis. 4. Renal scarring with cortical thinning asymmetric to the left, possibly chronic arterial stenosis. Electronically Signed   By: Monte Fantasia M.D.   On: 10/30/2018 05:19   Dg Chest Port 1 View  Result Date: 10/30/2018 CLINICAL DATA:  Sepsis EXAM: PORTABLE CHEST 1 VIEW COMPARISON:  09/20/2016 FINDINGS: Heart and mediastinal contours are within normal limits. No focal opacities or effusions. No acute bony abnormality. IMPRESSION: No active disease. Electronically Signed   By: Rolm Baptise M.D.   On: 10/30/2018 03:16     IMPRESSION AND PLAN:   *Gastroenteritis with sepsis present on admission.  Will rule out C. difficile.  Ordered stool PCR.  Started on antibiotics.  Wait for test results.  Fluid bolus.  Repeat lactic acid.  *Hyperkalemia.  Will repeat blood work after fluid resuscitation.  Potassium supplements.  *Diabetes mellitus.  Sliding scale insulin ordered.  Hold home diabetic medications.  *Hypertension.  Continue metoprolol.  Hold BiDil.  *COPD stable.  Inhalers/nebulizers as needed  *DVT prophylaxis with Lovenox  All the records are reviewed and case discussed with ED provider. Management plans discussed with the patient, family and they are in agreement.  CODE STATUS: FULL CODE  TOTAL TIME TAKING CARE OF THIS PATIENT: 40 minutes.   Leia Alf Tory Septer M.D on 10/30/2018 at 11:22 AM  Between 7am to 6pm - Pager - [phone]  After 6pm go to  [email or web address] - password [email or web address] [facility]  Office  [phone]  CC: Primary care physician; Tracie Harrier, MD  Note: This dictation was prepared with Dragon dictation along with smaller phrase technology. Any transcriptional errors that result from this process are unintentional.  '

## 2018-10-30 NOTE — Progress Notes (Signed)
   10/30/18 1300  Clinical Encounter Type  Visited With Patient  Visit Type Initial  Referral From Nurse  Consult/Referral To Chaplain  Stress Factors  Patient Stress Factors Health changes   Chaplain received an OR to complete or update an AD and for support at the EOL. Upon arrival, the patient was awake, alert and oriented. The patient reported that she is feeling a bit better and was able to eat some of her lunch, but is experiencing diminished appetite. The patient wonders if her GI issues ar due to insect pests in her home. This chaplain provided support in the form of compassionate ministerial presence and active and reflective listening. The patient shared that she wants to be in touch with her husband, but is unable to dial the long-distance number. This chaplain offered to be in touch with the patient's husband and share her room telephone number here at [facility].  The patient stated that she is not interested in completing the AD paperwork at this time, but she accepted the document and will review later.

## 2018-10-30 NOTE — Progress Notes (Signed)
PHARMACIST - PHYSICIAN ORDER COMMUNICATION  CONCERNING: P&T Medication Policy on Herbal Medications  DESCRIPTION:  This patient's order for:  Biotin 1 mg  has been noted.  This product(s) is classified as an "herbal" or natural product. Due to a lack of definitive safety studies or FDA approval, nonstandard manufacturing practices, plus the potential risk of unknown drug-drug interactions while on inpatient medications, the Pharmacy and Therapeutics Committee does not permit the use of "herbal" or natural products of this type within [facility].   ACTION TAKEN: The pharmacy department is unable to verify this order at this time.  Please reevaluate patient's clinical condition at discharge and address if the herbal or natural product(s) should be resumed at that time.  Pernell Dupre, PharmD, BCPS Clinical Pharmacist 10/30/2018 8:46 AM

## 2018-10-30 NOTE — ED Triage Notes (Signed)
Pt arrives to ED from home via [facility] with c/c of NVD beginning two days ago. EMS reports transport vitals of 80 systolic, p [id number], EKG shows Left bundle branch block. Temp 98.8. CBG 150. Pt given 4mg  zofran IM. Upon arrival, pt a&ox4, NAD, no respiratory Sx evident. Dr Karma Greaser at bedside.

## 2018-10-30 NOTE — Progress Notes (Addendum)
PHARMACY -  BRIEF ANTIBIOTIC NOTE   Pharmacy has received consult(s) for vancomycin/cefepime from an ED provider.  The patient's profile has been reviewed for ht/wt/allergies/indication/available labs.    One time order(s) placed for vancomycin 1.5g + cefepime 2g IV x 1  Further antibiotics/pharmacy consults should be ordered by admitting physician if indicated.                       Thank you,  Tobie Lords, PharmD, BCPS Clinical Pharmacist 10/30/2018  5:40 AM

## 2018-10-30 NOTE — ED Provider Notes (Signed)
[facility] Emergency Department Provider Note  ____________________________________________   First MD Initiated Contact with Patient 10/30/18 [phone]     (approximate)  I have reviewed the triage vital signs and the nursing notes.   HISTORY  Chief Complaint Nausea, Emesis, and Diarrhea  Level 5 caveat:  history/ROS limited by acute/critical illness  HPI Diane Patton is a 73 y.o. female who presents for evaluation of nausea, vomiting, and diarrhea with intermittent abdominal pain that started about 2 days ago.  EMS was called tonight due to her persistent symptoms.  When they arrived they said that her blood pressure was low with a systolic of 80 and a heart rate of about 110.  She was afebrile according to an oral temperature but felt febrile upon arrival to the emergency department.  She is in no apparent distress at this time, just worried about her persistent GI symptoms.  She denies contact with known COVID-19 patients.  She denies sore throat, headache, chest pain, cough, shortness of breath.  She says she had abdominal pain a few hours ago but it has improved.  Nothing in particular makes his symptoms better or worse.  She has had some chills but no known fever.  No dysuria.         Past Medical History:  Diagnosis Date  . Asthma   . CHF (congestive heart failure) ([place])   . COPD (chronic obstructive pulmonary disease) ([place])   . Diabetes mellitus, type 2 ([place])   . GERD (gastroesophageal reflux disease)   . HTN (hypertension)   . Hyperlipidemia   . Obstructive sleep apnea on CPAP   . Osteoarthritis   . Scoliosis     Patient Active Problem List   Diagnosis Date Noted  . Sleep apnea 01/19/2016  . Insomnia 03/21/2015  . Cough 11/01/2014  . Diabetes ([place]) 09/29/2014  . Chronic obstructive pulmonary disease ([place]) 09/29/2014  . Chronic systolic heart failure ([place]) 06/09/2014  . HYPERLIPIDEMIA-MIXED 12/16/2008  . Essential hypertension 12/16/2008    Past Surgical History:  Procedure Laterality Date  . ABDOMINAL HYSTERECTOMY    . ankle (other)    . APPENDECTOMY    . BREAST BIOPSY Right 1999   neg  . BREAST CYST ASPIRATION     neg  . BREAST SURGERY    . COLONOSCOPY WITH PROPOFOL N/A 07/10/2016   Procedure: COLONOSCOPY WITH PROPOFOL;  Surgeon: Lollie Sails, MD;  Location: [facility] ENDOSCOPY;  Service: Endoscopy;  Laterality: N/A;  . ear (otheR)    . ESOPHAGOGASTRODUODENOSCOPY (EGD) WITH PROPOFOL N/A 07/10/2016   Procedure: ESOPHAGOGASTRODUODENOSCOPY (EGD) WITH PROPOFOL;  Surgeon: Lollie Sails, MD;  Location: [facility] ENDOSCOPY;  Service: Endoscopy;  Laterality: N/A;  . ESOPHAGOGASTRODUODENOSCOPY (EGD) WITH PROPOFOL N/A 09/17/2017   Procedure: ESOPHAGOGASTRODUODENOSCOPY (EGD) WITH PROPOFOL;  Surgeon: Lollie Sails, MD;  Location: [facility] ENDOSCOPY;  Service: Endoscopy;  Laterality: N/A;  . feet (other)    . sinus (other)    . stomach    . UPPER ESOPHAGEAL ENDOSCOPIC ULTRASOUND (EUS) N/A 11/22/2016   Procedure: UPPER ESOPHAGEAL ENDOSCOPIC ULTRASOUND (EUS);  Surgeon: Reita Cliche, MD;  Location: [facility] ENDOSCOPY;  Service: Gastroenterology;  Laterality: N/A;    Prior to Admission medications   Medication Sig Start Date End Date Taking? Authorizing Provider  albuterol (PROVENTIL HFA;VENTOLIN HFA) 108 (90 BASE) MCG/ACT inhaler Inhale 2 puffs into the lungs every 6 (six) hours as needed for wheezing or shortness of breath.    [provider]  aspirin 81 MG tablet Take  81 mg by mouth daily.    [provider]  BIDIL 20-37.5 MG tablet TAKE 1 TABLET BY MOUTH THREE TIMES DAILY 05/20/17   Darylene Price A, FNP  Biotin 1000 MCG tablet Take 1,000 mcg by mouth daily.     [provider]  calcium carbonate (CALCIUM 600) 600 MG TABS tablet Take 600 mg by mouth 2 (two) times daily with a meal.     [provider]  cetirizine (ZYRTEC) 10 MG tablet Take 10 mg by mouth daily.    [provider]   cholecalciferol (VITAMIN D) 400 units TABS tablet Take 2,000 Units by mouth daily.     [provider]  [facility] Liver Oil w/Vit A & D CAPS Take 1 capsule by mouth daily.    [provider]  ENTRESTO 97-103 MG TAKE 1 TABLET BY MOUTH TWICE DAILY 09/18/17   Darylene Price A, FNP  ferrous gluconate (FERGON) 324 MG tablet Take 324 mg by mouth daily with breakfast.    [provider]  furosemide (LASIX) 40 MG tablet Take 40 mg by mouth daily.    [provider]  glimepiride (AMARYL) 2 MG tablet Take 2 mg by mouth daily with breakfast.    [provider]  levocetirizine (XYZAL) 5 MG tablet Take 5 mg by mouth every evening.    [provider]  meloxicam (MOBIC) 7.5 MG tablet Take 7.5 mg by mouth 2 (two) times daily as needed for pain. Takes one every other day    [provider]  metFORMIN (GLUCOPHAGE) 500 MG tablet Take 1,000 mg by mouth 2 (two) times daily with a meal.    [provider]  metoprolol (TOPROL-XL) 200 MG 24 hr tablet TAKE 1 TABLET BY MOUTH ONCE DAILY. TAKE WITH OR IMMEDIATELY FOLLOWING A MEAL. 05/20/17   Darylene Price A, FNP  montelukast (SINGULAIR) 10 MG tablet Take 10 mg by mouth at bedtime.    [provider]  Multiple Vitamin (MULTIVITAMIN) tablet Take 1 tablet by mouth daily.     [provider]  Omega 3-6-9 Fatty Acids (TRIPLE OMEGA-3-6-9) CAPS Take 1 capsule by mouth daily.    [provider]  omeprazole (PRILOSEC) 20 MG capsule Take 20 mg by mouth 2 (two) times daily before a meal.    [provider]  ondansetron (ZOFRAN-ODT) 4 MG disintegrating tablet Take 4 mg every 8 (eight) hours as needed by mouth for nausea or vomiting.    [provider]  pantoprazole (PROTONIX) 40 MG tablet Take 40 mg by mouth 2 (two) times daily.    [provider]  potassium chloride (K-DUR) 10 MEQ tablet TAKE 1 TABLET BY MOUTH ONCE DAILY 09/18/17   Darylene Price A, FNP  rosuvastatin  (CRESTOR) 20 MG tablet Take 20 mg by mouth daily.    [provider]  Tiotropium Bromide Monohydrate (SPIRIVA RESPIMAT) 1.25 MCG/ACT AERS Inhale 2 puffs into the lungs daily.    [provider]  traMADol-acetaminophen (ULTRACET) 37.5-325 MG tablet Take 1 tablet by mouth every 6 (six) hours as needed. Takes twice a day    [provider]  traZODone (DESYREL) 100 MG tablet Take 100 mg by mouth at bedtime.    [provider]  vitamin B-12 (CYANOCOBALAMIN) 500 MCG tablet Take 500 mcg daily by mouth.    [provider]  vitamin C (ASCORBIC ACID) 500 MG tablet Take 500 mg by mouth daily.     [provider]  vitamin E 400 UNIT  capsule Take 400 Units by mouth daily.    [provider]    Allergies Amoxicillin, Aspirin, Celebrex [celecoxib], Latex, Penicillins, and Allegra [fexofenadine]  Family History  Problem Relation Age of Onset  . Alzheimer's disease Other   . Heart attack Mother   . Hypertension Mother   . Cancer Brother        lung  . Cancer Maternal Aunt        mouth and breast  . Breast cancer Maternal Aunt   . Cancer Daughter        throat    Social History Social History   Tobacco Use  . Smoking status: Former Smoker    Packs/day: 1.00    Years: 15.00    Pack years: 15.00    Types: Cigarettes    Quit date: 07/21/1982    Years since quitting: 36.3  . Smokeless tobacco: Never Used  Substance Use Topics  . Alcohol use: No    Alcohol/week: 15.0 standard drinks    Types: 15 Standard drinks or equivalent per week    Comment: quit drinking 04/1981  . Drug use: No    Review of Systems Constitutional: Fever and chills (rectal temp of 101.2 upon arrival to the ED). Eyes: No visual changes. ENT: No sore throat. Cardiovascular: Denies chest pain. Respiratory: Denies shortness of breath. Gastrointestinal: Abdominal pain, nausea, vomiting, and diarrhea.  Genitourinary: Negative for dysuria. Musculoskeletal:  Negative for neck pain.  Negative for back pain. Integumentary: Negative for rash. Neurological: Negative for headaches, focal weakness or numbness.   ____________________________________________   PHYSICAL EXAM:  VITAL SIGNS: ED Triage Vitals  Enc Vitals Group     BP 10/30/18 0253 127/61     Pulse Rate 10/30/18 0253 (!) 108     Resp 10/30/18 0253 15     Temp 10/30/18 0318 (!) 101.2 F (38.4 C)     Temp Source 10/30/18 0318 Rectal     SpO2 10/30/18 0253 100 %     Weight 10/30/18 0258 77.1 kg (170 lb)     Height 10/30/18 0258 1.6 m (5\' 3" )     Head Circumference --      Peak Flow --      Pain Score 10/30/18 0258 0     Pain Loc --      Pain Edu? --      Excl. in [place]? --     Constitutional: Alert and oriented.   Eyes: Conjunctivae are normal.  Head: Atraumatic. Nose: No congestion/rhinnorhea. Mouth/Throat: Mucous membranes are dry. Neck: No stridor.  No meningeal signs.   Cardiovascular: Sinus tachycardia, regular rhythm. Good peripheral circulation. Grossly normal heart sounds. Respiratory: Normal respiratory effort.  No retractions. Gastrointestinal: Soft and nontender. No distention.  Musculoskeletal: No lower extremity tenderness nor edema. No gross deformities of extremities. Neurologic:  Normal speech and language. No gross focal neurologic deficits are appreciated.  Skin:  Skin is warm, dry and intact. Psychiatric: Mood and affect are normal. Speech and behavior are normal.  ____________________________________________   LABS (all labs ordered are listed, but only abnormal results are displayed)  Labs Reviewed  LACTIC ACID, PLASMA - Abnormal; Notable for the following components:      Result Value   Lactic Acid, Venous 2.4 (*)    All other components within normal limits  COMPREHENSIVE METABOLIC PANEL - Abnormal; Notable for the following components:   Potassium 5.5 (*)    Calcium 10.9 (*)    Alkaline Phosphatase 35 (*)    Total  Bilirubin 1.6 (*)    All  other components within normal limits  LIPASE, BLOOD - Abnormal; Notable for the following components:   Lipase 53 (*)    All other components within normal limits  BRAIN NATRIURETIC PEPTIDE - Abnormal; Notable for the following components:   B Natriuretic Peptide 169.0 (*)    All other components within normal limits  CBC WITH DIFFERENTIAL/PLATELET - Abnormal; Notable for the following components:   WBC 12.0 (*)    nRBC 0.3 (*)    Neutro Abs 8.6 (*)    All other components within normal limits  URINALYSIS, COMPLETE (UACMP) WITH MICROSCOPIC - Abnormal; Notable for the following components:   Color, Urine YELLOW (*)    APPearance CLEAR (*)    Specific Gravity, Urine 1.040 (*)    All other components within normal limits  SARS CORONAVIRUS 2 (HOSPITAL ORDER, [place] LAB)  URINE CULTURE  CULTURE, BLOOD (ROUTINE X 2)  CULTURE, BLOOD (ROUTINE X 2)  GASTROINTESTINAL PANEL BY PCR, STOOL (REPLACES STOOL CULTURE)  C DIFFICILE QUICK SCREEN W PCR REFLEX  PROCALCITONIN  PROTIME-INR  LACTIC ACID, PLASMA   ____________________________________________  EKG  ED ECG REPORT I, Hinda Kehr, the attending physician, personally viewed and interpreted this ECG.  Date: 10/30/2018 EKG Time: 2:54 AM Rate: 108 Rhythm: Sinus tachycardia QRS Axis: normal Intervals: Left bundle branch block, pre-existing ST/T Wave abnormalities: Non-specific ST segment / T-wave changes, but no clear evidence of acute ischemia. Narrative Interpretation: no definitive evidence of acute ischemia; does not meet STEMI criteria.   ____________________________________________  RADIOLOGY I, Hinda Kehr, personally viewed and evaluated these images (plain radiographs) as part of my medical decision making, as well as reviewing the written report by the radiologist.  ED MD interpretation: No acute abnormality identified on chest x-ray.  Official radiology report(s): Ct Abdomen Pelvis W  Contrast  Result Date: 10/30/2018 CLINICAL DATA:  Nausea and vomiting with generalized abdominal pain EXAM: CT ABDOMEN AND PELVIS WITH CONTRAST TECHNIQUE: Multidetector CT imaging of the abdomen and pelvis was performed using the standard protocol following bolus administration of intravenous contrast. CONTRAST:  155mL OMNIPAQUE IOHEXOL 300 MG/ML  SOLN COMPARISON:  None. FINDINGS: Lower chest:  No contributory findings. Hepatobiliary: No focal liver abnormality.No evidence of biliary obstruction or stone. Pancreas: Unremarkable. Spleen: Unremarkable. Adrenals/Urinary Tract: Negative adrenals. No hydronephrosis or stone. Patchy bilateral renal cortical thinning/scarring. 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 Simple left upper pole cyst measuring 2.4 cm. No hydronephrosis or urolithiasis. Unremarkable bladder. Stomach/Bowel: Colonic and proximal small bowel diverticulosis without diverticulitis. Fluid within the distal colon without visible colitis. Appendectomy. No bowel obstruction. Vascular/Lymphatic: No acute vascular abnormality. Atherosclerosis with prominent celiac and SMA stenosis, especially the celiac. Left renal artery atheromatous narrowings with which may account for asymmetric left renal atrophy. No mass or adenopathy. Reproductive:Hysterectomy. Other: No ascites or pneumoperitoneum. Musculoskeletal: No acute abnormalities. Partially covered lipoma within the proximal right thigh musculature, simple where covered. IMPRESSION: 1. No acute finding. 2. 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 3. Colonic and small bowel diverticulosis. 4. Renal scarring with cortical thinning asymmetric to the left, possibly chronic arterial stenosis. Electronically Signed   By: Monte Fantasia M.D.   On: 10/30/2018 05:19   Dg Chest Port 1 View  Result Date: 10/30/2018 CLINICAL DATA:  Sepsis EXAM: PORTABLE CHEST 1 VIEW COMPARISON:  09/20/2016  FINDINGS: Heart and mediastinal contours are within normal limits. No focal opacities or  effusions. No acute bony abnormality. IMPRESSION: No active disease. Electronically Signed   By: Rolm Baptise M.D.   On: 10/30/2018 03:16    ____________________________________________   PROCEDURES   Procedure(s) performed (including Critical Care):  .Critical Care Performed by: Hinda Kehr, MD Authorized by: Hinda Kehr, MD   Critical care provider statement:    Critical care time (minutes):  45   Critical care time was exclusive of:  Separately billable procedures and treating other patients   Critical care was necessary to treat or prevent imminent or life-threatening deterioration of the following conditions:  Sepsis   Critical care was time spent personally by me on the following activities:  Development of treatment plan with patient or surrogate, discussions with consultants, evaluation of patient's response to treatment, examination of patient, obtaining history from patient or surrogate, ordering and performing treatments and interventions, ordering and review of laboratory studies, ordering and review of radiographic studies, pulse oximetry, re-evaluation of patient's condition and review of old charts     ____________________________________________   [place] / MDM / [place] / ED COURSE  As part of my medical decision making, I reviewed the following data within the [place] notes reviewed and incorporated, Labs reviewed , EKG interpreted  Old EKG reviewed, Old chart reviewed, Radiograph reviewed , Discussed with admitting physician  and Notes from prior ED visits   Differential diagnosis includes, but is not limited to, sepsis, urinary tract infection, pneumonia, intra-abdominal infection, nonspecific viral infection, infectious diarrhea from viral or bacterial source.  The patient meets sepsis criteria based on a rectal  temperature of 101.2 and a heart rate of greater than 100.  I initiated code sepsis including empiric antibiotics of cefepime 2 g IV, vancomycin 1 g IV, and Flagyl 500 mg IV as per the unknown source of sepsis goal in the system.  I doubt severe sepsis and I have ordered 1 L normal saline anticipating that she will not require the full 30 mL/kg fluid goal.  She is normotensive and stable.  No respiratory symptoms at this time the chest x-ray is pending.  No significant changes on EKG compared to prior.  I will order a CT scan of the abdomen and pelvis given the GI nature of her symptoms once we know that her kidney function is appropriate.      Clinical Course as of Oct 29 598  Thu Oct 30, 2018  [id number] Procalcitonin: <0.10 [CF]  0457 Lactic Acid, Venous(!!): 2.4 [CF]  0507 SARS Coronavirus 2: NEGATIVE [CF]  0507 Lipase(!): 53 [CF]  0511 No evidence of acute infection on chest x-ray.  DG Chest Port 1 View [CF]  [phone] Comprehensive metabolic panel is reassuring.  She does have a potassium of 5.5 which is surprising in the setting of vomiting and diarrhea but it is likely reflective of volume depletion.  Lipase is slightly elevated at 53.  CT scan is pending.  Comprehensive metabolic panel(!) [CF]  0000000 CT unremarkable.  No diarrhea since arrival.  We will check stool specimens if she is able to provide a sample.  Ordering a second liter of fluid.  Texted hospitalist for admission.   [CF]  0559 Dr. Sidney Ace acknowledged the admission   [CF]  0600 No evidence of infection on UA  Urinalysis, Complete w Microscopic(!) [CF]    Clinical Course User Index [CF] Hinda Kehr, MD     ____________________________________________  FINAL CLINICAL IMPRESSION(S) / ED DIAGNOSES  Final diagnoses:  Sepsis, due  to unspecified organism, unspecified whether acute organ dysfunction present (HCC)  Nausea vomiting and diarrhea  Generalized abdominal pain  Fever, unspecified fever cause     MEDICATIONS GIVEN  DURING THIS VISIT:  Medications  ceFEPIme (MAXIPIME) 2 g in sodium chloride 0.9 % 100 mL IVPB (has no administration in time range)  metroNIDAZOLE (FLAGYL) IVPB 500 mg (has no administration in time range)  sodium chloride 0.9 % bolus 1,000 mL (has no administration in time range)  sodium chloride 0.9 % bolus 500 mL (0 mLs Intravenous Stopped 10/30/18 0456)  vancomycin (VANCOCIN) 1,500 mg in sodium chloride 0.9 % 500 mL IVPB (1,500 mg Intravenous New Bag/Given 10/30/18 0400)  iohexol (OMNIPAQUE) 300 MG/ML solution 100 mL (100 mLs Intravenous Contrast Given 10/30/18 0411)     ED Discharge Orders    None      *Please note:  Lavilla Steinbrunner was evaluated in Emergency Department on 10/30/2018 for the symptoms described in the history of present illness. She was evaluated in the context of the global COVID-19 pandemic, which necessitated consideration that the patient might be at risk for infection with the SARS-CoV-2 virus that causes COVID-19. Institutional protocols and algorithms that pertain to the evaluation of patients at risk for COVID-19 are in a state of rapid change based on information released by regulatory bodies including the CDC and federal and state organizations. These policies and algorithms were followed during the patient's care in the ED.  Some ED evaluations and interventions may be delayed as a result of limited staffing during the pandemic.*  Note:  This document was prepared using Dragon voice recognition software and may include unintentional dictation errors.   Hinda Kehr, MD 10/30/18 0600

## 2018-10-31 DIAGNOSIS — K529 Noninfective gastroenteritis and colitis, unspecified: Secondary | ICD-10-CM | POA: Diagnosis present

## 2018-10-31 LAB — CBC
HCT: 30 % — ABNORMAL LOW (ref 36.0–46.0)
Hemoglobin: 9.5 g/dL — ABNORMAL LOW (ref 12.0–15.0)
MCH: 31.1 pg (ref 26.0–34.0)
MCHC: 31.7 g/dL (ref 30.0–36.0)
MCV: 98.4 fL (ref 80.0–100.0)
Platelets: 200 10*3/uL (ref 150–400)
RBC: 3.05 MIL/uL — ABNORMAL LOW (ref 3.87–5.11)
RDW: 13.2 % (ref 11.5–15.5)
WBC: 8.4 10*3/uL (ref 4.0–10.5)
nRBC: 0 % (ref 0.0–0.2)

## 2018-10-31 LAB — BASIC METABOLIC PANEL
Anion gap: 6 (ref 5–15)
BUN: 17 mg/dL (ref 8–23)
CO2: 24 mmol/L (ref 22–32)
Calcium: 8.6 mg/dL — ABNORMAL LOW (ref 8.9–10.3)
Chloride: 112 mmol/L — ABNORMAL HIGH (ref 98–111)
Creatinine, Ser: 0.87 mg/dL (ref 0.44–1.00)
GFR calc Af Amer: >60 mL/min (ref >60)
GFR calc non Af Amer: >60 mL/min (ref >60)
Glucose, Bld: 82 mg/dL (ref 70–99)
Potassium: 3.7 mmol/L (ref 3.5–5.1)
Sodium: 142 mmol/L (ref 135–145)

## 2018-10-31 LAB — URINE CULTURE: Culture: NO GROWTH

## 2018-10-31 LAB — GLUCOSE, CAPILLARY: Glucose-Capillary: 83 mg/dL (ref 70–99)

## 2018-10-31 LAB — HEMOGLOBIN A1C
Hgb A1c MFr Bld: 6 % — ABNORMAL HIGH (ref 4.8–5.6)
Mean Plasma Glucose: 126 mg/dL

## 2018-10-31 NOTE — Care Management CC44 (Signed)
Condition Code 44 Documentation Completed  Patient Details  Name: Monecia Olvera MRN: [id number] Date of Birth: [birth year]   Condition Code 44 given:  Yes Patient signature on Condition Code 44 notice:  Yes Documentation of 2 MD's agreement:  Yes Code 44 added to claim:  Yes    Shelbie Hutching, RN 10/31/2018, 11:07 AM

## 2018-10-31 NOTE — Care Management Obs Status (Signed)
[place] NOTIFICATION   Patient Details  Name: Diane Patton MRN: [id number] Date of Birth: Jun 27, 1945   Medicare Observation Status Notification Given:  Yes    Shelbie Hutching, RN 10/31/2018, 11:07 AM

## 2018-10-31 NOTE — Progress Notes (Signed)
Pt being discharged home, discharge instructions reviewed with pt, states understanding, pt with no complaints 

## 2018-11-04 LAB — CULTURE, BLOOD (ROUTINE X 2)
Culture: NO GROWTH
Culture: NO GROWTH
Special Requests: ADEQUATE

## 2018-11-18 NOTE — Discharge Summary (Signed)
[facility] [place] NAME: Diane Patton    MR#:  [id number]  DATE OF BIRTH:  [birth year]  DATE OF ADMISSION:  10/30/2018 ADMITTING PHYSICIAN: Christel Mormon, MD  DATE OF DISCHARGE: 10/31/2018 11:35 AM  PRIMARY CARE PHYSICIAN: Tracie Harrier, MD   ADMISSION DIAGNOSIS:  Generalized abdominal pain [R10.84] Nausea vomiting and diarrhea [R11.2, R19.7] Fever, unspecified fever cause [R50.9] Sepsis, due to unspecified organism, unspecified whether acute organ dysfunction present ([place]) [A41.9]  DISCHARGE DIAGNOSIS:  Active Problems:   Acute gastroenteritis   Gastroenteritis   SECONDARY DIAGNOSIS:   Past Medical History:  Diagnosis Date  . Asthma   . CHF (congestive heart failure) ([place])   . COPD (chronic obstructive pulmonary disease) ([place])   . Diabetes mellitus, type 2 ([place])   . GERD (gastroesophageal reflux disease)   . HTN (hypertension)   . Hyperlipidemia   . Obstructive sleep apnea on CPAP   . Osteoarthritis   . Scoliosis      ADMITTING HISTORY  HISTORY OF PRESENT ILLNESS:  Diane Patton  is a 73 y.o. female with a known history of hypertension, diabetes mellitus, COPD, diastolic CHF, hyperlipidemia presents to the emergency room complaining of 2 days of nausea, vomiting, diarrhea.  She does have intermittent diarrhea that she attributes to iron supplements.  This time the diarrhea has been severe along with abdominal pain.  She was found to be febrile, tachycardic and Tasso count of 12.  Lactic acid elevated at 2.4.  Patient is being admitted with gastroenteritis and sepsis. No recent antibiotic use.  No recent hospital stay.   HOSPITAL COURSE:   *Gastroenteritis with sepsis present on admission.   C. difficile and stool PCR ordered but patient had no diarrhea in the hospital.  No further abdominal pain or fever.  Initially started on IV antibiotics but due to quick resolution I suspect this is viral.  Antibiotics stopped. CT scan of  the abdomen and pelvis was checked and showed nothing acute. Patient attributes intermittent gastroenteritis symptoms secondary to iron.  Advised to hold and follow-up with her primary care physician.  *Hyperkalemia.    Potassium supplements held.. Improved with fluid resuscitation  *Diabetes mellitus.  Sliding scale insulin ordered.  Hold home diabetic medications.  Resumed on discharge  *Hypertension.  Continue metoprolol.  Hold BiDil. No change in blood pressure medications after discharge  *COPD stable.  Inhalers/nebulizers as needed  Patient discharged home in stable condition with advised to return if symptoms recur.  Follow-up with PCP in a week.  CONSULTS OBTAINED:    DRUG ALLERGIES:   Allergies  Allergen Reactions  . Amoxicillin Other (See Comments)    GI distress  . Aspirin Diarrhea and Nausea And Vomiting  . Celebrex [Celecoxib] Nausea Only  . Latex Itching and Swelling  . Penicillins Other (See Comments)    GI distress  . Allegra [Fexofenadine] Cough    DISCHARGE MEDICATIONS:   Allergies as of 10/31/2018      Reactions   Amoxicillin Other (See Comments)   GI distress   Aspirin Diarrhea, Nausea And Vomiting   Celebrex [celecoxib] Nausea Only   Latex Itching, Swelling   Penicillins Other (See Comments)   GI distress   Allegra [fexofenadine] Cough      Medication List    TAKE these medications   albuterol 108 (90 Base) MCG/ACT inhaler Commonly known as: VENTOLIN HFA Inhale 2 puffs into the lungs every 6 (six) hours as needed for wheezing or shortness  of breath.   aspirin 81 MG tablet Take 81 mg by mouth daily.   BiDil 20-37.5 MG tablet Generic drug: isosorbide-hydrALAZINE TAKE 1 TABLET BY MOUTH THREE TIMES DAILY   Biotin 1000 MCG tablet Take 1,000 mcg by mouth daily.   Calcium 600 600 MG Tabs tablet Generic drug: calcium carbonate Take 600 mg by mouth 2 (two) times daily with a meal.   cetirizine 10 MG tablet Commonly known as:  ZYRTEC Take 10 mg by mouth daily.   cholecalciferol 10 MCG (400 UNIT) Tabs tablet Commonly known as: VITAMIN D3 Take 2,000 Units by mouth daily.   Cod Liver Oil w/Vit A & D Caps Take 1 capsule by mouth daily.   Entresto 97-103 MG Generic drug: sacubitril-valsartan TAKE 1 TABLET BY MOUTH TWICE DAILY   ferrous gluconate 324 MG tablet Commonly known as: FERGON Take 324 mg by mouth daily with breakfast.   furosemide 40 MG tablet Commonly known as: LASIX Take 40 mg by mouth daily.   glimepiride 2 MG tablet Commonly known as: AMARYL Take 2 mg by mouth daily with breakfast.   levocetirizine 5 MG tablet Commonly known as: XYZAL Take 5 mg by mouth every evening.   meloxicam 7.5 MG tablet Commonly known as: MOBIC Take 7.5 mg by mouth 2 (two) times daily as needed for pain. Takes one every other day   metFORMIN 500 MG tablet Commonly known as: GLUCOPHAGE Take 1,000 mg by mouth 2 (two) times daily with a meal.   metoprolol 200 MG 24 hr tablet Commonly known as: TOPROL-XL TAKE 1 TABLET BY MOUTH ONCE DAILY. TAKE WITH OR IMMEDIATELY FOLLOWING A MEAL. What changed: See the new instructions.   montelukast 10 MG tablet Commonly known as: SINGULAIR Take 10 mg by mouth at bedtime.   multivitamin tablet Take 1 tablet by mouth daily.   omeprazole 20 MG capsule Commonly known as: PRILOSEC Take 20 mg by mouth 2 (two) times daily before a meal.   ondansetron 4 MG disintegrating tablet Commonly known as: ZOFRAN-ODT Take 4 mg every 8 (eight) hours as needed by mouth for nausea or vomiting.   Ozempic (0.25 or 0.5 MG/DOSE) 2 MG/1.5ML Sopn Generic drug: Semaglutide(0.25 or 0.5MG /DOS) Inject 0.375 mLs into the skin once a week.   pantoprazole 40 MG tablet Commonly known as: PROTONIX Take 40 mg by mouth 2 (two) times daily.   potassium chloride 10 MEQ tablet Commonly known as: KLOR-CON TAKE 1 TABLET BY MOUTH ONCE DAILY   rosuvastatin 20 MG tablet Commonly known as:  CRESTOR Take 20 mg by mouth daily.   Spiriva Respimat 1.25 MCG/ACT Aers Generic drug: Tiotropium Bromide Monohydrate Inhale 2 puffs into the lungs daily.   traMADol-acetaminophen 37.5-325 MG tablet Commonly known as: ULTRACET Take 1 tablet by mouth every 6 (six) hours as needed. Takes twice a day   traZODone 100 MG tablet Commonly known as: DESYREL Take 100 mg by mouth at bedtime.   Triple Omega-3-6-9 Caps Take 1 capsule by mouth daily.   vitamin B-12 500 MCG tablet Commonly known as: CYANOCOBALAMIN Take 500 mcg daily by mouth.   vitamin C 500 MG tablet Commonly known as: ASCORBIC ACID Take 500 mg by mouth daily.   vitamin E 400 UNIT capsule Take 400 Units by mouth daily.       Today   VITAL SIGNS:  Blood pressure (!) 126/59, pulse 92, temperature 99 F (37.2 C), temperature source Oral, resp. rate 16, height 5\' 3"  (1.6 m), weight 78 kg, last menstrual period  06/20/1967, SpO2 95 %.  I/O:  No intake or output data in the 24 hours ending 11/18/18 2334  PHYSICAL EXAMINATION:  Physical Exam  GENERAL:  73 y.o.-year-old patient lying in the bed with no acute distress.  LUNGS: Normal breath sounds bilaterally, no wheezing, rales,rhonchi or crepitation. No use of accessory muscles of respiration.  CARDIOVASCULAR: S1, S2 normal. No murmurs, rubs, or gallops.  ABDOMEN: Soft, non-tender, non-distended. Bowel sounds present. No organomegaly or mass.  NEUROLOGIC: Moves all 4 extremities. PSYCHIATRIC: The patient is alert and oriented x 3.  SKIN: No obvious rash, lesion, or ulcer.   DATA REVIEW:   CBC No results for input(s): WBC, HGB, HCT, PLT in the last 168 hours.  Chemistries  No results for input(s): NA, K, CL, CO2, GLUCOSE, BUN, CREATININE, CALCIUM, MG, AST, ALT, ALKPHOS, BILITOT in the last 168 hours.  Invalid input(s): GFRCGP  Cardiac Enzymes No results for input(s): TROPONINI in the last 168 hours.  Microbiology Results  Results for orders placed or  performed during the hospital encounter of 10/30/18  Blood Culture (routine x 2)     Status: None   Collection Time: 10/30/18  3:01 AM   Specimen: BLOOD  Result Value Ref Range Status   Specimen Description BLOOD LEFT WRIST  Final   Special Requests   Final    BOTTLES DRAWN AEROBIC AND ANAEROBIC Blood Culture adequate volume   Culture   Final    NO GROWTH 5 DAYS Performed at [facility], [address], [place], [place] [address]    Report Status 11/04/2018 FINAL  Final  SARS Coronavirus 2 [facility] order, Performed in [facility] hospital lab) Nasopharyngeal Nasopharyngeal Swab     Status: None   Collection Time: 10/30/18  3:01 AM   Specimen: Nasopharyngeal Swab  Result Value Ref Range Status   SARS Coronavirus 2 NEGATIVE NEGATIVE Final    Comment: (NOTE) If result is NEGATIVE SARS-CoV-2 target nucleic acids are NOT DETECTED. The SARS-CoV-2 RNA is generally detectable in upper and lower  respiratory specimens during the acute phase of infection. The lowest  concentration of SARS-CoV-2 viral copies this assay can detect is 250  copies / mL. A negative result does not preclude SARS-CoV-2 infection  and should not be used as the sole basis for treatment or other  patient management decisions.  A negative result may occur with  improper specimen collection / handling, submission of specimen other  than nasopharyngeal swab, presence of viral mutation(s) within the  areas targeted by this assay, and inadequate number of viral copies  (<250 copies / mL). A negative result must be combined with clinical  observations, patient history, and epidemiological information. If result is POSITIVE SARS-CoV-2 target nucleic acids are DETECTED. The SARS-CoV-2 RNA is generally detectable in upper and lower  respiratory specimens dur ing the acute phase of infection.  Positive  results are indicative of active infection with SARS-CoV-2.  Clinical  correlation with patient history and  other diagnostic information is  necessary to determine patient infection status.  Positive results do  not rule out bacterial infection or co-infection with other viruses. If result is PRESUMPTIVE POSTIVE SARS-CoV-2 nucleic acids MAY BE PRESENT.   A presumptive positive result was obtained on the submitted specimen  and confirmed on repeat testing.  While 2019 novel coronavirus  (SARS-CoV-2) nucleic acids may be present in the submitted sample  additional confirmatory testing may be necessary for epidemiological  and / or clinical management purposes  to differentiate between  SARS-CoV-2 and other Sarbecovirus currently known to infect humans.  If clinically indicated additional testing with an alternate test  methodology [phone] is advised. The SARS-CoV-2 RNA is generally  detectable in upper and lower respiratory sp ecimens during the acute  phase of infection. The expected result is Negative. Fact Sheet for Patients:  StrictlyIdeas.no Fact Sheet for Healthcare Providers: BankingDealers.co.za This test is not yet approved or cleared by the [place] FDA and has been authorized for detection and/or diagnosis of SARS-CoV-2 by FDA under an Emergency Use Authorization (EUA).  This EUA will remain in effect (meaning this test can be used) for the duration of the COVID-19 declaration under Section 564(b)(1) of the Act, 21 U.S.C. section 360bbb-3(b)(1), unless the authorization is terminated or revoked sooner. Performed at [facility], [address] [place], [place] [address]   Blood Culture (routine x 2)     Status: None   Collection Time: 10/30/18  3:54 AM   Specimen: BLOOD  Result Value Ref Range Status   Specimen Description BLOOD LEFT AC  Final   Special Requests   Final    BOTTLES DRAWN AEROBIC AND ANAEROBIC Blood Culture results may not be optimal due to an excessive volume of blood received in culture bottles    Culture   Final    NO GROWTH 5 DAYS Performed at [facility], [address], [place], [place] [address]    Report Status 11/04/2018 FINAL  Final  Urine culture     Status: None   Collection Time: 10/30/18  4:52 AM   Specimen: In/Out Cath Urine  Result Value Ref Range Status   Specimen Description   Final    IN/OUT CATH URINE Performed at [facility], [address] [place], [place] [address]    Special Requests   Final    NONE Performed at [facility], [address], [place], [place] [address]    Culture   Final    NO GROWTH Performed at [facility], [place] [address] [place], [place] [address]    Report Status 10/31/2018 FINAL  Final    RADIOLOGY:  No results found.  Follow up with PCP in 1 week.  Management plans discussed with the patient, family and they are in agreement.  CODE STATUS:  Code Status History    Date Active Date Inactive Code Status Order ID Comments User Context   10/30/2018 0703 10/31/2018 1435 Full Code [id number]  Mansy, Arvella Merles, MD ED   Advance Care Planning Activity      TOTAL TIME TAKING CARE OF THIS PATIENT ON DAY OF DISCHARGE: more than 30 minutes.   Leia Alf Yazleemar Strassner M.D on 11/18/2018 at 11:34 PM  Between 7am to 6pm - Pager - [phone]  After 6pm go to [email or web address] - password [email or web address] [facility]  Office  [phone]  CC: Primary care physician; Tracie Harrier, MD  Note: This dictation was prepared with Dragon dictation along with smaller phrase technology. Any transcriptional errors that result from this process are unintentional.

## 2018-12-17 ENCOUNTER — Ambulatory Visit
Admission: RE | Admit: 2018-12-17 | Discharge: 2018-12-17 | Disposition: A | Discharge status: Home / Self Care | Payer: Medicare Other | Source: Ambulatory Visit | Attending: Internal Medicine | Admitting: Internal Medicine

## 2018-12-17 DIAGNOSIS — Z1231 Encounter for screening mammogram for malignant neoplasm of breast: Secondary | ICD-10-CM | POA: Diagnosis not present

## 2019-05-07 ENCOUNTER — Other Ambulatory Visit
Admission: RE | Admit: 2019-05-07 | Discharge: 2019-05-07 | Disposition: A | Discharge status: Home / Self Care | Payer: Medicare Other | Source: Ambulatory Visit | Attending: Internal Medicine | Admitting: Internal Medicine

## 2019-05-07 DIAGNOSIS — Z20822 Contact with and (suspected) exposure to covid-19: Secondary | ICD-10-CM | POA: Diagnosis not present

## 2019-05-07 DIAGNOSIS — Z01812 Encounter for preprocedural laboratory examination: Secondary | ICD-10-CM | POA: Diagnosis present

## 2019-05-07 LAB — SARS CORONAVIRUS 2 (TAT 6-24 HRS): SARS Coronavirus 2: NEGATIVE

## 2019-05-08 ENCOUNTER — Encounter: Payer: Self-pay | Admitting: Internal Medicine

## 2019-05-11 ENCOUNTER — Encounter: Payer: Self-pay | Admitting: Internal Medicine

## 2019-05-11 ENCOUNTER — Ambulatory Visit
Admission: RE | Admit: 2019-05-11 | Discharge: 2019-05-11 | Disposition: A | Discharge status: Home / Self Care | Payer: Medicare Other | Attending: Internal Medicine | Admitting: Internal Medicine

## 2019-05-11 ENCOUNTER — Other Ambulatory Visit: Payer: Self-pay

## 2019-05-11 ENCOUNTER — Encounter
Admission: RE | Disposition: A | Discharge status: Home / Self Care | Payer: Self-pay | Source: Home / Self Care | Attending: Internal Medicine

## 2019-05-11 ENCOUNTER — Ambulatory Visit: Payer: Medicare Other | Admitting: Anesthesiology

## 2019-05-11 DIAGNOSIS — I509 Heart failure, unspecified: Secondary | ICD-10-CM | POA: Diagnosis not present

## 2019-05-11 DIAGNOSIS — Z791 Long term (current) use of non-steroidal anti-inflammatories (NSAID): Secondary | ICD-10-CM | POA: Insufficient documentation

## 2019-05-11 DIAGNOSIS — Z7984 Long term (current) use of oral hypoglycemic drugs: Secondary | ICD-10-CM | POA: Insufficient documentation

## 2019-05-11 DIAGNOSIS — J449 Chronic obstructive pulmonary disease, unspecified: Secondary | ICD-10-CM | POA: Diagnosis not present

## 2019-05-11 DIAGNOSIS — E785 Hyperlipidemia, unspecified: Secondary | ICD-10-CM | POA: Diagnosis not present

## 2019-05-11 DIAGNOSIS — G4733 Obstructive sleep apnea (adult) (pediatric): Secondary | ICD-10-CM | POA: Insufficient documentation

## 2019-05-11 DIAGNOSIS — D509 Iron deficiency anemia, unspecified: Secondary | ICD-10-CM | POA: Insufficient documentation

## 2019-05-11 DIAGNOSIS — E119 Type 2 diabetes mellitus without complications: Secondary | ICD-10-CM | POA: Insufficient documentation

## 2019-05-11 DIAGNOSIS — Z7951 Long term (current) use of inhaled steroids: Secondary | ICD-10-CM | POA: Insufficient documentation

## 2019-05-11 DIAGNOSIS — I110 Hypertensive heart disease with heart failure: Secondary | ICD-10-CM | POA: Diagnosis not present

## 2019-05-11 DIAGNOSIS — Z79899 Other long term (current) drug therapy: Secondary | ICD-10-CM | POA: Insufficient documentation

## 2019-05-11 DIAGNOSIS — M170 Bilateral primary osteoarthritis of knee: Secondary | ICD-10-CM | POA: Diagnosis not present

## 2019-05-11 DIAGNOSIS — Z8711 Personal history of peptic ulcer disease: Secondary | ICD-10-CM | POA: Insufficient documentation

## 2019-05-11 DIAGNOSIS — M419 Scoliosis, unspecified: Secondary | ICD-10-CM | POA: Diagnosis not present

## 2019-05-11 DIAGNOSIS — Z7982 Long term (current) use of aspirin: Secondary | ICD-10-CM | POA: Diagnosis not present

## 2019-05-11 DIAGNOSIS — K219 Gastro-esophageal reflux disease without esophagitis: Secondary | ICD-10-CM | POA: Diagnosis not present

## 2019-05-11 DIAGNOSIS — K2950 Unspecified chronic gastritis without bleeding: Secondary | ICD-10-CM | POA: Diagnosis not present

## 2019-05-11 HISTORY — DX: Other cardiomyopathies: I42.8

## 2019-05-11 HISTORY — DX: Gastric ulcer, unspecified as acute or chronic, without hemorrhage or perforation: K25.9

## 2019-05-11 HISTORY — PX: ESOPHAGOGASTRODUODENOSCOPY: SHX5428

## 2019-05-11 LAB — GLUCOSE, CAPILLARY: Glucose-Capillary: 94 mg/dL (ref 70–99)

## 2019-05-11 SURGERY — EGD (ESOPHAGOGASTRODUODENOSCOPY)
Anesthesia: General

## 2019-05-11 MED ORDER — LIDOCAINE HCL (CARDIAC) PF 100 MG/5ML IV SOSY
PREFILLED_SYRINGE | INTRAVENOUS | Status: DC | PRN
  Administered 2019-05-11: 50 mg via INTRAVENOUS

## 2019-05-11 MED ORDER — PROPOFOL 10 MG/ML IV BOLUS
INTRAVENOUS | Status: DC | PRN
  Administered 2019-05-11: 40 mg via INTRAVENOUS

## 2019-05-11 MED ORDER — SODIUM CHLORIDE 0.9 % IV SOLN
INTRAVENOUS | Status: DC
  Administered 2019-05-11: 1000 mL via INTRAVENOUS

## 2019-05-11 MED ORDER — PROPOFOL 500 MG/50ML IV EMUL
INTRAVENOUS | Status: DC | PRN
  Administered 2019-05-11: 150 ug/kg/min via INTRAVENOUS

## 2019-05-11 NOTE — Anesthesia Preprocedure Evaluation (Signed)
Anesthesia Evaluation  Patient identified by MRN, date of birth, ID band Patient awake    Reviewed: Allergy & Precautions, NPO status , Patient's Chart, lab work & pertinent test results  History of Anesthesia Complications Negative for: history of anesthetic complications  Airway Mallampati: II  TM Distance: >3 FB Neck ROM: Full    Dental  (+) Edentulous Lower, Edentulous Upper   Pulmonary neg pulmonary ROS, neg sleep apnea, neg COPD, Patient abstained from smoking.Not current smoker, former smoker,    breath sounds clear to auscultation- rhonchi (-) wheezing      Cardiovascular Exercise Tolerance: Good METShypertension, Pt. on medications +CHF  (-) CAD, (-) Past MI, (-) Cardiac Stents and (-) CABG (-) dysrhythmias  Rhythm:Regular Rate:Normal - Systolic murmurs and - Diastolic murmurs Echo [id number]: MILD LV SYSTOLIC DYSFUNCTION (See above) WITH MODERATE LVH NORMAL RIGHT VENTRICULAR SYSTOLIC FUNCTION MILD VALVULAR REGURGITATION (See above) NO VALVULAR STENOSIS MILD to MODERATE TR MILD to MODERATE PHTN EF 40%   Neuro/Psych negative neurological ROS  negative psych ROS   GI/Hepatic Neg liver ROS, neg GERD  ,(+)     (-) substance abuse  ,   Endo/Other  diabetes, Oral Hypoglycemic Agents  Renal/GU negative Renal ROS     Musculoskeletal  (+) Arthritis ,   Abdominal (+) + obese,   Peds  Hematology negative hematology ROS (+)   Anesthesia Other Findings Past Medical History: No date: Asthma No date: Cardiomyopathy, nonischemic (HCC) No date: CHF (congestive heart failure) (H[facility]) No date: COPD (chronic obstructive pulmonary disease) (H[facility]) No date: Diabetes mellitus, type 2 (H[facility]) No date: Gastric ulcer without hemorrhage or perforation No date: GERD (gastroesophageal reflux disease) No date: HTN (hypertension) No date: Hyperlipidemia No date: IDA (iron deficiency anemia) No date: Obstructive sleep apnea on  CPAP No date: Osteoarthritis     Comment:  osteo of both knees No date: Scoliosis  Reproductive/Obstetrics                             Anesthesia Physical  Anesthesia Plan  ASA: III  Anesthesia Plan: General   Post-op Pain Management:    Induction: Intravenous  PONV Risk Score and Plan: 2 and Propofol infusion  Airway Management Planned: Natural Airway  Additional Equipment: None  Intra-op Plan:   Post-operative Plan:   Informed Consent: I have reviewed the patients History and Physical, chart, labs and discussed the procedure including the risks, benefits and alternatives for the proposed anesthesia with the patient or authorized representative who has indicated his/her understanding and acceptance.     Dental advisory given  Plan Discussed with: CRNA and Anesthesiologist  Anesthesia Plan Comments: (Discussed risks of anesthesia with patient, including possibility of difficulty with spontaneous ventilation under anesthesia necessitating airway intervention, PONV, and rare risks such as cardiac or respiratory or neurological events. Patient understands.)        Anesthesia Quick Evaluation

## 2019-05-11 NOTE — Op Note (Signed)
[facility] Gastroenterology Patient Name: Diane Patton Procedure Date: 05/11/2019 8:56 AM MRN: [id number] Account #: [id number] Date of Birth: 1946/01/14 Admit Type: Outpatient Age: 74 Room: [facility] ENDO ROOM 3 Gender: Female Note Status: Finalized Procedure:             Upper GI endoscopy Indications:           Unexplained iron deficiency anemia, Follow-up of                         chronic gastric ulcer Providers:             Benay Pike. Anarie Kalish MD, MD Medicines:             Propofol per Anesthesia Complications:         No immediate complications. Estimated blood loss:                         Minimal. Procedure:             Pre-Anesthesia Assessment:                        - The risks and benefits of the procedure and the                         sedation options and risks were discussed with the                         patient. All questions were answered and informed                         consent was obtained.                        - Patient identification and proposed procedure were                         verified prior to the procedure by the nurse. The                         procedure was verified in the procedure room.                        - ASA Grade Assessment: III - A patient with severe                         systemic disease.                        - After reviewing the risks and benefits, the patient                         was deemed in satisfactory condition to undergo the                         procedure.                        After obtaining informed consent, the endoscope was  passed under direct vision. Throughout the procedure,                         the patient's blood pressure, pulse, and oxygen                         saturations were monitored continuously. The Endoscope                         was introduced through the mouth, and advanced to the                         third part of duodenum. The upper GI  endoscopy was                         accomplished without difficulty. The patient tolerated                         the procedure well. Findings:      The examined esophagus was normal.      A medium-sized, submucosal, non-circumferential mass with no bleeding       and no stigmata of recent bleeding was found in the gastric antrum.       Biopsies were taken with a cold forceps for histology.      The cardia and gastric fundus were normal on retroflexion.      The examined duodenum was normal. Impression:            - Normal esophagus.                        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                         Biopsied.                        - Normal examined duodenum. Recommendation:        - Patient has a contact number available for                         emergencies. The signs and symptoms of potential                         delayed complications were discussed with the patient.                         Return to normal activities tomorrow. Written                         discharge instructions were provided to the patient.                        - Resume previous diet.                        - Continue present medications.                        - Await pathology results.                        -  No repeat if biopsies are benign                        - Return to my office PRN.                        - The findings and recommendations were discussed with                         the patient. Procedure Code(s):     --- Professional ---                        [phone], Esophagogastroduodenoscopy, flexible,                         transoral; with biopsy, single or multiple Diagnosis Code(s):     --- Professional ---                        K25.7, Chronic gastric ulcer without hemorrhage or                         perforation                        D50.9, Iron deficiency anemia, unspecified                        D49.0, Neoplasm of unspecified behavior of  digestive                         system CPT copyright 2019 American Medical Association. All rights reserved. The codes documented in this report are preliminary and upon coder review may  be revised to meet current compliance requirements. Efrain Sella MD, MD 05/11/2019 9:17:10 AM This report has been signed electronically. Number of Addenda: 0 Note Initiated On: 05/11/2019 8:56 AM Estimated Blood Loss:  Estimated blood loss was minimal.      [facility]

## 2019-05-11 NOTE — H&P (Signed)
Outpatient short stay form Pre-procedure 05/11/2019 8:21 AM Diane Patton K. Diane Patton, M.D.  Primary Physician: Diane Patton, M.D.  Reason for visit:  Iron deficiency anemia, hx of PUD, gastritis, "papule"  History of present illness:As above. Patient denies change in bowel habits, rectal bleeding, weight loss or abdominal pain.    No current facility-administered medications for this encounter.  Medications Prior to Admission  Medication Sig Dispense Refill Last Dose  . aspirin 81 MG tablet Take 81 mg by mouth daily.     Marland Kitchen BIDIL 20-37.5 MG tablet TAKE 1 TABLET BY MOUTH THREE TIMES DAILY (Patient taking differently: Take 1 tablet by mouth 3 (three) times daily. ) 270 tablet 3   . calcium carbonate (CALCIUM 600) 600 MG TABS tablet Take 600 mg by mouth 2 (two) times daily with a meal.      . cholecalciferol (VITAMIN D) 400 units TABS tablet Take 2,000 Units by mouth daily.      Marland Kitchen Cod Liver Oil w/Vit A & D CAPS Take 1 capsule by mouth daily.     Marland Kitchen ENTRESTO 97-103 MG TAKE 1 TABLET BY MOUTH TWICE DAILY (Patient taking differently: Take 1 tablet by mouth 2 (two) times daily. ) 180 tablet 3   . Fluticasone-Salmeterol (ADVAIR) 500-50 MCG/DOSE AEPB Inhale 1 puff into the lungs 2 (two) times daily.     . furosemide (LASIX) 40 MG tablet Take 40 mg by mouth daily.     Marland Kitchen glimepiride (AMARYL) 2 MG tablet Take 2 mg by mouth daily with breakfast.     . levocetirizine (XYZAL) 5 MG tablet Take 5 mg by mouth every evening.     . metFORMIN (GLUCOPHAGE) 500 MG tablet Take 1,000 mg by mouth 2 (two) times daily with a meal.     . metoprolol (TOPROL-XL) 200 MG 24 hr tablet TAKE 1 TABLET BY MOUTH ONCE DAILY. TAKE WITH OR IMMEDIATELY FOLLOWING A MEAL. (Patient taking differently: Take 200 mg by mouth daily. ) 90 tablet 3   . montelukast (SINGULAIR) 10 MG tablet Take 10 mg by mouth at bedtime.     . Multiple Vitamin (MULTIVITAMIN) tablet Take 1 tablet by mouth daily.      . Omega 3-6-9 Fatty Acids (TRIPLE OMEGA-3-6-9)  CAPS Take 1 capsule by mouth daily.     . pantoprazole (PROTONIX) 40 MG tablet Take 40 mg by mouth 2 (two) times daily.     . potassium chloride (K-DUR) 10 MEQ tablet TAKE 1 TABLET BY MOUTH ONCE DAILY (Patient taking differently: Take 10 mEq by mouth daily. ) 90 tablet 3   . rosuvastatin (CRESTOR) 20 MG tablet Take 20 mg by mouth daily.     . Semaglutide,0.25 or 0.5MG /DOS, (OZEMPIC, 0.25 OR 0.5 MG/DOSE,) 2 MG/1.5ML SOPN Inject 0.375 mLs into the skin once a week.     . Tiotropium Bromide Monohydrate (SPIRIVA RESPIMAT) 1.25 MCG/ACT AERS Inhale 2 puffs into the lungs daily.     . traMADol-acetaminophen (ULTRACET) 37.5-325 MG tablet Take 1 tablet by mouth every 6 (six) hours as needed. Takes twice a day     . traZODone (DESYREL) 100 MG tablet Take 100 mg by mouth at bedtime.     . vitamin B-12 (CYANOCOBALAMIN) 500 MCG tablet Take 500 mcg daily by mouth.     . vitamin E 400 UNIT capsule Take 400 Units by mouth daily.     Marland Kitchen albuterol (PROVENTIL HFA;VENTOLIN HFA) 108 (90 BASE) MCG/ACT inhaler Inhale 2 puffs into the lungs every 6 (six) hours as needed  for wheezing or shortness of breath.     . Biotin 1000 MCG tablet Take 1,000 mcg by mouth daily.      . cetirizine (ZYRTEC) 10 MG tablet Take 10 mg by mouth daily.     . ferrous gluconate (FERGON) 324 MG tablet Take 324 mg by mouth daily with breakfast.     . meloxicam (MOBIC) 7.5 MG tablet Take 7.5 mg by mouth 2 (two) times daily as needed for pain. Takes one every other day     . omeprazole (PRILOSEC) 20 MG capsule Take 20 mg by mouth 2 (two) times daily before a meal.     . ondansetron (ZOFRAN-ODT) 4 MG disintegrating tablet Take 4 mg every 8 (eight) hours as needed by mouth for nausea or vomiting.     . vitamin C (ASCORBIC ACID) 500 MG tablet Take 500 mg by mouth daily.         Allergies  Allergen Reactions  . Amoxicillin Other (See Comments)    GI distress  . Aspirin Diarrhea and Nausea And Vomiting  . Celebrex [Celecoxib] Nausea Only  . Latex  Itching and Swelling  . Penicillins Other (See Comments)    GI distress  . Allegra [Fexofenadine] Cough     Past Medical History:  Diagnosis Date  . Asthma   . Cardiomyopathy, nonischemic ([place])   . CHF (congestive heart failure) ([place])   . COPD (chronic obstructive pulmonary disease) ([place])   . Diabetes mellitus, type 2 ([place])   . Gastric ulcer without hemorrhage or perforation   . GERD (gastroesophageal reflux disease)   . HTN (hypertension)   . Hyperlipidemia   . IDA (iron deficiency anemia)   . Obstructive sleep apnea on CPAP   . Osteoarthritis    osteo of both knees  . Scoliosis     Review of systems:  Otherwise negative.    Physical Exam  Gen: Alert, oriented. Appears stated age.  HEENT: [place]/AT. PERRLA. Lungs: CTA, no wheezes. CV: RR nl S1, S2. Abd: soft, benign, no masses. BS+ Ext: No edema. Pulses 2+    Planned procedures: Proceed with EGD. The patient understands the nature of the planned procedure, indications, risks, alternatives and potential complications including but not limited to bleeding, infection, perforation, damage to internal organs and possible oversedation/side effects from anesthesia. The patient agrees and gives consent to proceed.  Please refer to procedure notes for findings, recommendations and patient disposition/instructions.     [name] K. Diane Patton, M.D. Gastroenterology 05/11/2019  8:21 AM

## 2019-05-11 NOTE — Anesthesia Postprocedure Evaluation (Signed)
Anesthesia Post Note  Patient: Leoda Sorce  Procedure(s) Performed: ESOPHAGOGASTRODUODENOSCOPY (EGD) (N/A )  Patient location during evaluation: Endoscopy Anesthesia Type: General Level of consciousness: awake and alert Pain management: pain level controlled Vital Signs Assessment: post-procedure vital signs reviewed and stable Respiratory status: spontaneous breathing, nonlabored ventilation, respiratory function stable and patient connected to nasal cannula oxygen Cardiovascular status: blood pressure returned to baseline and stable Postop Assessment: no apparent nausea or vomiting Anesthetic complications: no     Last Vitals:  Vitals:   05/11/19 0828 05/11/19 0919  BP: (!) 150/65   Pulse: 92   Resp: 20   Temp: (!) 36.3 C (!) 35.9 C    Last Pain:  Vitals:   05/11/19 0939  TempSrc:   PainSc: 0-No pain                 Arita Miss

## 2019-05-11 NOTE — Transfer of Care (Signed)
Immediate Anesthesia Transfer of Care Note  Patient: Diane Patton  Procedure(s) Performed: ESOPHAGOGASTRODUODENOSCOPY (EGD) (N/A )  Patient Location: Endoscopy Unit  Anesthesia Type:General  Level of Consciousness: awake, alert  and oriented  Airway & Oxygen Therapy: Patient Spontanous Breathing and Patient connected to nasal cannula oxygen  Post-op Assessment: Report given to RN and Post -op Vital signs reviewed and stable  Post vital signs: Reviewed and stable  Last Vitals:  Vitals Value Taken Time  BP    Temp    Pulse    Resp    SpO2      Last Pain:  Vitals:   05/11/19 0828  TempSrc: Temporal         Complications: No apparent anesthesia complications

## 2019-05-11 NOTE — Interval H&P Note (Signed)
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  The various methods of treatment have been discussed with the patient and family. After consideration of risks, benefits and other options for treatment, the patient has consented to  Procedure(s): ESOPHAGOGASTRODUODENOSCOPY (EGD) (N/A) as a surgical intervention.  The patient's history has been reviewed, patient examined, no change in status, stable for surgery.  I have reviewed the patient's chart and labs.  Questions were answered to the patient's satisfaction.     [place], Speed

## 2019-05-12 ENCOUNTER — Encounter: Payer: Self-pay | Admitting: *Deleted

## 2019-05-12 LAB — SURGICAL PATHOLOGY

## 2019-06-11 ENCOUNTER — Inpatient Hospital Stay: Payer: Medicare Other

## 2019-06-11 ENCOUNTER — Encounter (INDEPENDENT_AMBULATORY_CARE_PROVIDER_SITE_OTHER): Payer: Self-pay

## 2019-06-11 ENCOUNTER — Encounter: Payer: Self-pay | Admitting: Oncology

## 2019-06-11 ENCOUNTER — Other Ambulatory Visit: Payer: Self-pay

## 2019-06-11 ENCOUNTER — Inpatient Hospital Stay: Payer: Medicare Other | Attending: Oncology | Admitting: Oncology

## 2019-06-11 VITALS — BP 157/84 | HR 81 | Temp 96.8°F | Resp 18 | Ht 63.0 in | Wt 170.7 lb

## 2019-06-11 DIAGNOSIS — Z803 Family history of malignant neoplasm of breast: Secondary | ICD-10-CM | POA: Diagnosis not present

## 2019-06-11 DIAGNOSIS — Z87891 Personal history of nicotine dependence: Secondary | ICD-10-CM | POA: Insufficient documentation

## 2019-06-11 DIAGNOSIS — Z801 Family history of malignant neoplasm of trachea, bronchus and lung: Secondary | ICD-10-CM

## 2019-06-11 DIAGNOSIS — Z818 Family history of other mental and behavioral disorders: Secondary | ICD-10-CM

## 2019-06-11 DIAGNOSIS — D649 Anemia, unspecified: Secondary | ICD-10-CM | POA: Diagnosis not present

## 2019-06-11 DIAGNOSIS — Z880 Allergy status to penicillin: Secondary | ICD-10-CM

## 2019-06-11 DIAGNOSIS — Z596 Low income: Secondary | ICD-10-CM

## 2019-06-11 DIAGNOSIS — Z79899 Other long term (current) drug therapy: Secondary | ICD-10-CM | POA: Diagnosis not present

## 2019-06-11 DIAGNOSIS — Z8249 Family history of ischemic heart disease and other diseases of the circulatory system: Secondary | ICD-10-CM | POA: Diagnosis not present

## 2019-06-11 DIAGNOSIS — D72829 Elevated white blood cell count, unspecified: Secondary | ICD-10-CM | POA: Diagnosis not present

## 2019-06-11 DIAGNOSIS — R5383 Other fatigue: Secondary | ICD-10-CM

## 2019-06-11 DIAGNOSIS — Z8711 Personal history of peptic ulcer disease: Secondary | ICD-10-CM | POA: Insufficient documentation

## 2019-06-11 DIAGNOSIS — Z886 Allergy status to analgesic agent status: Secondary | ICD-10-CM | POA: Diagnosis not present

## 2019-06-11 DIAGNOSIS — Z7952 Long term (current) use of systemic steroids: Secondary | ICD-10-CM

## 2019-06-11 LAB — CBC WITH DIFFERENTIAL/PLATELET
Abs Immature Granulocytes: 0.18 10*3/uL — ABNORMAL HIGH (ref 0.00–0.07)
Basophils Absolute: 0 10*3/uL (ref 0.0–0.1)
Basophils Relative: 0 %
Eosinophils Absolute: 0 10*3/uL (ref 0.0–0.5)
Eosinophils Relative: 0 %
HCT: 35 % — ABNORMAL LOW (ref 36.0–46.0)
Hemoglobin: 11.3 g/dL — ABNORMAL LOW (ref 12.0–15.0)
Immature Granulocytes: 2 %
Lymphocytes Relative: 31 %
Lymphs Abs: 3.7 10*3/uL (ref 0.7–4.0)
MCH: 30.5 pg (ref 26.0–34.0)
MCHC: 32.3 g/dL (ref 30.0–36.0)
MCV: 94.6 fL (ref 80.0–100.0)
Monocytes Absolute: 0.9 10*3/uL (ref 0.1–1.0)
Monocytes Relative: 8 %
Neutro Abs: 7.1 10*3/uL (ref 1.7–7.7)
Neutrophils Relative %: 59 %
Platelets: 256 10*3/uL (ref 150–400)
RBC: 3.7 MIL/uL — ABNORMAL LOW (ref 3.87–5.11)
RDW: 12.9 % (ref 11.5–15.5)
WBC: 11.9 10*3/uL — ABNORMAL HIGH (ref 4.0–10.5)
nRBC: 0.2 % (ref 0.0–0.2)

## 2019-06-11 LAB — COMPREHENSIVE METABOLIC PANEL
ALT: 14 U/L (ref 0–44)
AST: 15 U/L (ref 15–41)
Albumin: 4 g/dL (ref 3.5–5.0)
Alkaline Phosphatase: 41 U/L (ref 38–126)
Anion gap: 12 (ref 5–15)
BUN: 23 mg/dL (ref 8–23)
CO2: 28 mmol/L (ref 22–32)
Calcium: 11.4 mg/dL — ABNORMAL HIGH (ref 8.9–10.3)
Chloride: 101 mmol/L (ref 98–111)
Creatinine, Ser: 1.02 mg/dL — ABNORMAL HIGH (ref 0.44–1.00)
GFR calc Af Amer: >60 mL/min (ref >60)
GFR calc non Af Amer: 54 mL/min — ABNORMAL LOW (ref >60)
Glucose, Bld: 90 mg/dL (ref 70–99)
Potassium: 4.2 mmol/L (ref 3.5–5.1)
Sodium: 141 mmol/L (ref 135–145)
Total Bilirubin: 1 mg/dL (ref 0.3–1.2)
Total Protein: 6.8 g/dL (ref 6.5–8.1)

## 2019-06-11 LAB — IRON AND TIBC
Iron: 62 ug/dL (ref 28–170)
Saturation Ratios: 16 % (ref 10.4–31.8)
TIBC: 393 ug/dL (ref 250–450)
UIBC: 331 ug/dL

## 2019-06-11 LAB — RETIC PANEL
Immature Retic Fract: 25.1 % — ABNORMAL HIGH (ref 2.3–15.9)
RBC.: 3.73 MIL/uL — ABNORMAL LOW (ref 3.87–5.11)
Retic Count, Absolute: 100 10*3/uL (ref 19.0–186.0)
Retic Ct Pct: 2.7 % (ref 0.4–3.1)
Reticulocyte Hemoglobin: 33 pg (ref >27.9)

## 2019-06-11 LAB — FERRITIN: Ferritin: 12 ng/mL (ref 11–307)

## 2019-06-11 NOTE — Progress Notes (Signed)
Pt here to establish care. No concerns voiced.  

## 2019-06-12 LAB — PTH, INTACT AND CALCIUM
Calcium, Total (PTH): 12 mg/dL — ABNORMAL HIGH (ref 8.7–10.3)
PTH: 25 pg/mL (ref 15–65)

## 2019-06-12 LAB — MULTIPLE MYELOMA PANEL, SERUM
Albumin SerPl Elph-Mcnc: 3.6 g/dL (ref 2.9–4.4)
Albumin/Glob SerPl: 1.4 (ref 0.7–1.7)
Alpha 1: 0.2 g/dL (ref 0.0–0.4)
Alpha2 Glob SerPl Elph-Mcnc: 0.8 g/dL (ref 0.4–1.0)
B-Globulin SerPl Elph-Mcnc: 1 g/dL (ref 0.7–1.3)
Gamma Glob SerPl Elph-Mcnc: 0.7 g/dL (ref 0.4–1.8)
Globulin, Total: 2.7 g/dL (ref 2.2–3.9)
IgA: 246 mg/dL (ref 64–422)
IgG (Immunoglobin G), Serum: 740 mg/dL (ref 586–1602)
IgM (Immunoglobulin M), Srm: 36 mg/dL (ref 26–217)
Total Protein ELP: 6.3 g/dL (ref 6.0–8.5)

## 2019-06-12 LAB — KAPPA/LAMBDA LIGHT CHAINS
Kappa free light chain: 17 mg/L (ref 3.3–19.4)
Kappa, lambda light chain ratio: 1.15 (ref 0.26–1.65)
Lambda free light chains: 14.8 mg/L (ref 5.7–26.3)

## 2019-06-12 NOTE — Progress Notes (Signed)
Hematology/Oncology Consult note [facility] [phone] [phone]   Patient Care Team: Tracie Harrier, MD as PCP - General (Internal Medicine) Isaias Cowman, MD as Consulting Physician (Cardiology) Alisa Graff, FNP as Nurse Practitioner (Family Medicine)  REFERRING PROVIDER: Tracie Harrier, MD  CHIEF COMPLAINTS/REASON FOR VISIT:  Evaluation of anemia  HISTORY OF PRESENTING ILLNESS:  Diane Patton is a  73 y.o.  female with PMH listed below who was referred to me for evaluation of anemia Reviewed patient's recent labs that was done.  05/25/2019 labs revealed anemia with hemoglobin of 10.6 Reviewed patient's previous labs ordered by primary care physician's office, anemia is chronic onset , duration is since at least 2015.  Associated signs and symptoms: Patient reports fatigue.  Denies SOB with exertion.  Denies weight loss, easy bruising, hematochezia, hemoptysis, hematuria. Context: History of GI bleeding: Denies               History of Chronic kidney disease; denies               History of autoimmune disease: Denies               History of hemolytic anemia.  Denies               Last colonoscopy: March 2021 EGD showed submucosal gastric antrum mass with biopsy showed mild nonspecific chronic gastritis.  Negative for active inflammation and H. pylori.  Negative for dysplasia or malignancy.  May 2018 colonoscopy showed diverticulosis in the sigmoid colon, ascending and descending colon and transverse colon                Also noticed that patient had chronic calcium since April 2020.  Calcium levels at 11.3 on 05/25/2019. 05/25/2019, ferritin 8.  Folic acid> 32.9, vitamin B12 565. Review of Systems  Constitutional: Positive for fatigue. Negative for appetite change, chills and fever.  HENT:   Negative for hearing loss and voice change.   Eyes: Negative for eye problems.  Respiratory: Negative for chest tightness and cough.    Cardiovascular: Negative for chest pain.  Gastrointestinal: Negative for abdominal distention, abdominal pain and blood in stool.  Endocrine: Negative for hot flashes.  Genitourinary: Negative for difficulty urinating and frequency.   Musculoskeletal: Negative for arthralgias.  Skin: Negative for itching and rash.  Neurological: Negative for extremity weakness.  Hematological: Negative for adenopathy.  Psychiatric/Behavioral: Negative for confusion.     MEDICAL HISTORY:  Past Medical History:  Diagnosis Date  . Asthma   . Cardiomyopathy, nonischemic ([place])   . CHF (congestive heart failure) ([place])   . COPD (chronic obstructive pulmonary disease) ([place])   . Diabetes mellitus, type 2 ([place])   . Gastric ulcer without hemorrhage or perforation   . GERD (gastroesophageal reflux disease)   . HTN (hypertension)   . Hyperlipidemia   . IDA (iron deficiency anemia)   . Obstructive sleep apnea on CPAP   . Osteoarthritis    osteo of both knees  . Scoliosis     SURGICAL HISTORY: Past Surgical History:  Procedure Laterality Date  . ABDOMINAL HYSTERECTOMY    . ankle (other)    . APPENDECTOMY    . BREAST BIOPSY Right 1999   neg  . BREAST CYST ASPIRATION     neg  . BREAST SURGERY    . CARDIAC CATHETERIZATION    . CLAVICLE SURGERY Right    Fx  . COLONOSCOPY WITH PROPOFOL N/A 07/10/2016   Procedure: COLONOSCOPY WITH PROPOFOL;  Surgeon: Lollie Sails, MD;  Location: [facility] ENDOSCOPY;  Service: Endoscopy;  Laterality: N/A;  . ear (otheR)    . ESOPHAGOGASTRODUODENOSCOPY N/A 05/11/2019   Procedure: ESOPHAGOGASTRODUODENOSCOPY (EGD);  Surgeon: Toledo, Benay Pike, MD;  Location: [facility] ENDOSCOPY;  Service: Gastroenterology;  Laterality: N/A;  . ESOPHAGOGASTRODUODENOSCOPY (EGD) WITH PROPOFOL N/A 07/10/2016   Procedure: ESOPHAGOGASTRODUODENOSCOPY (EGD) WITH PROPOFOL;  Surgeon: Lollie Sails, MD;  Location: [facility] ENDOSCOPY;  Service: Endoscopy;  Laterality: N/A;  . ESOPHAGOGASTRODUODENOSCOPY  (EGD) WITH PROPOFOL N/A 09/17/2017   Procedure: ESOPHAGOGASTRODUODENOSCOPY (EGD) WITH PROPOFOL;  Surgeon: Lollie Sails, MD;  Location: [facility] ENDOSCOPY;  Service: Endoscopy;  Laterality: N/A;  . feet (other)    . HERNIA REPAIR    . sinus (other)    . stomach    . UPPER ESOPHAGEAL ENDOSCOPIC ULTRASOUND (EUS) N/A 11/22/2016   Procedure: UPPER ESOPHAGEAL ENDOSCOPIC ULTRASOUND (EUS);  Surgeon: Reita Cliche, MD;  Location: [facility] ENDOSCOPY;  Service: Gastroenterology;  Laterality: N/A;    SOCIAL HISTORY: Social History   Socioeconomic History  . Marital status: Married    Spouse name: Not on file  . Number of children: Not on file  . Years of education: Not on file  . Highest education level: Not on file  Occupational History  . Occupation: retired  Tobacco Use  . Smoking status: Former Smoker    Packs/day: 1.00    Years: 15.00    Pack years: 15.00    Types: Cigarettes    Quit date: 07/21/1982    Years since quitting: 36.9  . Smokeless tobacco: Never Used  Substance and Sexual Activity  . Alcohol use: No    Alcohol/week: 15.0 standard drinks    Types: 15 Standard drinks or equivalent per week    Comment: quit drinking 04/1981  . Drug use: No  . Sexual activity: Not on file  Other Topics Concern  . Not on file  Social History Narrative   Disabled. Regularly exercises.    Social Determinants of Health   Financial Resource Strain:   . Difficulty of Paying Living Expenses:   Food Insecurity:   . Worried About Charity fundraiser in the Last Year:   . Arboriculturist in the Last Year:   Transportation Needs:   . Film/video editor (Medical):   Marland Kitchen Lack of Transportation (Non-Medical):   Physical Activity:   . Days of Exercise per Week:   . Minutes of Exercise per Session:   Stress:   . Feeling of Stress :   Social Connections:   . Frequency of Communication with Friends and Family:   . Frequency of Social Gatherings with Friends and Family:   . Attends Religious  Services:   . Active Member of Clubs or Organizations:   . Attends Archivist Meetings:   Marland Kitchen Marital Status:   Intimate Partner Violence:   . Fear of Current or Ex-Partner:   . Emotionally Abused:   Marland Kitchen Physically Abused:   . Sexually Abused:     FAMILY HISTORY: Family History  Problem Relation Age of Onset  . Alzheimer's disease Other   . Heart attack Mother   . Hypertension Mother   . Cancer Brother        lung  . Cancer Maternal Aunt        mouth and breast  . Breast cancer Maternal Aunt   . Cancer Daughter        throat  . Dementia Father     ALLERGIES:  is allergic to amoxicillin; aspirin; celebrex [celecoxib]; latex; penicillins; and allegra [fexofenadine].  MEDICATIONS:  Current Outpatient Medications  Medication Sig Dispense Refill  . albuterol (PROVENTIL HFA;VENTOLIN HFA) 108 (90 BASE) MCG/ACT inhaler Inhale 2 puffs into the lungs every 6 (six) hours as needed for wheezing or shortness of breath.    Marland Kitchen aspirin 81 MG tablet Take 81 mg by mouth daily.    Marland Kitchen BIDIL 20-37.5 MG tablet TAKE 1 TABLET BY MOUTH THREE TIMES DAILY (Patient taking differently: Take 1 tablet by mouth 3 (three) times daily. ) 270 tablet 3  . Biotin 1000 MCG tablet Take 1,000 mcg by mouth daily.     . calcium carbonate (CALCIUM 600) 600 MG TABS tablet Take 600 mg by mouth 2 (two) times daily with a meal.     . cetirizine (ZYRTEC) 10 MG tablet Take 10 mg by mouth daily.    . cholecalciferol (VITAMIN D) 400 units TABS tablet Take 2,000 Units by mouth daily.     Marland Kitchen Cod Liver Oil w/Vit A & D CAPS Take 1 capsule by mouth daily.    Marland Kitchen ENTRESTO 97-103 MG TAKE 1 TABLET BY MOUTH TWICE DAILY (Patient taking differently: Take 1 tablet by mouth 2 (two) times daily. ) 180 tablet 3  . ferrous gluconate (FERGON) 324 MG tablet Take 324 mg by mouth daily with breakfast.    . fluticasone (FLONASE) 50 MCG/ACT nasal spray Place into the nose.    Marland Kitchen Fluticasone-Salmeterol (ADVAIR) 500-50 MCG/DOSE AEPB Inhale 1 puff  into the lungs 2 (two) times daily.    . furosemide (LASIX) 40 MG tablet Take 40 mg by mouth daily.    Marland Kitchen glimepiride (AMARYL) 2 MG tablet Take 2 mg by mouth daily with breakfast.    . levocetirizine (XYZAL) 5 MG tablet Take 5 mg by mouth every evening.    . meloxicam (MOBIC) 7.5 MG tablet Take 7.5 mg by mouth 2 (two) times daily as needed for pain. Takes one every other day    . metFORMIN (GLUCOPHAGE) 500 MG tablet Take 1,000 mg by mouth 2 (two) times daily with a meal.    . metoprolol (TOPROL-XL) 200 MG 24 hr tablet TAKE 1 TABLET BY MOUTH ONCE DAILY. TAKE WITH OR IMMEDIATELY FOLLOWING A MEAL. (Patient taking differently: Take 200 mg by mouth daily. ) 90 tablet 3  . montelukast (SINGULAIR) 10 MG tablet Take 10 mg by mouth at bedtime.    . Multiple Vitamin (MULTIVITAMIN) tablet Take 1 tablet by mouth daily.     . Omega 3-6-9 Fatty Acids (TRIPLE OMEGA-3-6-9) CAPS Take 1 capsule by mouth daily.    Marland Kitchen omeprazole (PRILOSEC) 20 MG capsule Take 20 mg by mouth 2 (two) times daily before a meal.    . ondansetron (ZOFRAN-ODT) 4 MG disintegrating tablet Take 4 mg every 8 (eight) hours as needed by mouth for nausea or vomiting.    . pantoprazole (PROTONIX) 40 MG tablet Take 40 mg by mouth 2 (two) times daily.    . potassium chloride (K-DUR) 10 MEQ tablet TAKE 1 TABLET BY MOUTH ONCE DAILY (Patient taking differently: Take 10 mEq by mouth daily. ) 90 tablet 3  . predniSONE (DELTASONE) 10 MG tablet Take by mouth.    . rosuvastatin (CRESTOR) 20 MG tablet Take 20 mg by mouth daily.    . Semaglutide,0.25 or 0.5MG/DOS, (OZEMPIC, 0.25 OR 0.5 MG/DOSE,) 2 MG/1.5ML SOPN Inject 0.375 mLs into the skin once a week.    . sucralfate (CARAFATE) 1 g tablet Take  by mouth.    . Tiotropium Bromide Monohydrate (SPIRIVA RESPIMAT) 1.25 MCG/ACT AERS Inhale 2 puffs into the lungs daily.    . traMADol-acetaminophen (ULTRACET) 37.5-325 MG tablet Take 1 tablet by mouth every 6 (six) hours as needed. Takes twice a day    . traZODone  (DESYREL) 100 MG tablet Take 100 mg by mouth at bedtime.    . vitamin B-12 (CYANOCOBALAMIN) 500 MCG tablet Take 500 mcg daily by mouth.    . vitamin C (ASCORBIC ACID) 500 MG tablet Take 500 mg by mouth daily.     . vitamin E 400 UNIT capsule Take 400 Units by mouth daily.     No current facility-administered medications for this visit.     PHYSICAL EXAMINATION: ECOG PERFORMANCE STATUS: 1 - Symptomatic but completely ambulatory Vitals:   06/11/19 0938  BP: (!) 157/84  Pulse: 81  Resp: 18  Temp: (!) 96.8 F (36 C)   Filed Weights   06/11/19 0938  Weight: 170 lb 11.2 oz (77.4 kg)    Physical Exam Constitutional:      General: She is not in acute distress. HENT:     Head: Normocephalic and atraumatic.  Eyes:     General: No scleral icterus. Cardiovascular:     Rate and Rhythm: Normal rate and regular rhythm.     Heart sounds: Normal heart sounds.  Pulmonary:     Effort: Pulmonary effort is normal. No respiratory distress.     Breath sounds: No wheezing.  Abdominal:     General: Bowel sounds are normal. There is no distension.     Palpations: Abdomen is soft.  Musculoskeletal:        General: No deformity. Normal range of motion.     Cervical back: Normal range of motion and neck supple.  Skin:    General: Skin is warm and dry.     Findings: No erythema or rash.  Neurological:     Mental Status: She is alert and oriented to person, place, and time. Mental status is at baseline.     Cranial Nerves: No cranial nerve deficit.     Coordination: Coordination normal.  Psychiatric:        Mood and Affect: Mood normal.      LABORATORY DATA:  I have reviewed the data as listed Lab Results  Component Value Date   WBC 11.9 (H) 06/11/2019   HGB 11.3 (L) 06/11/2019   HCT 35.0 (L) 06/11/2019   MCV 94.6 06/11/2019   PLT 256 06/11/2019   Recent Labs    10/30/18 0301 10/30/18 0301 10/30/18 0851 10/30/18 0851 10/31/18 0551 06/11/19 1015  NA 140   < > 141  --  142  141  K 5.5*   < > 3.8  --  3.7 4.2  CL 105   < > 109  --  112* 101  CO2 25   < > 24  --  24 28  GLUCOSE 94   < > 94  --  82 90  BUN 21   < > 23  --  17 23  CREATININE 0.91   < > 1.02*  --  0.87 1.02*  CALCIUM 10.9*   < > 9.4   < > 8.6* 11.4*  12.0*  GFRNONAA >60   < > 54*  --  >60 54*  GFRAA >60   < > >60  --  >60 >60  PROT 7.2  --   --   --   --  6.8  ALBUMIN 4.5  --   --   --   --  4.0  AST 26  --   --   --   --  15  ALT 18  --   --   --   --  14  ALKPHOS 35*  --   --   --   --  41  BILITOT 1.6*  --   --   --   --  1.0   < > = values in this interval not displayed.   Iron/TIBC/Ferritin/ %Sat    Component Value Date/Time   IRON 62 06/11/2019 1015   TIBC 393 06/11/2019 1015   FERRITIN 12 06/11/2019 1015   IRONPCTSAT 16 06/11/2019 1015      ASSESSMENT & PLAN:  1. Anemia, unspecified type   2. Hypercalcemia   3. Leukocytosis, unspecified type    Anemia: multifactorial with possible causes including chronic blood loss, hyper/hypothyroidism, nutritional deficiency, infection/chronic inflammation, hemolysis, underlying bone marrow disorders . Will check CBC w differential, CMP, vitamin B12, Folate, iron/TIBC, ferritin, reticulocytes, , blood smear,     #Hypercalcemia, monoclonal gammopathy evaluation.  Check lites ratio.  Check PTH intact and calcium. Labs reviewed, PTH is inappropriately normal with calcium elevated at 12.  Likely not parathyroid hypercalcemia.  Plan to check PTHrp, 25-hydroxy vitamin D, 1, 25 hydroxy vitamin D level Anemia, hemoglobin 11.3, iron panel showed ferritin borderline at 12, iron saturation 16.  Recommend patient to start oral iron supplementation.   Mild leukocytosis likely secondary to recent prednisone use.  Orders Placed This Encounter  Procedures  . CBC with Differential/Platelet    Standing Status:   Future    Number of Occurrences:   1    Standing Expiration Date:   12/10/2020  . Comprehensive metabolic panel    Standing Status:    Future    Number of Occurrences:   1    Standing Expiration Date:   12/10/2020  . Retic Panel    Standing Status:   Future    Number of Occurrences:   1    Standing Expiration Date:   12/10/2020  . Multiple Myeloma Panel (SPEP&IFE w/QIG)    Standing Status:   Future    Number of Occurrences:   1    Standing Expiration Date:   12/10/2020  . Kappa/lambda light chains    Standing Status:   Future    Number of Occurrences:   1    Standing Expiration Date:   12/10/2020  . Iron and TIBC    Standing Status:   Future    Number of Occurrences:   1    Standing Expiration Date:   12/10/2020  . Ferritin    Standing Status:   Future    Number of Occurrences:   1    Standing Expiration Date:   12/10/2020  . Parathyroid hormone,  intact and calcium    Standing Status:   Future    Number of Occurrences:   1    Standing Expiration Date:   06/10/2020    All questions were answered. The patient knows to call the clinic with any problems questions or concerns. CcTracie Harrier, MD  Return of visit: 2 weeks Thank you for this kind referral and the opportunity to participate in the care of this patient. A copy of today's note is routed to referring provider   Earlie Server, MD, PhD 06/12/2019

## 2019-06-15 ENCOUNTER — Telehealth: Payer: Self-pay

## 2019-06-15 NOTE — Telephone Encounter (Signed)
-----   Message from Earlie Server, MD sent at 06/12/2019 11:46 PM EDT ----- Please tell patient that her calcium levels are high and I recommend additional blood work after reviewed her current blood work up results.  Recommend pt to continue taking oral iron supplementation. Blood level has improved.   Please schedule another lab encounter next week and see me 1 week after the lab encounter- labs are ordered. . Thanks.

## 2019-06-15 NOTE — Telephone Encounter (Signed)
Left message to call for lab results. 

## 2019-06-16 NOTE — Telephone Encounter (Signed)
Done.. Appts has been sched as requested.Bonne Dolores calling pt x2 no answer. I will try to contact her again and also will mial out an update reminder letter to make pt aware.

## 2019-06-16 NOTE — Telephone Encounter (Signed)
Patient informed.  Please schedule lab next week with MD the following week and let pt know appt details.  Thanks

## 2019-06-23 ENCOUNTER — Inpatient Hospital Stay: Payer: Medicare Other

## 2019-06-25 ENCOUNTER — Other Ambulatory Visit: Payer: Self-pay

## 2019-06-25 ENCOUNTER — Inpatient Hospital Stay: Payer: Medicare Other | Attending: Oncology

## 2019-06-25 DIAGNOSIS — Z803 Family history of malignant neoplasm of breast: Secondary | ICD-10-CM | POA: Insufficient documentation

## 2019-06-25 DIAGNOSIS — D72829 Elevated white blood cell count, unspecified: Secondary | ICD-10-CM | POA: Insufficient documentation

## 2019-06-25 DIAGNOSIS — Z880 Allergy status to penicillin: Secondary | ICD-10-CM | POA: Insufficient documentation

## 2019-06-25 DIAGNOSIS — Z87891 Personal history of nicotine dependence: Secondary | ICD-10-CM | POA: Insufficient documentation

## 2019-06-25 DIAGNOSIS — Z818 Family history of other mental and behavioral disorders: Secondary | ICD-10-CM | POA: Diagnosis not present

## 2019-06-25 DIAGNOSIS — Z79899 Other long term (current) drug therapy: Secondary | ICD-10-CM | POA: Diagnosis not present

## 2019-06-25 DIAGNOSIS — M1990 Unspecified osteoarthritis, unspecified site: Secondary | ICD-10-CM | POA: Insufficient documentation

## 2019-06-25 DIAGNOSIS — Z8711 Personal history of peptic ulcer disease: Secondary | ICD-10-CM | POA: Diagnosis not present

## 2019-06-25 DIAGNOSIS — Z801 Family history of malignant neoplasm of trachea, bronchus and lung: Secondary | ICD-10-CM | POA: Insufficient documentation

## 2019-06-25 DIAGNOSIS — Z886 Allergy status to analgesic agent status: Secondary | ICD-10-CM | POA: Insufficient documentation

## 2019-06-25 DIAGNOSIS — Z8249 Family history of ischemic heart disease and other diseases of the circulatory system: Secondary | ICD-10-CM | POA: Insufficient documentation

## 2019-06-25 DIAGNOSIS — Z8719 Personal history of other diseases of the digestive system: Secondary | ICD-10-CM | POA: Diagnosis not present

## 2019-06-25 DIAGNOSIS — D509 Iron deficiency anemia, unspecified: Secondary | ICD-10-CM | POA: Diagnosis not present

## 2019-06-25 DIAGNOSIS — R5383 Other fatigue: Secondary | ICD-10-CM | POA: Insufficient documentation

## 2019-06-25 DIAGNOSIS — Z808 Family history of malignant neoplasm of other organs or systems: Secondary | ICD-10-CM | POA: Insufficient documentation

## 2019-06-25 LAB — VITAMIN D 25 HYDROXY (VIT D DEFICIENCY, FRACTURES): Vit D, 25-Hydroxy: 28.21 ng/mL — ABNORMAL LOW (ref 30–100)

## 2019-06-29 ENCOUNTER — Other Ambulatory Visit: Payer: Self-pay

## 2019-06-29 ENCOUNTER — Emergency Department
Admission: EM | Admit: 2019-06-29 | Discharge: 2019-06-29 | Disposition: A | Discharge status: Home / Self Care | Payer: Medicare Other | Attending: Emergency Medicine | Admitting: Emergency Medicine

## 2019-06-29 ENCOUNTER — Encounter: Payer: Self-pay | Admitting: *Deleted

## 2019-06-29 ENCOUNTER — Emergency Department: Payer: Medicare Other

## 2019-06-29 DIAGNOSIS — I5022 Chronic systolic (congestive) heart failure: Secondary | ICD-10-CM | POA: Insufficient documentation

## 2019-06-29 DIAGNOSIS — Z87891 Personal history of nicotine dependence: Secondary | ICD-10-CM | POA: Diagnosis not present

## 2019-06-29 DIAGNOSIS — J0110 Acute frontal sinusitis, unspecified: Secondary | ICD-10-CM | POA: Insufficient documentation

## 2019-06-29 DIAGNOSIS — H7291 Unspecified perforation of tympanic membrane, right ear: Secondary | ICD-10-CM | POA: Diagnosis not present

## 2019-06-29 DIAGNOSIS — J449 Chronic obstructive pulmonary disease, unspecified: Secondary | ICD-10-CM | POA: Diagnosis not present

## 2019-06-29 DIAGNOSIS — Z7982 Long term (current) use of aspirin: Secondary | ICD-10-CM | POA: Diagnosis not present

## 2019-06-29 DIAGNOSIS — R519 Headache, unspecified: Secondary | ICD-10-CM | POA: Diagnosis present

## 2019-06-29 DIAGNOSIS — E119 Type 2 diabetes mellitus without complications: Secondary | ICD-10-CM | POA: Diagnosis not present

## 2019-06-29 DIAGNOSIS — Z91040 Latex allergy status: Secondary | ICD-10-CM | POA: Insufficient documentation

## 2019-06-29 DIAGNOSIS — I110 Hypertensive heart disease with heart failure: Secondary | ICD-10-CM | POA: Diagnosis not present

## 2019-06-29 DIAGNOSIS — Z7984 Long term (current) use of oral hypoglycemic drugs: Secondary | ICD-10-CM | POA: Insufficient documentation

## 2019-06-29 DIAGNOSIS — Z79899 Other long term (current) drug therapy: Secondary | ICD-10-CM | POA: Insufficient documentation

## 2019-06-29 MED ORDER — CEFDINIR 300 MG PO CAPS: 300.0000 mg | ORAL_CAPSULE | Freq: Two times a day (BID) | ORAL | 0 refills | Status: AC

## 2019-06-29 NOTE — Discharge Instructions (Addendum)
Please seek medical attention for any high fevers, chest pain, shortness of breath, change in behavior, persistent vomiting, bloody stool or any other new or concerning symptoms.  

## 2019-06-29 NOTE — ED Triage Notes (Addendum)
Pt brought in by ems from home. Pt states she had a headache this am and then her right ear started hurting with bleeding from ear today.  Bright red blood in right ear now.  Pt alert  Speech clear.

## 2019-06-29 NOTE — ED Triage Notes (Signed)
Pt comes into the ED via EMS from home with c/o right ear bleeding and HA  With no other complaints or defictis.

## 2019-06-29 NOTE — ED Notes (Signed)
E-signature not working at this time. Pt verbalized understanding of D/C instructions, prescriptions and follow up care with no further questions at this time. Pt in NAD and ambulatory at time of D/C.  

## 2019-06-30 ENCOUNTER — Inpatient Hospital Stay: Payer: Medicare Other | Admitting: Oncology

## 2019-06-30 NOTE — ED Provider Notes (Signed)
[facility] Emergency Department Provider Note   ____________________________________________   I have reviewed the triage vital signs and the nursing notes.   HISTORY  Chief Complaint Headache   History limited by: Not Limited   HPI Diane Patton is a 74 y.o. female who presents to the emergency department today because of concern for headache and right ear bleeding.  The patient states that the headache is located at her forehead and the front of her cheeks.  She states it feels like when she has issues with her sinuses.  However she did notice today some bleeding from her right ear.  She denies any bleeding from her right ear although states that she has a long history of problems with that right ear.  She states she has had issues since she was in high school and a few years ago had a tube placed in the ear. Denies any new hearing change. Patient denies any fevers.  She denies any recent trauma to her head.   Records reviewed. Per medical record review patient has a history of CHF, COPD, GERD.  Past Medical History:  Diagnosis Date  . Asthma   . Cardiomyopathy, nonischemic ([place])   . CHF (congestive heart failure) ([place])   . COPD (chronic obstructive pulmonary disease) ([place])   . Diabetes mellitus, type 2 ([place])   . Gastric ulcer without hemorrhage or perforation   . GERD (gastroesophageal reflux disease)   . HTN (hypertension)   . Hyperlipidemia   . IDA (iron deficiency anemia)   . Obstructive sleep apnea on CPAP   . Osteoarthritis    osteo of both knees  . Scoliosis     Patient Active Problem List   Diagnosis Date Noted  . Gastroenteritis 10/31/2018  . Acute gastroenteritis 10/30/2018  . Sleep apnea 01/19/2016  . Insomnia 03/21/2015  . Cough 11/01/2014  . Diabetes ([place]) 09/29/2014  . Chronic obstructive pulmonary disease ([place]) 09/29/2014  . Chronic systolic heart failure ([place]) 06/09/2014  . HYPERLIPIDEMIA-MIXED 12/16/2008  . Essential  hypertension 12/16/2008    Past Surgical History:  Procedure Laterality Date  . ABDOMINAL HYSTERECTOMY    . ankle (other)    . APPENDECTOMY    . BREAST BIOPSY Right 1999   neg  . BREAST CYST ASPIRATION     neg  . BREAST SURGERY    . CARDIAC CATHETERIZATION    . CLAVICLE SURGERY Right    Fx  . COLONOSCOPY WITH PROPOFOL N/A 07/10/2016   Procedure: COLONOSCOPY WITH PROPOFOL;  Surgeon: Lollie Sails, MD;  Location: [facility] ENDOSCOPY;  Service: Endoscopy;  Laterality: N/A;  . ear (otheR)    . ESOPHAGOGASTRODUODENOSCOPY N/A 05/11/2019   Procedure: ESOPHAGOGASTRODUODENOSCOPY (EGD);  Surgeon: Toledo, Benay Pike, MD;  Location: [facility] ENDOSCOPY;  Service: Gastroenterology;  Laterality: N/A;  . ESOPHAGOGASTRODUODENOSCOPY (EGD) WITH PROPOFOL N/A 07/10/2016   Procedure: ESOPHAGOGASTRODUODENOSCOPY (EGD) WITH PROPOFOL;  Surgeon: Lollie Sails, MD;  Location: [facility] ENDOSCOPY;  Service: Endoscopy;  Laterality: N/A;  . ESOPHAGOGASTRODUODENOSCOPY (EGD) WITH PROPOFOL N/A 09/17/2017   Procedure: ESOPHAGOGASTRODUODENOSCOPY (EGD) WITH PROPOFOL;  Surgeon: Lollie Sails, MD;  Location: [facility] ENDOSCOPY;  Service: Endoscopy;  Laterality: N/A;  . feet (other)    . HERNIA REPAIR    . sinus (other)    . stomach    . UPPER ESOPHAGEAL ENDOSCOPIC ULTRASOUND (EUS) N/A 11/22/2016   Procedure: UPPER ESOPHAGEAL ENDOSCOPIC ULTRASOUND (EUS);  Surgeon: Reita Cliche, MD;  Location: [facility] ENDOSCOPY;  Service: Gastroenterology;  Laterality: N/A;    Prior  to Admission medications   Medication Sig Start Date End Date Taking? Authorizing Provider  albuterol (PROVENTIL HFA;VENTOLIN HFA) 108 (90 BASE) MCG/ACT inhaler Inhale 2 puffs into the lungs every 6 (six) hours as needed for wheezing or shortness of breath.    [provider]  aspirin 81 MG tablet Take 81 mg by mouth daily.    [provider]  BIDIL 20-37.5 MG tablet TAKE 1 TABLET BY MOUTH THREE TIMES DAILY Patient taking differently: Take 1  tablet by mouth 3 (three) times daily.  05/20/17   Alisa Graff, FNP  Biotin 1000 MCG tablet Take 1,000 mcg by mouth daily.     [provider]  calcium carbonate (CALCIUM 600) 600 MG TABS tablet Take 600 mg by mouth 2 (two) times daily with a meal.     [provider]  cefdinir (OMNICEF) 300 MG capsule Take 1 capsule (300 mg total) by mouth 2 (two) times daily for 10 days. 06/29/19 07/09/19  Nance Pear, MD  cetirizine (ZYRTEC) 10 MG tablet Take 10 mg by mouth daily.    [provider]  cholecalciferol (VITAMIN D) 400 units TABS tablet Take 2,000 Units by mouth daily.     [provider]  [facility] Liver Oil w/Vit A & D CAPS Take 1 capsule by mouth daily.    [provider]  ENTRESTO 97-103 MG TAKE 1 TABLET BY MOUTH TWICE DAILY Patient taking differently: Take 1 tablet by mouth 2 (two) times daily.  09/18/17   Alisa Graff, FNP  ferrous gluconate (FERGON) 324 MG tablet Take 324 mg by mouth daily with breakfast.    [provider]  fluticasone (FLONASE) 50 MCG/ACT nasal spray Place into the nose. 01/30/19 01/30/20  [provider]  Fluticasone-Salmeterol (ADVAIR) 500-50 MCG/DOSE AEPB Inhale 1 puff into the lungs 2 (two) times daily.    [provider]  furosemide (LASIX) 40 MG tablet Take 40 mg by mouth daily.    [provider]  glimepiride (AMARYL) 2 MG tablet Take 2 mg by mouth daily with breakfast.    [provider]  levocetirizine (XYZAL) 5 MG tablet Take 5 mg by mouth every evening.    [provider]  meloxicam (MOBIC) 7.5 MG tablet Take 7.5 mg by mouth 2 (two) times daily as needed for pain. Takes one every other day    [provider]  metFORMIN (GLUCOPHAGE) 500 MG tablet Take 1,000 mg by mouth 2 (two) times daily with a meal.    [provider]  metoprolol (TOPROL-XL) 200 MG 24 hr tablet TAKE 1 TABLET BY MOUTH ONCE DAILY. TAKE WITH OR IMMEDIATELY FOLLOWING A MEAL. Patient  taking differently: Take 200 mg by mouth daily.  05/20/17   Alisa Graff, FNP  montelukast (SINGULAIR) 10 MG tablet Take 10 mg by mouth at bedtime.    [provider]  Multiple Vitamin (MULTIVITAMIN) tablet Take 1 tablet by mouth daily.     [provider]  Omega 3-6-9 Fatty Acids (TRIPLE OMEGA-3-6-9) CAPS Take 1 capsule by mouth daily.    [provider]  omeprazole (PRILOSEC) 20 MG capsule Take 20 mg by mouth 2 (two) times daily before a meal.    [provider]  ondansetron (ZOFRAN-ODT) 4 MG disintegrating tablet Take 4 mg every 8 (eight) hours as needed by mouth for nausea or vomiting.    [provider]  pantoprazole (PROTONIX) 40 MG tablet Take 40 mg by mouth 2 (two) times daily.  [provider]  potassium chloride (K-DUR) 10 MEQ tablet TAKE 1 TABLET BY MOUTH ONCE DAILY Patient taking differently: Take 10 mEq by mouth daily.  09/18/17   Alisa Graff, FNP  predniSONE (DELTASONE) 10 MG tablet Take by mouth. 06/01/19   [provider]  rosuvastatin (CRESTOR) 20 MG tablet Take 20 mg by mouth daily.    [provider]  Semaglutide,0.25 or 0.5MG /DOS, (OZEMPIC, 0.25 OR 0.5 MG/DOSE,) 2 MG/1.5ML SOPN Inject 0.375 mLs into the skin once a week. 10/22/18   [provider]  sucralfate (CARAFATE) 1 g tablet Take by mouth. 03/05/19 03/04/20  [provider]  Tiotropium Bromide Monohydrate (SPIRIVA RESPIMAT) 1.25 MCG/ACT AERS Inhale 2 puffs into the lungs daily.    [provider]  traMADol-acetaminophen (ULTRACET) 37.5-325 MG tablet Take 1 tablet by mouth every 6 (six) hours as needed. Takes twice a day    [provider]  traZODone (DESYREL) 100 MG tablet Take 100 mg by mouth at bedtime.    [provider]  vitamin B-12 (CYANOCOBALAMIN) 500 MCG tablet Take 500 mcg daily by mouth.    [provider]  vitamin C (ASCORBIC ACID) 500 MG tablet Take 500 mg by mouth daily.     [provider]  vitamin E 400 UNIT capsule Take 400 Units by mouth daily.    [provider]    Allergies Amoxicillin, Aspirin, Celebrex [celecoxib], Latex, Penicillins, and Allegra [fexofenadine]  Family History  Problem Relation Age of Onset  . Alzheimer's disease Other   . Heart attack Mother   . Hypertension Mother   . Cancer Brother        lung  . Cancer Maternal Aunt        mouth and breast  . Breast cancer Maternal Aunt   . Cancer Daughter        throat  . Dementia Father     Social History Social History   Tobacco Use  . Smoking status: Former Smoker    Packs/day: 1.00    Years: 15.00    Pack years: 15.00    Types: Cigarettes    Quit date: 07/21/1982    Years since quitting: 36.9  . Smokeless tobacco: Never Used  Substance Use Topics  . Alcohol use: No    Alcohol/week: 15.0 standard drinks    Types: 15 Standard drinks or equivalent per week    Comment: quit drinking 04/1981  . Drug use: No    Review of Systems Constitutional: No fever/chills Eyes: No visual changes. ENT: Positive for right ear bleeding. Cardiovascular: Denies chest pain. Respiratory: Denies shortness of breath. Gastrointestinal: No abdominal pain.  No nausea, no vomiting.  No diarrhea.   Genitourinary: Negative for dysuria. Musculoskeletal: Negative for back pain. Skin: Negative for rash. Neurological: Positive for headache. ____________________________________________   PHYSICAL EXAM:  VITAL SIGNS: ED Triage Vitals  Enc Vitals Group     BP 06/29/19 1821 (!) 147/60     Pulse Rate 06/29/19 1821 82     Resp 06/29/19 1821 20     Temp 06/29/19 1821 99 F (37.2 C)     Temp Source 06/29/19 1821 Oral     SpO2 06/29/19 1821 97 %     Weight 06/29/19 1823 170 lb (77.1 kg)     Height 06/29/19 1823 5\' 3"  (1.6 m)     Head Circumference --      Peak Flow --      Pain Score 06/29/19 1823 8  Constitutional: Alert and oriented.  Eyes: Conjunctivae are normal.  ENT      Head:  Normocephalic and atraumatic.      Nose: No congestion/rhinnorhea.      Ears: Left ear wnl. Right ear with some blood in external auditory canal. TM appears ruptured.       Mouth/Throat: Mucous membranes are moist.      Neck: No stridor. Hematological/Lymphatic/Immunilogical: No cervical lymphadenopathy. Cardiovascular: Normal rate, regular rhythm.  No murmurs, rubs, or gallops.  Respiratory: Normal respiratory effort without tachypnea nor retractions. Breath sounds are clear and equal bilaterally. No wheezes/rales/rhonchi. Gastrointestinal: Soft and non tender. No rebound. No guarding.  Genitourinary: Deferred Musculoskeletal: Normal range of motion in all extremities. No lower extremity edema. Neurologic:  Normal speech and language. No gross focal neurologic deficits are appreciated.  Skin:  Skin is warm, dry and intact. No rash noted. Psychiatric: Mood and affect are normal. Speech and behavior are normal. Patient exhibits appropriate insight and judgment.  ____________________________________________    LABS (pertinent positives/negatives)  None  ____________________________________________   EKG  None  ____________________________________________    RADIOLOGY  CT head No acute abnormality  ____________________________________________   PROCEDURES  Procedures  ____________________________________________   INITIAL IMPRESSION / ASSESSMENT AND PLAN / ED COURSE  Pertinent labs & imaging results that were available during my care of the patient were reviewed by me and considered in my medical decision making (see chart for details).   Patient presented to the emergency department today because of concerns for headache as well as bleeding from the right ear.  Where patient is describing the headache does make me concerned for sinusitis.  Additionally with the right ear bleeding and apparent ruptured tympanic membrane that have concern for possible otitis media.   Patient denies any trauma to her head.  CT head was obtained from triage which did not show any acute abnormality.  Will start patient on antibiotics.  Discussed with patient importance of ENT follow-up.   ____________________________________________   FINAL CLINICAL IMPRESSION(S) / ED DIAGNOSES  Final diagnoses:  Acute frontal sinusitis, recurrence not specified  Perforation of right tympanic membrane     Note: This dictation was prepared with Dragon dictation. Any transcriptional errors that result from this process are unintentional     Nance Pear, MD 06/30/19 1609

## 2019-07-01 ENCOUNTER — Telehealth: Payer: Self-pay

## 2019-07-01 NOTE — Telephone Encounter (Signed)
Patient is scheduled for MD to discuss additional lab results from 06/25/19.  The lab results are still pending.  Called [facility] to confirm, they are still pending and should be resulted by this Th or Fri.  Patient will need to r/s for early next week.  Patient request appt to be r/s for next Friday (07/10/19 @ 9:15).  Advised patient we will be looking for lab results and if MD wants to see her sooner we would give her a call.

## 2019-07-02 ENCOUNTER — Inpatient Hospital Stay: Payer: Medicare Other | Admitting: Oncology

## 2019-07-02 LAB — PTH-RELATED PEPTIDE: PTH-related peptide: <2 pmol/L

## 2019-07-03 LAB — VITAMIN D 1,25 DIHYDROXY
Vitamin D 1, 25 (OH)2 Total: 39 pg/mL
Vitamin D2 1, 25 (OH)2: 12 pg/mL
Vitamin D3 1, 25 (OH)2: 27 pg/mL

## 2019-07-07 ENCOUNTER — Ambulatory Visit: Payer: Medicare Other | Admitting: Oncology

## 2019-07-10 ENCOUNTER — Inpatient Hospital Stay (HOSPITAL_BASED_OUTPATIENT_CLINIC_OR_DEPARTMENT_OTHER): Payer: Medicare Other | Admitting: Oncology

## 2019-07-10 ENCOUNTER — Encounter: Payer: Self-pay | Admitting: Oncology

## 2019-07-10 ENCOUNTER — Other Ambulatory Visit: Payer: Self-pay

## 2019-07-10 VITALS — BP 129/77 | HR 83 | Temp 96.2°F | Resp 18 | Wt 177.2 lb

## 2019-07-10 DIAGNOSIS — D509 Iron deficiency anemia, unspecified: Secondary | ICD-10-CM | POA: Diagnosis not present

## 2019-07-10 DIAGNOSIS — D72829 Elevated white blood cell count, unspecified: Secondary | ICD-10-CM | POA: Diagnosis not present

## 2019-07-10 NOTE — Progress Notes (Signed)
Patient here for follow up. Pt started taking sucralfate and reports having dark stools after that. She is currently on antibiotic for sinus infection.

## 2019-07-10 NOTE — Progress Notes (Signed)
Hematology/Oncology Consult note [facility] [phone] [phone]   Patient Care Team: Tracie Harrier, MD as PCP - General (Internal Medicine) Isaias Cowman, MD as Consulting Physician (Cardiology) Alisa Graff, FNP as Nurse Practitioner (Family Medicine)  REFERRING PROVIDER: Tracie Harrier, MD  CHIEF COMPLAINTS/REASON FOR VISIT:  Evaluation of anemia  HISTORY OF PRESENTING ILLNESS:  Diane Patton is a  74 y.o.  female with PMH listed below who was referred to me for evaluation of anemia Reviewed patient's recent labs that was done.  05/25/2019 labs revealed anemia with hemoglobin of 10.6 Reviewed patient's previous labs ordered by primary care physician's office, anemia is chronic onset , duration is since at least 2015.  Associated signs and symptoms: Patient reports fatigue.  Denies SOB with exertion.  Denies weight loss, easy bruising, hematochezia, hemoptysis, hematuria. Context: History of GI bleeding: Denies               History of Chronic kidney disease; denies               History of autoimmune disease: Denies               History of hemolytic anemia.  Denies               Last colonoscopy: March 2021 EGD showed submucosal gastric antrum mass with biopsy showed mild nonspecific chronic gastritis.  Negative for active inflammation and H. pylori.  Negative for dysplasia or malignancy.  May 2018 colonoscopy showed diverticulosis in the sigmoid colon, ascending and descending colon and transverse colon                Also noticed that patient had chronic calcium since April 2020.  Calcium levels at 11.3 on 05/25/2019. 05/25/2019, ferritin 8.  Folic acid> 99991111, vitamin B12 565.   INTERVAL HISTORY Diane Patton is a 74 y.o. female who has above history reviewed by me today presents for follow up visit for management of anemia and hypocalcemia. Problems and complaints are listed below: Patient reports dark stool recently.   She takes sucralfate Her previous EGD showed chronic gastritis negative for dysplasia or malignancy.  May 2018 colonoscopy showed diverticulosis. She cannot tolerate oral iron due to GI side effects.  She has been take liver pudding every other day  Review of Systems  Constitutional: Positive for fatigue. Negative for appetite change, chills and fever.  HENT:   Negative for hearing loss and voice change.   Eyes: Negative for eye problems.  Respiratory: Negative for chest tightness and cough.   Cardiovascular: Negative for chest pain.  Gastrointestinal: Negative for abdominal distention, abdominal pain and blood in stool.  Endocrine: Negative for hot flashes.  Genitourinary: Negative for difficulty urinating and frequency.   Musculoskeletal: Negative for arthralgias.  Skin: Negative for itching and rash.  Neurological: Negative for extremity weakness.  Hematological: Negative for adenopathy.  Psychiatric/Behavioral: Negative for confusion.     MEDICAL HISTORY:  Past Medical History:  Diagnosis Date  . Asthma   . Cardiomyopathy, nonischemic ([place])   . CHF (congestive heart failure) ([place])   . COPD (chronic obstructive pulmonary disease) ([place])   . Diabetes mellitus, type 2 ([place])   . Gastric ulcer without hemorrhage or perforation   . GERD (gastroesophageal reflux disease)   . HTN (hypertension)   . Hyperlipidemia   . IDA (iron deficiency anemia)   . Obstructive sleep apnea on CPAP   . Osteoarthritis    osteo of both knees  . Scoliosis  SURGICAL HISTORY: Past Surgical History:  Procedure Laterality Date  . ABDOMINAL HYSTERECTOMY    . ankle (other)    . APPENDECTOMY    . BREAST BIOPSY Right 1999   neg  . BREAST CYST ASPIRATION     neg  . BREAST SURGERY    . CARDIAC CATHETERIZATION    . CLAVICLE SURGERY Right    Fx  . COLONOSCOPY WITH PROPOFOL N/A 07/10/2016   Procedure: COLONOSCOPY WITH PROPOFOL;  Surgeon: Lollie Sails, MD;  Location: [facility] ENDOSCOPY;  Service:  Endoscopy;  Laterality: N/A;  . ear (otheR)    . ESOPHAGOGASTRODUODENOSCOPY N/A 05/11/2019   Procedure: ESOPHAGOGASTRODUODENOSCOPY (EGD);  Surgeon: Toledo, Benay Pike, MD;  Location: [facility] ENDOSCOPY;  Service: Gastroenterology;  Laterality: N/A;  . ESOPHAGOGASTRODUODENOSCOPY (EGD) WITH PROPOFOL N/A 07/10/2016   Procedure: ESOPHAGOGASTRODUODENOSCOPY (EGD) WITH PROPOFOL;  Surgeon: Lollie Sails, MD;  Location: [facility] ENDOSCOPY;  Service: Endoscopy;  Laterality: N/A;  . ESOPHAGOGASTRODUODENOSCOPY (EGD) WITH PROPOFOL N/A 09/17/2017   Procedure: ESOPHAGOGASTRODUODENOSCOPY (EGD) WITH PROPOFOL;  Surgeon: Lollie Sails, MD;  Location: [facility] ENDOSCOPY;  Service: Endoscopy;  Laterality: N/A;  . feet (other)    . HERNIA REPAIR    . sinus (other)    . stomach    . UPPER ESOPHAGEAL ENDOSCOPIC ULTRASOUND (EUS) N/A 11/22/2016   Procedure: UPPER ESOPHAGEAL ENDOSCOPIC ULTRASOUND (EUS);  Surgeon: Reita Cliche, MD;  Location: [facility] ENDOSCOPY;  Service: Gastroenterology;  Laterality: N/A;    SOCIAL HISTORY: Social History   Socioeconomic History  . Marital status: Married    Spouse name: Not on file  . Number of children: Not on file  . Years of education: Not on file  . Highest education level: Not on file  Occupational History  . Occupation: retired  Tobacco Use  . Smoking status: Former Smoker    Packs/day: 1.00    Years: 15.00    Pack years: 15.00    Types: Cigarettes    Quit date: 07/21/1982    Years since quitting: 36.9  . Smokeless tobacco: Never Used  Substance and Sexual Activity  . Alcohol use: No    Alcohol/week: 15.0 standard drinks    Types: 15 Standard drinks or equivalent per week    Comment: quit drinking 04/1981  . Drug use: No  . Sexual activity: Not on file  Other Topics Concern  . Not on file  Social History Narrative   Disabled. Regularly exercises.    Social Determinants of Health   Financial Resource Strain:   . Difficulty of Paying Living Expenses:   Food  Insecurity:   . Worried About Charity fundraiser in the Last Year:   . Arboriculturist in the Last Year:   Transportation Needs:   . Film/video editor (Medical):   Marland Kitchen Lack of Transportation (Non-Medical):   Physical Activity:   . Days of Exercise per Week:   . Minutes of Exercise per Session:   Stress:   . Feeling of Stress :   Social Connections:   . Frequency of Communication with Friends and Family:   . Frequency of Social Gatherings with Friends and Family:   . Attends Religious Services:   . Active Member of Clubs or Organizations:   . Attends Archivist Meetings:   Marland Kitchen Marital Status:   Intimate Partner Violence:   . Fear of Current or Ex-Partner:   . Emotionally Abused:   Marland Kitchen Physically Abused:   . Sexually Abused:     FAMILY HISTORY: Family  History  Problem Relation Age of Onset  . Alzheimer's disease Other   . Heart attack Mother   . Hypertension Mother   . Cancer Brother        lung  . Cancer Maternal Aunt        mouth and breast  . Breast cancer Maternal Aunt   . Cancer Daughter        throat  . Dementia Father     ALLERGIES:  is allergic to amoxicillin; aspirin; celebrex [celecoxib]; latex; penicillins; and allegra [fexofenadine].  MEDICATIONS:  Current Outpatient Medications  Medication Sig Dispense Refill  . albuterol (PROVENTIL HFA;VENTOLIN HFA) 108 (90 BASE) MCG/ACT inhaler Inhale 2 puffs into the lungs every 6 (six) hours as needed for wheezing or shortness of breath.    Marland Kitchen aspirin 81 MG tablet Take 81 mg by mouth daily.    Marland Kitchen BIDIL 20-37.5 MG tablet TAKE 1 TABLET BY MOUTH THREE TIMES DAILY (Patient taking differently: Take 1 tablet by mouth 3 (three) times daily. ) 270 tablet 3  . Biotin 1000 MCG tablet Take 1,000 mcg by mouth daily.     . calcium carbonate (CALCIUM 600) 600 MG TABS tablet Take 600 mg by mouth 2 (two) times daily with a meal.     . cetirizine (ZYRTEC) 10 MG tablet Take 10 mg by mouth daily.    . cholecalciferol (VITAMIN  D) 400 units TABS tablet Take 2,000 Units by mouth daily.     . ciprofloxacin (CIPRO) 500 MG tablet Take 500 mg by mouth 2 (two) times daily.    Marland Kitchen Cod Liver Oil w/Vit A & D CAPS Take 1 capsule by mouth daily.    Marland Kitchen ENTRESTO 97-103 MG TAKE 1 TABLET BY MOUTH TWICE DAILY (Patient taking differently: Take 1 tablet by mouth 2 (two) times daily. ) 180 tablet 3  . fluticasone (FLONASE) 50 MCG/ACT nasal spray Place into the nose.    Marland Kitchen Fluticasone-Salmeterol (ADVAIR) 500-50 MCG/DOSE AEPB Inhale 1 puff into the lungs 2 (two) times daily.    . furosemide (LASIX) 40 MG tablet Take 40 mg by mouth daily.    Marland Kitchen glimepiride (AMARYL) 2 MG tablet Take 2 mg by mouth daily with breakfast.    . levocetirizine (XYZAL) 5 MG tablet Take 5 mg by mouth every evening.    . meloxicam (MOBIC) 7.5 MG tablet Take 7.5 mg by mouth 2 (two) times daily as needed for pain. Takes one every other day    . metFORMIN (GLUCOPHAGE) 500 MG tablet Take 1,000 mg by mouth 2 (two) times daily with a meal.    . metoprolol (TOPROL-XL) 200 MG 24 hr tablet TAKE 1 TABLET BY MOUTH ONCE DAILY. TAKE WITH OR IMMEDIATELY FOLLOWING A MEAL. (Patient taking differently: Take 200 mg by mouth daily. ) 90 tablet 3  . montelukast (SINGULAIR) 10 MG tablet Take 10 mg by mouth at bedtime.    . Multiple Vitamin (MULTIVITAMIN) tablet Take 1 tablet by mouth daily.     . Omega 3-6-9 Fatty Acids (TRIPLE OMEGA-3-6-9) CAPS Take 1 capsule by mouth daily.    . ondansetron (ZOFRAN-ODT) 4 MG disintegrating tablet Take 4 mg every 8 (eight) hours as needed by mouth for nausea or vomiting.    . pantoprazole (PROTONIX) 40 MG tablet Take 40 mg by mouth 2 (two) times daily.    . potassium chloride (K-DUR) 10 MEQ tablet TAKE 1 TABLET BY MOUTH ONCE DAILY (Patient taking differently: Take 10 mEq by mouth daily. ) 90 tablet 3  .  predniSONE (DELTASONE) 10 MG tablet Take by mouth.    . rosuvastatin (CRESTOR) 20 MG tablet Take 20 mg by mouth daily.    . Semaglutide,0.25 or 0.5MG /DOS,  (OZEMPIC, 0.25 OR 0.5 MG/DOSE,) 2 MG/1.5ML SOPN Inject 0.375 mLs into the skin once a week.    . sucralfate (CARAFATE) 1 g tablet Take by mouth.    . Tiotropium Bromide Monohydrate (SPIRIVA RESPIMAT) 1.25 MCG/ACT AERS Inhale 2 puffs into the lungs daily.    . traMADol-acetaminophen (ULTRACET) 37.5-325 MG tablet Take 1 tablet by mouth every 6 (six) hours as needed. Takes twice a day    . traZODone (DESYREL) 100 MG tablet Take 100 mg by mouth at bedtime.    . vitamin B-12 (CYANOCOBALAMIN) 500 MCG tablet Take 500 mcg daily by mouth.    . vitamin C (ASCORBIC ACID) 500 MG tablet Take 500 mg by mouth daily.     . vitamin E 400 UNIT capsule Take 400 Units by mouth daily.    . ferrous gluconate (FERGON) 324 MG tablet Take 324 mg by mouth daily with breakfast.    . omeprazole (PRILOSEC) 20 MG capsule Take 20 mg by mouth 2 (two) times daily before a meal.     No current facility-administered medications for this visit.     PHYSICAL EXAMINATION: ECOG PERFORMANCE STATUS: 1 - Symptomatic but completely ambulatory Vitals:   07/10/19 0854  BP: 129/77  Pulse: 83  Resp: 18  Temp: (!) 96.2 F (35.7 C)   Filed Weights   07/10/19 0854  Weight: 177 lb 3.2 oz (80.4 kg)    Physical Exam Constitutional:      General: She is not in acute distress. HENT:     Head: Normocephalic and atraumatic.  Eyes:     General: No scleral icterus. Cardiovascular:     Rate and Rhythm: Normal rate and regular rhythm.     Heart sounds: Normal heart sounds.  Pulmonary:     Effort: Pulmonary effort is normal. No respiratory distress.     Breath sounds: No wheezing.  Abdominal:     General: Bowel sounds are normal. There is no distension.     Palpations: Abdomen is soft.  Musculoskeletal:        General: No deformity. Normal range of motion.     Cervical back: Normal range of motion and neck supple.  Skin:    General: Skin is warm and dry.     Findings: No erythema or rash.  Neurological:     Mental Status:  She is alert and oriented to person, place, and time. Mental status is at baseline.     Cranial Nerves: No cranial nerve deficit.     Coordination: Coordination normal.  Psychiatric:        Mood and Affect: Mood normal.      LABORATORY DATA:  I have reviewed the data as listed Lab Results  Component Value Date   WBC 11.9 (H) 06/11/2019   HGB 11.3 (L) 06/11/2019   HCT 35.0 (L) 06/11/2019   MCV 94.6 06/11/2019   PLT 256 06/11/2019   Recent Labs    10/30/18 0301 10/30/18 0301 10/30/18 0851 10/30/18 0851 10/31/18 0551 06/11/19 1015  NA 140   < > 141  --  142 141  K 5.5*   < > 3.8  --  3.7 4.2  CL 105   < > 109  --  112* 101  CO2 25   < > 24  --  24 28  GLUCOSE  94   < > 94  --  82 90  BUN 21   < > 23  --  17 23  CREATININE 0.91   < > 1.02*  --  0.87 1.02*  CALCIUM 10.9*   < > 9.4   < > 8.6* 11.4*  12.0*  GFRNONAA >60   < > 54*  --  >60 54*  GFRAA >60   < > >60  --  >60 >60  PROT 7.2  --   --   --   --  6.8  ALBUMIN 4.5  --   --   --   --  4.0  AST 26  --   --   --   --  15  ALT 18  --   --   --   --  14  ALKPHOS 35*  --   --   --   --  41  BILITOT 1.6*  --   --   --   --  1.0   < > = values in this interval not displayed.   Iron/TIBC/Ferritin/ %Sat    Component Value Date/Time   IRON 62 06/11/2019 1015   TIBC 393 06/11/2019 1015   FERRITIN 12 06/11/2019 1015   IRONPCTSAT 16 06/11/2019 1015      ASSESSMENT & PLAN:  1. Leukocytosis, unspecified type   2. Iron deficiency anemia, unspecified iron deficiency anemia type   3. Hypercalcemia    Anemia:  Work-up showed ferritin is borderline low at 12. Patient cannot tolerate oral iron supplementation.  I discussed with her about option of IV Venofer.  She declines.  She prefers to take iron enriched food, ie. Iiver pudding and see if her level can be further improved.  #Hypercalcemia, monoclonal gammopathy evaluation was negative. Normal vitamin D 1, 25 dihydroxy level 25  hydroxy vitamin D level is nonelevated.   Slightly low. PTH is low normal-less likely due to hyperparathyroidism.  PTH rp is negative. Patient is on calcium supplementation which she was previously advised to stop which she did for short period time and self resumed. Advised patient to stop calcium supplementation and repeat blood work.  I offered her to repeat blood work in 2 weeks to trend her calcium level.  She prefers to repeat the blood work in 4 weeks.  Mild leukocytosis likely secondary to recent prednisone use.  Orders Placed This Encounter  Procedures  . Basic metabolic panel    Standing Status:   Future    Standing Expiration Date:   07/09/2020  . CBC with Differential/Platelet    Standing Status:   Future    Standing Expiration Date:   07/09/2020  . Comprehensive metabolic panel    Standing Status:   Future    Standing Expiration Date:   07/09/2020  . Ferritin    Standing Status:   Future    Standing Expiration Date:   07/09/2020  . Iron and TIBC    Standing Status:   Future    Standing Expiration Date:   07/09/2020    All questions were answered. The patient knows to call the clinic with any problems questions or concerns. Ollen Gross, MD  Return of visit: 3 months Earlie Server, MD, PhD 07/10/2019

## 2019-08-07 ENCOUNTER — Other Ambulatory Visit: Payer: Self-pay

## 2019-08-07 ENCOUNTER — Inpatient Hospital Stay: Payer: Medicare Other | Attending: Oncology

## 2019-08-07 DIAGNOSIS — D72829 Elevated white blood cell count, unspecified: Secondary | ICD-10-CM

## 2019-08-07 LAB — BASIC METABOLIC PANEL
Anion gap: 13 (ref 5–15)
BUN: 16 mg/dL (ref 8–23)
CO2: 28 mmol/L (ref 22–32)
Calcium: 9.1 mg/dL (ref 8.9–10.3)
Chloride: 98 mmol/L (ref 98–111)
Creatinine, Ser: 1.34 mg/dL — ABNORMAL HIGH (ref 0.44–1.00)
GFR calc Af Amer: 45 mL/min — ABNORMAL LOW (ref >60)
GFR calc non Af Amer: 39 mL/min — ABNORMAL LOW (ref >60)
Glucose, Bld: 273 mg/dL — ABNORMAL HIGH (ref 70–99)
Potassium: 2.8 mmol/L — ABNORMAL LOW (ref 3.5–5.1)
Sodium: 139 mmol/L (ref 135–145)

## 2019-08-13 ENCOUNTER — Telehealth: Payer: Self-pay

## 2019-08-13 NOTE — Telephone Encounter (Signed)
Patient called reporting that she was advised to stop taking calcium at follow up visit. She did stop taking calcium, but also stopped taking potassium (thinking it was her calcium pill too). Pt is currently taking potassium 10 meq daily, she is wondering if she should take 2 pill to bring up her potassium level.   Per result note on BMP, Dr. Tasia Catchings talked to patient and advised her to go to ER, but pt declined, so she advised her to contact PCP. I also advised pt to contact PCP and they will advise her on potassium dosage. Pt voiced understanding.

## 2019-10-08 ENCOUNTER — Other Ambulatory Visit: Payer: Self-pay

## 2019-10-08 ENCOUNTER — Inpatient Hospital Stay (HOSPITAL_BASED_OUTPATIENT_CLINIC_OR_DEPARTMENT_OTHER): Payer: Medicare Other | Admitting: Oncology

## 2019-10-08 ENCOUNTER — Inpatient Hospital Stay: Payer: Medicare Other | Attending: Oncology

## 2019-10-08 ENCOUNTER — Encounter: Payer: Self-pay | Admitting: Oncology

## 2019-10-08 VITALS — BP 154/80 | HR 92 | Temp 98.2°F | Wt 170.1 lb

## 2019-10-08 DIAGNOSIS — Z886 Allergy status to analgesic agent status: Secondary | ICD-10-CM | POA: Insufficient documentation

## 2019-10-08 DIAGNOSIS — D631 Anemia in chronic kidney disease: Secondary | ICD-10-CM | POA: Insufficient documentation

## 2019-10-08 DIAGNOSIS — R5382 Chronic fatigue, unspecified: Secondary | ICD-10-CM | POA: Diagnosis not present

## 2019-10-08 DIAGNOSIS — N1831 Chronic kidney disease, stage 3a: Secondary | ICD-10-CM

## 2019-10-08 DIAGNOSIS — Z79899 Other long term (current) drug therapy: Secondary | ICD-10-CM | POA: Insufficient documentation

## 2019-10-08 DIAGNOSIS — Z818 Family history of other mental and behavioral disorders: Secondary | ICD-10-CM | POA: Diagnosis not present

## 2019-10-08 DIAGNOSIS — Z801 Family history of malignant neoplasm of trachea, bronchus and lung: Secondary | ICD-10-CM | POA: Insufficient documentation

## 2019-10-08 DIAGNOSIS — Z9049 Acquired absence of other specified parts of digestive tract: Secondary | ICD-10-CM | POA: Diagnosis not present

## 2019-10-08 DIAGNOSIS — Z8249 Family history of ischemic heart disease and other diseases of the circulatory system: Secondary | ICD-10-CM | POA: Insufficient documentation

## 2019-10-08 DIAGNOSIS — M1990 Unspecified osteoarthritis, unspecified site: Secondary | ICD-10-CM | POA: Diagnosis not present

## 2019-10-08 DIAGNOSIS — Z803 Family history of malignant neoplasm of breast: Secondary | ICD-10-CM | POA: Insufficient documentation

## 2019-10-08 DIAGNOSIS — D72829 Elevated white blood cell count, unspecified: Secondary | ICD-10-CM

## 2019-10-08 DIAGNOSIS — Z880 Allergy status to penicillin: Secondary | ICD-10-CM | POA: Insufficient documentation

## 2019-10-08 DIAGNOSIS — Z8711 Personal history of peptic ulcer disease: Secondary | ICD-10-CM | POA: Insufficient documentation

## 2019-10-08 DIAGNOSIS — Z87891 Personal history of nicotine dependence: Secondary | ICD-10-CM | POA: Diagnosis not present

## 2019-10-08 DIAGNOSIS — D509 Iron deficiency anemia, unspecified: Secondary | ICD-10-CM | POA: Diagnosis not present

## 2019-10-08 LAB — COMPREHENSIVE METABOLIC PANEL
ALT: 12 U/L (ref 0–44)
AST: 16 U/L (ref 15–41)
Albumin: 4.3 g/dL (ref 3.5–5.0)
Alkaline Phosphatase: 39 U/L (ref 38–126)
Anion gap: 11 (ref 5–15)
BUN: 16 mg/dL (ref 8–23)
CO2: 26 mmol/L (ref 22–32)
Calcium: 9.2 mg/dL (ref 8.9–10.3)
Chloride: 104 mmol/L (ref 98–111)
Creatinine, Ser: 1.3 mg/dL — ABNORMAL HIGH (ref 0.44–1.00)
GFR calc Af Amer: 46 mL/min — ABNORMAL LOW (ref >60)
GFR calc non Af Amer: 40 mL/min — ABNORMAL LOW (ref >60)
Glucose, Bld: 129 mg/dL — ABNORMAL HIGH (ref 70–99)
Potassium: 3.5 mmol/L (ref 3.5–5.1)
Sodium: 141 mmol/L (ref 135–145)
Total Bilirubin: 0.7 mg/dL (ref 0.3–1.2)
Total Protein: 7.4 g/dL (ref 6.5–8.1)

## 2019-10-08 LAB — CBC WITH DIFFERENTIAL/PLATELET
Abs Immature Granulocytes: 0.01 10*3/uL (ref 0.00–0.07)
Basophils Absolute: 0 10*3/uL (ref 0.0–0.1)
Basophils Relative: 1 %
Eosinophils Absolute: 0.1 10*3/uL (ref 0.0–0.5)
Eosinophils Relative: 1 %
HCT: 31.3 % — ABNORMAL LOW (ref 36.0–46.0)
Hemoglobin: 10.2 g/dL — ABNORMAL LOW (ref 12.0–15.0)
Immature Granulocytes: 0 %
Lymphocytes Relative: 37 %
Lymphs Abs: 2.5 10*3/uL (ref 0.7–4.0)
MCH: 30 pg (ref 26.0–34.0)
MCHC: 32.6 g/dL (ref 30.0–36.0)
MCV: 92.1 fL (ref 80.0–100.0)
Monocytes Absolute: 0.4 10*3/uL (ref 0.1–1.0)
Monocytes Relative: 6 %
Neutro Abs: 3.7 10*3/uL (ref 1.7–7.7)
Neutrophils Relative %: 55 %
Platelets: 219 10*3/uL (ref 150–400)
RBC: 3.4 MIL/uL — ABNORMAL LOW (ref 3.87–5.11)
RDW: 13.1 % (ref 11.5–15.5)
WBC: 6.7 10*3/uL (ref 4.0–10.5)
nRBC: 0 % (ref 0.0–0.2)

## 2019-10-08 LAB — FERRITIN: Ferritin: 13 ng/mL (ref 11–307)

## 2019-10-08 LAB — IRON AND TIBC
Iron: 38 ug/dL (ref 28–170)
Saturation Ratios: 11 % (ref 10.4–31.8)
TIBC: 346 ug/dL (ref 250–450)
UIBC: 308 ug/dL

## 2019-10-08 MED ORDER — VITRON-C 65-125 MG PO TABS: 1.0000 | ORAL_TABLET | Freq: Every day | ORAL | 1 refills | Status: DC

## 2019-10-08 NOTE — Progress Notes (Signed)
Hematology/Oncology follow up note [facility] [address] [phone] [phone]) [phone]   Patient Care Team: Tracie Harrier, MD as PCP - General (Internal Medicine) Isaias Cowman, MD as Consulting Physician (Cardiology) Alisa Graff, FNP as Nurse Practitioner (Family Medicine) Earlie Server, MD as Consulting Physician (Oncology)  REFERRING PROVIDER: Tracie Harrier, MD  CHIEF COMPLAINTS/REASON FOR VISIT:  Anemia   HISTORY OF PRESENTING ILLNESS:  Diane Patton is a  74 y.o.  female with PMH listed below who was referred to me for evaluation of anemia Reviewed patient's recent labs that was done.  05/25/2019 labs revealed anemia with hemoglobin of 10.6 Reviewed patient's previous labs ordered by primary care physician's office, anemia is chronic onset , duration is since at least 2015.  Associated signs and symptoms: Patient reports fatigue.  Denies SOB with exertion.  Denies weight loss, easy bruising, hematochezia, hemoptysis, hematuria. Context: History of GI bleeding: Denies               History of Chronic kidney disease; denies               History of autoimmune disease: Denies               History of hemolytic anemia.  Denies               Last colonoscopy: March 2021 EGD showed submucosal gastric antrum mass with biopsy showed mild nonspecific chronic gastritis.  Negative for active inflammation and H. pylori.  Negative for dysplasia or malignancy.  May 2018 colonoscopy showed diverticulosis in the sigmoid colon, ascending and descending colon and transverse colon                Also noticed that patient had chronic calcium since April 2020.  Calcium levels at 11.3 on 05/25/2019. 05/25/2019, ferritin 8.  Folic acid> 35.3, vitamin B12 565.   INTERVAL HISTORY Diane Patton is a 74 y.o. female who has above history reviewed by me today presents for follow up visit for management of anemia and hypocalcemia. Problems and complaints are listed  below: Patient declined IV venofer treatments. She prefers to eat liver pudding sometime and think that should be good enough. Chronic fatigue is at baseline.   Review of Systems  Constitutional: Positive for fatigue. Negative for appetite change, chills and fever.  HENT:   Negative for hearing loss and voice change.   Eyes: Negative for eye problems.  Respiratory: Negative for chest tightness and cough.   Cardiovascular: Negative for chest pain.  Gastrointestinal: Negative for abdominal distention, abdominal pain and blood in stool.  Endocrine: Negative for hot flashes.  Genitourinary: Negative for difficulty urinating and frequency.   Musculoskeletal: Negative for arthralgias.  Skin: Negative for itching and rash.  Neurological: Negative for extremity weakness.  Hematological: Negative for adenopathy.  Psychiatric/Behavioral: Negative for confusion.     MEDICAL HISTORY:  Past Medical History:  Diagnosis Date  . Asthma   . Cardiomyopathy, nonischemic ([place])   . CHF (congestive heart failure) ([place])   . COPD (chronic obstructive pulmonary disease) ([place])   . Diabetes mellitus, type 2 ([place])   . Gastric ulcer without hemorrhage or perforation   . GERD (gastroesophageal reflux disease)   . HTN (hypertension)   . Hyperlipidemia   . IDA (iron deficiency anemia)   . Obstructive sleep apnea on CPAP   . Osteoarthritis    osteo of both knees  . Scoliosis     SURGICAL HISTORY: Past Surgical History:  Procedure Laterality Date  .  ABDOMINAL HYSTERECTOMY    . ankle (other)    . APPENDECTOMY    . BREAST BIOPSY Right 1999   neg  . BREAST CYST ASPIRATION     neg  . BREAST SURGERY    . CARDIAC CATHETERIZATION    . CLAVICLE SURGERY Right    Fx  . COLONOSCOPY WITH PROPOFOL N/A 07/10/2016   Procedure: COLONOSCOPY WITH PROPOFOL;  Surgeon: Lollie Sails, MD;  Location: [facility] ENDOSCOPY;  Service: Endoscopy;  Laterality: N/A;  . ear (otheR)    . ESOPHAGOGASTRODUODENOSCOPY N/A  05/11/2019   Procedure: ESOPHAGOGASTRODUODENOSCOPY (EGD);  Surgeon: Toledo, Benay Pike, MD;  Location: [facility] ENDOSCOPY;  Service: Gastroenterology;  Laterality: N/A;  . ESOPHAGOGASTRODUODENOSCOPY (EGD) WITH PROPOFOL N/A 07/10/2016   Procedure: ESOPHAGOGASTRODUODENOSCOPY (EGD) WITH PROPOFOL;  Surgeon: Lollie Sails, MD;  Location: [facility] ENDOSCOPY;  Service: Endoscopy;  Laterality: N/A;  . ESOPHAGOGASTRODUODENOSCOPY (EGD) WITH PROPOFOL N/A 09/17/2017   Procedure: ESOPHAGOGASTRODUODENOSCOPY (EGD) WITH PROPOFOL;  Surgeon: Lollie Sails, MD;  Location: [facility] ENDOSCOPY;  Service: Endoscopy;  Laterality: N/A;  . feet (other)    . HERNIA REPAIR    . sinus (other)    . stomach    . UPPER ESOPHAGEAL ENDOSCOPIC ULTRASOUND (EUS) N/A 11/22/2016   Procedure: UPPER ESOPHAGEAL ENDOSCOPIC ULTRASOUND (EUS);  Surgeon: Reita Cliche, MD;  Location: [facility] ENDOSCOPY;  Service: Gastroenterology;  Laterality: N/A;    SOCIAL HISTORY: Social History   Socioeconomic History  . Marital status: Married    Spouse name: Not on file  . Number of children: Not on file  . Years of education: Not on file  . Highest education level: Not on file  Occupational History  . Occupation: retired  Tobacco Use  . Smoking status: Former Smoker    Packs/day: 1.00    Years: 15.00    Pack years: 15.00    Types: Cigarettes    Quit date: 07/21/1982    Years since quitting: 37.2  . Smokeless tobacco: Never Used  Vaping Use  . Vaping Use: Never used  Substance and Sexual Activity  . Alcohol use: No    Alcohol/week: 15.0 standard drinks    Types: 15 Standard drinks or equivalent per week    Comment: quit drinking 04/1981  . Drug use: No  . Sexual activity: Not on file  Other Topics Concern  . Not on file  Social History Narrative   Disabled. Regularly exercises.    Social Determinants of Health   Financial Resource Strain:   . Difficulty of Paying Living Expenses: Not on file  Food Insecurity:   . Worried About  Charity fundraiser in the Last Year: Not on file  . Ran Out of Food in the Last Year: Not on file  Transportation Needs:   . Lack of Transportation (Medical): Not on file  . Lack of Transportation (Non-Medical): Not on file  Physical Activity:   . Days of Exercise per Week: Not on file  . Minutes of Exercise per Session: Not on file  Stress:   . Feeling of Stress : Not on file  Social Connections:   . Frequency of Communication with Friends and Family: Not on file  . Frequency of Social Gatherings with Friends and Family: Not on file  . Attends Religious Services: Not on file  . Active Member of Clubs or Organizations: Not on file  . Attends Archivist Meetings: Not on file  . Marital Status: Not on file  Intimate Partner Violence:   . Fear  of Current or Ex-Partner: Not on file  . Emotionally Abused: Not on file  . Physically Abused: Not on file  . Sexually Abused: Not on file    FAMILY HISTORY: Family History  Problem Relation Age of Onset  . Alzheimer's disease Other   . Heart attack Mother   . Hypertension Mother   . Cancer Brother        lung  . Cancer Maternal Aunt        mouth and breast  . Breast cancer Maternal Aunt   . Cancer Daughter        throat  . Dementia Father     ALLERGIES:  is allergic to amoxicillin, aspirin, celebrex [celecoxib], latex, penicillins, and allegra [fexofenadine].  MEDICATIONS:  Current Outpatient Medications  Medication Sig Dispense Refill  . albuterol (PROVENTIL HFA;VENTOLIN HFA) 108 (90 BASE) MCG/ACT inhaler Inhale 2 puffs into the lungs every 6 (six) hours as needed for wheezing or shortness of breath.    Marland Kitchen aspirin 81 MG tablet Take 81 mg by mouth daily.    Marland Kitchen BIDIL 20-37.5 MG tablet TAKE 1 TABLET BY MOUTH THREE TIMES DAILY (Patient taking differently: Take 1 tablet by mouth 3 (three) times daily. ) 270 tablet 3  . Biotin 1000 MCG tablet Take 1,000 mcg by mouth daily.     . calcium carbonate (CALCIUM 600) 600 MG TABS  tablet Take 600 mg by mouth 2 (two) times daily with a meal.     . cetirizine (ZYRTEC) 10 MG tablet Take 10 mg by mouth daily.    . cholecalciferol (VITAMIN D) 400 units TABS tablet Take 2,000 Units by mouth daily.     . ciprofloxacin (CIPRO) 500 MG tablet Take 500 mg by mouth 2 (two) times daily.    Marland Kitchen Cod Liver Oil w/Vit A & D CAPS Take 1 capsule by mouth daily.    Marland Kitchen ENTRESTO 97-103 MG TAKE 1 TABLET BY MOUTH TWICE DAILY (Patient taking differently: Take 1 tablet by mouth 2 (two) times daily. ) 180 tablet 3  . ferrous gluconate (FERGON) 324 MG tablet Take 324 mg by mouth daily with breakfast.    . fluticasone (FLONASE) 50 MCG/ACT nasal spray Place into the nose.    Marland Kitchen Fluticasone-Salmeterol (ADVAIR) 500-50 MCG/DOSE AEPB Inhale 1 puff into the lungs 2 (two) times daily.    . furosemide (LASIX) 40 MG tablet Take 40 mg by mouth daily.    Marland Kitchen glimepiride (AMARYL) 2 MG tablet Take 2 mg by mouth daily with breakfast.    . levocetirizine (XYZAL) 5 MG tablet Take 5 mg by mouth every evening.    . meloxicam (MOBIC) 7.5 MG tablet Take 7.5 mg by mouth 2 (two) times daily as needed for pain. Takes one every other day    . metFORMIN (GLUCOPHAGE) 500 MG tablet Take 1,000 mg by mouth 2 (two) times daily with a meal.    . metoprolol (TOPROL-XL) 200 MG 24 hr tablet TAKE 1 TABLET BY MOUTH ONCE DAILY. TAKE WITH OR IMMEDIATELY FOLLOWING A MEAL. (Patient taking differently: Take 200 mg by mouth daily. ) 90 tablet 3  . montelukast (SINGULAIR) 10 MG tablet Take 10 mg by mouth at bedtime.    . Multiple Vitamin (MULTIVITAMIN) tablet Take 1 tablet by mouth daily.     . Omega 3-6-9 Fatty Acids (TRIPLE OMEGA-3-6-9) CAPS Take 1 capsule by mouth daily.    Marland Kitchen omeprazole (PRILOSEC) 20 MG capsule Take 20 mg by mouth 2 (two) times daily before a meal.    .  ondansetron (ZOFRAN-ODT) 4 MG disintegrating tablet Take 4 mg every 8 (eight) hours as needed by mouth for nausea or vomiting.    . pantoprazole (PROTONIX) 40 MG tablet Take 40 mg  by mouth 2 (two) times daily.    . potassium chloride (K-DUR) 10 MEQ tablet TAKE 1 TABLET BY MOUTH ONCE DAILY (Patient taking differently: Take 10 mEq by mouth daily. ) 90 tablet 3  . predniSONE (DELTASONE) 10 MG tablet Take by mouth.    . rosuvastatin (CRESTOR) 20 MG tablet Take 20 mg by mouth daily.    . Semaglutide,0.25 or 0.5MG /DOS, (OZEMPIC, 0.25 OR 0.5 MG/DOSE,) 2 MG/1.5ML SOPN Inject 0.375 mLs into the skin once a week.    . sucralfate (CARAFATE) 1 g tablet Take by mouth.    . Tiotropium Bromide Monohydrate (SPIRIVA RESPIMAT) 1.25 MCG/ACT AERS Inhale 2 puffs into the lungs daily.    . traMADol-acetaminophen (ULTRACET) 37.5-325 MG tablet Take 1 tablet by mouth every 6 (six) hours as needed. Takes twice a day    . traZODone (DESYREL) 100 MG tablet Take 100 mg by mouth at bedtime.    . vitamin B-12 (CYANOCOBALAMIN) 500 MCG tablet Take 500 mcg daily by mouth.    . vitamin C (ASCORBIC ACID) 500 MG tablet Take 500 mg by mouth daily.     . vitamin E 400 UNIT capsule Take 400 Units by mouth daily.     No current facility-administered medications for this visit.     PHYSICAL EXAMINATION: ECOG PERFORMANCE STATUS: 1 - Symptomatic but completely ambulatory Vitals:   10/08/19 0927  BP: (!) 154/80  Pulse: 92  Temp: 98.2 F (36.8 C)  SpO2: 98%   Filed Weights   10/08/19 0927  Weight: 170 lb 1.6 oz (77.2 kg)    Physical Exam Constitutional:      General: She is not in acute distress. HENT:     Head: Normocephalic and atraumatic.  Eyes:     General: No scleral icterus. Cardiovascular:     Rate and Rhythm: Normal rate and regular rhythm.     Heart sounds: Normal heart sounds.  Pulmonary:     Effort: Pulmonary effort is normal. No respiratory distress.     Breath sounds: No wheezing.  Abdominal:     General: Bowel sounds are normal. There is no distension.     Palpations: Abdomen is soft.  Musculoskeletal:        General: No deformity. Normal range of motion.     Cervical back:  Normal range of motion and neck supple.  Skin:    General: Skin is warm and dry.     Findings: No erythema or rash.  Neurological:     Mental Status: She is alert and oriented to person, place, and time. Mental status is at baseline.     Cranial Nerves: No cranial nerve deficit.     Coordination: Coordination normal.  Psychiatric:        Mood and Affect: Mood normal.      LABORATORY DATA:  I have reviewed the data as listed Lab Results  Component Value Date   WBC 6.7 10/08/2019   HGB 10.2 (L) 10/08/2019   HCT 31.3 (L) 10/08/2019   MCV 92.1 10/08/2019   PLT 219 10/08/2019   Recent Labs    10/30/18 0301 10/30/18 0851 06/11/19 1015 08/07/19 1029 10/08/19 0907  NA 140   < > 141 139 141  K 5.5*   < > 4.2 2.8* 3.5  CL 105   < >  101 98 104  CO2 25   < > 28 28 26   GLUCOSE 94   < > 90 273* 129*  BUN 21   < > 23 16 16   CREATININE 0.91   < > 1.02* 1.34* 1.30*  CALCIUM 10.9*   < > 11.4*  12.0* 9.1 9.2  GFRNONAA >60   < > 54* 39* 40*  GFRAA >60   < > >60 45* 46*  PROT 7.2  --  6.8  --  7.4  ALBUMIN 4.5  --  4.0  --  4.3  AST 26  --  15  --  16  ALT 18  --  14  --  12  ALKPHOS 35*  --  41  --  39  BILITOT 1.6*  --  1.0  --  0.7   < > = values in this interval not displayed.   Iron/TIBC/Ferritin/ %Sat    Component Value Date/Time   IRON 38 10/08/2019 0907   TIBC 346 10/08/2019 0907   FERRITIN 13 10/08/2019 0907   IRONPCTSAT 11 10/08/2019 0907      ASSESSMENT & PLAN:  1. Iron deficiency anemia, unspecified iron deficiency anemia type   2. Anemia in stage 3a chronic kidney disease    Anemia due to CKD.  Work-up showed ferritin is borderline low at 13 I discussed with her and recommend IV venofer treatments. She declined.  Then I recommend her to try oral iron supplementation.  She was previously on iron gluconate which she cannot tolerate due to diarrhea.  We will switch to vitamin C.   Orders Placed This Encounter  Procedures  . Ferritin    Standing Status:    Future    Standing Expiration Date:   10/07/2020  . Iron and TIBC    Standing Status:   Future    Standing Expiration Date:   10/07/2020  . CBC with Differential/Platelet    Standing Status:   Future    Standing Expiration Date:   10/07/2020  . Comprehensive metabolic panel    Standing Status:   Future    Standing Expiration Date:   10/07/2020    All questions were answered. The patient knows to call the clinic with any problems questions or concerns. Ollen Gross, MD  Return of visit: 3 months Earlie Server, MD, PhD 10/08/2019

## 2019-10-08 NOTE — Progress Notes (Signed)
Pt here for follow up. No new concerns voiced.   

## 2020-01-06 ENCOUNTER — Inpatient Hospital Stay: Payer: Medicare Other | Attending: Oncology

## 2020-01-06 ENCOUNTER — Other Ambulatory Visit: Payer: Self-pay

## 2020-01-06 DIAGNOSIS — Z79899 Other long term (current) drug therapy: Secondary | ICD-10-CM | POA: Diagnosis not present

## 2020-01-06 DIAGNOSIS — Z8249 Family history of ischemic heart disease and other diseases of the circulatory system: Secondary | ICD-10-CM | POA: Diagnosis not present

## 2020-01-06 DIAGNOSIS — Z8711 Personal history of peptic ulcer disease: Secondary | ICD-10-CM | POA: Diagnosis not present

## 2020-01-06 DIAGNOSIS — Z7289 Other problems related to lifestyle: Secondary | ICD-10-CM | POA: Diagnosis not present

## 2020-01-06 DIAGNOSIS — Z880 Allergy status to penicillin: Secondary | ICD-10-CM | POA: Insufficient documentation

## 2020-01-06 DIAGNOSIS — Z9049 Acquired absence of other specified parts of digestive tract: Secondary | ICD-10-CM | POA: Insufficient documentation

## 2020-01-06 DIAGNOSIS — Z803 Family history of malignant neoplasm of breast: Secondary | ICD-10-CM | POA: Diagnosis not present

## 2020-01-06 DIAGNOSIS — Z886 Allergy status to analgesic agent status: Secondary | ICD-10-CM | POA: Insufficient documentation

## 2020-01-06 DIAGNOSIS — N1831 Chronic kidney disease, stage 3a: Secondary | ICD-10-CM | POA: Diagnosis not present

## 2020-01-06 DIAGNOSIS — D509 Iron deficiency anemia, unspecified: Secondary | ICD-10-CM

## 2020-01-06 DIAGNOSIS — Z818 Family history of other mental and behavioral disorders: Secondary | ICD-10-CM | POA: Diagnosis not present

## 2020-01-06 DIAGNOSIS — Z87891 Personal history of nicotine dependence: Secondary | ICD-10-CM | POA: Insufficient documentation

## 2020-01-06 DIAGNOSIS — D631 Anemia in chronic kidney disease: Secondary | ICD-10-CM | POA: Insufficient documentation

## 2020-01-06 DIAGNOSIS — E611 Iron deficiency: Secondary | ICD-10-CM | POA: Diagnosis not present

## 2020-01-06 DIAGNOSIS — Z801 Family history of malignant neoplasm of trachea, bronchus and lung: Secondary | ICD-10-CM | POA: Insufficient documentation

## 2020-01-06 LAB — CBC WITH DIFFERENTIAL/PLATELET
Abs Immature Granulocytes: 0.03 10*3/uL (ref 0.00–0.07)
Basophils Absolute: 0 10*3/uL (ref 0.0–0.1)
Basophils Relative: 1 %
Eosinophils Absolute: 0.1 10*3/uL (ref 0.0–0.5)
Eosinophils Relative: 2 %
HCT: 31.5 % — ABNORMAL LOW (ref 36.0–46.0)
Hemoglobin: 10.3 g/dL — ABNORMAL LOW (ref 12.0–15.0)
Immature Granulocytes: 1 %
Lymphocytes Relative: 42 %
Lymphs Abs: 2.8 10*3/uL (ref 0.7–4.0)
MCH: 29.7 pg (ref 26.0–34.0)
MCHC: 32.7 g/dL (ref 30.0–36.0)
MCV: 90.8 fL (ref 80.0–100.0)
Monocytes Absolute: 0.4 10*3/uL (ref 0.1–1.0)
Monocytes Relative: 6 %
Neutro Abs: 3.2 10*3/uL (ref 1.7–7.7)
Neutrophils Relative %: 48 %
Platelets: 227 10*3/uL (ref 150–400)
RBC: 3.47 MIL/uL — ABNORMAL LOW (ref 3.87–5.11)
RDW: 14 % (ref 11.5–15.5)
WBC: 6.5 10*3/uL (ref 4.0–10.5)
nRBC: 0 % (ref 0.0–0.2)

## 2020-01-06 LAB — COMPREHENSIVE METABOLIC PANEL
ALT: 13 U/L (ref 0–44)
AST: 16 U/L (ref 15–41)
Albumin: 4.1 g/dL (ref 3.5–5.0)
Alkaline Phosphatase: 37 U/L — ABNORMAL LOW (ref 38–126)
Anion gap: 10 (ref 5–15)
BUN: 16 mg/dL (ref 8–23)
CO2: 25 mmol/L (ref 22–32)
Calcium: 9.4 mg/dL (ref 8.9–10.3)
Chloride: 103 mmol/L (ref 98–111)
Creatinine, Ser: 1.18 mg/dL — ABNORMAL HIGH (ref 0.44–1.00)
GFR, Estimated: 48 mL/min — ABNORMAL LOW (ref >60)
Glucose, Bld: 100 mg/dL — ABNORMAL HIGH (ref 70–99)
Potassium: 3.7 mmol/L (ref 3.5–5.1)
Sodium: 138 mmol/L (ref 135–145)
Total Bilirubin: 0.6 mg/dL (ref 0.3–1.2)
Total Protein: 6.8 g/dL (ref 6.5–8.1)

## 2020-01-06 LAB — IRON AND TIBC
Iron: 51 ug/dL (ref 28–170)
Saturation Ratios: 15 % (ref 10.4–31.8)
TIBC: 347 ug/dL (ref 250–450)
UIBC: 296 ug/dL

## 2020-01-06 LAB — FERRITIN: Ferritin: 9 ng/mL — ABNORMAL LOW (ref 11–307)

## 2020-01-08 ENCOUNTER — Other Ambulatory Visit: Payer: Self-pay

## 2020-01-08 ENCOUNTER — Inpatient Hospital Stay (HOSPITAL_BASED_OUTPATIENT_CLINIC_OR_DEPARTMENT_OTHER): Payer: Medicare Other | Admitting: Oncology

## 2020-01-08 ENCOUNTER — Encounter: Payer: Self-pay | Admitting: Oncology

## 2020-01-08 VITALS — BP 118/59 | HR 84 | Temp 98.7°F | Resp 18 | Wt 171.9 lb

## 2020-01-08 DIAGNOSIS — D509 Iron deficiency anemia, unspecified: Secondary | ICD-10-CM

## 2020-01-08 DIAGNOSIS — N1831 Chronic kidney disease, stage 3a: Secondary | ICD-10-CM | POA: Diagnosis not present

## 2020-01-08 DIAGNOSIS — D631 Anemia in chronic kidney disease: Secondary | ICD-10-CM

## 2020-01-08 NOTE — Progress Notes (Signed)
Hematology/Oncology follow up note [facility] [phone] [phone]   Patient Care Team: Tracie Harrier, MD as PCP - General (Internal Medicine) Isaias Cowman, MD as Consulting Physician (Cardiology) Alisa Graff, FNP as Nurse Practitioner (Family Medicine) Earlie Server, MD as Consulting Physician (Oncology)  REFERRING PROVIDER: Tracie Harrier, MD  CHIEF COMPLAINTS/REASON FOR VISIT:  Anemia   HISTORY OF PRESENTING ILLNESS:  Diane Patton is a  74 y.o.  female with PMH listed below who was referred to me for evaluation of anemia Reviewed patient's recent labs that was done.  05/25/2019 labs revealed anemia with hemoglobin of 10.6 Reviewed patient's previous labs ordered by primary care physician's office, anemia is chronic onset , duration is since at least 2015.  Associated signs and symptoms: Patient reports fatigue.  Denies SOB with exertion.  Denies weight loss, easy bruising, hematochezia, hemoptysis, hematuria. Context: History of GI bleeding: Denies               History of Chronic kidney disease; denies               History of autoimmune disease: Denies               History of hemolytic anemia.  Denies               Last colonoscopy: March 2021 EGD showed submucosal gastric antrum mass with biopsy showed mild nonspecific chronic gastritis.  Negative for active inflammation and H. pylori.  Negative for dysplasia or malignancy.  May 2018 colonoscopy showed diverticulosis in the sigmoid colon, ascending and descending colon and transverse colon                Also noticed that patient had chronic calcium since April 2020.  Calcium levels at 11.3 on 05/25/2019. 05/25/2019, ferritin 8.  Folic acid> 50.0, vitamin B12 565.   INTERVAL HISTORY Diane Patton is a 74 y.o. female who has above history reviewed by me today presents for follow up visit for management of anemia and hypocalcemia. Problems and complaints are listed  below: Patient declined IV venofer treatments.  Patient takes oral iron supplementation and tolerates well.  She reports feeling well.  No new complaints.  Denies any blood in the stool.  Review of Systems  Constitutional: Positive for fatigue. Negative for appetite change, chills and fever.  HENT:   Negative for hearing loss and voice change.   Eyes: Negative for eye problems.  Respiratory: Negative for chest tightness and cough.   Cardiovascular: Negative for chest pain.  Gastrointestinal: Negative for abdominal distention, abdominal pain and blood in stool.  Endocrine: Negative for hot flashes.  Genitourinary: Negative for difficulty urinating and frequency.   Musculoskeletal: Negative for arthralgias.  Skin: Negative for itching and rash.  Neurological: Negative for extremity weakness.  Hematological: Negative for adenopathy.  Psychiatric/Behavioral: Negative for confusion.     MEDICAL HISTORY:  Past Medical History:  Diagnosis Date  . Asthma   . Cardiomyopathy, nonischemic ([place])   . CHF (congestive heart failure) ([place])   . COPD (chronic obstructive pulmonary disease) ([place])   . Diabetes mellitus, type 2 ([place])   . Gastric ulcer without hemorrhage or perforation   . GERD (gastroesophageal reflux disease)   . HTN (hypertension)   . Hyperlipidemia   . IDA (iron deficiency anemia)   . Obstructive sleep apnea on CPAP   . Osteoarthritis    osteo of both knees  . Scoliosis     SURGICAL HISTORY: Past Surgical History:  Procedure Laterality Date  . ABDOMINAL HYSTERECTOMY    . ankle (other)    . APPENDECTOMY    . BREAST BIOPSY Right 1999   neg  . BREAST CYST ASPIRATION     neg  . BREAST SURGERY    . CARDIAC CATHETERIZATION    . CLAVICLE SURGERY Right    Fx  . COLONOSCOPY WITH PROPOFOL N/A 07/10/2016   Procedure: COLONOSCOPY WITH PROPOFOL;  Surgeon: Lollie Sails, MD;  Location: [facility] ENDOSCOPY;  Service: Endoscopy;  Laterality: N/A;  . ear (otheR)    .  ESOPHAGOGASTRODUODENOSCOPY N/A 05/11/2019   Procedure: ESOPHAGOGASTRODUODENOSCOPY (EGD);  Surgeon: Toledo, Benay Pike, MD;  Location: [facility] ENDOSCOPY;  Service: Gastroenterology;  Laterality: N/A;  . ESOPHAGOGASTRODUODENOSCOPY (EGD) WITH PROPOFOL N/A 07/10/2016   Procedure: ESOPHAGOGASTRODUODENOSCOPY (EGD) WITH PROPOFOL;  Surgeon: Lollie Sails, MD;  Location: [facility] ENDOSCOPY;  Service: Endoscopy;  Laterality: N/A;  . ESOPHAGOGASTRODUODENOSCOPY (EGD) WITH PROPOFOL N/A 09/17/2017   Procedure: ESOPHAGOGASTRODUODENOSCOPY (EGD) WITH PROPOFOL;  Surgeon: Lollie Sails, MD;  Location: [facility] ENDOSCOPY;  Service: Endoscopy;  Laterality: N/A;  . feet (other)    . HERNIA REPAIR    . sinus (other)    . stomach    . UPPER ESOPHAGEAL ENDOSCOPIC ULTRASOUND (EUS) N/A 11/22/2016   Procedure: UPPER ESOPHAGEAL ENDOSCOPIC ULTRASOUND (EUS);  Surgeon: Reita Cliche, MD;  Location: [facility] ENDOSCOPY;  Service: Gastroenterology;  Laterality: N/A;    SOCIAL HISTORY: Social History   Socioeconomic History  . Marital status: Married    Spouse name: Not on file  . Number of children: Not on file  . Years of education: Not on file  . Highest education level: Not on file  Occupational History  . Occupation: retired  Tobacco Use  . Smoking status: Former Smoker    Packs/day: 1.00    Years: 15.00    Pack years: 15.00    Types: Cigarettes    Quit date: 07/21/1982    Years since quitting: 37.4  . Smokeless tobacco: Never Used  Vaping Use  . Vaping Use: Never used  Substance and Sexual Activity  . Alcohol use: No    Alcohol/week: 15.0 standard drinks    Types: 15 Standard drinks or equivalent per week    Comment: quit drinking 04/1981  . Drug use: No  . Sexual activity: Not on file  Other Topics Concern  . Not on file  Social History Narrative   Disabled. Regularly exercises.    Social Determinants of Health   Financial Resource Strain:   . Difficulty of Paying Living Expenses: Not on file  Food  Insecurity:   . Worried About Charity fundraiser in the Last Year: Not on file  . Ran Out of Food in the Last Year: Not on file  Transportation Needs:   . Lack of Transportation (Medical): Not on file  . Lack of Transportation (Non-Medical): Not on file  Physical Activity:   . Days of Exercise per Week: Not on file  . Minutes of Exercise per Session: Not on file  Stress:   . Feeling of Stress : Not on file  Social Connections:   . Frequency of Communication with Friends and Family: Not on file  . Frequency of Social Gatherings with Friends and Family: Not on file  . Attends Religious Services: Not on file  . Active Member of Clubs or Organizations: Not on file  . Attends Archivist Meetings: Not on file  . Marital Status: Not on file  Intimate Partner  Violence:   . Fear of Current or Ex-Partner: Not on file  . Emotionally Abused: Not on file  . Physically Abused: Not on file  . Sexually Abused: Not on file    FAMILY HISTORY: Family History  Problem Relation Age of Onset  . Alzheimer's disease Other   . Heart attack Mother   . Hypertension Mother   . Cancer Brother        lung  . Cancer Maternal Aunt        mouth and breast  . Breast cancer Maternal Aunt   . Cancer Daughter        throat  . Dementia Father     ALLERGIES:  is allergic to amoxicillin, aspirin, celebrex [celecoxib], latex, penicillins, and allegra [fexofenadine].  MEDICATIONS:  Current Outpatient Medications  Medication Sig Dispense Refill  . albuterol (PROVENTIL HFA;VENTOLIN HFA) 108 (90 BASE) MCG/ACT inhaler Inhale 2 puffs into the lungs every 6 (six) hours as needed for wheezing or shortness of breath.    Marland Kitchen aspirin 81 MG tablet Take 81 mg by mouth daily.    Marland Kitchen BIDIL 20-37.5 MG tablet TAKE 1 TABLET BY MOUTH THREE TIMES DAILY (Patient taking differently: Take 1 tablet by mouth 3 (three) times daily. ) 270 tablet 3  . Biotin 1000 MCG tablet Take 1,000 mcg by mouth daily.     . calcium carbonate  (CALCIUM 600) 600 MG TABS tablet Take 600 mg by mouth 2 (two) times daily with a meal.     . cetirizine (ZYRTEC) 10 MG tablet Take 10 mg by mouth daily.    . cholecalciferol (VITAMIN D) 400 units TABS tablet Take 2,000 Units by mouth daily.     Marland Kitchen Cod Liver Oil w/Vit A & D CAPS Take 1 capsule by mouth daily.    Marland Kitchen ENTRESTO 97-103 MG TAKE 1 TABLET BY MOUTH TWICE DAILY (Patient taking differently: Take 1 tablet by mouth 2 (two) times daily. ) 180 tablet 3  . fluticasone (FLONASE) 50 MCG/ACT nasal spray Place into the nose.    Marland Kitchen Fluticasone-Salmeterol (ADVAIR) 500-50 MCG/DOSE AEPB Inhale 1 puff into the lungs 2 (two) times daily.    . furosemide (LASIX) 40 MG tablet Take 40 mg by mouth daily.    Marland Kitchen glimepiride (AMARYL) 2 MG tablet Take 2 mg by mouth daily with breakfast.    . Iron-Vitamin C (VITRON-C) 65-125 MG TABS Take 1 tablet by mouth daily. 90 tablet 1  . levocetirizine (XYZAL) 5 MG tablet Take 5 mg by mouth every evening.    . meloxicam (MOBIC) 7.5 MG tablet Take 7.5 mg by mouth 2 (two) times daily as needed for pain. Takes one every other day    . metFORMIN (GLUCOPHAGE) 500 MG tablet Take 1,000 mg by mouth 2 (two) times daily with a meal.    . metoprolol (TOPROL-XL) 200 MG 24 hr tablet TAKE 1 TABLET BY MOUTH ONCE DAILY. TAKE WITH OR IMMEDIATELY FOLLOWING A MEAL. (Patient taking differently: Take 200 mg by mouth daily. ) 90 tablet 3  . montelukast (SINGULAIR) 10 MG tablet Take 10 mg by mouth at bedtime.    . Multiple Vitamin (MULTIVITAMIN) tablet Take 1 tablet by mouth daily.     . Omega 3-6-9 Fatty Acids (TRIPLE OMEGA-3-6-9) CAPS Take 1 capsule by mouth daily.    Marland Kitchen omeprazole (PRILOSEC) 20 MG capsule Take 20 mg by mouth 2 (two) times daily before a meal.    . ondansetron (ZOFRAN-ODT) 4 MG disintegrating tablet Take 4 mg every  8 (eight) hours as needed by mouth for nausea or vomiting.    . pantoprazole (PROTONIX) 40 MG tablet Take 40 mg by mouth 2 (two) times daily.    . potassium chloride  (K-DUR) 10 MEQ tablet TAKE 1 TABLET BY MOUTH ONCE DAILY (Patient taking differently: Take 10 mEq by mouth daily. ) 90 tablet 3  . predniSONE (DELTASONE) 10 MG tablet Take by mouth.    . rosuvastatin (CRESTOR) 20 MG tablet Take 20 mg by mouth daily.    . sucralfate (CARAFATE) 1 g tablet Take by mouth.    . Tiotropium Bromide Monohydrate (SPIRIVA RESPIMAT) 1.25 MCG/ACT AERS Inhale 2 puffs into the lungs daily.    . traMADol-acetaminophen (ULTRACET) 37.5-325 MG tablet Take 1 tablet by mouth every 6 (six) hours as needed. Takes twice a day    . traZODone (DESYREL) 100 MG tablet Take 100 mg by mouth at bedtime.    . vitamin B-12 (CYANOCOBALAMIN) 500 MCG tablet Take 500 mcg daily by mouth.    . vitamin C (ASCORBIC ACID) 500 MG tablet Take 500 mg by mouth daily.     . vitamin E 400 UNIT capsule Take 400 Units by mouth daily.    . ciprofloxacin (CIPRO) 500 MG tablet Take 500 mg by mouth 2 (two) times daily. (Patient not taking: Reported on 01/08/2020)    . Semaglutide,0.25 or 0.5MG /DOS, (OZEMPIC, 0.25 OR 0.5 MG/DOSE,) 2 MG/1.5ML SOPN Inject 0.375 mLs into the skin once a week. (Patient not taking: Reported on 01/08/2020)     No current facility-administered medications for this visit.     PHYSICAL EXAMINATION: ECOG PERFORMANCE STATUS: 1 - Symptomatic but completely ambulatory Vitals:   01/08/20 1005  BP: (!) 118/59  Pulse: 84  Resp: 18  Temp: 98.7 F (37.1 C)   Filed Weights   01/08/20 1005  Weight: 171 lb 14.4 oz (78 kg)    Physical Exam Constitutional:      General: She is not in acute distress. HENT:     Head: Normocephalic and atraumatic.  Eyes:     General: No scleral icterus. Cardiovascular:     Rate and Rhythm: Normal rate and regular rhythm.     Heart sounds: Normal heart sounds.  Pulmonary:     Effort: Pulmonary effort is normal. No respiratory distress.     Breath sounds: No wheezing.  Abdominal:     General: Bowel sounds are normal. There is no distension.      Palpations: Abdomen is soft.  Musculoskeletal:        General: No deformity. Normal range of motion.     Cervical back: Normal range of motion and neck supple.  Skin:    General: Skin is warm and dry.     Findings: No erythema or rash.  Neurological:     Mental Status: She is alert and oriented to person, place, and time. Mental status is at baseline.     Cranial Nerves: No cranial nerve deficit.     Coordination: Coordination normal.  Psychiatric:        Mood and Affect: Mood normal.      LABORATORY DATA:  I have reviewed the data as listed Lab Results  Component Value Date   WBC 6.5 01/06/2020   HGB 10.3 (L) 01/06/2020   HCT 31.5 (L) 01/06/2020   MCV 90.8 01/06/2020   PLT 227 01/06/2020   Recent Labs    06/11/19 1015 06/11/19 1015 08/07/19 1029 10/08/19 0907 01/06/20 1054  NA 141   < >  139 141 138  K 4.2   < > 2.8* 3.5 3.7  CL 101   < > 98 104 103  CO2 28   < > 28 26 25   GLUCOSE 90   < > 273* 129* 100*  BUN 23   < > 16 16 16   CREATININE 1.02*   < > 1.34* 1.30* 1.18*  CALCIUM 11.4*  12.0*   < > 9.1 9.2 9.4  GFRNONAA 54*   < > 39* 40* 48*  GFRAA >60  --  45* 46*  --   PROT 6.8  --   --  7.4 6.8  ALBUMIN 4.0  --   --  4.3 4.1  AST 15  --   --  16 16  ALT 14  --   --  12 13  ALKPHOS 41  --   --  39 37*  BILITOT 1.0  --   --  0.7 0.6   < > = values in this interval not displayed.   Iron/TIBC/Ferritin/ %Sat    Component Value Date/Time   IRON 51 01/06/2020 1054   TIBC 347 01/06/2020 1054   FERRITIN 9 (L) 01/06/2020 1054   IRONPCTSAT 15 01/06/2020 1054      ASSESSMENT & PLAN:  1. Iron deficiency anemia, unspecified iron deficiency anemia type   2. Anemia in stage 3a chronic kidney disease (HCC)    Anemia due to CKD.  And iron deficiency anemia Labs are reviewed and discussed with patient. Hemoglobin stays stable at 10.3 today. Iron panel showed further decrease of ferritin to 9. Discussed with patient about recommendation of IV Venofer  treatment Allergy reactions/infusion reaction including anaphylactic reaction discussed with patient. Other side effects include but not limited to high blood pressure, skin rash, weight gain, leg swelling, etc. .Patient appreciates the explanation and she prefers to continue oral iron supplementation for another 3 months. If there is no further improvement, she may agree to IV Venofer treatments  Continue Vitron-C   Orders Placed This Encounter  Procedures  . CBC with Differential/Platelet    Standing Status:   Future    Standing Expiration Date:   01/07/2021  . Ferritin    Standing Status:   Future    Standing Expiration Date:   01/07/2021  . Iron and TIBC    Standing Status:   Future    Standing Expiration Date:   01/07/2021    All questions were answered. The patient knows to call the clinic with any problems questions or concerns. Ollen Gross, MD  Return of visit: 3 months Earlie Server, MD, PhD 01/08/2020

## 2020-01-08 NOTE — Progress Notes (Signed)
Patient denies new problems/concerns today.   °

## 2020-03-17 ENCOUNTER — Other Ambulatory Visit: Payer: Self-pay | Admitting: Internal Medicine

## 2020-03-17 DIAGNOSIS — Z1231 Encounter for screening mammogram for malignant neoplasm of breast: Secondary | ICD-10-CM

## 2020-04-05 ENCOUNTER — Other Ambulatory Visit: Payer: Self-pay | Admitting: Oncology

## 2020-04-05 ENCOUNTER — Encounter: Payer: Self-pay | Admitting: Oncology

## 2020-04-05 DIAGNOSIS — D509 Iron deficiency anemia, unspecified: Secondary | ICD-10-CM | POA: Insufficient documentation

## 2020-04-05 HISTORY — DX: Iron deficiency anemia, unspecified: D50.9

## 2020-04-06 ENCOUNTER — Inpatient Hospital Stay: Payer: Medicare Other | Attending: Oncology

## 2020-04-06 DIAGNOSIS — Z808 Family history of malignant neoplasm of other organs or systems: Secondary | ICD-10-CM | POA: Insufficient documentation

## 2020-04-06 DIAGNOSIS — D509 Iron deficiency anemia, unspecified: Secondary | ICD-10-CM

## 2020-04-06 DIAGNOSIS — Z803 Family history of malignant neoplasm of breast: Secondary | ICD-10-CM | POA: Insufficient documentation

## 2020-04-06 DIAGNOSIS — Z87891 Personal history of nicotine dependence: Secondary | ICD-10-CM | POA: Diagnosis not present

## 2020-04-06 DIAGNOSIS — Z801 Family history of malignant neoplasm of trachea, bronchus and lung: Secondary | ICD-10-CM | POA: Insufficient documentation

## 2020-04-06 DIAGNOSIS — D649 Anemia, unspecified: Secondary | ICD-10-CM | POA: Insufficient documentation

## 2020-04-06 DIAGNOSIS — Z8249 Family history of ischemic heart disease and other diseases of the circulatory system: Secondary | ICD-10-CM | POA: Diagnosis not present

## 2020-04-06 DIAGNOSIS — Z818 Family history of other mental and behavioral disorders: Secondary | ICD-10-CM | POA: Insufficient documentation

## 2020-04-06 DIAGNOSIS — Z7289 Other problems related to lifestyle: Secondary | ICD-10-CM | POA: Insufficient documentation

## 2020-04-06 DIAGNOSIS — Z79899 Other long term (current) drug therapy: Secondary | ICD-10-CM | POA: Diagnosis not present

## 2020-04-06 DIAGNOSIS — R5383 Other fatigue: Secondary | ICD-10-CM | POA: Diagnosis not present

## 2020-04-06 LAB — CBC WITH DIFFERENTIAL/PLATELET
Abs Immature Granulocytes: 0.02 10*3/uL (ref 0.00–0.07)
Basophils Absolute: 0 10*3/uL (ref 0.0–0.1)
Basophils Relative: 0 %
Eosinophils Absolute: 0.1 10*3/uL (ref 0.0–0.5)
Eosinophils Relative: 2 %
HCT: 29.6 % — ABNORMAL LOW (ref 36.0–46.0)
Hemoglobin: 9.5 g/dL — ABNORMAL LOW (ref 12.0–15.0)
Immature Granulocytes: 0 %
Lymphocytes Relative: 39 %
Lymphs Abs: 2.7 10*3/uL (ref 0.7–4.0)
MCH: 30.7 pg (ref 26.0–34.0)
MCHC: 32.1 g/dL (ref 30.0–36.0)
MCV: 95.8 fL (ref 80.0–100.0)
Monocytes Absolute: 0.4 10*3/uL (ref 0.1–1.0)
Monocytes Relative: 7 %
Neutro Abs: 3.5 10*3/uL (ref 1.7–7.7)
Neutrophils Relative %: 52 %
Platelets: 181 10*3/uL (ref 150–400)
RBC: 3.09 MIL/uL — ABNORMAL LOW (ref 3.87–5.11)
RDW: 14.6 % (ref 11.5–15.5)
WBC: 6.8 10*3/uL (ref 4.0–10.5)
nRBC: 0 % (ref 0.0–0.2)

## 2020-04-06 LAB — IRON AND TIBC
Iron: 47 ug/dL (ref 28–170)
Saturation Ratios: 15 % (ref 10.4–31.8)
TIBC: 319 ug/dL (ref 250–450)
UIBC: 272 ug/dL

## 2020-04-06 LAB — FERRITIN: Ferritin: 19 ng/mL (ref 11–307)

## 2020-04-08 ENCOUNTER — Inpatient Hospital Stay (HOSPITAL_BASED_OUTPATIENT_CLINIC_OR_DEPARTMENT_OTHER): Payer: Medicare Other | Admitting: Oncology

## 2020-04-08 ENCOUNTER — Encounter: Payer: Self-pay | Admitting: Oncology

## 2020-04-08 ENCOUNTER — Inpatient Hospital Stay: Payer: Medicare Other

## 2020-04-08 VITALS — BP 104/50 | HR 74

## 2020-04-08 VITALS — BP 112/54 | HR 72 | Temp 97.2°F | Resp 18 | Wt 171.7 lb

## 2020-04-08 DIAGNOSIS — D631 Anemia in chronic kidney disease: Secondary | ICD-10-CM

## 2020-04-08 DIAGNOSIS — N1831 Chronic kidney disease, stage 3a: Secondary | ICD-10-CM | POA: Diagnosis not present

## 2020-04-08 DIAGNOSIS — D509 Iron deficiency anemia, unspecified: Secondary | ICD-10-CM | POA: Diagnosis not present

## 2020-04-08 DIAGNOSIS — D649 Anemia, unspecified: Secondary | ICD-10-CM | POA: Diagnosis not present

## 2020-04-08 MED ORDER — SODIUM CHLORIDE 0.9 % IV SOLN
Freq: Once | INTRAVENOUS | Status: AC
  Filled 2020-04-08: qty 250

## 2020-04-08 MED ORDER — SODIUM CHLORIDE 0.9 % IV SOLN: 200.0000 mg | Freq: Once | INTRAVENOUS | Status: DC

## 2020-04-08 MED ORDER — IRON SUCROSE 20 MG/ML IV SOLN
200.0000 mg | Freq: Once | INTRAVENOUS | Status: AC
  Administered 2020-04-08: 200 mg via INTRAVENOUS
  Filled 2020-04-08: qty 10

## 2020-04-08 NOTE — Progress Notes (Signed)
Patient here for follow up. No new concerns voiced.  °

## 2020-04-08 NOTE — Progress Notes (Signed)
Hematology/Oncology follow up note [facility] [phone] [phone]   Patient Care Team: Tracie Harrier, MD as PCP - General (Internal Medicine) Isaias Cowman, MD as Consulting Physician (Cardiology) Alisa Graff, FNP as Nurse Practitioner (Family Medicine) Earlie Server, MD as Consulting Physician (Oncology)  REFERRING PROVIDER: Tracie Harrier, MD  CHIEF COMPLAINTS/REASON FOR VISIT:  Anemia   HISTORY OF PRESENTING ILLNESS:  Diane Patton is a  75 y.o.  female with PMH listed below who was referred to me for evaluation of anemia Reviewed patient's recent labs that was done.  05/25/2019 labs revealed anemia with hemoglobin of 10.6 Reviewed patient's previous labs ordered by primary care physician's office, anemia is chronic onset , duration is since at least 2015.  Associated signs and symptoms: Patient reports fatigue.  Denies SOB with exertion.  Denies weight loss, easy bruising, hematochezia, hemoptysis, hematuria. Context: History of GI bleeding: Denies               History of Chronic kidney disease; denies               History of autoimmune disease: Denies               History of hemolytic anemia.  Denies               Last colonoscopy: March 2021 EGD showed submucosal gastric antrum mass with biopsy showed mild nonspecific chronic gastritis.  Negative for active inflammation and H. pylori.  Negative for dysplasia or malignancy.  May 2018 colonoscopy showed diverticulosis in the sigmoid colon, ascending and descending colon and transverse colon                Also noticed that patient had chronic calcium since April 2020.  Calcium levels at 11.3 on 05/25/2019. 05/25/2019, ferritin 8.  Folic acid> 19.1, vitamin B12 565.   INTERVAL HISTORY Diane Patton is a 75 y.o. female who has above history reviewed by me today presents for follow up visit for management of anemia and hypocalcemia. Problems and complaints are listed  below: Patient declined IV venofer treatments.  Patient takes oral iron supplementation.  She feels well.  Review of Systems  Constitutional: Positive for fatigue. Negative for appetite change, chills and fever.  HENT:   Negative for hearing loss and voice change.   Eyes: Negative for eye problems.  Respiratory: Negative for chest tightness and cough.   Cardiovascular: Negative for chest pain.  Gastrointestinal: Negative for abdominal distention, abdominal pain and blood in stool.  Endocrine: Negative for hot flashes.  Genitourinary: Negative for difficulty urinating and frequency.   Musculoskeletal: Negative for arthralgias.  Skin: Negative for itching and rash.  Neurological: Negative for extremity weakness.  Hematological: Negative for adenopathy.  Psychiatric/Behavioral: Negative for confusion.     MEDICAL HISTORY:  Past Medical History:  Diagnosis Date  . Asthma   . Cardiomyopathy, nonischemic ([place])   . CHF (congestive heart failure) ([place])   . COPD (chronic obstructive pulmonary disease) ([place])   . Diabetes mellitus, type 2 ([place])   . Gastric ulcer without hemorrhage or perforation   . GERD (gastroesophageal reflux disease)   . HTN (hypertension)   . Hyperlipidemia   . IDA (iron deficiency anemia)   . IDA (iron deficiency anemia) 04/05/2020  . Obstructive sleep apnea on CPAP   . Osteoarthritis    osteo of both knees  . Scoliosis     SURGICAL HISTORY: Past Surgical History:  Procedure Laterality Date  . ABDOMINAL HYSTERECTOMY    .  ankle (other)    . APPENDECTOMY    . BREAST BIOPSY Right 1999   neg  . BREAST CYST ASPIRATION     neg  . BREAST SURGERY    . CARDIAC CATHETERIZATION    . CLAVICLE SURGERY Right    Fx  . COLONOSCOPY WITH PROPOFOL N/A 07/10/2016   Procedure: COLONOSCOPY WITH PROPOFOL;  Surgeon: Lollie Sails, MD;  Location: [facility] ENDOSCOPY;  Service: Endoscopy;  Laterality: N/A;  . ear (otheR)    . ESOPHAGOGASTRODUODENOSCOPY N/A 05/11/2019    Procedure: ESOPHAGOGASTRODUODENOSCOPY (EGD);  Surgeon: Toledo, Benay Pike, MD;  Location: [facility] ENDOSCOPY;  Service: Gastroenterology;  Laterality: N/A;  . ESOPHAGOGASTRODUODENOSCOPY (EGD) WITH PROPOFOL N/A 07/10/2016   Procedure: ESOPHAGOGASTRODUODENOSCOPY (EGD) WITH PROPOFOL;  Surgeon: Lollie Sails, MD;  Location: [facility] ENDOSCOPY;  Service: Endoscopy;  Laterality: N/A;  . ESOPHAGOGASTRODUODENOSCOPY (EGD) WITH PROPOFOL N/A 09/17/2017   Procedure: ESOPHAGOGASTRODUODENOSCOPY (EGD) WITH PROPOFOL;  Surgeon: Lollie Sails, MD;  Location: [facility] ENDOSCOPY;  Service: Endoscopy;  Laterality: N/A;  . feet (other)    . HERNIA REPAIR    . sinus (other)    . stomach    . UPPER ESOPHAGEAL ENDOSCOPIC ULTRASOUND (EUS) N/A 11/22/2016   Procedure: UPPER ESOPHAGEAL ENDOSCOPIC ULTRASOUND (EUS);  Surgeon: Reita Cliche, MD;  Location: [facility] ENDOSCOPY;  Service: Gastroenterology;  Laterality: N/A;    SOCIAL HISTORY: Social History   Socioeconomic History  . Marital status: Married    Spouse name: Not on file  . Number of children: Not on file  . Years of education: Not on file  . Highest education level: Not on file  Occupational History  . Occupation: retired  Tobacco Use  . Smoking status: Former Smoker    Packs/day: 1.00    Years: 15.00    Pack years: 15.00    Types: Cigarettes    Quit date: 07/21/1982    Years since quitting: 37.7  . Smokeless tobacco: Never Used  Vaping Use  . Vaping Use: Never used  Substance and Sexual Activity  . Alcohol use: No    Alcohol/week: 15.0 standard drinks    Types: 15 Standard drinks or equivalent per week    Comment: quit drinking 04/1981  . Drug use: No  . Sexual activity: Not on file  Other Topics Concern  . Not on file  Social History Narrative   Disabled. Regularly exercises.    Social Determinants of Health   Financial Resource Strain: Not on file  Food Insecurity: Not on file  Transportation Needs: Not on file  Physical Activity: Not on  file  Stress: Not on file  Social Connections: Not on file  Intimate Partner Violence: Not on file    FAMILY HISTORY: Family History  Problem Relation Age of Onset  . Alzheimer's disease Other   . Heart attack Mother   . Hypertension Mother   . Cancer Brother        lung  . Cancer Maternal Aunt        mouth and breast  . Breast cancer Maternal Aunt   . Cancer Daughter        throat  . Dementia Father     ALLERGIES:  is allergic to amoxicillin, aspirin, celebrex [celecoxib], latex, penicillins, and allegra [fexofenadine].  MEDICATIONS:  Current Outpatient Medications  Medication Sig Dispense Refill  . albuterol (PROVENTIL HFA;VENTOLIN HFA) 108 (90 BASE) MCG/ACT inhaler Inhale 2 puffs into the lungs every 6 (six) hours as needed for wheezing or shortness of breath.    Marland Kitchen  aspirin 81 MG tablet Take 81 mg by mouth daily.    Marland Kitchen BIDIL 20-37.5 MG tablet TAKE 1 TABLET BY MOUTH THREE TIMES DAILY (Patient taking differently: Take 1 tablet by mouth 3 (three) times daily.) 270 tablet 3  . Biotin 1000 MCG tablet Take 1,000 mcg by mouth daily.     . calcium carbonate (OS-CAL) 600 MG TABS tablet Take 600 mg by mouth 2 (two) times daily with a meal.     . cetirizine (ZYRTEC) 10 MG tablet Take 10 mg by mouth daily.    . cholecalciferol (VITAMIN D) 400 units TABS tablet Take 2,000 Units by mouth daily.     . ciprofloxacin (CIPRO) 500 MG tablet Take 500 mg by mouth 2 (two) times daily.    Marland Kitchen Cod Liver Oil w/Vit A & D CAPS Take 1 capsule by mouth daily.    Marland Kitchen ENTRESTO 97-103 MG TAKE 1 TABLET BY MOUTH TWICE DAILY (Patient taking differently: Take 1 tablet by mouth 2 (two) times daily.) 180 tablet 3  . Fluticasone-Salmeterol (ADVAIR) 500-50 MCG/DOSE AEPB Inhale 1 puff into the lungs 2 (two) times daily.    . furosemide (LASIX) 40 MG tablet Take 40 mg by mouth daily.    Marland Kitchen glimepiride (AMARYL) 2 MG tablet Take 2 mg by mouth daily with breakfast.    . Iron-Vitamin C (VITRON-C) 65-125 MG TABS Take 1 tablet  by mouth daily. 90 tablet 1  . levocetirizine (XYZAL) 5 MG tablet Take 5 mg by mouth every evening.    . meloxicam (MOBIC) 7.5 MG tablet Take 7.5 mg by mouth 2 (two) times daily as needed for pain. Takes one every other day    . metFORMIN (GLUCOPHAGE) 500 MG tablet Take 1,000 mg by mouth 2 (two) times daily with a meal.    . metoprolol (TOPROL-XL) 200 MG 24 hr tablet TAKE 1 TABLET BY MOUTH ONCE DAILY. TAKE WITH OR IMMEDIATELY FOLLOWING A MEAL. (Patient taking differently: Take 200 mg by mouth daily.) 90 tablet 3  . montelukast (SINGULAIR) 10 MG tablet Take 10 mg by mouth at bedtime.    . Multiple Vitamin (MULTIVITAMIN) tablet Take 1 tablet by mouth daily.     . Omega 3-6-9 Fatty Acids (TRIPLE OMEGA-3-6-9) CAPS Take 1 capsule by mouth daily.    Marland Kitchen omeprazole (PRILOSEC) 20 MG capsule Take 20 mg by mouth 2 (two) times daily before a meal.    . ondansetron (ZOFRAN-ODT) 4 MG disintegrating tablet Take 4 mg every 8 (eight) hours as needed by mouth for nausea or vomiting.    . pantoprazole (PROTONIX) 40 MG tablet Take 40 mg by mouth 2 (two) times daily.    . potassium chloride (K-DUR) 10 MEQ tablet TAKE 1 TABLET BY MOUTH ONCE DAILY (Patient taking differently: Take 10 mEq by mouth daily.) 90 tablet 3  . rosuvastatin (CRESTOR) 20 MG tablet Take 20 mg by mouth daily.    . Tiotropium Bromide Monohydrate 1.25 MCG/ACT AERS Inhale 2 puffs into the lungs daily.    . traMADol-acetaminophen (ULTRACET) 37.5-325 MG tablet Take 1 tablet by mouth every 6 (six) hours as needed. Takes twice a day    . traZODone (DESYREL) 100 MG tablet Take 100 mg by mouth at bedtime.    . vitamin B-12 (CYANOCOBALAMIN) 500 MCG tablet Take 500 mcg daily by mouth.    . vitamin C (ASCORBIC ACID) 500 MG tablet Take 500 mg by mouth daily.     . vitamin E 400 UNIT capsule Take 400 Units by  mouth daily.    . fluticasone (FLONASE) 50 MCG/ACT nasal spray Place into the nose.    . predniSONE (DELTASONE) 10 MG tablet Take by mouth. (Patient not  taking: Reported on 04/08/2020)    . Semaglutide,0.25 or 0.5MG /DOS, (OZEMPIC, 0.25 OR 0.5 MG/DOSE,) 2 MG/1.5ML SOPN Inject 0.375 mLs into the skin once a week. (Patient not taking: Reported on 04/08/2020)     No current facility-administered medications for this visit.     PHYSICAL EXAMINATION: ECOG PERFORMANCE STATUS: 1 - Symptomatic but completely ambulatory Vitals:   04/08/20 1339  BP: (!) 112/54  Pulse: 72  Resp: 18  Temp: (!) 97.2 F (36.2 C)   Filed Weights   04/08/20 1339  Weight: 171 lb 11.2 oz (77.9 kg)    Physical Exam Constitutional:      General: She is not in acute distress. HENT:     Head: Normocephalic and atraumatic.  Eyes:     General: No scleral icterus. Cardiovascular:     Rate and Rhythm: Normal rate and regular rhythm.     Heart sounds: Normal heart sounds.  Pulmonary:     Effort: Pulmonary effort is normal. No respiratory distress.     Breath sounds: No wheezing.  Abdominal:     General: Bowel sounds are normal. There is no distension.     Palpations: Abdomen is soft.  Musculoskeletal:        General: No deformity. Normal range of motion.     Cervical back: Normal range of motion and neck supple.  Skin:    General: Skin is warm and dry.     Findings: No erythema or rash.  Neurological:     Mental Status: She is alert and oriented to person, place, and time. Mental status is at baseline.     Cranial Nerves: No cranial nerve deficit.     Coordination: Coordination normal.  Psychiatric:        Mood and Affect: Mood normal.      LABORATORY DATA:  I have reviewed the data as listed Lab Results  Component Value Date   WBC 6.8 04/06/2020   HGB 9.5 (L) 04/06/2020   HCT 29.6 (L) 04/06/2020   MCV 95.8 04/06/2020   PLT 181 04/06/2020   Recent Labs    06/11/19 1015 08/07/19 1029 10/08/19 0907 01/06/20 1054  NA 141 139 141 138  K 4.2 2.8* 3.5 3.7  CL 101 98 104 103  CO2 28 28 26 25   GLUCOSE 90 273* 129* 100*  BUN 23 16 16 16    CREATININE 1.02* 1.34* 1.30* 1.18*  CALCIUM 11.4*  12.0* 9.1 9.2 9.4  GFRNONAA 54* 39* 40* 48*  GFRAA >60 45* 46*  --   PROT 6.8  --  7.4 6.8  ALBUMIN 4.0  --  4.3 4.1  AST 15  --  16 16  ALT 14  --  12 13  ALKPHOS 41  --  39 37*  BILITOT 1.0  --  0.7 0.6   Iron/TIBC/Ferritin/ %Sat    Component Value Date/Time   IRON 47 04/06/2020 1015   TIBC 319 04/06/2020 1015   FERRITIN 19 04/06/2020 1015   IRONPCTSAT 15 04/06/2020 1015      ASSESSMENT & PLAN:  1. Iron deficiency anemia, unspecified iron deficiency anemia type   2. Anemia in stage 3a chronic kidney disease (HCC)    Anemia due to CKD.  And iron deficiency anemia Labs are reviewed and discussed with patient. Iron panel showed improved ferritin and  iron saturation .  Hemoglobin has further decreased to below 10. Discussed with patient again about IV Venofer treatments to further improve her iron stores Plan IV iron with Venofer 200mg  weekly x 4 doses. Allergy reactions/infusion reaction including anaphylactic reaction discussed with patient. Other side effects include but not limited to high blood pressure, skin rash, weight gain, leg swelling, etc. Patient voices understanding and willing to proceed.  If no improvement despite IV Venofer treatments.  Will consider to start erythropoietin replacement therapy.  Follow-up in 6 weeks.  Orders Placed This Encounter  Procedures  . CBC with Differential/Platelet    Standing Status:   Future    Standing Expiration Date:   04/08/2021  . Ferritin    Standing Status:   Future    Standing Expiration Date:   04/08/2021  . Iron and TIBC    Standing Status:   Future    Standing Expiration Date:   04/08/2021    All questions were answered. The patient knows to call the clinic with any problems questions or concerns. Ollen Gross, MD  Earlie Server, MD, PhD 04/08/2020

## 2020-04-14 ENCOUNTER — Inpatient Hospital Stay: Payer: Medicare Other

## 2020-04-14 VITALS — BP 124/51 | HR 79 | Temp 98.0°F

## 2020-04-14 DIAGNOSIS — D649 Anemia, unspecified: Secondary | ICD-10-CM | POA: Diagnosis not present

## 2020-04-14 DIAGNOSIS — D509 Iron deficiency anemia, unspecified: Secondary | ICD-10-CM

## 2020-04-14 MED ORDER — SODIUM CHLORIDE 0.9 % IV SOLN: 200.0000 mg | Freq: Once | INTRAVENOUS | Status: DC

## 2020-04-14 MED ORDER — SODIUM CHLORIDE 0.9 % IV SOLN
Freq: Once | INTRAVENOUS | Status: AC
  Filled 2020-04-14: qty 250

## 2020-04-14 MED ORDER — IRON SUCROSE 20 MG/ML IV SOLN
200.0000 mg | Freq: Once | INTRAVENOUS | Status: AC
  Administered 2020-04-14: 200 mg via INTRAVENOUS
  Filled 2020-04-14: qty 10

## 2020-04-22 ENCOUNTER — Inpatient Hospital Stay: Payer: Medicare Other | Attending: Oncology

## 2020-04-22 VITALS — BP 110/68 | HR 90 | Resp 18

## 2020-04-22 DIAGNOSIS — Z801 Family history of malignant neoplasm of trachea, bronchus and lung: Secondary | ICD-10-CM | POA: Insufficient documentation

## 2020-04-22 DIAGNOSIS — D631 Anemia in chronic kidney disease: Secondary | ICD-10-CM | POA: Insufficient documentation

## 2020-04-22 DIAGNOSIS — Z7289 Other problems related to lifestyle: Secondary | ICD-10-CM | POA: Insufficient documentation

## 2020-04-22 DIAGNOSIS — Z79899 Other long term (current) drug therapy: Secondary | ICD-10-CM | POA: Diagnosis not present

## 2020-04-22 DIAGNOSIS — Z8711 Personal history of peptic ulcer disease: Secondary | ICD-10-CM | POA: Diagnosis not present

## 2020-04-22 DIAGNOSIS — D509 Iron deficiency anemia, unspecified: Secondary | ICD-10-CM

## 2020-04-22 DIAGNOSIS — E611 Iron deficiency: Secondary | ICD-10-CM | POA: Insufficient documentation

## 2020-04-22 DIAGNOSIS — Z87891 Personal history of nicotine dependence: Secondary | ICD-10-CM | POA: Insufficient documentation

## 2020-04-22 DIAGNOSIS — Z886 Allergy status to analgesic agent status: Secondary | ICD-10-CM | POA: Diagnosis not present

## 2020-04-22 DIAGNOSIS — Z8249 Family history of ischemic heart disease and other diseases of the circulatory system: Secondary | ICD-10-CM | POA: Insufficient documentation

## 2020-04-22 DIAGNOSIS — R5383 Other fatigue: Secondary | ICD-10-CM | POA: Insufficient documentation

## 2020-04-22 DIAGNOSIS — Z880 Allergy status to penicillin: Secondary | ICD-10-CM | POA: Diagnosis not present

## 2020-04-22 DIAGNOSIS — Z818 Family history of other mental and behavioral disorders: Secondary | ICD-10-CM | POA: Diagnosis not present

## 2020-04-22 DIAGNOSIS — Z803 Family history of malignant neoplasm of breast: Secondary | ICD-10-CM | POA: Insufficient documentation

## 2020-04-22 DIAGNOSIS — N1831 Chronic kidney disease, stage 3a: Secondary | ICD-10-CM | POA: Diagnosis not present

## 2020-04-22 DIAGNOSIS — Z9049 Acquired absence of other specified parts of digestive tract: Secondary | ICD-10-CM | POA: Diagnosis not present

## 2020-04-22 MED ORDER — SODIUM CHLORIDE 0.9 % IV SOLN: 200.0000 mg | Freq: Once | INTRAVENOUS | Status: DC

## 2020-04-22 MED ORDER — SODIUM CHLORIDE 0.9 % IV SOLN
Freq: Once | INTRAVENOUS | Status: AC
  Filled 2020-04-22: qty 250

## 2020-04-22 MED ORDER — IRON SUCROSE 20 MG/ML IV SOLN
200.0000 mg | Freq: Once | INTRAVENOUS | Status: AC
  Administered 2020-04-22: 200 mg via INTRAVENOUS
  Filled 2020-04-22: qty 10

## 2020-04-22 NOTE — Progress Notes (Signed)
Pt received prescribed treatment in clinic, pt stable at d/c. 

## 2020-04-29 ENCOUNTER — Inpatient Hospital Stay: Payer: Medicare Other

## 2020-04-29 VITALS — BP 109/53 | HR 86 | Temp 97.0°F | Resp 18

## 2020-04-29 DIAGNOSIS — N1831 Chronic kidney disease, stage 3a: Secondary | ICD-10-CM | POA: Diagnosis not present

## 2020-04-29 DIAGNOSIS — D509 Iron deficiency anemia, unspecified: Secondary | ICD-10-CM

## 2020-04-29 MED ORDER — IRON SUCROSE 20 MG/ML IV SOLN
200.0000 mg | Freq: Once | INTRAVENOUS | Status: AC
  Administered 2020-04-29: 200 mg via INTRAVENOUS
  Filled 2020-04-29: qty 10

## 2020-04-29 MED ORDER — SODIUM CHLORIDE 0.9 % IV SOLN: 200.0000 mg | Freq: Once | INTRAVENOUS | Status: DC

## 2020-04-29 MED ORDER — SODIUM CHLORIDE 0.9 % IV SOLN
Freq: Once | INTRAVENOUS | Status: AC
  Filled 2020-04-29: qty 250

## 2020-04-29 NOTE — Progress Notes (Signed)
Pt received IV venofer. VSS @ d/c.

## 2020-05-13 ENCOUNTER — Inpatient Hospital Stay: Payer: Medicare Other

## 2020-05-13 DIAGNOSIS — N1831 Chronic kidney disease, stage 3a: Secondary | ICD-10-CM | POA: Diagnosis not present

## 2020-05-13 DIAGNOSIS — D509 Iron deficiency anemia, unspecified: Secondary | ICD-10-CM

## 2020-05-13 LAB — IRON AND TIBC
Iron: 67 ug/dL (ref 28–170)
Saturation Ratios: 23 % (ref 10.4–31.8)
TIBC: 294 ug/dL (ref 250–450)
UIBC: 227 ug/dL

## 2020-05-13 LAB — FERRITIN: Ferritin: 174 ng/mL (ref 11–307)

## 2020-05-13 LAB — CBC WITH DIFFERENTIAL/PLATELET
Abs Immature Granulocytes: 0.02 10*3/uL (ref 0.00–0.07)
Basophils Absolute: 0 10*3/uL (ref 0.0–0.1)
Basophils Relative: 1 %
Eosinophils Absolute: 0.2 10*3/uL (ref 0.0–0.5)
Eosinophils Relative: 2 %
HCT: 30.5 % — ABNORMAL LOW (ref 36.0–46.0)
Hemoglobin: 9.9 g/dL — ABNORMAL LOW (ref 12.0–15.0)
Immature Granulocytes: 0 %
Lymphocytes Relative: 42 %
Lymphs Abs: 3.2 10*3/uL (ref 0.7–4.0)
MCH: 31.4 pg (ref 26.0–34.0)
MCHC: 32.5 g/dL (ref 30.0–36.0)
MCV: 96.8 fL (ref 80.0–100.0)
Monocytes Absolute: 0.4 10*3/uL (ref 0.1–1.0)
Monocytes Relative: 6 %
Neutro Abs: 3.7 10*3/uL (ref 1.7–7.7)
Neutrophils Relative %: 49 %
Platelets: 224 10*3/uL (ref 150–400)
RBC: 3.15 MIL/uL — ABNORMAL LOW (ref 3.87–5.11)
RDW: 14.6 % (ref 11.5–15.5)
WBC: 7.5 10*3/uL (ref 4.0–10.5)
nRBC: 0 % (ref 0.0–0.2)

## 2020-05-16 ENCOUNTER — Inpatient Hospital Stay: Payer: Medicare Other

## 2020-05-16 ENCOUNTER — Encounter: Payer: Self-pay | Admitting: Oncology

## 2020-05-16 ENCOUNTER — Inpatient Hospital Stay (HOSPITAL_BASED_OUTPATIENT_CLINIC_OR_DEPARTMENT_OTHER): Payer: Medicare Other | Admitting: Oncology

## 2020-05-16 VITALS — BP 113/51 | HR 80 | Temp 99.0°F | Resp 18

## 2020-05-16 VITALS — BP 145/62 | HR 85 | Temp 99.0°F | Resp 16 | Wt 170.4 lb

## 2020-05-16 DIAGNOSIS — D631 Anemia in chronic kidney disease: Secondary | ICD-10-CM | POA: Diagnosis not present

## 2020-05-16 DIAGNOSIS — N1831 Chronic kidney disease, stage 3a: Secondary | ICD-10-CM

## 2020-05-16 DIAGNOSIS — D509 Iron deficiency anemia, unspecified: Secondary | ICD-10-CM

## 2020-05-16 MED ORDER — SODIUM CHLORIDE 0.9 % IV SOLN
Freq: Once | INTRAVENOUS | Status: AC
  Filled 2020-05-16: qty 250

## 2020-05-16 MED ORDER — IRON SUCROSE 20 MG/ML IV SOLN
200.0000 mg | Freq: Once | INTRAVENOUS | Status: AC
  Administered 2020-05-16: 200 mg via INTRAVENOUS
  Filled 2020-05-16: qty 10

## 2020-05-16 MED ORDER — SODIUM CHLORIDE 0.9 % IV SOLN: 200.0000 mg | Freq: Once | INTRAVENOUS | Status: DC

## 2020-05-16 NOTE — Progress Notes (Signed)
Hematology/Oncology follow up note [facility] [phone] [phone]) [phone]   Patient Care Team: Tracie Harrier, MD as PCP - General (Internal Medicine) Isaias Cowman, MD as Consulting Physician (Cardiology) Alisa Graff, FNP as Nurse Practitioner (Family Medicine) Earlie Server, MD as Consulting Physician (Oncology)  REFERRING PROVIDER: Tracie Harrier, MD  CHIEF COMPLAINTS/REASON FOR VISIT:  Anemia   HISTORY OF PRESENTING ILLNESS:  Diane Patton is a  75 y.o.  female with PMH listed below who was referred to me for evaluation of anemia Reviewed patient's recent labs that was done.  05/25/2019 labs revealed anemia with hemoglobin of 10.6 Reviewed patient's previous labs ordered by primary care physician's office, anemia is chronic onset , duration is since at least 2015.  Associated signs and symptoms: Patient reports fatigue.  Denies SOB with exertion.  Denies weight loss, easy bruising, hematochezia, hemoptysis, hematuria. Context: History of GI bleeding: Denies               History of Chronic kidney disease; denies               History of autoimmune disease: Denies               History of hemolytic anemia.  Denies               Last colonoscopy: March 2021 EGD showed submucosal gastric antrum mass with biopsy showed mild nonspecific chronic gastritis.  Negative for active inflammation and H. pylori.  Negative for dysplasia or malignancy.  May 2018 colonoscopy showed diverticulosis in the sigmoid colon, ascending and descending colon and transverse colon                Also noticed that patient had chronic calcium since April 2020.  Calcium levels at 11.3 on 05/25/2019. 05/25/2019, ferritin 8.  Folic acid> 40.3, vitamin B12 565.   INTERVAL HISTORY Diane Patton is a 75 y.o. female who has above history reviewed by me today presents for follow up visit for management of anemia and hypocalcemia. Problems and complaints are listed  below: Patient received for IV iron treatments. She reports fatigue is slightly better. No new complaints. Review of Systems  Constitutional: Positive for fatigue. Negative for appetite change, chills and fever.  HENT:   Negative for hearing loss and voice change.   Eyes: Negative for eye problems.  Respiratory: Negative for chest tightness and cough.   Cardiovascular: Negative for chest pain.  Gastrointestinal: Negative for abdominal distention, abdominal pain and blood in stool.  Endocrine: Negative for hot flashes.  Genitourinary: Negative for difficulty urinating and frequency.   Musculoskeletal: Negative for arthralgias.  Skin: Negative for itching and rash.  Neurological: Negative for extremity weakness.  Hematological: Negative for adenopathy.  Psychiatric/Behavioral: Negative for confusion.     MEDICAL HISTORY:  Past Medical History:  Diagnosis Date  . Asthma   . Cardiomyopathy, nonischemic ([place])   . CHF (congestive heart failure) ([place])   . COPD (chronic obstructive pulmonary disease) ([place])   . Diabetes mellitus, type 2 ([place])   . Gastric ulcer without hemorrhage or perforation   . GERD (gastroesophageal reflux disease)   . HTN (hypertension)   . Hyperlipidemia   . IDA (iron deficiency anemia)   . IDA (iron deficiency anemia) 04/05/2020  . Obstructive sleep apnea on CPAP   . Osteoarthritis    osteo of both knees  . Scoliosis     SURGICAL HISTORY: Past Surgical History:  Procedure Laterality Date  . ABDOMINAL HYSTERECTOMY    .  ankle (other)    . APPENDECTOMY    . BREAST BIOPSY Right 1999   neg  . BREAST CYST ASPIRATION     neg  . BREAST SURGERY    . CARDIAC CATHETERIZATION    . CLAVICLE SURGERY Right    Fx  . COLONOSCOPY WITH PROPOFOL N/A 07/10/2016   Procedure: COLONOSCOPY WITH PROPOFOL;  Surgeon: Lollie Sails, MD;  Location: [facility] ENDOSCOPY;  Service: Endoscopy;  Laterality: N/A;  . ear (otheR)    . ESOPHAGOGASTRODUODENOSCOPY N/A 05/11/2019    Procedure: ESOPHAGOGASTRODUODENOSCOPY (EGD);  Surgeon: Toledo, Benay Pike, MD;  Location: [facility] ENDOSCOPY;  Service: Gastroenterology;  Laterality: N/A;  . ESOPHAGOGASTRODUODENOSCOPY (EGD) WITH PROPOFOL N/A 07/10/2016   Procedure: ESOPHAGOGASTRODUODENOSCOPY (EGD) WITH PROPOFOL;  Surgeon: Lollie Sails, MD;  Location: [facility] ENDOSCOPY;  Service: Endoscopy;  Laterality: N/A;  . ESOPHAGOGASTRODUODENOSCOPY (EGD) WITH PROPOFOL N/A 09/17/2017   Procedure: ESOPHAGOGASTRODUODENOSCOPY (EGD) WITH PROPOFOL;  Surgeon: Lollie Sails, MD;  Location: [facility] ENDOSCOPY;  Service: Endoscopy;  Laterality: N/A;  . feet (other)    . HERNIA REPAIR    . sinus (other)    . stomach    . UPPER ESOPHAGEAL ENDOSCOPIC ULTRASOUND (EUS) N/A 11/22/2016   Procedure: UPPER ESOPHAGEAL ENDOSCOPIC ULTRASOUND (EUS);  Surgeon: Reita Cliche, MD;  Location: [facility] ENDOSCOPY;  Service: Gastroenterology;  Laterality: N/A;    SOCIAL HISTORY: Social History   Socioeconomic History  . Marital status: Married    Spouse name: Not on file  . Number of children: Not on file  . Years of education: Not on file  . Highest education level: Not on file  Occupational History  . Occupation: retired  Tobacco Use  . Smoking status: Former Smoker    Packs/day: 1.00    Years: 15.00    Pack years: 15.00    Types: Cigarettes    Quit date: 07/21/1982    Years since quitting: 37.8  . Smokeless tobacco: Never Used  Vaping Use  . Vaping Use: Never used  Substance and Sexual Activity  . Alcohol use: No    Alcohol/week: 15.0 standard drinks    Types: 15 Standard drinks or equivalent per week    Comment: quit drinking 04/1981  . Drug use: No  . Sexual activity: Not on file  Other Topics Concern  . Not on file  Social History Narrative   Disabled. Regularly exercises.    Social Determinants of Health   Financial Resource Strain: Not on file  Food Insecurity: Not on file  Transportation Needs: Not on file  Physical Activity: Not on  file  Stress: Not on file  Social Connections: Not on file  Intimate Partner Violence: Not on file    FAMILY HISTORY: Family History  Problem Relation Age of Onset  . Alzheimer's disease Other   . Heart attack Mother   . Hypertension Mother   . Cancer Brother        lung  . Cancer Maternal Aunt        mouth and breast  . Breast cancer Maternal Aunt   . Cancer Daughter        throat  . Dementia Father     ALLERGIES:  is allergic to amoxicillin, aspirin, celebrex [celecoxib], latex, penicillins, and allegra [fexofenadine].  MEDICATIONS:  Current Outpatient Medications  Medication Sig Dispense Refill  . albuterol (PROVENTIL HFA;VENTOLIN HFA) 108 (90 BASE) MCG/ACT inhaler Inhale 2 puffs into the lungs every 6 (six) hours as needed for wheezing or shortness of breath.    Marland Kitchen  aspirin 81 MG tablet Take 81 mg by mouth daily.    Marland Kitchen BIDIL 20-37.5 MG tablet TAKE 1 TABLET BY MOUTH THREE TIMES DAILY (Patient taking differently: Take 1 tablet by mouth 3 (three) times daily.) 270 tablet 3  . Biotin 1000 MCG tablet Take 1,000 mcg by mouth daily.     . calcium carbonate (OS-CAL) 600 MG TABS tablet Take 600 mg by mouth 2 (two) times daily with a meal.     . cetirizine (ZYRTEC) 10 MG tablet Take 10 mg by mouth daily.    . cholecalciferol (VITAMIN D) 400 units TABS tablet Take 2,000 Units by mouth daily.     Marland Kitchen Cod Liver Oil w/Vit A & D CAPS Take 1 capsule by mouth daily.    Marland Kitchen ENTRESTO 97-103 MG TAKE 1 TABLET BY MOUTH TWICE DAILY (Patient taking differently: Take 1 tablet by mouth 2 (two) times daily.) 180 tablet 3  . fluticasone (FLONASE) 50 MCG/ACT nasal spray Place into the nose.    Marland Kitchen Fluticasone-Salmeterol (ADVAIR) 500-50 MCG/DOSE AEPB Inhale 1 puff into the lungs 2 (two) times daily.    . furosemide (LASIX) 40 MG tablet Take 40 mg by mouth daily.    Marland Kitchen glimepiride (AMARYL) 2 MG tablet Take 2 mg by mouth daily with breakfast.    . Iron-Vitamin C (VITRON-C) 65-125 MG TABS Take 1 tablet by mouth  daily. 90 tablet 1  . levocetirizine (XYZAL) 5 MG tablet Take 5 mg by mouth every evening.    . meloxicam (MOBIC) 7.5 MG tablet Take 7.5 mg by mouth 2 (two) times daily as needed for pain. Takes one every other day    . metFORMIN (GLUCOPHAGE) 500 MG tablet Take 1,000 mg by mouth 2 (two) times daily with a meal.    . metoprolol (TOPROL-XL) 200 MG 24 hr tablet TAKE 1 TABLET BY MOUTH ONCE DAILY. TAKE WITH OR IMMEDIATELY FOLLOWING A MEAL. (Patient taking differently: Take 200 mg by mouth daily.) 90 tablet 3  . montelukast (SINGULAIR) 10 MG tablet Take 10 mg by mouth at bedtime.    . Multiple Vitamin (MULTIVITAMIN) tablet Take 1 tablet by mouth daily.     . Omega 3-6-9 Fatty Acids (TRIPLE OMEGA-3-6-9) CAPS Take 1 capsule by mouth daily.    Marland Kitchen omeprazole (PRILOSEC) 20 MG capsule Take 20 mg by mouth 2 (two) times daily before a meal.    . ondansetron (ZOFRAN-ODT) 4 MG disintegrating tablet Take 4 mg every 8 (eight) hours as needed by mouth for nausea or vomiting.    . pantoprazole (PROTONIX) 40 MG tablet Take 40 mg by mouth 2 (two) times daily.    . potassium chloride (K-DUR) 10 MEQ tablet TAKE 1 TABLET BY MOUTH ONCE DAILY (Patient taking differently: Take 10 mEq by mouth daily.) 90 tablet 3  . predniSONE (DELTASONE) 10 MG tablet Take by mouth.    . rosuvastatin (CRESTOR) 20 MG tablet Take 20 mg by mouth daily.    . Tiotropium Bromide Monohydrate 1.25 MCG/ACT AERS Inhale 2 puffs into the lungs daily.    . traMADol-acetaminophen (ULTRACET) 37.5-325 MG tablet Take 1 tablet by mouth every 6 (six) hours as needed. Takes twice a day    . traZODone (DESYREL) 100 MG tablet Take 100 mg by mouth at bedtime.    . vitamin B-12 (CYANOCOBALAMIN) 500 MCG tablet Take 500 mcg daily by mouth.    . vitamin C (ASCORBIC ACID) 500 MG tablet Take 500 mg by mouth daily.     . vitamin  E 400 UNIT capsule Take 400 Units by mouth daily.    . ciprofloxacin (CIPRO) 500 MG tablet Take 500 mg by mouth 2 (two) times daily. (Patient not  taking: Reported on 05/16/2020)    . Semaglutide,0.25 or 0.5MG /DOS, (OZEMPIC, 0.25 OR 0.5 MG/DOSE,) 2 MG/1.5ML SOPN Inject 0.375 mLs into the skin once a week. (Patient not taking: No sig reported)     No current facility-administered medications for this visit.     PHYSICAL EXAMINATION: ECOG PERFORMANCE STATUS: 1 - Symptomatic but completely ambulatory Vitals:   05/16/20 1318  BP: (!) 145/62  Pulse: 85  Resp: 16  Temp: 99 F (37.2 C)   Filed Weights   05/16/20 1318  Weight: 170 lb 6.4 oz (77.3 kg)    Physical Exam Constitutional:      General: She is not in acute distress. HENT:     Head: Normocephalic and atraumatic.  Eyes:     General: No scleral icterus. Cardiovascular:     Rate and Rhythm: Normal rate and regular rhythm.     Heart sounds: Normal heart sounds.  Pulmonary:     Effort: Pulmonary effort is normal. No respiratory distress.     Breath sounds: No wheezing.  Abdominal:     General: Bowel sounds are normal. There is no distension.     Palpations: Abdomen is soft.  Musculoskeletal:        General: No deformity. Normal range of motion.     Cervical back: Normal range of motion and neck supple.  Skin:    General: Skin is warm and dry.     Findings: No erythema or rash.  Neurological:     Mental Status: She is alert and oriented to person, place, and time. Mental status is at baseline.     Cranial Nerves: No cranial nerve deficit.     Coordination: Coordination normal.  Psychiatric:        Mood and Affect: Mood normal.      LABORATORY DATA:  I have reviewed the data as listed Lab Results  Component Value Date   WBC 7.5 05/13/2020   HGB 9.9 (L) 05/13/2020   HCT 30.5 (L) 05/13/2020   MCV 96.8 05/13/2020   PLT 224 05/13/2020   Recent Labs    06/11/19 1015 08/07/19 1029 10/08/19 0907 01/06/20 1054  NA 141 139 141 138  K 4.2 2.8* 3.5 3.7  CL 101 98 104 103  CO2 28 28 26 25   GLUCOSE 90 273* 129* 100*  BUN 23 16 16 16   CREATININE 1.02*  1.34* 1.30* 1.18*  CALCIUM 11.4*  12.0* 9.1 9.2 9.4  GFRNONAA 54* 39* 40* 48*  GFRAA >60 45* 46*  --   PROT 6.8  --  7.4 6.8  ALBUMIN 4.0  --  4.3 4.1  AST 15  --  16 16  ALT 14  --  12 13  ALKPHOS 41  --  39 37*  BILITOT 1.0  --  0.7 0.6   Iron/TIBC/Ferritin/ %Sat    Component Value Date/Time   IRON 67 05/13/2020 1346   TIBC 294 05/13/2020 1346   FERRITIN 174 05/13/2020 1346   IRONPCTSAT 23 05/13/2020 1346      ASSESSMENT & PLAN:  1. Iron deficiency anemia, unspecified iron deficiency anemia type   2. Anemia in stage 3a chronic kidney disease (HCC)    Anemia due to CKD.  And iron deficiency anemia Labs are reviewed and discussed with patient. Hemoglobin has slightly improved now at 9.9.  Close to her goal of hemoglobin Iron panel has also improved.  Ferritin level 174. In the context of chronic kidney disease, recommend patient to proceed with 1 additional dose of IV Venofer treatments. Since her hemoglobin is very close to her goal, I will hold off erythropoietin replacement therapy.  Follow-up in 12 weeks.  Orders Placed This Encounter  Procedures  . CBC with Differential/Platelet    Standing Status:   Future    Standing Expiration Date:   05/16/2021  . Ferritin    Standing Status:   Future    Standing Expiration Date:   05/16/2021  . Iron and TIBC    Standing Status:   Future    Standing Expiration Date:   05/16/2021    All questions were answered. The patient knows to call the clinic with any problems questions or concerns. Ollen Gross, MD  Earlie Server, MD, PhD 05/16/2020

## 2020-05-16 NOTE — Progress Notes (Signed)
Patient denies new problems/concerns today.   °

## 2020-08-10 ENCOUNTER — Other Ambulatory Visit: Payer: Self-pay

## 2020-08-10 ENCOUNTER — Ambulatory Visit
Admission: RE | Admit: 2020-08-10 | Discharge: 2020-08-10 | Disposition: A | Discharge status: Home / Self Care | Payer: Medicare Other | Source: Ambulatory Visit | Attending: Internal Medicine | Admitting: Internal Medicine

## 2020-08-10 DIAGNOSIS — Z1231 Encounter for screening mammogram for malignant neoplasm of breast: Secondary | ICD-10-CM

## 2020-08-15 ENCOUNTER — Other Ambulatory Visit: Payer: Self-pay

## 2020-08-15 ENCOUNTER — Inpatient Hospital Stay: Payer: Medicare Other | Attending: Oncology

## 2020-08-15 DIAGNOSIS — Z8249 Family history of ischemic heart disease and other diseases of the circulatory system: Secondary | ICD-10-CM | POA: Diagnosis not present

## 2020-08-15 DIAGNOSIS — Z803 Family history of malignant neoplasm of breast: Secondary | ICD-10-CM | POA: Diagnosis not present

## 2020-08-15 DIAGNOSIS — D631 Anemia in chronic kidney disease: Secondary | ICD-10-CM | POA: Insufficient documentation

## 2020-08-15 DIAGNOSIS — D509 Iron deficiency anemia, unspecified: Secondary | ICD-10-CM

## 2020-08-15 DIAGNOSIS — Z818 Family history of other mental and behavioral disorders: Secondary | ICD-10-CM | POA: Diagnosis not present

## 2020-08-15 DIAGNOSIS — Z7952 Long term (current) use of systemic steroids: Secondary | ICD-10-CM | POA: Diagnosis not present

## 2020-08-15 DIAGNOSIS — Z880 Allergy status to penicillin: Secondary | ICD-10-CM | POA: Diagnosis not present

## 2020-08-15 DIAGNOSIS — Z7289 Other problems related to lifestyle: Secondary | ICD-10-CM | POA: Diagnosis not present

## 2020-08-15 DIAGNOSIS — M419 Scoliosis, unspecified: Secondary | ICD-10-CM | POA: Insufficient documentation

## 2020-08-15 DIAGNOSIS — N1831 Chronic kidney disease, stage 3a: Secondary | ICD-10-CM | POA: Diagnosis not present

## 2020-08-15 DIAGNOSIS — Z8711 Personal history of peptic ulcer disease: Secondary | ICD-10-CM | POA: Insufficient documentation

## 2020-08-15 DIAGNOSIS — R5383 Other fatigue: Secondary | ICD-10-CM | POA: Insufficient documentation

## 2020-08-15 DIAGNOSIS — M1990 Unspecified osteoarthritis, unspecified site: Secondary | ICD-10-CM | POA: Insufficient documentation

## 2020-08-15 DIAGNOSIS — Z79899 Other long term (current) drug therapy: Secondary | ICD-10-CM | POA: Insufficient documentation

## 2020-08-15 DIAGNOSIS — Z9049 Acquired absence of other specified parts of digestive tract: Secondary | ICD-10-CM | POA: Insufficient documentation

## 2020-08-15 DIAGNOSIS — Z886 Allergy status to analgesic agent status: Secondary | ICD-10-CM | POA: Diagnosis not present

## 2020-08-15 DIAGNOSIS — E611 Iron deficiency: Secondary | ICD-10-CM | POA: Insufficient documentation

## 2020-08-15 DIAGNOSIS — Z801 Family history of malignant neoplasm of trachea, bronchus and lung: Secondary | ICD-10-CM | POA: Diagnosis not present

## 2020-08-15 LAB — CBC WITH DIFFERENTIAL/PLATELET
Abs Immature Granulocytes: 0.02 10*3/uL (ref 0.00–0.07)
Basophils Absolute: 0 10*3/uL (ref 0.0–0.1)
Basophils Relative: 1 %
Eosinophils Absolute: 0.1 10*3/uL (ref 0.0–0.5)
Eosinophils Relative: 2 %
HCT: 30.1 % — ABNORMAL LOW (ref 36.0–46.0)
Hemoglobin: 9.7 g/dL — ABNORMAL LOW (ref 12.0–15.0)
Immature Granulocytes: 0 %
Lymphocytes Relative: 38 %
Lymphs Abs: 2.4 10*3/uL (ref 0.7–4.0)
MCH: 32.2 pg (ref 26.0–34.0)
MCHC: 32.2 g/dL (ref 30.0–36.0)
MCV: 100 fL (ref 80.0–100.0)
Monocytes Absolute: 0.5 10*3/uL (ref 0.1–1.0)
Monocytes Relative: 8 %
Neutro Abs: 3.2 10*3/uL (ref 1.7–7.7)
Neutrophils Relative %: 51 %
Platelets: 232 10*3/uL (ref 150–400)
RBC: 3.01 MIL/uL — ABNORMAL LOW (ref 3.87–5.11)
RDW: 13.5 % (ref 11.5–15.5)
WBC: 6.2 10*3/uL (ref 4.0–10.5)
nRBC: 0 % (ref 0.0–0.2)

## 2020-08-15 LAB — IRON AND TIBC
Iron: 49 ug/dL (ref 28–170)
Saturation Ratios: 17 % (ref 10.4–31.8)
TIBC: 281 ug/dL (ref 250–450)
UIBC: 232 ug/dL

## 2020-08-15 LAB — FERRITIN: Ferritin: 117 ng/mL (ref 11–307)

## 2020-08-16 ENCOUNTER — Encounter: Payer: Self-pay | Admitting: Oncology

## 2020-08-16 ENCOUNTER — Inpatient Hospital Stay (HOSPITAL_BASED_OUTPATIENT_CLINIC_OR_DEPARTMENT_OTHER): Payer: Medicare Other | Admitting: Oncology

## 2020-08-16 ENCOUNTER — Inpatient Hospital Stay: Payer: Medicare Other

## 2020-08-16 VITALS — BP 122/50 | HR 70

## 2020-08-16 VITALS — BP 141/66 | HR 83 | Temp 98.6°F | Resp 18 | Wt 171.3 lb

## 2020-08-16 DIAGNOSIS — D631 Anemia in chronic kidney disease: Secondary | ICD-10-CM

## 2020-08-16 DIAGNOSIS — N1831 Chronic kidney disease, stage 3a: Secondary | ICD-10-CM

## 2020-08-16 DIAGNOSIS — D509 Iron deficiency anemia, unspecified: Secondary | ICD-10-CM

## 2020-08-16 MED ORDER — SODIUM CHLORIDE 0.9 % IV SOLN
Freq: Once | INTRAVENOUS | Status: AC
  Filled 2020-08-16: qty 250

## 2020-08-16 MED ORDER — SODIUM CHLORIDE 0.9 % IV SOLN: 200.0000 mg | Freq: Once | INTRAVENOUS | Status: DC

## 2020-08-16 MED ORDER — IRON SUCROSE 20 MG/ML IV SOLN
200.0000 mg | Freq: Once | INTRAVENOUS | Status: AC
Start: 2020-08-16 — End: 2020-08-16
  Administered 2020-08-16: 200 mg via INTRAVENOUS
  Filled 2020-08-16: qty 10

## 2020-08-16 NOTE — Addendum Note (Signed)
Addended by: Earlie Server on: 08/16/2020 08:31 PM   Modules accepted: Orders

## 2020-08-16 NOTE — Progress Notes (Signed)
Pt here for follow up. No new concerns voiced.   

## 2020-08-16 NOTE — Progress Notes (Signed)
Hematology/Oncology follow up note [facility] [phone] [phone]   Patient Care Team: Tracie Harrier, MD as PCP - General (Internal Medicine) Isaias Cowman, MD as Consulting Physician (Cardiology) Alisa Graff, FNP as Nurse Practitioner (Family Medicine) Earlie Server, MD as Consulting Physician (Oncology)  REFERRING PROVIDER: Tracie Harrier, MD  CHIEF COMPLAINTS/REASON FOR VISIT:  Anemia   HISTORY OF PRESENTING ILLNESS:  Diane Patton is a  75 y.o.  female with PMH listed below who was referred to me for evaluation of anemia Reviewed patient's recent labs that was done.  05/25/2019 labs revealed anemia with hemoglobin of 10.6 Reviewed patient's previous labs ordered by primary care physician's office, anemia is chronic onset , duration is since at least 2015.  Associated signs and symptoms: Patient reports fatigue.  Denies SOB with exertion.  Denies weight loss, easy bruising, hematochezia, hemoptysis, hematuria. Context: History of GI bleeding: Denies               History of Chronic kidney disease; denies               History of autoimmune disease: Denies               History of hemolytic anemia.  Denies               Last colonoscopy: March 2021 EGD showed submucosal gastric antrum mass with biopsy showed mild nonspecific chronic gastritis.  Negative for active inflammation and H. pylori.  Negative for dysplasia or malignancy.  May 2018 colonoscopy showed diverticulosis in the sigmoid colon, ascending and descending colon and transverse colon                Also noticed that patient had chronic calcium since April 2020.  Calcium levels at 11.3 on 05/25/2019. 05/25/2019, ferritin 8.  Folic acid> 56.2, vitamin B12 565.   INTERVAL HISTORY Diane Patton is a 75 y.o. female who has above history reviewed by me today presents for follow up visit for management of anemia and hypocalcemia. Problems and complaints are listed  below: Patient received for IV iron treatments. Feels well today.  No new complaints. Review of Systems  Constitutional:  Positive for fatigue. Negative for appetite change, chills and fever.  HENT:   Negative for hearing loss and voice change.   Eyes:  Negative for eye problems.  Respiratory:  Negative for chest tightness and cough.   Cardiovascular:  Negative for chest pain.  Gastrointestinal:  Negative for abdominal distention, abdominal pain and blood in stool.  Endocrine: Negative for hot flashes.  Genitourinary:  Negative for difficulty urinating and frequency.   Musculoskeletal:  Negative for arthralgias.  Skin:  Negative for itching and rash.  Neurological:  Negative for extremity weakness.  Hematological:  Negative for adenopathy.  Psychiatric/Behavioral:  Negative for confusion.     MEDICAL HISTORY:  Past Medical History:  Diagnosis Date   Asthma    Cardiomyopathy, nonischemic (HCC)    CHF (congestive heart failure) (HCC)    COPD (chronic obstructive pulmonary disease) (HCC)    Diabetes mellitus, type 2 (HCC)    Gastric ulcer without hemorrhage or perforation    GERD (gastroesophageal reflux disease)    HTN (hypertension)    Hyperlipidemia    IDA (iron deficiency anemia)    IDA (iron deficiency anemia) 04/05/2020   Obstructive sleep apnea on CPAP    Osteoarthritis    osteo of both knees   Scoliosis     SURGICAL HISTORY: Past Surgical History:  Procedure Laterality Date   ABDOMINAL HYSTERECTOMY     ankle (other)     APPENDECTOMY     BREAST BIOPSY Right 1999   neg   BREAST CYST ASPIRATION     neg   BREAST SURGERY     CARDIAC CATHETERIZATION     CLAVICLE SURGERY Right    Fx   COLONOSCOPY WITH PROPOFOL N/A 07/10/2016   Procedure: COLONOSCOPY WITH PROPOFOL;  Surgeon: Lollie Sails, MD;  Location: [facility] ENDOSCOPY;  Service: Endoscopy;  Laterality: N/A;   ear (otheR)     ESOPHAGOGASTRODUODENOSCOPY N/A 05/11/2019   Procedure: ESOPHAGOGASTRODUODENOSCOPY  (EGD);  Surgeon: Toledo, Benay Pike, MD;  Location: [facility] ENDOSCOPY;  Service: Gastroenterology;  Laterality: N/A;   ESOPHAGOGASTRODUODENOSCOPY (EGD) WITH PROPOFOL N/A 07/10/2016   Procedure: ESOPHAGOGASTRODUODENOSCOPY (EGD) WITH PROPOFOL;  Surgeon: Lollie Sails, MD;  Location: [facility] ENDOSCOPY;  Service: Endoscopy;  Laterality: N/A;   ESOPHAGOGASTRODUODENOSCOPY (EGD) WITH PROPOFOL N/A 09/17/2017   Procedure: ESOPHAGOGASTRODUODENOSCOPY (EGD) WITH PROPOFOL;  Surgeon: Lollie Sails, MD;  Location: [facility] ENDOSCOPY;  Service: Endoscopy;  Laterality: N/A;   feet (other)     HERNIA REPAIR     sinus (other)     stomach     UPPER ESOPHAGEAL ENDOSCOPIC ULTRASOUND (EUS) N/A 11/22/2016   Procedure: UPPER ESOPHAGEAL ENDOSCOPIC ULTRASOUND (EUS);  Surgeon: Reita Cliche, MD;  Location: [facility] ENDOSCOPY;  Service: Gastroenterology;  Laterality: N/A;    SOCIAL HISTORY: Social History   Socioeconomic History   Marital status: Married    Spouse name: Not on file   Number of children: Not on file   Years of education: Not on file   Highest education level: Not on file  Occupational History   Occupation: retired  Tobacco Use   Smoking status: Former    Packs/day: 1.00    Years: 15.00    Pack years: 15.00    Types: Cigarettes    Quit date: 07/21/1982    Years since quitting: 38.0   Smokeless tobacco: Never  Vaping Use   Vaping Use: Never used  Substance and Sexual Activity   Alcohol use: No    Alcohol/week: 15.0 standard drinks    Types: 15 Standard drinks or equivalent per week    Comment: quit drinking 04/1981   Drug use: No   Sexual activity: Not on file  Other Topics Concern   Not on file  Social History Narrative   Disabled. Regularly exercises.    Social Determinants of Health   Financial Resource Strain: Not on file  Food Insecurity: Not on file  Transportation Needs: Not on file  Physical Activity: Not on file  Stress: Not on file  Social Connections: Not on file  Intimate  Partner Violence: Not on file    FAMILY HISTORY: Family History  Problem Relation Age of Onset   Alzheimer's disease Other    Heart attack Mother    Hypertension Mother    Cancer Brother        lung   Cancer Maternal Aunt        mouth and breast   Breast cancer Maternal Aunt    Cancer Daughter        throat   Dementia Father     ALLERGIES:  is allergic to amoxicillin, aspirin, celebrex [celecoxib], latex, penicillins, and allegra [fexofenadine].  MEDICATIONS:  Current Outpatient Medications  Medication Sig Dispense Refill   albuterol (PROVENTIL HFA;VENTOLIN HFA) 108 (90 BASE) MCG/ACT inhaler Inhale 2 puffs into the lungs every 6 (six) hours as needed  for wheezing or shortness of breath.     aspirin 81 MG tablet Take 81 mg by mouth daily.     BIDIL 20-37.5 MG tablet TAKE 1 TABLET BY MOUTH THREE TIMES DAILY (Patient taking differently: Take 1 tablet by mouth 3 (three) times daily.) 270 tablet 3   Biotin 1000 MCG tablet Take 1,000 mcg by mouth daily.      calcium carbonate (OS-CAL) 600 MG TABS tablet Take 600 mg by mouth 2 (two) times daily with a meal.      cetirizine (ZYRTEC) 10 MG tablet Take 10 mg by mouth daily.     cholecalciferol (VITAMIN D) 400 units TABS tablet Take 2,000 Units by mouth daily.      ciprofloxacin (CIPRO) 500 MG tablet Take 500 mg by mouth 2 (two) times daily. (Patient not taking: Reported on 05/16/2020)     Cod Liver Oil w/Vit A & D CAPS Take 1 capsule by mouth daily.     ENTRESTO 97-103 MG TAKE 1 TABLET BY MOUTH TWICE DAILY (Patient taking differently: Take 1 tablet by mouth 2 (two) times daily.) 180 tablet 3   fluticasone (FLONASE) 50 MCG/ACT nasal spray Place into the nose.     Fluticasone-Salmeterol (ADVAIR) 500-50 MCG/DOSE AEPB Inhale 1 puff into the lungs 2 (two) times daily.     furosemide (LASIX) 40 MG tablet Take 40 mg by mouth daily.     glimepiride (AMARYL) 2 MG tablet Take 2 mg by mouth daily with breakfast.     Iron-Vitamin C (VITRON-C) 65-125  MG TABS Take 1 tablet by mouth daily. 90 tablet 1   levocetirizine (XYZAL) 5 MG tablet Take 5 mg by mouth every evening.     meloxicam (MOBIC) 7.5 MG tablet Take 7.5 mg by mouth 2 (two) times daily as needed for pain. Takes one every other day     metFORMIN (GLUCOPHAGE) 500 MG tablet Take 1,000 mg by mouth 2 (two) times daily with a meal.     metoprolol (TOPROL-XL) 200 MG 24 hr tablet TAKE 1 TABLET BY MOUTH ONCE DAILY. TAKE WITH OR IMMEDIATELY FOLLOWING A MEAL. (Patient taking differently: Take 200 mg by mouth daily.) 90 tablet 3   montelukast (SINGULAIR) 10 MG tablet Take 10 mg by mouth at bedtime.     Multiple Vitamin (MULTIVITAMIN) tablet Take 1 tablet by mouth daily.      Omega 3-6-9 Fatty Acids (TRIPLE OMEGA-3-6-9) CAPS Take 1 capsule by mouth daily.     omeprazole (PRILOSEC) 20 MG capsule Take 20 mg by mouth 2 (two) times daily before a meal.     ondansetron (ZOFRAN-ODT) 4 MG disintegrating tablet Take 4 mg every 8 (eight) hours as needed by mouth for nausea or vomiting.     pantoprazole (PROTONIX) 40 MG tablet Take 40 mg by mouth 2 (two) times daily.     potassium chloride (K-DUR) 10 MEQ tablet TAKE 1 TABLET BY MOUTH ONCE DAILY (Patient taking differently: Take 10 mEq by mouth daily.) 90 tablet 3   predniSONE (DELTASONE) 10 MG tablet Take by mouth.     rosuvastatin (CRESTOR) 20 MG tablet Take 20 mg by mouth daily.     Semaglutide,0.25 or 0.5MG /DOS, (OZEMPIC, 0.25 OR 0.5 MG/DOSE,) 2 MG/1.5ML SOPN Inject 0.375 mLs into the skin once a week. (Patient not taking: No sig reported)     Tiotropium Bromide Monohydrate 1.25 MCG/ACT AERS Inhale 2 puffs into the lungs daily.     traMADol-acetaminophen (ULTRACET) 37.5-325 MG tablet Take 1 tablet by mouth every  6 (six) hours as needed. Takes twice a day     traZODone (DESYREL) 100 MG tablet Take 100 mg by mouth at bedtime.     vitamin B-12 (CYANOCOBALAMIN) 500 MCG tablet Take 500 mcg daily by mouth.     vitamin C (ASCORBIC ACID) 500 MG tablet Take 500  mg by mouth daily.      vitamin E 400 UNIT capsule Take 400 Units by mouth daily.     No current facility-administered medications for this visit.     PHYSICAL EXAMINATION: ECOG PERFORMANCE STATUS: 1 - Symptomatic but completely ambulatory Vitals:   08/16/20 1318  BP: (!) 141/66  Pulse: 83  Resp: 18  Temp: 98.6 F (37 C)   Filed Weights   08/16/20 1318  Weight: 171 lb 4.8 oz (77.7 kg)    Physical Exam Constitutional:      General: She is not in acute distress. HENT:     Head: Normocephalic and atraumatic.  Eyes:     General: No scleral icterus. Cardiovascular:     Rate and Rhythm: Normal rate and regular rhythm.     Heart sounds: Normal heart sounds.  Pulmonary:     Effort: Pulmonary effort is normal. No respiratory distress.     Breath sounds: No wheezing.  Abdominal:     General: Bowel sounds are normal. There is no distension.     Palpations: Abdomen is soft.  Musculoskeletal:        General: No deformity. Normal range of motion.     Cervical back: Normal range of motion and neck supple.  Skin:    General: Skin is warm and dry.     Findings: No erythema or rash.  Neurological:     Mental Status: She is alert and oriented to person, place, and time. Mental status is at baseline.     Cranial Nerves: No cranial nerve deficit.     Coordination: Coordination normal.  Psychiatric:        Mood and Affect: Mood normal.     LABORATORY DATA:  I have reviewed the data as listed Lab Results  Component Value Date   WBC 6.2 08/15/2020   HGB 9.7 (L) 08/15/2020   HCT 30.1 (L) 08/15/2020   MCV 100.0 08/15/2020   PLT 232 08/15/2020   Recent Labs    10/08/19 0907 01/06/20 1054  NA 141 138  K 3.5 3.7  CL 104 103  CO2 26 25  GLUCOSE 129* 100*  BUN 16 16  CREATININE 1.30* 1.18*  CALCIUM 9.2 9.4  GFRNONAA 40* 48*  GFRAA 46*  --   PROT 7.4 6.8  ALBUMIN 4.3 4.1  AST 16 16  ALT 12 13  ALKPHOS 39 37*  BILITOT 0.7 0.6    Iron/TIBC/Ferritin/ %Sat     Component Value Date/Time   IRON 49 08/15/2020 1312   TIBC 281 08/15/2020 1312   FERRITIN 117 08/15/2020 1312   IRONPCTSAT 17 08/15/2020 1312      ASSESSMENT & PLAN:  1. Iron deficiency anemia, unspecified iron deficiency anemia type   2. Anemia in stage 3a chronic kidney disease (HCC)    Anemia due to CKD.  And iron deficiency anemia Labs are reviewed and discussed with patient. Hemoglobin has decreased to 9.7, iron Panel showed iron saturation 17, slightly lower than 3 months ago.  Ferritin 117.  Recommend patient to proceed with IV Venofer treatment weekly x3.  Follow-up in 3 months.  If Hemoglobin is persistently below 10 despite improved off iron  panel, consider Retacrit.  She agrees with the plan.    Follow-up in 12 weeks.  Orders Placed This Encounter  Procedures   CBC with Differential/Platelet    Standing Status:   Future    Standing Expiration Date:   08/16/2021   Ferritin    Standing Status:   Future    Standing Expiration Date:   08/16/2021   Iron and TIBC    Standing Status:   Future    Standing Expiration Date:   08/16/2021    All questions were answered. The patient knows to call the clinic with any problems questions or concerns. Ollen Gross, MD  Earlie Server, MD, PhD 08/16/2020

## 2020-08-16 NOTE — Patient Instructions (Signed)
[facility] [place] [facility]  Discharge Instructions: Thank you for choosing [facility] to provide your oncology and hematology care.  If you have a lab appointment with the Cancer Center, please go directly to the Cancer Center and check in at the registration area.  Wear comfortable clothing and clothing appropriate for easy access to any Portacath or PICC line.   We strive to give you quality time with your provider. You may need to reschedule your appointment if you arrive late (15 or more minutes).  Arriving late affects you and other patients whose appointments are after yours.  Also, if you miss three or more appointments without notifying the office, you may be dismissed from the clinic at the provider's discretion.      For prescription refill requests, have your pharmacy contact our office and allow 72 hours for refills to be completed.    Today you received Venofer   To help prevent nausea and vomiting after your treatment, we encourage you to take your nausea medication as directed.  BELOW ARE SYMPTOMS THAT SHOULD BE REPORTED IMMEDIATELY: . *FEVER GREATER THAN 100.4 F (38 C) OR HIGHER . *CHILLS OR SWEATING . *NAUSEA AND VOMITING THAT IS NOT CONTROLLED WITH YOUR NAUSEA MEDICATION . *UNUSUAL SHORTNESS OF BREATH . *UNUSUAL BRUISING OR BLEEDING . *URINARY PROBLEMS (pain or burning when urinating, or frequent urination) . *BOWEL PROBLEMS (unusual diarrhea, constipation, pain near the anus) . TENDERNESS IN MOUTH AND THROAT WITH OR WITHOUT PRESENCE OF ULCERS (sore throat, sores in mouth, or a toothache) . UNUSUAL RASH, SWELLING OR PAIN  . UNUSUAL VAGINAL DISCHARGE OR ITCHING   Items with * indicate a potential emergency and should be followed up as soon as possible or go to the Emergency Department if any problems should occur.  Please show the CHEMOTHERAPY ALERT CARD or IMMUNOTHERAPY ALERT CARD at check-in to the Emergency Department and triage  nurse.  Should you have questions after your visit or need to cancel or reschedule your appointment, please contact [facility] [place] [facility]  [phone] and follow the prompts.  Office hours are 8:00 a.m. to 4:30 p.m. Monday - Friday. Please note that voicemails left after 4:00 p.m. may not be returned until the following business day.  We are closed weekends and major holidays. You have access to a nurse at all times for urgent questions. Please call the main number to the clinic [phone] and follow the prompts.  For any non-urgent questions, you may also contact your provider using MyChart. We now offer e-Visits for anyone 18 and older to request care online for non-urgent symptoms. For details visit [email or web address].   Also download the MyChart app! Go to the app store, search "MyChart", open the app, select [facility], and log in with your MyChart username and password.  Due to Covid, a mask is required upon entering the hospital/clinic. If you do not have a mask, one will be given to you upon arrival. For doctor visits, patients may have 1 support person aged 18 or older with them. For treatment visits, patients cannot have anyone with them due to current Covid guidelines and our immunocompromised population.  

## 2020-08-25 ENCOUNTER — Other Ambulatory Visit (HOSPITAL_COMMUNITY): Payer: Self-pay | Admitting: Nephrology

## 2020-08-25 ENCOUNTER — Other Ambulatory Visit: Payer: Self-pay | Admitting: Nephrology

## 2020-08-25 DIAGNOSIS — N1832 Chronic kidney disease, stage 3b: Secondary | ICD-10-CM

## 2020-08-25 DIAGNOSIS — D631 Anemia in chronic kidney disease: Secondary | ICD-10-CM | POA: Diagnosis present

## 2020-08-25 DIAGNOSIS — R8290 Unspecified abnormal findings in urine: Secondary | ICD-10-CM

## 2020-08-25 DIAGNOSIS — N189 Chronic kidney disease, unspecified: Secondary | ICD-10-CM | POA: Diagnosis present

## 2020-08-30 ENCOUNTER — Other Ambulatory Visit: Payer: Self-pay

## 2020-08-30 ENCOUNTER — Inpatient Hospital Stay: Payer: Medicare Other | Attending: Oncology

## 2020-08-30 VITALS — BP 146/77 | HR 89 | Temp 97.6°F

## 2020-08-30 DIAGNOSIS — D509 Iron deficiency anemia, unspecified: Secondary | ICD-10-CM | POA: Diagnosis present

## 2020-08-30 MED ORDER — SODIUM CHLORIDE 0.9 % IV SOLN: 200.0000 mg | Freq: Once | INTRAVENOUS | Status: DC

## 2020-08-30 MED ORDER — IRON SUCROSE 20 MG/ML IV SOLN
200.0000 mg | Freq: Once | INTRAVENOUS | Status: AC
  Administered 2020-08-30: 200 mg via INTRAVENOUS
  Filled 2020-08-30: qty 10

## 2020-08-30 MED ORDER — SODIUM CHLORIDE 0.9 % IV SOLN
INTRAVENOUS | Status: DC
  Filled 2020-08-30 (×2): qty 250

## 2020-08-30 NOTE — Patient Instructions (Signed)
[facility] [place] [facility]  Discharge Instructions: Thank you for choosing [facility] to provide your oncology and hematology care.  If you have a lab appointment with the Cancer Center, please go directly to the Cancer Center and check in at the registration area.  Wear comfortable clothing and clothing appropriate for easy access to any Portacath or PICC line.   We strive to give you quality time with your provider. You may need to reschedule your appointment if you arrive late (15 or more minutes).  Arriving late affects you and other patients whose appointments are after yours.  Also, if you miss three or more appointments without notifying the office, you may be dismissed from the clinic at the provider's discretion.      For prescription refill requests, have your pharmacy contact our office and allow 72 hours for refills to be completed.    Today you received the following chemotherapy and/or immunotherapy agents VENOFER      To help prevent nausea and vomiting after your treatment, we encourage you to take your nausea medication as directed.  BELOW ARE SYMPTOMS THAT SHOULD BE REPORTED IMMEDIATELY: *FEVER GREATER THAN 100.4 F (38 C) OR HIGHER *CHILLS OR SWEATING *NAUSEA AND VOMITING THAT IS NOT CONTROLLED WITH YOUR NAUSEA MEDICATION *UNUSUAL SHORTNESS OF BREATH *UNUSUAL BRUISING OR BLEEDING *URINARY PROBLEMS (pain or burning when urinating, or frequent urination) *BOWEL PROBLEMS (unusual diarrhea, constipation, pain near the anus) TENDERNESS IN MOUTH AND THROAT WITH OR WITHOUT PRESENCE OF ULCERS (sore throat, sores in mouth, or a toothache) UNUSUAL RASH, SWELLING OR PAIN  UNUSUAL VAGINAL DISCHARGE OR ITCHING   Items with * indicate a potential emergency and should be followed up as soon as possible or go to the Emergency Department if any problems should occur.  Please show the CHEMOTHERAPY ALERT CARD or IMMUNOTHERAPY ALERT CARD at check-in to  the Emergency Department and triage nurse.  Should you have questions after your visit or need to cancel or reschedule your appointment, please contact [facility] [place] [facility]  [phone] and follow the prompts.  Office hours are 8:00 a.m. to 4:30 p.m. Monday - Friday. Please note that voicemails left after 4:00 p.m. may not be returned until the following business day.  We are closed weekends and major holidays. You have access to a nurse at all times for urgent questions. Please call the main number to the clinic [phone] and follow the prompts.  For any non-urgent questions, you may also contact your provider using MyChart. We now offer e-Visits for anyone 18 and older to request care online for non-urgent symptoms. For details visit [email or web address].   Also download the MyChart app! Go to the app store, search "MyChart", open the app, select [facility], and log in with your MyChart username and password.  Due to Covid, a mask is required upon entering the hospital/clinic. If you do not have a mask, one will be given to you upon arrival. For doctor visits, patients may have 1 support person aged 18 or older with them. For treatment visits, patients cannot have anyone with them due to current Covid guidelines and our immunocompromised population.   Iron Sucrose injection What is this medication? IRON SUCROSE (AHY ern SOO krohs) is an iron complex. Iron is used to make healthy red blood cells, which carry oxygen and nutrients throughout the body. This medicine is used to treat iron deficiency anemia in people with chronickidney disease. This medicine may be used for other   purposes; ask your health care provider orpharmacist if you have questions. COMMON BRAND NAME(S): Venofer What should I tell my care team before I take this medication? They need to know if you have any of these conditions: anemia not caused by low iron levels heart disease high levels of  iron in the blood kidney disease liver disease an unusual or allergic reaction to iron, other medicines, foods, dyes, or preservatives pregnant or trying to get pregnant breast-feeding How should I use this medication? This medicine is for infusion into a vein. It is given by a health careprofessional in a hospital or clinic setting. Talk to your pediatrician regarding the use of this medicine in children. While this drug may be prescribed for children as young as 2 years for selectedconditions, precautions do apply. Overdosage: If you think you have taken too much of this medicine contact apoison control center or emergency room at once. NOTE: This medicine is only for you. Do not share this medicine with others. What if I miss a dose? It is important not to miss your dose. Call your doctor or health careprofessional if you are unable to keep an appointment. What may interact with this medication? Do not take this medicine with any of the following medications: deferoxamine dimercaprol other iron products This medicine may also interact with the following medications: chloramphenicol deferasirox This list may not describe all possible interactions. Give your health care provider a list of all the medicines, herbs, non-prescription drugs, or dietary supplements you use. Also tell them if you smoke, drink alcohol, or use illegaldrugs. Some items may interact with your medicine. What should I watch for while using this medication? Visit your doctor or healthcare professional regularly. Tell your doctor or healthcare professional if your symptoms do not start to get better or if theyget worse. You may need blood work done while you are taking this medicine. You may need to follow a special diet. Talk to your doctor. Foods that contain iron include: whole grains/cereals, dried fruits, beans, or peas, leafy greenvegetables, and organ meats (liver, kidney). What side effects may I notice from  receiving this medication? Side effects that you should report to your doctor or health care professionalas soon as possible: allergic reactions like skin rash, itching or hives, swelling of the face, lips, or tongue breathing problems changes in blood pressure cough fast, irregular heartbeat feeling faint or lightheaded, falls fever or chills flushing, sweating, or hot feelings joint or muscle aches/pains seizures swelling of the ankles or feet unusually weak or tired Side effects that usually do not require medical attention (report to yourdoctor or health care professional if they continue or are bothersome): diarrhea feeling achy headache irritation at site where injected nausea, vomiting stomach upset tiredness This list may not describe all possible side effects. Call your doctor for medical advice about side effects. You may report side effects to FDA at1-800-FDA-1088. Where should I keep my medication? This drug is given in a hospital or clinic and will not be stored at home. NOTE: This sheet is a summary. It may not cover all possible information. If you have questions about this medicine, talk to your doctor, pharmacist, orhealth care provider.  2022 Elsevier/Gold Standard (2010-11-16 17:14:35)  

## 2020-09-13 ENCOUNTER — Inpatient Hospital Stay: Payer: Medicare Other

## 2020-09-13 DIAGNOSIS — D509 Iron deficiency anemia, unspecified: Secondary | ICD-10-CM

## 2020-09-13 MED ORDER — SODIUM CHLORIDE 0.9 % IV SOLN
INTRAVENOUS | Status: DC
  Filled 2020-09-13: qty 250

## 2020-09-13 MED ORDER — IRON SUCROSE 20 MG/ML IV SOLN
200.0000 mg | Freq: Once | INTRAVENOUS | Status: AC
Start: 2020-09-13 — End: 2020-09-13
  Administered 2020-09-13: 200 mg via INTRAVENOUS
  Filled 2020-09-13: qty 10

## 2020-09-13 MED ORDER — SODIUM CHLORIDE 0.9 % IV SOLN: 200.0000 mg | Freq: Once | INTRAVENOUS | Status: DC

## 2020-09-13 NOTE — Patient Instructions (Signed)
[facility]  Discharge Instructions: Thank you for choosing [facility] to provide your oncology and hematology care.  If you have a lab appointment with the Cancer Center, please go directly to the Cancer Center and check in at the registration area.  Wear comfortable clothing and clothing appropriate for easy access to any Portacath or PICC line.   We strive to give you quality time with your provider. You may need to reschedule your appointment if you arrive late (15 or more minutes).  Arriving late affects you and other patients whose appointments are after yours.  Also, if you miss three or more appointments without notifying the office, you may be dismissed from the clinic at the provider's discretion.      For prescription refill requests, have your pharmacy contact our office and allow 72 hours for refills to be completed.    Today you received the following : Venofer   To help prevent nausea and vomiting after your treatment, we encourage you to take your nausea medication as directed.  BELOW ARE SYMPTOMS THAT SHOULD BE REPORTED IMMEDIATELY: . *FEVER GREATER THAN 100.4 F (38 C) OR HIGHER . *CHILLS OR SWEATING . *NAUSEA AND VOMITING THAT IS NOT CONTROLLED WITH YOUR NAUSEA MEDICATION . *UNUSUAL SHORTNESS OF BREATH . *UNUSUAL BRUISING OR BLEEDING . *URINARY PROBLEMS (pain or burning when urinating, or frequent urination) . *BOWEL PROBLEMS (unusual diarrhea, constipation, pain near the anus) . TENDERNESS IN MOUTH AND THROAT WITH OR WITHOUT PRESENCE OF ULCERS (sore throat, sores in mouth, or a toothache) . UNUSUAL RASH, SWELLING OR PAIN  . UNUSUAL VAGINAL DISCHARGE OR ITCHING   Items with * indicate a potential emergency and should be followed up as soon as possible or go to the Emergency Department if any problems should occur.  Please show the CHEMOTHERAPY ALERT CARD or IMMUNOTHERAPY ALERT CARD at check-in to the Emergency  Department and triage nurse.  Should you have questions after your visit or need to cancel or reschedule your appointment, please contact [facility]  [phone] and follow the prompts.  Office hours are 8:00 a.m. to 4:30 p.m. Monday - Friday. Please note that voicemails left after 4:00 p.m. may not be returned until the following business day.  We are closed weekends and major holidays. You have access to a nurse at all times for urgent questions. Please call the main number to the clinic [phone] and follow the prompts.  For any non-urgent questions, you may also contact your provider using MyChart. We now offer e-Visits for anyone 18 and older to request care online for non-urgent symptoms. For details visit [email or web address].   Also download the MyChart app! Go to the app store, search "MyChart", open the app, select [facility], and log in with your MyChart username and password.  Due to Covid, a mask is required upon entering the hospital/clinic. If you do not have a mask, one will be given to you upon arrival. For doctor visits, patients may have 1 support person aged 18 or older with them. For treatment visits, patients cannot have anyone with them due to current Covid guidelines and our immunocompromised population.  

## 2020-09-19 ENCOUNTER — Ambulatory Visit
Admission: RE | Admit: 2020-09-19 | Discharge: 2020-09-19 | Disposition: A | Discharge status: Home / Self Care | Payer: Medicare Other | Source: Ambulatory Visit | Attending: Nephrology | Admitting: Nephrology

## 2020-09-19 ENCOUNTER — Other Ambulatory Visit: Payer: Self-pay

## 2020-09-19 DIAGNOSIS — R8290 Unspecified abnormal findings in urine: Secondary | ICD-10-CM | POA: Diagnosis present

## 2020-09-19 DIAGNOSIS — N1832 Chronic kidney disease, stage 3b: Secondary | ICD-10-CM | POA: Insufficient documentation

## 2020-11-11 ENCOUNTER — Inpatient Hospital Stay: Payer: Medicare Other

## 2020-11-11 ENCOUNTER — Telehealth: Payer: Self-pay | Admitting: *Deleted

## 2020-11-11 NOTE — Telephone Encounter (Signed)
Thanks. Looks like they have been already r/s.

## 2020-11-11 NOTE — Telephone Encounter (Signed)
Patient called to cancel her appointment today citing she has diarrhea and vomiting

## 2020-11-16 ENCOUNTER — Ambulatory Visit: Payer: Medicare Other

## 2020-11-16 ENCOUNTER — Inpatient Hospital Stay: Payer: Medicare Other

## 2020-11-16 ENCOUNTER — Ambulatory Visit: Payer: Medicare Other | Admitting: Oncology

## 2020-11-22 ENCOUNTER — Inpatient Hospital Stay: Payer: Medicare Other | Admitting: Oncology

## 2020-11-22 ENCOUNTER — Inpatient Hospital Stay: Payer: Medicare Other

## 2020-11-25 ENCOUNTER — Inpatient Hospital Stay: Payer: Medicare Other | Attending: Oncology

## 2020-11-25 ENCOUNTER — Other Ambulatory Visit: Payer: Self-pay

## 2020-11-25 DIAGNOSIS — E611 Iron deficiency: Secondary | ICD-10-CM | POA: Insufficient documentation

## 2020-11-25 DIAGNOSIS — Z886 Allergy status to analgesic agent status: Secondary | ICD-10-CM | POA: Diagnosis not present

## 2020-11-25 DIAGNOSIS — Z801 Family history of malignant neoplasm of trachea, bronchus and lung: Secondary | ICD-10-CM | POA: Insufficient documentation

## 2020-11-25 DIAGNOSIS — Z8711 Personal history of peptic ulcer disease: Secondary | ICD-10-CM | POA: Insufficient documentation

## 2020-11-25 DIAGNOSIS — Z803 Family history of malignant neoplasm of breast: Secondary | ICD-10-CM | POA: Diagnosis not present

## 2020-11-25 DIAGNOSIS — Z79899 Other long term (current) drug therapy: Secondary | ICD-10-CM | POA: Insufficient documentation

## 2020-11-25 DIAGNOSIS — N1831 Chronic kidney disease, stage 3a: Secondary | ICD-10-CM | POA: Diagnosis not present

## 2020-11-25 DIAGNOSIS — D509 Iron deficiency anemia, unspecified: Secondary | ICD-10-CM

## 2020-11-25 DIAGNOSIS — Z8249 Family history of ischemic heart disease and other diseases of the circulatory system: Secondary | ICD-10-CM | POA: Insufficient documentation

## 2020-11-25 DIAGNOSIS — R5383 Other fatigue: Secondary | ICD-10-CM | POA: Diagnosis not present

## 2020-11-25 DIAGNOSIS — D631 Anemia in chronic kidney disease: Secondary | ICD-10-CM | POA: Diagnosis present

## 2020-11-25 DIAGNOSIS — Z9049 Acquired absence of other specified parts of digestive tract: Secondary | ICD-10-CM | POA: Insufficient documentation

## 2020-11-25 DIAGNOSIS — Z880 Allergy status to penicillin: Secondary | ICD-10-CM | POA: Insufficient documentation

## 2020-11-25 DIAGNOSIS — Z87891 Personal history of nicotine dependence: Secondary | ICD-10-CM | POA: Diagnosis not present

## 2020-11-25 DIAGNOSIS — Z818 Family history of other mental and behavioral disorders: Secondary | ICD-10-CM | POA: Diagnosis not present

## 2020-11-25 LAB — CBC WITH DIFFERENTIAL/PLATELET
Abs Immature Granulocytes: 0.03 10*3/uL (ref 0.00–0.07)
Basophils Absolute: 0.1 10*3/uL (ref 0.0–0.1)
Basophils Relative: 1 %
Eosinophils Absolute: 0.1 10*3/uL (ref 0.0–0.5)
Eosinophils Relative: 2 %
HCT: 34.2 % — ABNORMAL LOW (ref 36.0–46.0)
Hemoglobin: 10.8 g/dL — ABNORMAL LOW (ref 12.0–15.0)
Immature Granulocytes: 0 %
Lymphocytes Relative: 30 %
Lymphs Abs: 2.2 10*3/uL (ref 0.7–4.0)
MCH: 31.5 pg (ref 26.0–34.0)
MCHC: 31.6 g/dL (ref 30.0–36.0)
MCV: 99.7 fL (ref 80.0–100.0)
Monocytes Absolute: 0.4 10*3/uL (ref 0.1–1.0)
Monocytes Relative: 6 %
Neutro Abs: 4.6 10*3/uL (ref 1.7–7.7)
Neutrophils Relative %: 61 %
Platelets: 213 10*3/uL (ref 150–400)
RBC: 3.43 MIL/uL — ABNORMAL LOW (ref 3.87–5.11)
RDW: 12.3 % (ref 11.5–15.5)
WBC: 7.4 10*3/uL (ref 4.0–10.5)
nRBC: 0 % (ref 0.0–0.2)

## 2020-11-25 LAB — IRON AND TIBC
Iron: 47 ug/dL (ref 28–170)
Saturation Ratios: 19 % (ref 10.4–31.8)
TIBC: 246 ug/dL — ABNORMAL LOW (ref 250–450)
UIBC: 199 ug/dL

## 2020-11-25 LAB — FERRITIN: Ferritin: 184 ng/mL (ref 11–307)

## 2020-11-29 ENCOUNTER — Inpatient Hospital Stay: Payer: Medicare Other

## 2020-11-29 ENCOUNTER — Inpatient Hospital Stay (HOSPITAL_BASED_OUTPATIENT_CLINIC_OR_DEPARTMENT_OTHER): Payer: Medicare Other | Admitting: Oncology

## 2020-11-29 ENCOUNTER — Encounter: Payer: Self-pay | Admitting: Oncology

## 2020-11-29 VITALS — BP 119/78 | HR 74 | Temp 98.2°F | Wt 160.3 lb

## 2020-11-29 DIAGNOSIS — D509 Iron deficiency anemia, unspecified: Secondary | ICD-10-CM | POA: Diagnosis not present

## 2020-11-29 DIAGNOSIS — N1831 Chronic kidney disease, stage 3a: Secondary | ICD-10-CM

## 2020-11-29 DIAGNOSIS — D631 Anemia in chronic kidney disease: Secondary | ICD-10-CM

## 2020-11-29 MED ORDER — SODIUM CHLORIDE 0.9 % IV SOLN
INTRAVENOUS | Status: DC
  Filled 2020-11-29: qty 250

## 2020-11-29 MED ORDER — IRON SUCROSE 20 MG/ML IV SOLN
200.0000 mg | Freq: Once | INTRAVENOUS | Status: AC
  Administered 2020-11-29: 200 mg via INTRAVENOUS
  Filled 2020-11-29: qty 10

## 2020-11-29 MED ORDER — SODIUM CHLORIDE 0.9 % IV SOLN: 200.0000 mg | Freq: Once | INTRAVENOUS | Status: DC

## 2020-11-29 NOTE — Progress Notes (Signed)
Hematology/Oncology follow up note [facility] [phone] [phone]   Patient Care Team: Tracie Harrier, MD as PCP - General (Internal Medicine) Isaias Cowman, MD as Consulting Physician (Cardiology) Alisa Graff, FNP as Nurse Practitioner (Family Medicine) Earlie Server, MD as Consulting Physician (Oncology)  REFERRING PROVIDER: Tracie Harrier, MD  CHIEF COMPLAINTS/REASON FOR VISIT:  Anemia   HISTORY OF PRESENTING ILLNESS:  Diane Patton is a  75 y.o.  female with PMH listed below who was referred to me for evaluation of anemia Reviewed patient's recent labs that was done.  05/25/2019 labs revealed anemia with hemoglobin of 10.6 Reviewed patient's previous labs ordered by primary care physician's office, anemia is chronic onset , duration is since at least 2015.  Associated signs and symptoms: Patient reports fatigue.  Denies SOB with exertion.  Denies weight loss, easy bruising, hematochezia, hemoptysis, hematuria. Context: History of GI bleeding: Denies               History of Chronic kidney disease; denies               History of autoimmune disease: Denies               History of hemolytic anemia.  Denies               Last colonoscopy: March 2021 EGD showed submucosal gastric antrum mass with biopsy showed mild nonspecific chronic gastritis.  Negative for active inflammation and H. pylori.  Negative for dysplasia or malignancy.  May 2018 colonoscopy showed diverticulosis in the sigmoid colon, ascending and descending colon and transverse colon                Also noticed that patient had chronic calcium since April 2020.  Calcium levels at 11.3 on 05/25/2019. 05/25/2019, ferritin 8.  Folic acid> 60.1, vitamin B12 565.   INTERVAL HISTORY Diane Patton is a 75 y.o. female who has above history reviewed by me today presents for follow up visit for management of anemia  Patient has received IV Venofer treatments. Today she feels  better.  Fatigue has improved.  No new complaints.   Review of Systems  Constitutional:  Positive for fatigue. Negative for appetite change, chills and fever.  HENT:   Negative for hearing loss and voice change.   Eyes:  Negative for eye problems.  Respiratory:  Negative for chest tightness and cough.   Cardiovascular:  Negative for chest pain.  Gastrointestinal:  Negative for abdominal distention, abdominal pain and blood in stool.  Endocrine: Negative for hot flashes.  Genitourinary:  Negative for difficulty urinating and frequency.   Musculoskeletal:  Negative for arthralgias.  Skin:  Negative for itching and rash.  Neurological:  Negative for extremity weakness.  Hematological:  Negative for adenopathy.  Psychiatric/Behavioral:  Negative for confusion.     MEDICAL HISTORY:  Past Medical History:  Diagnosis Date   Asthma    Cardiomyopathy, nonischemic (HCC)    CHF (congestive heart failure) (HCC)    COPD (chronic obstructive pulmonary disease) (HCC)    Diabetes mellitus, type 2 (HCC)    Gastric ulcer without hemorrhage or perforation    GERD (gastroesophageal reflux disease)    HTN (hypertension)    Hyperlipidemia    IDA (iron deficiency anemia)    IDA (iron deficiency anemia) 04/05/2020   Obstructive sleep apnea on CPAP    Osteoarthritis    osteo of both knees   Scoliosis     SURGICAL HISTORY: Past Surgical History:  Procedure Laterality Date   ABDOMINAL HYSTERECTOMY     ankle (other)     APPENDECTOMY     BREAST BIOPSY Right 1999   neg   BREAST CYST ASPIRATION     neg   BREAST SURGERY     CARDIAC CATHETERIZATION     CLAVICLE SURGERY Right    Fx   COLONOSCOPY WITH PROPOFOL N/A 07/10/2016   Procedure: COLONOSCOPY WITH PROPOFOL;  Surgeon: Lollie Sails, MD;  Location: [facility] ENDOSCOPY;  Service: Endoscopy;  Laterality: N/A;   ear (otheR)     ESOPHAGOGASTRODUODENOSCOPY N/A 05/11/2019   Procedure: ESOPHAGOGASTRODUODENOSCOPY (EGD);  Surgeon: Toledo, Benay Pike,  MD;  Location: [facility] ENDOSCOPY;  Service: Gastroenterology;  Laterality: N/A;   ESOPHAGOGASTRODUODENOSCOPY (EGD) WITH PROPOFOL N/A 07/10/2016   Procedure: ESOPHAGOGASTRODUODENOSCOPY (EGD) WITH PROPOFOL;  Surgeon: Lollie Sails, MD;  Location: [facility] ENDOSCOPY;  Service: Endoscopy;  Laterality: N/A;   ESOPHAGOGASTRODUODENOSCOPY (EGD) WITH PROPOFOL N/A 09/17/2017   Procedure: ESOPHAGOGASTRODUODENOSCOPY (EGD) WITH PROPOFOL;  Surgeon: Lollie Sails, MD;  Location: [facility] ENDOSCOPY;  Service: Endoscopy;  Laterality: N/A;   feet (other)     HERNIA REPAIR     sinus (other)     stomach     UPPER ESOPHAGEAL ENDOSCOPIC ULTRASOUND (EUS) N/A 11/22/2016   Procedure: UPPER ESOPHAGEAL ENDOSCOPIC ULTRASOUND (EUS);  Surgeon: Reita Cliche, MD;  Location: [facility] ENDOSCOPY;  Service: Gastroenterology;  Laterality: N/A;    SOCIAL HISTORY: Social History   Socioeconomic History   Marital status: Married    Spouse name: Not on file   Number of children: Not on file   Years of education: Not on file   Highest education level: Not on file  Occupational History   Occupation: retired  Tobacco Use   Smoking status: Former    Packs/day: 1.00    Years: 15.00    Pack years: 15.00    Types: Cigarettes    Quit date: 07/21/1982    Years since quitting: 38.3   Smokeless tobacco: Never  Vaping Use   Vaping Use: Never used  Substance and Sexual Activity   Alcohol use: No    Alcohol/week: 15.0 standard drinks    Types: 15 Standard drinks or equivalent per week    Comment: quit drinking 04/1981   Drug use: No   Sexual activity: Not on file  Other Topics Concern   Not on file  Social History Narrative   Disabled. Regularly exercises.    Social Determinants of Health   Financial Resource Strain: Not on file  Food Insecurity: Not on file  Transportation Needs: Not on file  Physical Activity: Not on file  Stress: Not on file  Social Connections: Not on file  Intimate Partner Violence: Not on file     FAMILY HISTORY: Family History  Problem Relation Age of Onset   Alzheimer's disease Other    Heart attack Mother    Hypertension Mother    Cancer Brother        lung   Cancer Maternal Aunt        mouth and breast   Breast cancer Maternal Aunt    Cancer Daughter        throat   Dementia Father     ALLERGIES:  is allergic to amoxicillin, aspirin, celebrex [celecoxib], latex, penicillins, and allegra [fexofenadine].  MEDICATIONS:  Current Outpatient Medications  Medication Sig Dispense Refill   albuterol (PROVENTIL HFA;VENTOLIN HFA) 108 (90 BASE) MCG/ACT inhaler Inhale 2 puffs into the lungs every 6 (six) hours as needed  for wheezing or shortness of breath.     Biotin 1000 MCG tablet Take 1,000 mcg by mouth daily.      calcium carbonate (OS-CAL) 600 MG TABS tablet Take 600 mg by mouth 2 (two) times daily with a meal.      cetirizine (ZYRTEC) 10 MG tablet Take 10 mg by mouth daily.     cholecalciferol (VITAMIN D) 400 units TABS tablet Take 2,000 Units by mouth daily.      Cod Liver Oil w/Vit A & D CAPS Take 1 capsule by mouth daily.     Fluticasone-Salmeterol (ADVAIR) 500-50 MCG/DOSE AEPB Inhale 1 puff into the lungs 2 (two) times daily.     furosemide (LASIX) 40 MG tablet Take 40 mg by mouth daily.     glimepiride (AMARYL) 2 MG tablet Take 2 mg by mouth daily with breakfast.     Iron-Vitamin C (VITRON-C) 65-125 MG TABS Take 1 tablet by mouth daily. 90 tablet 1   levocetirizine (XYZAL) 5 MG tablet Take 5 mg by mouth every evening.     meloxicam (MOBIC) 7.5 MG tablet Take 7.5 mg by mouth 2 (two) times daily as needed for pain. Takes one every other day     metFORMIN (GLUCOPHAGE) 500 MG tablet Take 1,000 mg by mouth 2 (two) times daily with a meal.     montelukast (SINGULAIR) 10 MG tablet Take 10 mg by mouth at bedtime.     Multiple Vitamin (MULTIVITAMIN) tablet Take 1 tablet by mouth daily.      Omega 3-6-9 Fatty Acids (TRIPLE OMEGA-3-6-9) CAPS Take 1 capsule by mouth daily.      omeprazole (PRILOSEC) 20 MG capsule Take 20 mg by mouth 2 (two) times daily before a meal.     ondansetron (ZOFRAN-ODT) 4 MG disintegrating tablet Take 4 mg every 8 (eight) hours as needed by mouth for nausea or vomiting.     pantoprazole (PROTONIX) 40 MG tablet Take 40 mg by mouth 2 (two) times daily.     rosuvastatin (CRESTOR) 20 MG tablet Take 20 mg by mouth daily.     Tiotropium Bromide Monohydrate 1.25 MCG/ACT AERS Inhale 2 puffs into the lungs daily.     traMADol-acetaminophen (ULTRACET) 37.5-325 MG tablet Take 1 tablet by mouth every 6 (six) hours as needed. Takes twice a day     traZODone (DESYREL) 100 MG tablet Take 100 mg by mouth at bedtime.     vitamin B-12 (CYANOCOBALAMIN) 500 MCG tablet Take 500 mcg daily by mouth.     vitamin C (ASCORBIC ACID) 500 MG tablet Take 500 mg by mouth daily.      vitamin E 400 UNIT capsule Take 400 Units by mouth daily.     aspirin 81 MG tablet Take 81 mg by mouth daily. (Patient not taking: Reported on 11/29/2020)     BIDIL 20-37.5 MG tablet TAKE 1 TABLET BY MOUTH THREE TIMES DAILY (Patient taking differently: Take 1 tablet by mouth 3 (three) times daily.) 270 tablet 3   ENTRESTO 97-103 MG TAKE 1 TABLET BY MOUTH TWICE DAILY (Patient not taking: No sig reported) 180 tablet 3   fluticasone (FLONASE) 50 MCG/ACT nasal spray Place into the nose.     metoprolol (TOPROL-XL) 200 MG 24 hr tablet TAKE 1 TABLET BY MOUTH ONCE DAILY. TAKE WITH OR IMMEDIATELY FOLLOWING A MEAL. (Patient not taking: Reported on 11/29/2020) 90 tablet 3   potassium chloride (K-DUR) 10 MEQ tablet TAKE 1 TABLET BY MOUTH ONCE DAILY (Patient not taking: Reported on  11/29/2020) 90 tablet 3   predniSONE (DELTASONE) 10 MG tablet Take by mouth. (Patient not taking: No sig reported)     Semaglutide,0.25 or 0.5MG /DOS, (OZEMPIC, 0.25 OR 0.5 MG/DOSE,) 2 MG/1.5ML SOPN Inject 0.375 mLs into the skin once a week. (Patient not taking: No sig reported)     No current facility-administered medications for  this visit.     PHYSICAL EXAMINATION: ECOG PERFORMANCE STATUS: 1 - Symptomatic but completely ambulatory Vitals:   11/29/20 1331  BP: 119/78  Pulse: 74  Temp: 98.2 F (36.8 C)   Filed Weights   11/29/20 1331  Weight: 160 lb 4.8 oz (72.7 kg)    Physical Exam Constitutional:      General: She is not in acute distress. HENT:     Head: Normocephalic and atraumatic.  Eyes:     General: No scleral icterus. Cardiovascular:     Rate and Rhythm: Normal rate and regular rhythm.     Heart sounds: Normal heart sounds.  Pulmonary:     Effort: Pulmonary effort is normal. No respiratory distress.     Breath sounds: No wheezing.  Abdominal:     General: Bowel sounds are normal. There is no distension.     Palpations: Abdomen is soft.  Musculoskeletal:        General: No deformity. Normal range of motion.     Cervical back: Normal range of motion and neck supple.  Skin:    General: Skin is warm and dry.     Findings: No erythema or rash.  Neurological:     Mental Status: She is alert and oriented to person, place, and time. Mental status is at baseline.     Cranial Nerves: No cranial nerve deficit.     Coordination: Coordination normal.  Psychiatric:        Mood and Affect: Mood normal.     LABORATORY DATA:  I have reviewed the data as listed Lab Results  Component Value Date   WBC 7.4 11/25/2020   HGB 10.8 (L) 11/25/2020   HCT 34.2 (L) 11/25/2020   MCV 99.7 11/25/2020   PLT 213 11/25/2020   Recent Labs    01/06/20 1054  NA 138  K 3.7  CL 103  CO2 25  GLUCOSE 100*  BUN 16  CREATININE 1.18*  CALCIUM 9.4  GFRNONAA 48*  PROT 6.8  ALBUMIN 4.1  AST 16  ALT 13  ALKPHOS 37*  BILITOT 0.6    Iron/TIBC/Ferritin/ %Sat    Component Value Date/Time   IRON 47 11/25/2020 1055   TIBC 246 (L) 11/25/2020 1055   FERRITIN 184 11/25/2020 1055   IRONPCTSAT 19 11/25/2020 1055      ASSESSMENT & PLAN:  1. Iron deficiency anemia, unspecified iron deficiency anemia type    2. Anemia in stage 3a chronic kidney disease (HCC)    Anemia due to CKD.  And iron deficiency anemia Labs reviewed and discussed with patient .  Ferritin has improved to 184, iron saturation 19, TIBC 246. Hemoglobin has improved to 10.8. No need for erythropoietin replacement therapy today. I recommend patient to proceed with 1 dose of IV Venofer treatment today to further increase iron store.    Follow-up in February 2023  Orders Placed This Encounter  Procedures   CBC with Differential/Platelet    Standing Status:   Future    Standing Expiration Date:   11/29/2021   Ferritin    Standing Status:   Future    Standing Expiration Date:   11/29/2021  Iron and TIBC    Standing Status:   Future    Standing Expiration Date:   11/29/2021    All questions were answered. The patient knows to call the clinic with any problems questions or concerns. Ollen Gross, MD  Earlie Server, MD, PhD 11/29/2020

## 2020-11-29 NOTE — Patient Instructions (Signed)

## 2021-03-27 ENCOUNTER — Other Ambulatory Visit: Payer: Self-pay | Admitting: *Deleted

## 2021-03-27 ENCOUNTER — Encounter: Payer: Self-pay | Admitting: *Deleted

## 2021-03-27 DIAGNOSIS — D509 Iron deficiency anemia, unspecified: Secondary | ICD-10-CM

## 2021-04-03 ENCOUNTER — Inpatient Hospital Stay: Payer: Medicare Other | Attending: Oncology

## 2021-04-03 ENCOUNTER — Other Ambulatory Visit: Payer: Self-pay

## 2021-04-03 DIAGNOSIS — Z886 Allergy status to analgesic agent status: Secondary | ICD-10-CM | POA: Diagnosis not present

## 2021-04-03 DIAGNOSIS — N1831 Chronic kidney disease, stage 3a: Secondary | ICD-10-CM | POA: Insufficient documentation

## 2021-04-03 DIAGNOSIS — Z808 Family history of malignant neoplasm of other organs or systems: Secondary | ICD-10-CM | POA: Insufficient documentation

## 2021-04-03 DIAGNOSIS — D509 Iron deficiency anemia, unspecified: Secondary | ICD-10-CM

## 2021-04-03 DIAGNOSIS — Z8711 Personal history of peptic ulcer disease: Secondary | ICD-10-CM | POA: Diagnosis not present

## 2021-04-03 DIAGNOSIS — Z79899 Other long term (current) drug therapy: Secondary | ICD-10-CM | POA: Diagnosis not present

## 2021-04-03 DIAGNOSIS — Z803 Family history of malignant neoplasm of breast: Secondary | ICD-10-CM | POA: Diagnosis not present

## 2021-04-03 DIAGNOSIS — Z801 Family history of malignant neoplasm of trachea, bronchus and lung: Secondary | ICD-10-CM | POA: Insufficient documentation

## 2021-04-03 DIAGNOSIS — Z8249 Family history of ischemic heart disease and other diseases of the circulatory system: Secondary | ICD-10-CM | POA: Diagnosis not present

## 2021-04-03 DIAGNOSIS — Z9049 Acquired absence of other specified parts of digestive tract: Secondary | ICD-10-CM | POA: Insufficient documentation

## 2021-04-03 DIAGNOSIS — Z880 Allergy status to penicillin: Secondary | ICD-10-CM | POA: Insufficient documentation

## 2021-04-03 DIAGNOSIS — E611 Iron deficiency: Secondary | ICD-10-CM | POA: Diagnosis not present

## 2021-04-03 DIAGNOSIS — Z818 Family history of other mental and behavioral disorders: Secondary | ICD-10-CM | POA: Diagnosis not present

## 2021-04-03 DIAGNOSIS — D631 Anemia in chronic kidney disease: Secondary | ICD-10-CM | POA: Diagnosis present

## 2021-04-03 LAB — CBC WITH DIFFERENTIAL/PLATELET
Abs Immature Granulocytes: 0.02 10*3/uL (ref 0.00–0.07)
Basophils Absolute: 0.1 10*3/uL (ref 0.0–0.1)
Basophils Relative: 1 %
Eosinophils Absolute: 0.2 10*3/uL (ref 0.0–0.5)
Eosinophils Relative: 4 %
HCT: 32.7 % — ABNORMAL LOW (ref 36.0–46.0)
Hemoglobin: 10.7 g/dL — ABNORMAL LOW (ref 12.0–15.0)
Immature Granulocytes: 0 %
Lymphocytes Relative: 34 %
Lymphs Abs: 2 10*3/uL (ref 0.7–4.0)
MCH: 32.6 pg (ref 26.0–34.0)
MCHC: 32.7 g/dL (ref 30.0–36.0)
MCV: 99.7 fL (ref 80.0–100.0)
Monocytes Absolute: 0.5 10*3/uL (ref 0.1–1.0)
Monocytes Relative: 8 %
Neutro Abs: 3.3 10*3/uL (ref 1.7–7.7)
Neutrophils Relative %: 53 %
Platelets: 223 10*3/uL (ref 150–400)
RBC: 3.28 MIL/uL — ABNORMAL LOW (ref 3.87–5.11)
RDW: 13.4 % (ref 11.5–15.5)
WBC: 6 10*3/uL (ref 4.0–10.5)
nRBC: 0 % (ref 0.0–0.2)

## 2021-04-03 LAB — IRON AND TIBC
Iron: 55 ug/dL (ref 28–170)
Saturation Ratios: 21 % (ref 10.4–31.8)
TIBC: 267 ug/dL (ref 250–450)
UIBC: 212 ug/dL

## 2021-04-03 LAB — FERRITIN: Ferritin: 225 ng/mL (ref 11–307)

## 2021-04-06 ENCOUNTER — Other Ambulatory Visit: Payer: Self-pay

## 2021-04-06 ENCOUNTER — Inpatient Hospital Stay: Payer: Medicare Other

## 2021-04-06 ENCOUNTER — Inpatient Hospital Stay (HOSPITAL_BASED_OUTPATIENT_CLINIC_OR_DEPARTMENT_OTHER): Payer: Medicare Other | Admitting: Oncology

## 2021-04-06 ENCOUNTER — Encounter: Payer: Self-pay | Admitting: Oncology

## 2021-04-06 VITALS — BP 143/79 | HR 87 | Temp 98.2°F | Wt 168.0 lb

## 2021-04-06 DIAGNOSIS — D631 Anemia in chronic kidney disease: Secondary | ICD-10-CM | POA: Diagnosis not present

## 2021-04-06 DIAGNOSIS — N1831 Chronic kidney disease, stage 3a: Secondary | ICD-10-CM

## 2021-04-06 DIAGNOSIS — D509 Iron deficiency anemia, unspecified: Secondary | ICD-10-CM

## 2021-04-06 NOTE — Progress Notes (Signed)
Hematology/Oncology Progress note [phone] [phone]      Patient Care Team: Tracie Harrier, MD as PCP - General (Internal Medicine) Isaias Cowman, MD as Consulting Physician (Cardiology) Alisa Graff, FNP as Nurse Practitioner (Family Medicine) Earlie Server, MD as Consulting Physician (Oncology)  REFERRING PROVIDER: Tracie Harrier, MD  CHIEF COMPLAINTS/REASON FOR VISIT:  Anemia   HISTORY OF PRESENTING ILLNESS:  Diane Patton is a  76 y.o.  female with PMH listed below who was referred to me for evaluation of anemia Reviewed patient's recent labs that was done.  05/25/2019 labs revealed anemia with hemoglobin of 10.6 Reviewed patient's previous labs ordered by primary care physician's office, anemia is chronic onset , duration is since at least 2015.  Associated signs and symptoms: Patient reports fatigue.  Denies SOB with exertion.  Denies weight loss, easy bruising, hematochezia, hemoptysis, hematuria. Context: History of GI bleeding: Denies               History of Chronic kidney disease; denies               History of autoimmune disease: Denies               History of hemolytic anemia.  Denies               Last colonoscopy: March 2021 EGD showed submucosal gastric antrum mass with biopsy showed mild nonspecific chronic gastritis.  Negative for active inflammation and H. pylori.  Negative for dysplasia or malignancy.  May 2018 colonoscopy showed diverticulosis in the sigmoid colon, ascending and descending colon and transverse colon                Also noticed that patient had chronic calcium since April 2020.  Calcium levels at 11.3 on 05/25/2019. 05/25/2019, ferritin 8.  Folic acid> 81.0, vitamin B12 565.   INTERVAL HISTORY Diane Patton is a 76 y.o. female who has above history reviewed by me today presents for follow up visit for management of anemia  Patient has received IV Venofer treatments. Patient reports feeling well.  No new  complaints.   Review of Systems  Constitutional:  Positive for fatigue. Negative for appetite change, chills and fever.  HENT:   Negative for hearing loss and voice change.   Eyes:  Negative for eye problems.  Respiratory:  Negative for chest tightness and cough.   Cardiovascular:  Negative for chest pain.  Gastrointestinal:  Negative for abdominal distention, abdominal pain and blood in stool.  Endocrine: Negative for hot flashes.  Genitourinary:  Negative for difficulty urinating and frequency.   Musculoskeletal:  Negative for arthralgias.  Skin:  Negative for itching and rash.  Neurological:  Negative for extremity weakness.  Hematological:  Negative for adenopathy.  Psychiatric/Behavioral:  Negative for confusion.     MEDICAL HISTORY:  Past Medical History:  Diagnosis Date   Asthma    Cardiomyopathy, nonischemic (HCC)    CHF (congestive heart failure) (HCC)    COPD (chronic obstructive pulmonary disease) (HCC)    Diabetes mellitus, type 2 (HCC)    Gastric ulcer without hemorrhage or perforation    GERD (gastroesophageal reflux disease)    HTN (hypertension)    Hyperlipidemia    IDA (iron deficiency anemia) 04/05/2020   Obstructive sleep apnea on CPAP    Osteoarthritis    osteo of both knees   Scoliosis     SURGICAL HISTORY: Past Surgical History:  Procedure Laterality Date   ABDOMINAL HYSTERECTOMY     ankle (  other)     APPENDECTOMY     BREAST BIOPSY Right 1999   neg   BREAST CYST ASPIRATION     neg   BREAST SURGERY     CARDIAC CATHETERIZATION     CLAVICLE SURGERY Right    Fx   COLONOSCOPY WITH PROPOFOL N/A 07/10/2016   Procedure: COLONOSCOPY WITH PROPOFOL;  Surgeon: Lollie Sails, MD;  Location: [facility] ENDOSCOPY;  Service: Endoscopy;  Laterality: N/A;   ear (otheR)     ESOPHAGOGASTRODUODENOSCOPY N/A 05/11/2019   Procedure: ESOPHAGOGASTRODUODENOSCOPY (EGD);  Surgeon: Toledo, Benay Pike, MD;  Location: [facility] ENDOSCOPY;  Service: Gastroenterology;  Laterality:  N/A;   ESOPHAGOGASTRODUODENOSCOPY (EGD) WITH PROPOFOL N/A 07/10/2016   Procedure: ESOPHAGOGASTRODUODENOSCOPY (EGD) WITH PROPOFOL;  Surgeon: Lollie Sails, MD;  Location: [facility] ENDOSCOPY;  Service: Endoscopy;  Laterality: N/A;   ESOPHAGOGASTRODUODENOSCOPY (EGD) WITH PROPOFOL N/A 09/17/2017   Procedure: ESOPHAGOGASTRODUODENOSCOPY (EGD) WITH PROPOFOL;  Surgeon: Lollie Sails, MD;  Location: [facility] ENDOSCOPY;  Service: Endoscopy;  Laterality: N/A;   feet (other)     HERNIA REPAIR     sinus (other)     stomach     UPPER ESOPHAGEAL ENDOSCOPIC ULTRASOUND (EUS) N/A 11/22/2016   Procedure: UPPER ESOPHAGEAL ENDOSCOPIC ULTRASOUND (EUS);  Surgeon: Reita Cliche, MD;  Location: [facility] ENDOSCOPY;  Service: Gastroenterology;  Laterality: N/A;    SOCIAL HISTORY: Social History   Socioeconomic History   Marital status: Married    Spouse name: Not on file   Number of children: Not on file   Years of education: Not on file   Highest education level: Not on file  Occupational History   Occupation: retired  Tobacco Use   Smoking status: Former    Packs/day: 1.00    Years: 15.00    Pack years: 15.00    Types: Cigarettes    Quit date: 07/21/1982    Years since quitting: 38.7   Smokeless tobacco: Never  Vaping Use   Vaping Use: Never used  Substance and Sexual Activity   Alcohol use: No    Alcohol/week: 15.0 standard drinks    Types: 15 Standard drinks or equivalent per week    Comment: quit drinking 04/1981   Drug use: No   Sexual activity: Not on file  Other Topics Concern   Not on file  Social History Narrative   Disabled. Regularly exercises.    Social Determinants of Health   Financial Resource Strain: Not on file  Food Insecurity: Not on file  Transportation Needs: Not on file  Physical Activity: Not on file  Stress: Not on file  Social Connections: Not on file  Intimate Partner Violence: Not on file    FAMILY HISTORY: Family History  Problem Relation Age of Onset    Alzheimer's disease Other    Heart attack Mother    Hypertension Mother    Cancer Brother        lung   Cancer Maternal Aunt        mouth and breast   Breast cancer Maternal Aunt    Cancer Daughter        throat   Dementia Father     ALLERGIES:  is allergic to amoxicillin, aspirin, celebrex [celecoxib], latex, penicillins, and allegra [fexofenadine].  MEDICATIONS:  Current Outpatient Medications  Medication Sig Dispense Refill   albuterol (PROVENTIL HFA;VENTOLIN HFA) 108 (90 BASE) MCG/ACT inhaler Inhale 2 puffs into the lungs every 6 (six) hours as needed for wheezing or shortness of breath.     aspirin 81  MG tablet Take 81 mg by mouth daily. (Patient not taking: Reported on 11/29/2020)     BIDIL 20-37.5 MG tablet TAKE 1 TABLET BY MOUTH THREE TIMES DAILY (Patient taking differently: Take 1 tablet by mouth 3 (three) times daily.) 270 tablet 3   Biotin 1000 MCG tablet Take 1,000 mcg by mouth daily.      calcium carbonate (OS-CAL) 600 MG TABS tablet Take 600 mg by mouth 2 (two) times daily with a meal.      cetirizine (ZYRTEC) 10 MG tablet Take 10 mg by mouth daily.     cholecalciferol (VITAMIN D) 400 units TABS tablet Take 2,000 Units by mouth daily.      Cod Liver Oil w/Vit A & D CAPS Take 1 capsule by mouth daily.     ENTRESTO 97-103 MG TAKE 1 TABLET BY MOUTH TWICE DAILY (Patient not taking: Reported on 08/16/2020) 180 tablet 3   fluticasone (FLONASE) 50 MCG/ACT nasal spray Place into the nose.     Fluticasone-Salmeterol (ADVAIR) 500-50 MCG/DOSE AEPB Inhale 1 puff into the lungs 2 (two) times daily.     furosemide (LASIX) 40 MG tablet Take 40 mg by mouth daily.     glimepiride (AMARYL) 2 MG tablet Take 2 mg by mouth daily with breakfast.     Iron-Vitamin C (VITRON-C) 65-125 MG TABS Take 1 tablet by mouth daily. 90 tablet 1   levocetirizine (XYZAL) 5 MG tablet Take 5 mg by mouth every evening.     meloxicam (MOBIC) 7.5 MG tablet Take 7.5 mg by mouth 2 (two) times daily as needed for  pain. Takes one every other day     metFORMIN (GLUCOPHAGE) 500 MG tablet Take 1,000 mg by mouth 2 (two) times daily with a meal.     metoprolol (TOPROL-XL) 200 MG 24 hr tablet TAKE 1 TABLET BY MOUTH ONCE DAILY. TAKE WITH OR IMMEDIATELY FOLLOWING A MEAL. (Patient not taking: Reported on 11/29/2020) 90 tablet 3   montelukast (SINGULAIR) 10 MG tablet Take 10 mg by mouth at bedtime.     Multiple Vitamin (MULTIVITAMIN) tablet Take 1 tablet by mouth daily.      Omega 3-6-9 Fatty Acids (TRIPLE OMEGA-3-6-9) CAPS Take 1 capsule by mouth daily.     omeprazole (PRILOSEC) 20 MG capsule Take 20 mg by mouth 2 (two) times daily before a meal.     ondansetron (ZOFRAN-ODT) 4 MG disintegrating tablet Take 4 mg every 8 (eight) hours as needed by mouth for nausea or vomiting.     pantoprazole (PROTONIX) 40 MG tablet Take 40 mg by mouth 2 (two) times daily.     potassium chloride (K-DUR) 10 MEQ tablet TAKE 1 TABLET BY MOUTH ONCE DAILY (Patient not taking: Reported on 11/29/2020) 90 tablet 3   predniSONE (DELTASONE) 10 MG tablet Take by mouth. (Patient not taking: Reported on 08/16/2020)     rosuvastatin (CRESTOR) 20 MG tablet Take 20 mg by mouth daily.     Semaglutide,0.25 or 0.5MG /DOS, (OZEMPIC, 0.25 OR 0.5 MG/DOSE,) 2 MG/1.5ML SOPN Inject 0.375 mLs into the skin once a week. (Patient not taking: Reported on 04/08/2020)     Tiotropium Bromide Monohydrate 1.25 MCG/ACT AERS Inhale 2 puffs into the lungs daily.     traMADol-acetaminophen (ULTRACET) 37.5-325 MG tablet Take 1 tablet by mouth every 6 (six) hours as needed. Takes twice a day     traZODone (DESYREL) 100 MG tablet Take 100 mg by mouth at bedtime.     vitamin B-12 (CYANOCOBALAMIN) 500 MCG tablet Take  500 mcg daily by mouth.     vitamin C (ASCORBIC ACID) 500 MG tablet Take 500 mg by mouth daily.      vitamin E 400 UNIT capsule Take 400 Units by mouth daily.     No current facility-administered medications for this visit.     PHYSICAL EXAMINATION: ECOG  PERFORMANCE STATUS: 1 - Symptomatic but completely ambulatory Vitals:   04/06/21 1322  BP: (!) 143/79  Pulse: 87  Temp: 98.2 F (36.8 C)   Filed Weights   04/06/21 1322  Weight: 168 lb (76.2 kg)    Physical Exam Constitutional:      General: She is not in acute distress. HENT:     Head: Normocephalic and atraumatic.  Eyes:     General: No scleral icterus. Cardiovascular:     Rate and Rhythm: Normal rate and regular rhythm.     Heart sounds: Normal heart sounds.  Pulmonary:     Effort: Pulmonary effort is normal. No respiratory distress.     Breath sounds: No wheezing.  Abdominal:     General: Bowel sounds are normal. There is no distension.     Palpations: Abdomen is soft.  Musculoskeletal:        General: No deformity. Normal range of motion.     Cervical back: Normal range of motion and neck supple.  Skin:    General: Skin is warm and dry.     Findings: No erythema or rash.  Neurological:     Mental Status: She is alert and oriented to person, place, and time. Mental status is at baseline.     Cranial Nerves: No cranial nerve deficit.     Coordination: Coordination normal.  Psychiatric:        Mood and Affect: Mood normal.     LABORATORY DATA:  I have reviewed the data as listed Lab Results  Component Value Date   WBC 6.0 04/03/2021   HGB 10.7 (L) 04/03/2021   HCT 32.7 (L) 04/03/2021   MCV 99.7 04/03/2021   PLT 223 04/03/2021   No results for input(s): NA, K, CL, CO2, GLUCOSE, BUN, CREATININE, CALCIUM, GFRNONAA, GFRAA, PROT, ALBUMIN, AST, ALT, ALKPHOS, BILITOT, BILIDIR, IBILI in the last 8760 hours.  Iron/TIBC/Ferritin/ %Sat    Component Value Date/Time   IRON 55 04/03/2021 1016   TIBC 267 04/03/2021 1016   FERRITIN 225 04/03/2021 1016   IRONPCTSAT 21 04/03/2021 1016      ASSESSMENT & PLAN:  1. Iron deficiency anemia, unspecified iron deficiency anemia type   2. Anemia in stage 3a chronic kidney disease (HCC)    Anemia due to CKD.  And iron  deficiency anemia Labs reviewed and discussed with patient. Hemoglobin remains stable above 10 Iron panel showed ferritin level of 225. Hold additional IV iron at this point. Recommend patient to continue Vitron-C 1 tablet daily.  Follow-up in 4 months.     All questions were answered. The patient knows to call the clinic with any problems questions or concerns. Ollen Gross, MD  Earlie Server, MD, PhD 04/06/2021

## 2021-07-30 ENCOUNTER — Emergency Department: Payer: Medicare Other

## 2021-07-30 ENCOUNTER — Inpatient Hospital Stay: Payer: Medicare Other

## 2021-07-30 ENCOUNTER — Inpatient Hospital Stay
Admission: EM | Admit: 2021-07-30 | Discharge: 2021-08-11 | DRG: 871 | Disposition: A | Discharge status: Skilled Nursing Facility | Payer: Medicare Other | Attending: Internal Medicine | Admitting: Internal Medicine

## 2021-07-30 DIAGNOSIS — H9202 Otalgia, left ear: Secondary | ICD-10-CM | POA: Diagnosis present

## 2021-07-30 DIAGNOSIS — K219 Gastro-esophageal reflux disease without esophagitis: Secondary | ICD-10-CM | POA: Diagnosis present

## 2021-07-30 DIAGNOSIS — N1832 Chronic kidney disease, stage 3b: Secondary | ICD-10-CM | POA: Diagnosis present

## 2021-07-30 DIAGNOSIS — I447 Left bundle-branch block, unspecified: Secondary | ICD-10-CM | POA: Diagnosis present

## 2021-07-30 DIAGNOSIS — R4182 Altered mental status, unspecified: Principal | ICD-10-CM

## 2021-07-30 DIAGNOSIS — A403 Sepsis due to Streptococcus pneumoniae: Secondary | ICD-10-CM | POA: Diagnosis present

## 2021-07-30 DIAGNOSIS — J449 Chronic obstructive pulmonary disease, unspecified: Secondary | ICD-10-CM | POA: Diagnosis present

## 2021-07-30 DIAGNOSIS — I959 Hypotension, unspecified: Secondary | ICD-10-CM | POA: Diagnosis not present

## 2021-07-30 DIAGNOSIS — G039 Meningitis, unspecified: Secondary | ICD-10-CM | POA: Diagnosis not present

## 2021-07-30 DIAGNOSIS — I611 Nontraumatic intracerebral hemorrhage in hemisphere, cortical: Secondary | ICD-10-CM | POA: Diagnosis present

## 2021-07-30 DIAGNOSIS — R6520 Severe sepsis without septic shock: Secondary | ICD-10-CM | POA: Diagnosis not present

## 2021-07-30 DIAGNOSIS — Z1611 Resistance to penicillins: Secondary | ICD-10-CM | POA: Diagnosis present

## 2021-07-30 DIAGNOSIS — G009 Bacterial meningitis, unspecified: Secondary | ICD-10-CM | POA: Diagnosis not present

## 2021-07-30 DIAGNOSIS — E119 Type 2 diabetes mellitus without complications: Secondary | ICD-10-CM

## 2021-07-30 DIAGNOSIS — Z7984 Long term (current) use of oral hypoglycemic drugs: Secondary | ICD-10-CM

## 2021-07-30 DIAGNOSIS — M1120 Other chondrocalcinosis, unspecified site: Secondary | ICD-10-CM | POA: Diagnosis not present

## 2021-07-30 DIAGNOSIS — J9601 Acute respiratory failure with hypoxia: Secondary | ICD-10-CM | POA: Diagnosis present

## 2021-07-30 DIAGNOSIS — I10 Essential (primary) hypertension: Secondary | ICD-10-CM | POA: Diagnosis not present

## 2021-07-30 DIAGNOSIS — R233 Spontaneous ecchymoses: Secondary | ICD-10-CM | POA: Diagnosis present

## 2021-07-30 DIAGNOSIS — G9340 Encephalopathy, unspecified: Secondary | ICD-10-CM

## 2021-07-30 DIAGNOSIS — I5022 Chronic systolic (congestive) heart failure: Secondary | ICD-10-CM | POA: Diagnosis present

## 2021-07-30 DIAGNOSIS — E1151 Type 2 diabetes mellitus with diabetic peripheral angiopathy without gangrene: Secondary | ICD-10-CM | POA: Diagnosis present

## 2021-07-30 DIAGNOSIS — N179 Acute kidney failure, unspecified: Secondary | ICD-10-CM | POA: Diagnosis present

## 2021-07-30 DIAGNOSIS — M11262 Other chondrocalcinosis, left knee: Secondary | ICD-10-CM | POA: Diagnosis present

## 2021-07-30 DIAGNOSIS — D631 Anemia in chronic kidney disease: Secondary | ICD-10-CM | POA: Diagnosis present

## 2021-07-30 DIAGNOSIS — H7093 Unspecified mastoiditis, bilateral: Secondary | ICD-10-CM | POA: Diagnosis not present

## 2021-07-30 DIAGNOSIS — B953 Streptococcus pneumoniae as the cause of diseases classified elsewhere: Secondary | ICD-10-CM | POA: Diagnosis not present

## 2021-07-30 DIAGNOSIS — I255 Ischemic cardiomyopathy: Secondary | ICD-10-CM | POA: Diagnosis present

## 2021-07-30 DIAGNOSIS — R7881 Bacteremia: Secondary | ICD-10-CM | POA: Diagnosis not present

## 2021-07-30 DIAGNOSIS — I130 Hypertensive heart and chronic kidney disease with heart failure and stage 1 through stage 4 chronic kidney disease, or unspecified chronic kidney disease: Secondary | ICD-10-CM | POA: Diagnosis present

## 2021-07-30 DIAGNOSIS — G928 Other toxic encephalopathy: Secondary | ICD-10-CM | POA: Diagnosis present

## 2021-07-30 DIAGNOSIS — R6521 Severe sepsis with septic shock: Secondary | ICD-10-CM | POA: Diagnosis present

## 2021-07-30 DIAGNOSIS — A419 Sepsis, unspecified organism: Secondary | ICD-10-CM

## 2021-07-30 DIAGNOSIS — R7401 Elevation of levels of liver transaminase levels: Secondary | ICD-10-CM | POA: Diagnosis not present

## 2021-07-30 DIAGNOSIS — E785 Hyperlipidemia, unspecified: Secondary | ICD-10-CM | POA: Diagnosis present

## 2021-07-30 DIAGNOSIS — Z886 Allergy status to analgesic agent status: Secondary | ICD-10-CM

## 2021-07-30 DIAGNOSIS — G9341 Metabolic encephalopathy: Secondary | ICD-10-CM | POA: Diagnosis not present

## 2021-07-30 DIAGNOSIS — H9190 Unspecified hearing loss, unspecified ear: Secondary | ICD-10-CM | POA: Diagnosis present

## 2021-07-30 DIAGNOSIS — G4733 Obstructive sleep apnea (adult) (pediatric): Secondary | ICD-10-CM | POA: Diagnosis present

## 2021-07-30 DIAGNOSIS — Z8249 Family history of ischemic heart disease and other diseases of the circulatory system: Secondary | ICD-10-CM

## 2021-07-30 DIAGNOSIS — I428 Other cardiomyopathies: Secondary | ICD-10-CM

## 2021-07-30 DIAGNOSIS — Z79899 Other long term (current) drug therapy: Secondary | ICD-10-CM

## 2021-07-30 DIAGNOSIS — Z91040 Latex allergy status: Secondary | ICD-10-CM

## 2021-07-30 DIAGNOSIS — Z880 Allergy status to penicillin: Secondary | ICD-10-CM

## 2021-07-30 DIAGNOSIS — Z881 Allergy status to other antibiotic agents status: Secondary | ICD-10-CM

## 2021-07-30 DIAGNOSIS — I5023 Acute on chronic systolic (congestive) heart failure: Secondary | ICD-10-CM | POA: Diagnosis present

## 2021-07-30 DIAGNOSIS — Z20822 Contact with and (suspected) exposure to covid-19: Secondary | ICD-10-CM | POA: Diagnosis present

## 2021-07-30 DIAGNOSIS — R001 Bradycardia, unspecified: Secondary | ICD-10-CM | POA: Diagnosis not present

## 2021-07-30 DIAGNOSIS — N189 Chronic kidney disease, unspecified: Secondary | ICD-10-CM | POA: Diagnosis present

## 2021-07-30 DIAGNOSIS — I471 Supraventricular tachycardia: Secondary | ICD-10-CM | POA: Diagnosis not present

## 2021-07-30 DIAGNOSIS — M419 Scoliosis, unspecified: Secondary | ICD-10-CM | POA: Diagnosis present

## 2021-07-30 DIAGNOSIS — M11261 Other chondrocalcinosis, right knee: Secondary | ICD-10-CM | POA: Diagnosis present

## 2021-07-30 DIAGNOSIS — Z9049 Acquired absence of other specified parts of digestive tract: Secondary | ICD-10-CM

## 2021-07-30 DIAGNOSIS — Z87891 Personal history of nicotine dependence: Secondary | ICD-10-CM

## 2021-07-30 DIAGNOSIS — J9602 Acute respiratory failure with hypercapnia: Secondary | ICD-10-CM | POA: Diagnosis present

## 2021-07-30 DIAGNOSIS — G001 Pneumococcal meningitis: Secondary | ICD-10-CM | POA: Diagnosis present

## 2021-07-30 DIAGNOSIS — Z888 Allergy status to other drugs, medicaments and biological substances status: Secondary | ICD-10-CM

## 2021-07-30 LAB — COMPREHENSIVE METABOLIC PANEL
ALT: 13 U/L (ref 0–44)
AST: 20 U/L (ref 15–41)
Albumin: 3.8 g/dL (ref 3.5–5.0)
Alkaline Phosphatase: 39 U/L (ref 38–126)
Anion gap: 6 (ref 5–15)
BUN: 22 mg/dL (ref 8–23)
CO2: 27 mmol/L (ref 22–32)
Calcium: 9.3 mg/dL (ref 8.9–10.3)
Chloride: 107 mmol/L (ref 98–111)
Creatinine, Ser: 1.38 mg/dL — ABNORMAL HIGH (ref 0.44–1.00)
GFR, Estimated: 39 mL/min — ABNORMAL LOW (ref >60)
Glucose, Bld: 195 mg/dL — ABNORMAL HIGH (ref 70–99)
Potassium: 3.7 mmol/L (ref 3.5–5.1)
Sodium: 140 mmol/L (ref 135–145)
Total Bilirubin: 0.9 mg/dL (ref 0.3–1.2)
Total Protein: 6.4 g/dL — ABNORMAL LOW (ref 6.5–8.1)

## 2021-07-30 LAB — URINALYSIS, COMPLETE (UACMP) WITH MICROSCOPIC
Bilirubin Urine: NEGATIVE
Glucose, UA: NEGATIVE mg/dL
Hgb urine dipstick: NEGATIVE
Ketones, ur: NEGATIVE mg/dL
Leukocytes,Ua: NEGATIVE
Nitrite: NEGATIVE
Protein, ur: 100 mg/dL — AB
Specific Gravity, Urine: 1.019 (ref 1.005–1.030)
Squamous Epithelial / HPF: NONE SEEN (ref 0–5)
pH: 5 (ref 5.0–8.0)

## 2021-07-30 LAB — LACTIC ACID, PLASMA
Lactic Acid, Venous: 2.2 mmol/L (ref 0.5–1.9)
Lactic Acid, Venous: 3.5 mmol/L (ref 0.5–1.9)

## 2021-07-30 LAB — CBC WITH DIFFERENTIAL/PLATELET
Abs Immature Granulocytes: 0.04 K/uL (ref 0.00–0.07)
Basophils Absolute: 0 K/uL (ref 0.0–0.1)
Basophils Relative: 0 %
Eosinophils Absolute: 0.1 K/uL (ref 0.0–0.5)
Eosinophils Relative: 0 %
HCT: 31.5 % — ABNORMAL LOW (ref 36.0–46.0)
Hemoglobin: 10 g/dL — ABNORMAL LOW (ref 12.0–15.0)
Immature Granulocytes: 0 %
Lymphocytes Relative: 12 %
Lymphs Abs: 1.4 K/uL (ref 0.7–4.0)
MCH: 30.9 pg (ref 26.0–34.0)
MCHC: 31.7 g/dL (ref 30.0–36.0)
MCV: 97.2 fL (ref 80.0–100.0)
Monocytes Absolute: 0.5 K/uL (ref 0.1–1.0)
Monocytes Relative: 4 %
Neutro Abs: 9.3 K/uL — ABNORMAL HIGH (ref 1.7–7.7)
Neutrophils Relative %: 84 %
Platelets: 172 K/uL (ref 150–400)
RBC: 3.24 MIL/uL — ABNORMAL LOW (ref 3.87–5.11)
RDW: 12.7 % (ref 11.5–15.5)
WBC: 11.3 K/uL — ABNORMAL HIGH (ref 4.0–10.5)
nRBC: 0 % (ref 0.0–0.2)

## 2021-07-30 LAB — BRAIN NATRIURETIC PEPTIDE: B Natriuretic Peptide: 486.9 pg/mL — ABNORMAL HIGH (ref 0.0–100.0)

## 2021-07-30 LAB — BLOOD GAS, ARTERIAL
Acid-base deficit: 1.6 mmol/L (ref 0.0–2.0)
Bicarbonate: 21.7 mmol/L (ref 20.0–28.0)
O2 Content: 6 L/min
O2 Saturation: 99.5 %
Patient temperature: 37
pCO2 arterial: 32 mmHg (ref 32–48)
pH, Arterial: 7.44 (ref 7.35–7.45)
pO2, Arterial: 119 mmHg — ABNORMAL HIGH (ref 83–108)

## 2021-07-30 LAB — RESP PANEL BY RT-PCR (FLU A&B, COVID) ARPGX2
Influenza A by PCR: NEGATIVE
Influenza B by PCR: NEGATIVE
SARS Coronavirus 2 by RT PCR: NEGATIVE

## 2021-07-30 LAB — PROTIME-INR
INR: 1.1 (ref 0.8–1.2)
Prothrombin Time: 14.2 seconds (ref 11.4–15.2)

## 2021-07-30 LAB — APTT: aPTT: 32 seconds (ref 24–36)

## 2021-07-30 LAB — PROCALCITONIN: Procalcitonin: <0.1 ng/mL

## 2021-07-30 MED ORDER — ACETAMINOPHEN 325 MG RE SUPP
650.0000 mg | Freq: Once | RECTAL | Status: AC
  Administered 2021-07-30: 650 mg via RECTAL
  Filled 2021-07-30: qty 2

## 2021-07-30 MED ORDER — VANCOMYCIN HCL IN DEXTROSE 1-5 GM/200ML-% IV SOLN: 1000.0000 mg | INTRAVENOUS | Status: DC

## 2021-07-30 MED ORDER — SODIUM CHLORIDE 0.9 % IV SOLN
2.0000 g | Freq: Four times a day (QID) | INTRAVENOUS | Status: DC
  Administered 2021-07-31 (×2): 2 g via INTRAVENOUS
  Filled 2021-07-30: qty 2
  Filled 2021-07-30: qty 2000
  Filled 2021-07-30: qty 2

## 2021-07-30 MED ORDER — SODIUM CHLORIDE 0.9 % IV SOLN
2.0000 g | Freq: Two times a day (BID) | INTRAVENOUS | Status: DC
  Administered 2021-07-31 – 2021-08-11 (×23): 2 g via INTRAVENOUS
  Filled 2021-07-30: qty 2
  Filled 2021-07-30 (×2): qty 20
  Filled 2021-07-30: qty 2
  Filled 2021-07-30: qty 20
  Filled 2021-07-30: qty 2
  Filled 2021-07-30 (×2): qty 20
  Filled 2021-07-30 (×5): qty 2
  Filled 2021-07-30: qty 20
  Filled 2021-07-30: qty 2
  Filled 2021-07-30: qty 20
  Filled 2021-07-30 (×2): qty 2
  Filled 2021-07-30 (×3): qty 20
  Filled 2021-07-30: qty 2
  Filled 2021-07-30: qty 20
  Filled 2021-07-30 (×2): qty 2

## 2021-07-30 MED ORDER — SODIUM CHLORIDE 0.9 % IV SOLN
2.0000 g | Freq: Two times a day (BID) | INTRAVENOUS | Status: DC
  Filled 2021-07-30: qty 12.5

## 2021-07-30 MED ORDER — INSULIN ASPART 100 UNIT/ML IJ SOLN
0.0000 [IU] | INTRAMUSCULAR | Status: DC
  Administered 2021-07-30 – 2021-07-31 (×2): 5 [IU] via SUBCUTANEOUS
  Filled 2021-07-30 (×2): qty 1

## 2021-07-30 MED ORDER — ACETAMINOPHEN 325 MG PO TABS
650.0000 mg | ORAL_TABLET | Freq: Four times a day (QID) | ORAL | Status: DC | PRN
  Administered 2021-08-03 – 2021-08-11 (×8): 650 mg via ORAL
  Filled 2021-07-30 (×8): qty 2

## 2021-07-30 MED ORDER — LACTATED RINGERS IV BOLUS
500.0000 mL | Freq: Once | INTRAVENOUS | Status: AC
  Administered 2021-07-30: 500 mL via INTRAVENOUS

## 2021-07-30 MED ORDER — METRONIDAZOLE 500 MG/100ML IV SOLN
500.0000 mg | Freq: Once | INTRAVENOUS | Status: AC
Start: 2021-07-30 — End: 2021-07-30
  Administered 2021-07-30: 500 mg via INTRAVENOUS
  Filled 2021-07-30: qty 100

## 2021-07-30 MED ORDER — LACTATED RINGERS IV SOLN: INTRAVENOUS | Status: DC

## 2021-07-30 MED ORDER — VANCOMYCIN HCL IN DEXTROSE 1-5 GM/200ML-% IV SOLN
1000.0000 mg | Freq: Once | INTRAVENOUS | Status: AC
Start: 2021-07-30 — End: 2021-07-30
  Administered 2021-07-30: 1000 mg via INTRAVENOUS
  Filled 2021-07-30: qty 200

## 2021-07-30 MED ORDER — SODIUM CHLORIDE 0.9 % IV SOLN
2.0000 g | Freq: Once | INTRAVENOUS | Status: AC
  Administered 2021-07-30: 2 g via INTRAVENOUS
  Filled 2021-07-30: qty 12.5

## 2021-07-30 MED ORDER — ACETAMINOPHEN 650 MG RE SUPP
325.0000 mg | Freq: Once | RECTAL | Status: AC
  Administered 2021-07-31: 325 mg via RECTAL
  Filled 2021-07-30: qty 1

## 2021-07-30 MED ORDER — ALBUTEROL SULFATE HFA 108 (90 BASE) MCG/ACT IN AERS: 2.0000 | INHALATION_SPRAY | Freq: Four times a day (QID) | RESPIRATORY_TRACT | Status: DC | PRN

## 2021-07-30 MED ORDER — METRONIDAZOLE 500 MG/100ML IV SOLN
500.0000 mg | Freq: Two times a day (BID) | INTRAVENOUS | Status: DC
  Filled 2021-07-30: qty 100

## 2021-07-30 MED ORDER — ONDANSETRON HCL 4 MG PO TABS: 4.0000 mg | ORAL_TABLET | Freq: Four times a day (QID) | ORAL | Status: DC | PRN

## 2021-07-30 MED ORDER — ENOXAPARIN SODIUM 40 MG/0.4ML IJ SOSY: 40.0000 mg | PREFILLED_SYRINGE | INTRAMUSCULAR | Status: DC

## 2021-07-30 MED ORDER — SODIUM CHLORIDE 0.9 % IV BOLUS (SEPSIS)
1000.0000 mL | Freq: Once | INTRAVENOUS | Status: AC
  Administered 2021-07-30: 1000 mL via INTRAVENOUS

## 2021-07-30 MED ORDER — ALBUTEROL SULFATE (2.5 MG/3ML) 0.083% IN NEBU: 2.5000 mg | INHALATION_SOLUTION | Freq: Four times a day (QID) | RESPIRATORY_TRACT | Status: DC | PRN

## 2021-07-30 MED ORDER — ACETAMINOPHEN 650 MG RE SUPP: 650.0000 mg | Freq: Four times a day (QID) | RECTAL | Status: DC | PRN

## 2021-07-30 MED ORDER — ONDANSETRON HCL 4 MG/2ML IJ SOLN
4.0000 mg | Freq: Four times a day (QID) | INTRAMUSCULAR | Status: DC | PRN
  Administered 2021-08-04: 4 mg via INTRAVENOUS
  Filled 2021-07-30: qty 2

## 2021-07-30 MED ORDER — VANCOMYCIN HCL 750 MG/150ML IV SOLN
750.0000 mg | Freq: Once | INTRAVENOUS | Status: AC
  Administered 2021-07-30: 750 mg via INTRAVENOUS
  Filled 2021-07-30: qty 150

## 2021-07-30 MED ORDER — SODIUM CHLORIDE 0.9 % IV SOLN: 2.0000 g | Freq: Once | INTRAVENOUS | Status: DC

## 2021-07-30 MED ORDER — DEXTROSE 5 % IV SOLN
10.0000 mg/kg | Freq: Two times a day (BID) | INTRAVENOUS | Status: DC
  Administered 2021-07-31: 770 mg via INTRAVENOUS
  Filled 2021-07-30 (×2): qty 15.4

## 2021-07-30 NOTE — ED Notes (Signed)
Pt placed in mitts due to pulling at monitor equipment and IV's.

## 2021-07-30 NOTE — ED Provider Notes (Signed)
[facility] Provider Note    Event Date/Time   First MD Initiated Contact with Patient 07/30/21 1801     (approximate)  History   Chief Complaint: Altered Mental Status  HPI  Diane Patton is a 76 y.o. female with a past medical history of CHF, COPD, diabetes, gastric reflux, hypertension, hyperlipidemia, presents to the emergency department for altered mental status.  According to EMS patient is coming from home where family noted some blood in her ear and the patient seems quite confused.  Patient received Versed and Haldol prior to arrival by EMS due to combativeness.  Physical Exam   Triage Vital Signs: ED Triage Vitals  Enc Vitals Group     BP 07/30/21 1804 (!) 154/66     Pulse Rate 07/30/21 1804 (!) 109     Resp --      Temp 07/30/21 1804 99.2 F (37.3 C)     Temp Source 07/30/21 1804 Oral     SpO2 --      Weight 07/30/21 1806 170 lb (77.1 kg)     Height --      Head Circumference --      Peak Flow --      Pain Score --      Pain Loc --      Pain Edu? --      Excl. in [place]? --     Most recent vital signs: Vitals:   07/30/21 1804  BP: (!) 154/66  Pulse: (!) 109  Temp: 99.2 F (37.3 C)    General: Awake, no distress.  CV:  Good peripheral perfusion.  Regular rate and rhythm  Resp:  Normal effort.  Equal breath sounds bilaterally.  Abd:  No distention.  Soft, nontender.  No rebound or guarding.   ED Results / Procedures / Treatments   EKG  EKG viewed and interpreted by myself shows sinus tachycardia 104 bpm with a slightly widened QRS, normal axis, largely normal intervals with nonspecific ST changes.  RADIOLOGY  I have reviewed the CT images no obvious bleed or large finding on my interpretation. CT scan of the head is read as negative for acute abnormality. CT chest abdomen pelvis shows no obvious acute abnormality or cause for fever. Chest x-ray is negative  MEDICATIONS ORDERED IN ED: Medications  lactated ringers infusion  (has no administration in time range)  sodium chloride 0.9 % bolus 1,000 mL (has no administration in time range)  aztreonam (AZACTAM) 2 g in sodium chloride 0.9 % 100 mL IVPB (has no administration in time range)  metroNIDAZOLE (FLAGYL) IVPB 500 mg (has no administration in time range)  vancomycin (VANCOCIN) IVPB 1000 mg/200 mL premix (has no administration in time range)     IMPRESSION / MDM / ASSESSMENT AND PLAN / ED COURSE  I reviewed the triage vital signs and the nursing notes.  Patient's presentation is most consistent with acute presentation with potential threat to life or bodily function.  Patient presents to the emergency department for altered mental status.  EMS reports fever of 101.3.  On examination patient did have a small amount of dried blood to the outside of the left ear however normal-appearing tympanic membrane and external auditory canal.  Patient is confused but received Versed and Haldol prior to arrival.  Given the patient's reported fever confusion agitation altered mental state I have initiated sepsis protocols.  Patient is tachycardic 109 here, borderline temperature in the emergency department 99.2 but EMS recorded 101.3 orally.  We will check blood cultures labs urine chest x-ray COVID and continue to closely monitor.  We will start broad-spectrum antibiotics while awaiting further results.  Patient's work-up shows elevated lactic acid of 3.5, COVID and flu are negative.  Overall reassuring ABG.  Urinalysis shows no obvious infection.  Urine culture has been sent.  Chemistry shows mild renal insufficiency no significant abnormality.  CBC shows slight leukocytosis 11,300.  Patient remains confused/obtunded.  We will admit to the hospital service for ongoing management.  Discussed with the hospitalist regarding having IR obtain lumbar puncture if patient not improving.  Patient receiving broad-spectrum antibiotics and cultures have been sent.   CRITICAL CARE Performed  by: Harvest Dark   Total critical care time: 30 minutes  Critical care time was exclusive of separately billable procedures and treating other patients.  Critical care was necessary to treat or prevent imminent or life-threatening deterioration.  Critical care was time spent personally by me on the following activities: development of treatment plan with patient and/or surrogate as well as nursing, discussions with consultants, evaluation of patient's response to treatment, examination of patient, obtaining history from patient or surrogate, ordering and performing treatments and interventions, ordering and review of laboratory studies, ordering and review of radiographic studies, pulse oximetry and re-evaluation of patient's condition.   FINAL CLINICAL IMPRESSION(S) / ED DIAGNOSES   Sepsis Altered mental status   Note:  This document was prepared using Dragon voice recognition software and may include unintentional dictation errors.   Harvest Dark, MD 07/30/21 2308

## 2021-07-30 NOTE — Assessment & Plan Note (Signed)
Sliding scale insulin coverage.  Will hold home glimepiride, metformin and Ozempic

## 2021-07-30 NOTE — Sepsis Progress Note (Signed)
Sepsis protocol is being followed by [facility]. 

## 2021-07-30 NOTE — ED Notes (Signed)
RN unable to collect INR,PTT on pt due to pt being a difficult stick. Pt IV's will not pull back blood.

## 2021-07-30 NOTE — Assessment & Plan Note (Addendum)
Restart Toprol and Entresto.

## 2021-07-30 NOTE — ED Notes (Signed)
Per Resp, pt o2 on the ABG was over 100. Pt [place] can be decreased. RN moved pt to 4 liters/min via [place]

## 2021-07-30 NOTE — Assessment & Plan Note (Addendum)
Improved.  Secondary to sepsis.

## 2021-07-30 NOTE — ED Notes (Signed)
RN notified MD of lactic of 2.2

## 2021-07-30 NOTE — Assessment & Plan Note (Addendum)
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

## 2021-07-30 NOTE — ED Notes (Signed)
RN notified provider that [facility] was able to be drawn.

## 2021-07-30 NOTE — Consult Note (Incomplete)
NAME:  Diane Patton, MRN:  [id number], DOB:  15-Mar-1945, LOS: 0 ADMISSION DATE:  07/30/2021, CONSULTATION DATE:  07/30/2021 REFERRING MD:  Dr. Damita Dunnings, CHIEF COMPLAINT:  Altered Mental Status   History of Present Illness:  76 yo F presenting to [facility] ED from home via EMS due to altered mental status and bloody drainage from L ear. The patient is too altered to participate in interview, history obtained by Pati Gallo the patient's husband. She lives with her husband and son, baseline is independent of ADL's with use of a cane due to a remote ankle injury. She has been intermittently fatigued over the last 6 months. She has not "needed" her oxygen or CPAP for the last 2-3 years, per husband this was based off of how the patient felt. He confirmed she was in this state of health until Friday 07/28/21 when she began complaining of a headache and L ear pain. Today, Sunday 07/30/21, she appeared confused talking on a spoon as if it was a telephone. She also didn't recognize her son or husband. Husband denies fever/chills/ chest pain/ congestion or unusual shortness of breath. He denied any nausea/ diarrhea or vomiting- however when EMS arrived she did vomit. Per EMS report to ED staff and ED documentation, patient was confused and combative on arrival. EMS administered 5 mg of haldol and 2.5 mg of versed. Patient febrile at 101.3, repeating words inappropriately and vomiting with CBG of 221.  ED course: Upon arrival patient remains altered and somnolent. Per ED documentation, L ear did have a small amount of dried blood present, however examined by EDP who noted normal - appearing tympanic membrane and external auditory canal. Sepsis protocol initiated with IVF & antibiotics. Pan-scan unrevealing. Lab work notable for lactic acidosis & leukocytosis. TRH consulted to admit. While in ED, concern regarding mentation and airway protection. Patient increasingly febrile with current TMAX 103.9. Patient's RASS - 3, with  mumbling words but oxygenating and ventilating, ABG reassuring. CT head negative. Will treat fever aggressively and hold off on intubation at this time. However HIGH RISK FOR INTUBATION. Decision made after bedside assessment with TRH Attending to admit to SDU, PCCM consultation and monitoring closely. Medications given: acetaminophen, cefepime/ flagyl/ vancomycin, 1 L NS bolus Initial Vitals: 99.2, tachypneic- 30, tachycardic- 109, 154/66 & SpO2 84 % on 2 L  Significant labs: (Labs/ Imaging personally reviewed) I, Domingo Pulse Rust-[place], AGACNP-BC, personally viewed and interpreted this ECG. EKG Interpretation: Date: 07/30/21, EKG Time: 18:02, Rate: 104, Rhythm: ST with LBBB, QRS Axis: normal, Intervals: baseline LBBB, ST/T Wave abnormalities: non specific T wave inversions, Narrative Interpretation: ST with baseline LBBB and non specific T wave inversions Chemistry: Na+: 140, K+: 3.7, BUN/Cr.: 22/1.38, Serum CO2/ AG: 27/6 Hematology: WBC: 11.3, Hgb: 10,  BNP: 486.9, Lactic/ PCT: 2.2 > 3.5/ <0.10,  COVID-19 & Influenza A/B: negative ABG: 7.44/ 32/ 119/ 21.7 CXR 07/30/21: Low lung volumes without evidence of acute cardiopulmonary diesase CT chest/abdomen/pelvis wo contrast 07/30/21: No acute intrathoracic, abdominal or pelvic pathology. Colonic diverticulosis. No bowel obstruction. Aortic atherosclerosis CT head wo contrast 07/30/21: No acute intracranial abnormality. Generalized cerebral atrophy with chronic [place] matter small vessel changes  PCCM consulted for additional management and monitoring due to Acute encephalopathy and Sepsis with unknown source as a HIGH RISK FOR INTUBATION.  Pertinent  Medical History  Asthma HFrEF (echo 5/23: LVEF 35-40%, mild MR & TR) LBBB CKD Stage IV NICM COPD T2DM HTN HLD Iron deficiency Anemia OSA on CPAP Scoliosis Osteoarthritis Former smoker (quit  1984) Gastric Ulcer without perforation  Significant Hospital Events: Including procedures,  antibiotic start and stop dates in addition to other pertinent events   07/30/21: Admit to SDU with Sepsis d/t unknown source (suspicious for meningitis) and AMS with PCCM consult due to [place]  Interim History / Subjective:  Patient somnolent, RASS: - 3, unable to follow commands. She will rouse, make eye contact and mumble- no commands. Extremely febrile at 103.9. tylenol given. ABG reassuring.  Will monitor for now as patient is oxygenating and ventilating, very low threshold for mechanical intubation and ventilatory support. - husband updated on plan of care, all questions and concerns answered at this time.  Objective   Blood pressure (!) 146/47, pulse 62, temperature (!) 103.9 F (39.9 C), temperature source Rectal, resp. rate 16, height '5\' 6"'$  (1.676 m), weight 77.1 kg, last menstrual period 06/20/1967, SpO2 99 %.       No intake or output data in the 24 hours ending 07/30/21 2202 Filed Weights   07/30/21 1806  Weight: 77.1 kg    Examination: General: Adult female, acutely ill, lying in bed, diaphoretic, febrile, confused HEENT: MM pink/moist, anicteric, atraumatic, neck supple Neuro: RASS: -3, unable to follow commands, PERRL +3, MAE CV: s1s2 RRR, NSR with LBBB on monitor, no r/m/g Pulm: Regular, non labored on 2 L [place], breath sounds clear-BUL & diminished-BLL GI: soft, rounded, bs x 4 Skin: no rashes/lesions noted Extremities: warm/dry, pulses + 2 R/P, no edema noted  Resolved Hospital Problem list     Assessment & Plan:  Sepsis without septic shock due to unknown source Suspected Meningitis due to AMS and fever Possible Aspiration secondary to vomiting UA: +protein, CXR: negative, CT: negative  Initial interventions/workup included: 1 L of NS & Cefepime/ Vancomycin/ Metronidazole - will add additional 500 mL bolus to complete 20 mg/kg (HFrEF history with elevated BNP) - Supplemental oxygen as needed, to maintain SpO2 > 90% - f/u cultures, trend  lactic/ PCT - Daily CBC, monitor WBC/ fever curve - IV antibiotics per primary service: ampicillin, acyclovir, ceftriaxone, flagyl & vancomycin. Consider discontinuing Flagyl due to negative CT abdomen/pelvis. - IVF hydration as needed: lowered continuous IVF to 100 mL/h due to additional bolus - IR consulted by primary service in AM for evaluation of LP - consider ID consult - droplet precautions  Acute Hypoxic Respiratory Failure secondary to possible aspiration in the setting of sepsis, COPD, asthma & OSA ABG reassuring, patient only requiring 2 L [place], high risk for intubation based on neuro status - Supplemental O2 to maintain SpO2 > 90% - Intermittent chest x-ray & ABG PRN - Ensure adequate pulmonary hygiene  - F/u cultures, trend PCT - antibiotics as above - budesonide inhaler/***nebs BID, bronchodilators PRN  Acute encephalopathy suspect secondary to infectious process in the setting of sepsis d/t unknown etiology - Q 4 neuro checks - Minimize sedation, especially Benzos as tolerated - Emphasize sleep/wake cycle  CKD Stage IV  - Strict I/O's: alert provider if UOP < 0.5 mL/kg/hr - Daily BMP, replace electrolytes PRN - Avoid nephrotoxic agents as able, ensure adequate renal perfusion  Type 2 Diabetes Mellitus Hemoglobin A1C: pending - Monitor CBG Q 4 hours - SSI moderate dosing - target range while in ICU: 140-180 - follow ICU hyper/hypo-glycemia protocol  Best Practice (right click and "Reselect all SmartList Selections" daily)  Diet/type: {diet IPJA:25053} DVT prophylaxis: {anticoagulation (Optional):25687} GI prophylaxis: {ZJ:67341} Lines: {Central Venous Access:25771} Foley:  {Central Venous Access:25691} Code Status:  {Code Status:26939} Last  date of multidisciplinary goals of care discussion [***]  Labs   CBC: Recent Labs  Lab 07/30/21 1803  WBC 11.3*  NEUTROABS 9.3*  HGB 10.0*  HCT 31.5*  MCV 97.2  PLT 765    Basic Metabolic Panel: Recent Labs   Lab 07/30/21 1803  NA 140  K 3.7  CL 107  CO2 27  GLUCOSE 195*  BUN 22  CREATININE 1.38*  CALCIUM 9.3   GFR: Estimated Creatinine Clearance: 35.8 mL/min (A) (by C-G formula based on SCr of 1.38 mg/dL (H)). Recent Labs  Lab 07/30/21 1803 07/30/21 2100  WBC 11.3*  --   LATICACIDVEN 2.2* 3.5*    Liver Function Tests: Recent Labs  Lab 07/30/21 1803  AST 20  ALT 13  ALKPHOS 39  BILITOT 0.9  PROT 6.4*  ALBUMIN 3.8   No results for input(s): "LIPASE", "AMYLASE" in the last 168 hours. No results for input(s): "AMMONIA" in the last 168 hours.  ABG No results found for: "PHART", "PCO2ART", "PO2ART", "HCO3", "TCO2", "ACIDBASEDEF", "O2SAT"   Coagulation Profile: Recent Labs  Lab 07/30/21 2100  INR 1.1    Cardiac Enzymes: No results for input(s): "CKTOTAL", "CKMB", "CKMBINDEX", "TROPONINI" in the last 168 hours.  HbA1C: Hemoglobin A1C  Date/Time Value Ref Range Status  09/03/2012 10:30 AM 7.5 (H) 4.2 - 6.3 % Final    Comment:    The American Diabetes Association recommends that a primary goal of therapy should be <7% and that physicians should reevaluate the treatment regimen in patients with HbA1c values consistently >8%.    Hgb A1c MFr Bld  Date/Time Value Ref Range Status  10/30/2018 08:52 AM 6.0 (H) 4.8 - 5.6 % Final    Comment:    (NOTE)         Prediabetes: 5.7 - 6.4         Diabetes: >6.4         Glycemic control for adults with diabetes: <7.0     CBG: No results for input(s): "GLUCAP" in the last 168 hours.  Review of Systems:   UTA- patient altered and unable to participate in interview  Past Medical History:  She,  has a past medical history of Asthma, Cardiomyopathy, nonischemic ([place]), CHF (congestive heart failure) ([place]), COPD (chronic obstructive pulmonary disease) ([place]), Diabetes mellitus, type 2 ([place]), Gastric ulcer without hemorrhage or perforation, GERD (gastroesophageal reflux disease), HTN (hypertension), Hyperlipidemia, IDA (iron  deficiency anemia) (04/05/2020), Obstructive sleep apnea on CPAP, Osteoarthritis, and Scoliosis.   Surgical History:   Past Surgical History:  Procedure Laterality Date  . ABDOMINAL HYSTERECTOMY    . ankle (other)    . APPENDECTOMY    . BREAST BIOPSY Right 1999   neg  . BREAST CYST ASPIRATION     neg  . BREAST SURGERY    . CARDIAC CATHETERIZATION    . CLAVICLE SURGERY Right    Fx  . COLONOSCOPY WITH PROPOFOL N/A 07/10/2016   Procedure: COLONOSCOPY WITH PROPOFOL;  Surgeon: Lollie Sails, MD;  Location: [facility] ENDOSCOPY;  Service: Endoscopy;  Laterality: N/A;  . ear (otheR)    . ESOPHAGOGASTRODUODENOSCOPY N/A 05/11/2019   Procedure: ESOPHAGOGASTRODUODENOSCOPY (EGD);  Surgeon: Toledo, Benay Pike, MD;  Location: [facility] ENDOSCOPY;  Service: Gastroenterology;  Laterality: N/A;  . ESOPHAGOGASTRODUODENOSCOPY (EGD) WITH PROPOFOL N/A 07/10/2016   Procedure: ESOPHAGOGASTRODUODENOSCOPY (EGD) WITH PROPOFOL;  Surgeon: Lollie Sails, MD;  Location[facility] ENDOSCOPY;  Service: Endoscopy;  Laterality: N/A;  . ESOPHAGOGASTRODUODENOSCOPY (EGD) WITH PROPOFOL N/A 09/17/2017   Procedure: ESOPHAGOGASTRODUODENOSCOPY (EGD) WITH  PROPOFOL;  Surgeon: Lollie Sails, MD;  Location[facility] ENDOSCOPY;  Service: Endoscopy;  Laterality: N/A;  . feet (other)    . HERNIA REPAIR    . sinus (other)    . stomach    . UPPER ESOPHAGEAL ENDOSCOPIC ULTRASOUND (EUS) N/A 11/22/2016   Procedure: UPPER ESOPHAGEAL ENDOSCOPIC ULTRASOUND (EUS);  Surgeon: Reita Cliche, MD;  Location: [facility] ENDOSCOPY;  Service: Gastroenterology;  Laterality: N/A;     Social History:   reports that she quit smoking about 39 years ago. Her smoking use included cigarettes. She has a 15.00 pack-year smoking history. She has never used smokeless tobacco. She reports that she does not drink alcohol and does not use drugs.   Family History:  Her family history includes Alzheimer's disease in an other family member; Breast cancer in her maternal  aunt; Cancer in her brother, daughter, and maternal aunt; Dementia in her father; Heart attack in her mother; Hypertension in her mother.   Allergies Allergies  Allergen Reactions  . Amoxicillin Other (See Comments)    GI distress  . Aspirin Diarrhea and Nausea And Vomiting  . Celebrex [Celecoxib] Nausea Only  . Latex Itching and Swelling  . Penicillins Other (See Comments)    GI distress  . Salicylates   . Allegra [Fexofenadine] Cough     Home Medications  Prior to Admission medications   Medication Sig Start Date End Date Taking? Authorizing Provider  albuterol (PROVENTIL HFA;VENTOLIN HFA) 108 (90 BASE) MCG/ACT inhaler Inhale 2 puffs into the lungs every 6 (six) hours as needed for wheezing or shortness of breath.    [provider]  aspirin 81 MG tablet Take 81 mg by mouth daily. Patient not taking: Reported on 11/29/2020    [provider]  BIDIL 20-37.5 MG tablet TAKE 1 TABLET BY MOUTH THREE TIMES DAILY Patient taking differently: Take 1 tablet by mouth 3 (three) times daily. 05/20/17   Alisa Graff, FNP  Biotin 1000 MCG tablet Take 1,000 mcg by mouth daily.     [provider]  calcium carbonate (OS-CAL) 600 MG TABS tablet Take 600 mg by mouth 2 (two) times daily with a meal.     [provider]  cetirizine (ZYRTEC) 10 MG tablet Take 10 mg by mouth daily.    [provider]  cholecalciferol (VITAMIN D) 400 units TABS tablet Take 2,000 Units by mouth daily.     [provider]  [facility] Liver Oil w/Vit A & D CAPS Take 1 capsule by mouth daily.    [provider]  ENTRESTO 97-103 MG TAKE 1 TABLET BY MOUTH TWICE DAILY Patient not taking: Reported on 08/16/2020 09/18/17   Alisa Graff, FNP  fluticasone [facility]) 50 MCG/ACT nasal spray Place into the nose. 01/30/19 08/16/20  [provider]  Fluticasone-Salmeterol (ADVAIR) 500-50 MCG/DOSE AEPB Inhale 1 puff into the lungs 2 (two) times daily.    [provider]  furosemide (LASIX) 40 MG tablet Take 40 mg by mouth daily.    [provider]  glimepiride (AMARYL) 2 MG tablet Take 2 mg by mouth daily with breakfast.    [provider]  Iron-Vitamin C (VITRON-C) 65-125 MG TABS Take 1 tablet by mouth daily. 10/08/19   Earlie Server, MD  levocetirizine (XYZAL) 5 MG tablet Take 5 mg by mouth every evening.    [provider]  meloxicam (MOBIC) 7.5 MG tablet Take 7.5 mg by mouth 2 (two) times daily as needed for pain. Takes one  every other day    [provider]  metFORMIN (GLUCOPHAGE) 500 MG tablet Take 1,000 mg by mouth 2 (two) times daily with a meal.    [provider]  metoprolol (TOPROL-XL) 200 MG 24 hr tablet TAKE 1 TABLET BY MOUTH ONCE DAILY. TAKE WITH OR IMMEDIATELY FOLLOWING A MEAL. Patient not taking: Reported on 11/29/2020 05/20/17   Darylene Price A, FNP  montelukast (SINGULAIR) 10 MG tablet Take 10 mg by mouth at bedtime.    [provider]  Multiple Vitamin (MULTIVITAMIN) tablet Take 1 tablet by mouth daily.     [provider]  Omega 3-6-9 Fatty Acids (TRIPLE OMEGA-3-6-9) CAPS Take 1 capsule by mouth daily.    [provider]  omeprazole (PRILOSEC) 20 MG capsule Take 20 mg by mouth 2 (two) times daily before a meal.    [provider]  ondansetron (ZOFRAN-ODT) 4 MG disintegrating tablet Take 4 mg every 8 (eight) hours as needed by mouth for nausea or vomiting.    [provider]  pantoprazole (PROTONIX) 40 MG tablet Take 40 mg by mouth 2 (two) times daily.    [provider]  potassium chloride (K-DUR) 10 MEQ tablet TAKE 1 TABLET BY MOUTH ONCE DAILY Patient not taking: Reported on 11/29/2020 09/18/17   Alisa Graff, FNP  predniSONE (DELTASONE) 10 MG tablet Take by mouth. Patient not taking: Reported on 08/16/2020 06/01/19   [provider]  rosuvastatin (CRESTOR) 20 MG tablet Take 20 mg by mouth daily.    [provider]   Semaglutide,0.25 or 0.'5MG'$ /DOS, (OZEMPIC, 0.25 OR 0.5 MG/DOSE,) 2 MG/1.5ML SOPN Inject 0.375 mLs into the skin once a week. Patient not taking: Reported on 04/08/2020 10/22/18   [provider]  Tiotropium Bromide Monohydrate 1.25 MCG/ACT AERS Inhale 2 puffs into the lungs daily.    [provider]  traMADol-acetaminophen (ULTRACET) 37.5-325 MG tablet Take 1 tablet by mouth every 6 (six) hours as needed. Takes twice a day    [provider]  traZODone (DESYREL) 100 MG tablet Take 100 mg by mouth at bedtime.    [provider]  vitamin B-12 (CYANOCOBALAMIN) 500 MCG tablet Take 500 mcg daily by mouth.    [provider]  vitamin C (ASCORBIC ACID) 500 MG tablet Take 500 mg by mouth daily.     [provider]  vitamin E 400 UNIT capsule Take 400 Units by mouth daily.    [provider]     Critical care time: ***       Domingo Pulse Rust-[place], AGACNP-BC Acute Care Nurse Practitioner [facility]   [phone] / [phone] Please see Amion for pager details.

## 2021-07-30 NOTE — ED Triage Notes (Addendum)
Per EMS they were called to pt house due blood coming out of pt ears than when they arrived pt is having altered mental status. Pt was combative at the house. Last known well was at 2000 07/29/21. EMS gave '5mg'$  of haldol and 2.5 of versed. Pt is febrile at 101.3 with repeating of words and vomiting. Per EMS, BGL 221

## 2021-07-30 NOTE — Assessment & Plan Note (Addendum)
Continue nebulizers and inhalers

## 2021-07-30 NOTE — Assessment & Plan Note (Addendum)
Last hemoglobin 7.3.  Transfuse 1 unit of packed red blood cells today and hemoglobin came up from 7.3 to 9.5

## 2021-07-30 NOTE — Consult Note (Signed)
PHARMACY -  BRIEF ANTIBIOTIC NOTE   Pharmacy has received consult(s) for Vancomycin & Aztreonam from an ED provider.  The patient's profile has been reviewed for ht/wt/allergies/indication/available labs.    One time order(s) placed for: Vancomycin 1g IV x1 in ED   Modified to>> Cefepime 2g IV x1 in ED (previously tolerated Cefepime) Pt also receiving Metronidazole '500mg'$  IV x1 in ED  Further antibiotics/pharmacy consults should be ordered by admitting physician if indicated.                       Thank you, Lorna Dibble 07/30/2021  6:30 PM

## 2021-07-30 NOTE — H&P (Signed)
History and Physical    Patient: Diane Patton TTS:[id number] DOB: December 13, 1945 DOA: 07/30/2021 DOS: the patient was seen and examined on 07/30/2021 PCP: Tracie Harrier, MD  Patient coming from: Home  Chief Complaint:  Chief Complaint  Patient presents with   Altered Mental Status    HPI: Diane Patton is a 76 y.o. 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  EMS was called to the home where they found her temperature to be 101.3.  She was very confused and repeating words then started vomiting.  Due to agitation in route she was administered Versed and Haldol.  By arrival she was mostly somnolent, due to medications administered. ED course and data review: Temp 99.2, pulse 109, respirations 30-40 with O2 sat as low as 84% requiring 2 L O2 to maintain sats in the high 90s.  BP 154/66.  Blood work with WBC 11,300 and lactic acid 2.2.  Hemoglobin 10 which is her baseline.  Creatinine 1.38 at baseline.  Urinalysis unremarkable and COVID and flu negative.  EKG, personally viewed and interpreted with sinus tachycardia at 104 and left bundle branch block.  Chest x-ray with no acute cardiopulmonary disease. CT chest abdomen pelvis showed no acute findings as follows: IMPRESSION: 1. No acute intrathoracic, abdominal, or pelvic pathology. 2. Colonic diverticulosis. No bowel obstruction. 3. Aortic Atherosclerosis (ICD10-I70.0).  Patient started on broad-spectrum antibiotics for sepsis of unknown source and hospitalist consulted for admission.   Review of Systems: As mentioned in the history of present illness. All other systems reviewed and are negative.  Past Medical History:  Diagnosis Date    Asthma    Cardiomyopathy, nonischemic (HCC)    CHF (congestive heart failure) (HCC)    COPD (chronic obstructive pulmonary disease) (HCC)    Diabetes mellitus, type 2 (HCC)    Gastric ulcer without hemorrhage or perforation    GERD (gastroesophageal reflux disease)    HTN (hypertension)    Hyperlipidemia    IDA (iron deficiency anemia) 04/05/2020   Obstructive sleep apnea on CPAP    Osteoarthritis    osteo of both knees   Scoliosis    Past Surgical History:  Procedure Laterality Date   ABDOMINAL HYSTERECTOMY     ankle (other)     APPENDECTOMY     BREAST BIOPSY Right 1999   neg   BREAST CYST ASPIRATION     neg   BREAST SURGERY     CARDIAC CATHETERIZATION     CLAVICLE SURGERY Right    Fx   COLONOSCOPY WITH PROPOFOL N/A 07/10/2016   Procedure: COLONOSCOPY WITH PROPOFOL;  Surgeon: Lollie Sails, MD;  Location: [facility] ENDOSCOPY;  Service: Endoscopy;  Laterality: N/A;   ear (otheR)     ESOPHAGOGASTRODUODENOSCOPY N/A 05/11/2019   Procedure: ESOPHAGOGASTRODUODENOSCOPY (EGD);  Surgeon: Toledo, Benay Pike, MD;  Location: [facility] ENDOSCOPY;  Service: Gastroenterology;  Laterality: N/A;   ESOPHAGOGASTRODUODENOSCOPY (EGD) WITH PROPOFOL N/A 07/10/2016   Procedure: ESOPHAGOGASTRODUODENOSCOPY (EGD) WITH PROPOFOL;  Surgeon: Lollie Sails, MD;  Location: [facility] ENDOSCOPY;  Service: Endoscopy;  Laterality: N/A;   ESOPHAGOGASTRODUODENOSCOPY (EGD) WITH PROPOFOL N/A 09/17/2017   Procedure: ESOPHAGOGASTRODUODENOSCOPY (EGD) WITH PROPOFOL;  Surgeon: Lollie Sails, MD;  Location: [facility] ENDOSCOPY;  Service: Endoscopy;  Laterality: N/A;  feet (other)     HERNIA REPAIR     sinus (other)     stomach     UPPER ESOPHAGEAL ENDOSCOPIC ULTRASOUND (EUS) N/A 11/22/2016   Procedure: UPPER ESOPHAGEAL ENDOSCOPIC ULTRASOUND (EUS);  Surgeon: Reita Cliche, MD;  Location: [facility] ENDOSCOPY;  Service: Gastroenterology;  Laterality: N/A;   Social History:  reports that she quit smoking about 39 years ago. Her  smoking use included cigarettes. She has a 15.00 pack-year smoking history. She has never used smokeless tobacco. She reports that she does not drink alcohol and does not use drugs.  Allergies  Allergen Reactions   Amoxicillin Other (See Comments)    GI distress   Aspirin Diarrhea and Nausea And Vomiting   Celebrex [Celecoxib] Nausea Only   Latex Itching and Swelling   Penicillins Other (See Comments)    GI distress   Allegra [Fexofenadine] Cough    Family History  Problem Relation Age of Onset   Alzheimer's disease Other    Heart attack Mother    Hypertension Mother    Cancer Brother        lung   Cancer Maternal Aunt        mouth and breast   Breast cancer Maternal Aunt    Cancer Daughter        throat   Dementia Father     Prior to Admission medications   Medication Sig Start Date End Date Taking? Authorizing Provider  albuterol (PROVENTIL HFA;VENTOLIN HFA) 108 (90 BASE) MCG/ACT inhaler Inhale 2 puffs into the lungs every 6 (six) hours as needed for wheezing or shortness of breath.    [provider]  aspirin 81 MG tablet Take 81 mg by mouth daily. Patient not taking: Reported on 11/29/2020    [provider]  BIDIL 20-37.5 MG tablet TAKE 1 TABLET BY MOUTH THREE TIMES DAILY Patient taking differently: Take 1 tablet by mouth 3 (three) times daily. 05/20/17   Alisa Graff, FNP  Biotin 1000 MCG tablet Take 1,000 mcg by mouth daily.     [provider]  calcium carbonate (OS-CAL) 600 MG TABS tablet Take 600 mg by mouth 2 (two) times daily with a meal.     [provider]  cetirizine (ZYRTEC) 10 MG tablet Take 10 mg by mouth daily.    [provider]  cholecalciferol (VITAMIN D) 400 units TABS tablet Take 2,000 Units by mouth daily.     [provider]  [facility] Liver Oil w/Vit A & D CAPS Take 1 capsule by mouth daily.    [provider]  ENTRESTO 97-103 MG TAKE 1 TABLET BY MOUTH TWICE DAILY Patient not taking:  Reported on 08/16/2020 09/18/17   Alisa Graff, FNP  fluticasone [facility]) 50 MCG/ACT nasal spray Place into the nose. 01/30/19 08/16/20  [provider]  Fluticasone-Salmeterol (ADVAIR) 500-50 MCG/DOSE AEPB Inhale 1 puff into the lungs 2 (two) times daily.    [provider]  furosemide (LASIX) 40 MG tablet Take 40 mg by mouth daily.    [provider]  glimepiride (AMARYL) 2 MG tablet Take 2 mg by mouth daily with breakfast.    [provider]  Iron-Vitamin C (VITRON-C) 65-125 MG TABS Take 1 tablet by mouth daily. 10/08/19   Earlie Server, MD  levocetirizine (XYZAL) 5 MG tablet Take 5 mg by mouth every evening.    [provider]  meloxicam (MOBIC) 7.5 MG tablet Take 7.5 mg by mouth 2 (two) times daily as needed  for pain. Takes one every other day    [provider]  metFORMIN (GLUCOPHAGE) 500 MG tablet Take 1,000 mg by mouth 2 (two) times daily with a meal.    [provider]  metoprolol (TOPROL-XL) 200 MG 24 hr tablet TAKE 1 TABLET BY MOUTH ONCE DAILY. TAKE WITH OR IMMEDIATELY FOLLOWING A MEAL. Patient not taking: Reported on 11/29/2020 05/20/17   Darylene Price A, FNP  montelukast (SINGULAIR) 10 MG tablet Take 10 mg by mouth at bedtime.    [provider]  Multiple Vitamin (MULTIVITAMIN) tablet Take 1 tablet by mouth daily.     [provider]  Omega 3-6-9 Fatty Acids (TRIPLE OMEGA-3-6-9) CAPS Take 1 capsule by mouth daily.    [provider]  omeprazole (PRILOSEC) 20 MG capsule Take 20 mg by mouth 2 (two) times daily before a meal.    [provider]  ondansetron (ZOFRAN-ODT) 4 MG disintegrating tablet Take 4 mg every 8 (eight) hours as needed by mouth for nausea or vomiting.    [provider]  pantoprazole (PROTONIX) 40 MG tablet Take 40 mg by mouth 2 (two) times daily.    [provider]  potassium chloride (K-DUR) 10 MEQ tablet TAKE 1 TABLET BY MOUTH ONCE DAILY Patient not taking:  Reported on 11/29/2020 09/18/17   Alisa Graff, FNP  predniSONE (DELTASONE) 10 MG tablet Take by mouth. Patient not taking: Reported on 08/16/2020 06/01/19   [provider]  rosuvastatin (CRESTOR) 20 MG tablet Take 20 mg by mouth daily.    [provider]  Semaglutide,0.25 or 0.'5MG'$ /DOS, (OZEMPIC, 0.25 OR 0.5 MG/DOSE,) 2 MG/1.5ML SOPN Inject 0.375 mLs into the skin once a week. Patient not taking: Reported on 04/08/2020 10/22/18   [provider]  Tiotropium Bromide Monohydrate 1.25 MCG/ACT AERS Inhale 2 puffs into the lungs daily.    [provider]  traMADol-acetaminophen (ULTRACET) 37.5-325 MG tablet Take 1 tablet by mouth every 6 (six) hours as needed. Takes twice a day    [provider]  traZODone (DESYREL) 100 MG tablet Take 100 mg by mouth at bedtime.    [provider]  vitamin B-12 (CYANOCOBALAMIN) 500 MCG tablet Take 500 mcg daily by mouth.    [provider]  vitamin C (ASCORBIC ACID) 500 MG tablet Take 500 mg by mouth daily.     [provider]  vitamin E 400 UNIT capsule Take 400 Units by mouth daily.    [provider]    Physical Exam: Vitals:   07/30/21 1900 07/30/21 1915 07/30/21 1930 07/30/21 2000  BP: (!) 173/135 (!) 116/94 (!) 133/54 (!) 152/76  Pulse: 81 72 64 75  Resp: (!) 30 (!) 40 (!) 32 (!) 32  Temp:      TempSrc:      SpO2: 92%  96% 96%  Weight:       Physical Exam Vitals and nursing note reviewed.  Constitutional:      General: She is not in acute distress.    Comments: Very lethargic, difficult to arouse and will readily go back to sleep  HENT:     Head: Normocephalic and atraumatic.  Cardiovascular:     Rate and Rhythm: Normal rate and regular rhythm.     Heart sounds: Normal heart sounds.  Pulmonary:     Effort: Tachypnea present.     Breath sounds: Normal breath sounds.  Abdominal:     Palpations: Abdomen is soft.     Tenderness: There is no  abdominal tenderness.   Neurological:     Mental Status: She is lethargic.     Labs on Admission: I have personally reviewed following labs and imaging studies  CBC: Recent Labs  Lab 07/30/21 1803  WBC 11.3*  NEUTROABS 9.3*  HGB 10.0*  HCT 31.5*  MCV 97.2  PLT 301   Basic Metabolic Panel: Recent Labs  Lab 07/30/21 1803  NA 140  K 3.7  CL 107  CO2 27  GLUCOSE 195*  BUN 22  CREATININE 1.38*  CALCIUM 9.3   GFR: CrCl cannot be calculated (Unknown ideal weight.). Liver Function Tests: Recent Labs  Lab 07/30/21 1803  AST 20  ALT 13  ALKPHOS 39  BILITOT 0.9  PROT 6.4*  ALBUMIN 3.8   No results for input(s): "LIPASE", "AMYLASE" in the last 168 hours. No results for input(s): "AMMONIA" in the last 168 hours. Coagulation Profile: No results for input(s): "INR", "PROTIME" in the last 168 hours. Cardiac Enzymes: No results for input(s): "CKTOTAL", "CKMB", "CKMBINDEX", "TROPONINI" in the last 168 hours. BNP (last 3 results) No results for input(s): "PROBNP" in the last 8760 hours. HbA1C: No results for input(s): "HGBA1C" in the last 72 hours. CBG: No results for input(s): "GLUCAP" in the last 168 hours. Lipid Profile: No results for input(s): "CHOL", "HDL", "LDLCALC", "TRIG", "CHOLHDL", "LDLDIRECT" in the last 72 hours. Thyroid Function Tests: No results for input(s): "TSH", "T4TOTAL", "FREET4", "T3FREE", "THYROIDAB" in the last 72 hours. Anemia Panel: No results for input(s): "VITAMINB12", "FOLATE", "FERRITIN", "TIBC", "IRON", "RETICCTPCT" in the last 72 hours. Urine analysis:    Component Value Date/Time   COLORURINE YELLOW (A) 07/30/2021 1820   APPEARANCEUR CLEAR (A) 07/30/2021 1820   APPEARANCEUR Clear 08/12/2013 1348   LABSPEC 1.019 07/30/2021 1820   LABSPEC 1.021 08/12/2013 1348   PHURINE 5.0 07/30/2021 1820   GLUCOSEU NEGATIVE 07/30/2021 1820   GLUCOSEU Negative 08/12/2013 1348   HGBUR NEGATIVE 07/30/2021 1820   BILIRUBINUR NEGATIVE 07/30/2021 1820   BILIRUBINUR  Negative 08/12/2013 [place] 07/30/2021 1820   PROTEINUR 100 (A) 07/30/2021 1820   NITRITE NEGATIVE 07/30/2021 1820   LEUKOCYTESUR NEGATIVE 07/30/2021 1820   LEUKOCYTESUR Negative 08/12/2013 1348    Radiological Exams on Admission: DG Chest Port 1 View  Result Date: 07/30/2021 CLINICAL DATA:  Altered mental status. EXAM: PORTABLE CHEST 1 VIEW COMPARISON:  October 30, 2018 FINDINGS: The heart size and mediastinal contours are within normal limits. There is marked severity calcification of the aortic arch. Decreased lung volumes are seen with subsequent crowding of the bronchovascular lung markings. Mild, diffuse, chronic appearing increased lung markings are also noted. There is no evidence of focal consolidation, pleural effusion or pneumothorax. The visualized skeletal structures are unremarkable. IMPRESSION: Low lung volumes without evidence of acute or active cardiopulmonary disease. Electronically Signed   By: Virgina Norfolk M.D.   On: 07/30/2021 18:25     Data Reviewed: Relevant notes from primary care and specialist visits, past discharge summaries as available in EHR, including Care Everywhere. Prior diagnostic testing as pertinent to current admission diagnoses Updated medications and problem lists for reconciliation ED course, including vitals, labs, imaging, treatment and response to treatment Triage notes, nursing and pharmacy notes and ED provider's notes Notable results as noted in HPI   Assessment and Plan: * Sepsis ([place]) Source unknown, possible meningitis Continue Vanco cefepime and Flagyl and add acyclovir and ampicillin Continue sepsis fluids with monitoring for fluid overload in view of history of systolic heart failure, EF 40% Follow-up  procalcitonin and trend lactic acid Follow blood cultures IR consult for LP in the a.m. given unknown source (will keep NPO. SCd for DVT prophylaxis for am procedure) Can consider ID consult Has history of  hospitalization in 2020 for sepsis of unknown source suspected viral  Acute metabolic encephalopathy Secondary to sepsis Patient without meningeal signs but no source identified Neurologic checks with fall and aspiration precautions IR consult for LP in the a.m. given unknown source ICU consulted due to low GCS score to evaluate for need for intubation for airway protection ABG was ordered Close monitoring in stepdown  Acute respiratory failure with hypoxia ([place]) Patient was tachypneic to the 30s to 40s with O2 sat down to 84% on room air requiring 2 L to maintain sats in the high 90s Supplemental oxygen and wean as tolerated We will keep n.p.o. Aspiration precautions  Chronic systolic heart failure (HCC) Ischemic cardiomyopathy Continue Lasix, BiDil, metoprolol Monitor for overload in view of sepsis fluid resuscitation Patient follows with cardiology, last seen in May.  Most recent EF 40% Daily weights with intake and output monitoring  Stage 3b chronic kidney disease ([place]) Renal function at baseline with creatinine 1.53.  Patient follows with nephrology.  Anemia in chronic kidney disease Hemoglobin stable at 10  Chronic obstructive pulmonary disease ([place]) Not acutely exacerbated Continue Ativan and Spiriva.  DuoNebs as needed  Diabetes mellitus, type 2 (HCC) Sliding scale insulin coverage.  Will hold home glimepiride, metformin and Ozempic  Essential hypertension Blood pressure 152/96 Continue home antihypertensives        DVT prophylaxis: SCD  Consults: none  Advance Care Planning:   Code Status: Prior   Family Communication: none  Disposition Plan: Back to previous home environment  Severity of Illness: The appropriate patient status for this patient is INPATIENT. Inpatient status is judged to be reasonable and necessary in order to provide the required intensity of service to ensure the patient's safety. The patient's presenting symptoms, physical exam  findings, and initial radiographic and laboratory data in the context of their chronic comorbidities is felt to place them at high risk for further clinical deterioration. Furthermore, it is not anticipated that the patient will be medically stable for discharge from the hospital within 2 midnights of admission.   * I certify that at the point of admission it is my clinical judgment that the patient will require inpatient hospital care spanning beyond 2 midnights from the point of admission due to high intensity of service, high risk for further deterioration and high frequency of surveillance required.*  Author: Athena Masse, MD 07/30/2021 8:38 PM  For on call review [email or web address].

## 2021-07-30 NOTE — ED Notes (Signed)
RN attempted to get first set of BC on pt. RN attempted twice. RN notified provider and another RN for attempt.

## 2021-07-30 NOTE — Assessment & Plan Note (Addendum)
Last creatinine 1.09.  With GFR 52.

## 2021-07-30 NOTE — Assessment & Plan Note (Addendum)
Patient was tachypneic to the 30s to 40s with O2 sat down to 84% on room air. Patient now breathing comfortably on room air.

## 2021-07-30 NOTE — Consult Note (Signed)
CODE SEPSIS - PHARMACY COMMUNICATION  **Broad Spectrum Antibiotics should be administered within 1 hour of Sepsis diagnosis**  Time Code Sepsis Called/Page Received: 1830  Antibiotics Ordered: 1845  Time of 1st antibiotic administration: 1844  Additional action taken by pharmacy: N/A  If necessary, Name of Provider/Nurse Contacted: N/A  Lorna Dibble ,PharmD Clinical Pharmacist  07/30/2021  7:32 PM

## 2021-07-30 NOTE — Assessment & Plan Note (Addendum)
Restart Toprol.  Dose of IV Lasix today with blood transfusion.  We will restart low-dose Entresto.

## 2021-07-30 NOTE — Consult Note (Signed)
NAME:  Diane Patton, MRN:  [id number], DOB:  [birth year], LOS: 0 ADMISSION DATE:  07/30/2021, CONSULTATION DATE:  07/30/2021 REFERRING MD:  Dr. Damita Dunnings, CHIEF COMPLAINT:  Altered Mental Status   History of Present Illness:  76 yo F presenting to [facility] ED from home via EMS due to altered mental status and bloody drainage from L ear. The patient is too altered to participate in interview, history obtained by Pati Gallo the patient's husband. She lives with her husband and son, baseline is independent of ADL's with use of a cane due to a remote ankle injury. She has been intermittently fatigued over the last 6 months. She has not "needed" her oxygen or CPAP for the last 2-3 years, per husband this was based off of how the patient felt. He confirmed she was in this state of health until Friday 07/28/21 when she began complaining of a headache and L ear pain. Today, Sunday 07/30/21, she appeared confused talking on a spoon as if it was a telephone. She also didn't recognize her son or husband. Husband denies fever/chills/ chest pain/ congestion or unusual shortness of breath. He denied any nausea/ diarrhea or vomiting- however when EMS arrived she did vomit. Per EMS report to ED staff and ED documentation, patient was confused and combative on arrival. EMS administered 5 mg of haldol and 2.5 mg of versed. Patient febrile at 101.3, repeating words inappropriately and vomiting with CBG of 221.  ED course: Upon arrival patient remains altered and somnolent. Per ED documentation, L ear did have a small amount of dried blood present, however examined by EDP who noted normal - appearing tympanic membrane and external auditory canal. Sepsis protocol initiated with IVF & antibiotics. Pan-scan unrevealing. Lab work notable for lactic acidosis & leukocytosis. TRH consulted to admit. While in ED, concern regarding mentation and airway protection. Patient increasingly febrile with current TMAX 103.9. Patient's RASS - 3, with  mumbling words but oxygenating and ventilating, ABG reassuring. CT head negative. Will treat fever aggressively and hold off on intubation at this time. However HIGH RISK FOR INTUBATION. Decision made after bedside assessment with TRH Attending to admit to SDU, PCCM consultation and monitoring closely. Medications given: acetaminophen, cefepime/ flagyl/ vancomycin, 1 L NS bolus Initial Vitals: 99.2, tachypneic- 30, tachycardic- 109, 154/66 & SpO2 84 % on 2 L [place] Significant labs: (Labs/ Imaging personally reviewed) I, Domingo Pulse Rust[place], AGACNP-BC, personally viewed and interpreted this ECG. EKG Interpretation: Date: 07/30/21, EKG Time: 18:02, Rate: 104, Rhythm: ST with LBBB, QRS Axis: normal, Intervals: baseline LBBB, ST/T Wave abnormalities: non specific T wave inversions, Narrative Interpretation: ST with baseline LBBB and non specific T wave inversions Chemistry: Na+: 140, K+: 3.7, BUN/Cr.: 22/1.38, Serum CO2/ AG: 27/6 Hematology: WBC: 11.3, Hgb: 10,  BNP: 486.9, Lactic/ PCT: 2.2 > 3.5/ <0.10,  COVID-19 & Influenza A/B: negative ABG: 7.44/ 32/ 119/ 21.7 CXR 07/30/21: Low lung volumes without evidence of acute cardiopulmonary diesase CT chest/abdomen/pelvis wo contrast 07/30/21: No acute intrathoracic, abdominal or pelvic pathology. Colonic diverticulosis. No bowel obstruction. Aortic atherosclerosis CT head wo contrast 07/30/21: No acute intracranial abnormality. Generalized cerebral atrophy with chronic Suder matter small vessel changes  PCCM consulted for additional management and monitoring due to Acute encephalopathy and Sepsis with unknown source as a HIGH RISK FOR INTUBATION.  Pertinent  Medical History  Asthma HFrEF (echo 5/23: LVEF 35-40%, mild MR & TR) LBBB CKD Stage IV NICM COPD T2DM HTN HLD Iron deficiency Anemia OSA on CPAP Scoliosis Osteoarthritis Former smoker (quit  1984) Gastric Ulcer without perforation  Significant Hospital Events: Including procedures,  antibiotic start and stop dates in addition to other pertinent events   07/30/21: Admit to SDU with Sepsis d/t unknown source (suspicious for meningitis) and AMS with PCCM consult due to [place]  Interim History / Subjective:  Patient somnolent, RASS: - 3, unable to follow commands. She will rouse, make eye contact and mumble- no commands. Extremely febrile at 103.9. tylenol given. ABG reassuring.  Will monitor for now as patient is oxygenating and ventilating, very low threshold for mechanical intubation and ventilatory support. - husband updated on plan of care, all questions and concerns answered at this time.  Objective   Blood pressure (!) 146/47, pulse 62, temperature (!) 103.9 F (39.9 C), temperature source Rectal, resp. rate 16, height '5\' 6"'$  (1.676 m), weight 77.1 kg, last menstrual period 06/20/1967, SpO2 99 %.       No intake or output data in the 24 hours ending 07/30/21 2202 Filed Weights   07/30/21 1806  Weight: 77.1 kg    Examination: General: Adult female, acutely ill, lying in bed, diaphoretic, febrile, confused HEENT: MM pink/moist, anicteric, atraumatic, neck supple Neuro: RASS: -3, unable to follow commands, PERRL +3, MAE CV: s1s2 RRR, NSR with LBBB on monitor, no r/m/g Pulm: Regular, non labored on 2 L [place], breath sounds clear-BUL & diminished-BLL GI: soft, rounded, bs x 4 Skin: no rashes/lesions noted Extremities: warm/dry, pulses + 2 R/P, no edema noted  Resolved Hospital Problem list     Assessment & Plan:  Sepsis without septic shock due to unknown source Suspected Meningitis due to AMS and fever Possible Aspiration secondary to vomiting UA: +protein, CXR: negative, CT: negative  Initial interventions/workup included: 1 L of NS & Cefepime/ Vancomycin/ Metronidazole - will add additional 500 mL bolus to complete 20 mg/kg (HFrEF history with elevated BNP) - Supplemental oxygen as needed, to maintain SpO2 > 90% - f/u cultures, trend  lactic/ PCT - Daily CBC, monitor WBC/ fever curve - IV antibiotics per primary service: ampicillin, acyclovir, ceftriaxone, flagyl & vancomycin. Consider discontinuing Flagyl due to negative CT abdomen/pelvis. - IVF hydration as needed: lowered continuous IVF to 100 mL/h due to additional bolus - IR consulted by primary service in AM for evaluation of LP - consider ID consult - droplet precautions  Acute Hypoxic Respiratory Failure secondary to possible aspiration in the setting of sepsis, COPD, asthma & OSA ABG reassuring, patient only requiring 2 L [place], high risk for intubation based on neuro status - Supplemental O2 to maintain SpO2 > 90% - Intermittent chest x-ray & ABG PRN - Ensure adequate pulmonary hygiene  - F/u cultures, trend PCT - antibiotics as above - bronchodilators PRN  Chronic HFrEF without current exacerbation NICM LBBB - f/u BNP, monitor for overload - Continuous cardiac monitoring  - Daily weights to assess volume status - Diurese as tolerated, home meds per primary service  Acute encephalopathy suspect secondary to infectious process in the setting of sepsis d/t unknown etiology - Q 4 neuro checks - Minimize sedation, especially Benzos as tolerated - Emphasize sleep/wake cycle  CKD Stage IV  - Strict I/O's: alert provider if UOP < 0.5 mL/kg/hr - Daily BMP, replace electrolytes PRN - Avoid nephrotoxic agents as able, ensure adequate renal perfusion  Type 2 Diabetes Mellitus Hemoglobin A1C: pending - Monitor CBG Q 4 hours - SSI moderate dosing - target range while in ICU: 140-180 - follow ICU hyper/hypo-glycemia protocol  Best Practice (right click  and "Reselect all SmartList Selections" daily)  Diet/type: NPO DVT prophylaxis: SCD GI prophylaxis: PPI Lines: N/A Foley:  N/A Code Status:  full code Last date of multidisciplinary goals of care discussion [07/30/21]  Labs   CBC: Recent Labs  Lab 07/30/21 1803  WBC 11.3*  NEUTROABS 9.3*  HGB 10.0*   HCT 31.5*  MCV 97.2  PLT 161    Basic Metabolic Panel: Recent Labs  Lab 07/30/21 1803  NA 140  K 3.7  CL 107  CO2 27  GLUCOSE 195*  BUN 22  CREATININE 1.38*  CALCIUM 9.3   GFR: Estimated Creatinine Clearance: 35.8 mL/min (A) (by C-G formula based on SCr of 1.38 mg/dL (H)). Recent Labs  Lab 07/30/21 1803 07/30/21 2100  WBC 11.3*  --   LATICACIDVEN 2.2* 3.5*    Liver Function Tests: Recent Labs  Lab 07/30/21 1803  AST 20  ALT 13  ALKPHOS 39  BILITOT 0.9  PROT 6.4*  ALBUMIN 3.8   No results for input(s): "LIPASE", "AMYLASE" in the last 168 hours. No results for input(s): "AMMONIA" in the last 168 hours.  ABG No results found for: "PHART", "PCO2ART", "PO2ART", "HCO3", "TCO2", "ACIDBASEDEF", "O2SAT"   Coagulation Profile: Recent Labs  Lab 07/30/21 2100  INR 1.1    Cardiac Enzymes: No results for input(s): "CKTOTAL", "CKMB", "CKMBINDEX", "TROPONINI" in the last 168 hours.  HbA1C: Hemoglobin A1C  Date/Time Value Ref Range Status  09/03/2012 10:30 AM 7.5 (H) 4.2 - 6.3 % Final    Comment:    The American Diabetes Association recommends that a primary goal of therapy should be <7% and that physicians should reevaluate the treatment regimen in patients with HbA1c values consistently >8%.    Hgb A1c MFr Bld  Date/Time Value Ref Range Status  10/30/2018 08:52 AM 6.0 (H) 4.8 - 5.6 % Final    Comment:    (NOTE)         Prediabetes: 5.7 - 6.4         Diabetes: >6.4         Glycemic control for adults with diabetes: <7.0     CBG: No results for input(s): "GLUCAP" in the last 168 hours.  Review of Systems:   UTA- patient altered and unable to participate in interview  Past Medical History:  She,  has a past medical history of Asthma, Cardiomyopathy, nonischemic ([place]), CHF (congestive heart failure) ([place]), COPD (chronic obstructive pulmonary disease) ([place]), Diabetes mellitus, type 2 ([place]), Gastric ulcer without hemorrhage or perforation, GERD  (gastroesophageal reflux disease), HTN (hypertension), Hyperlipidemia, IDA (iron deficiency anemia) (04/05/2020), Obstructive sleep apnea on CPAP, Osteoarthritis, and Scoliosis.   Surgical History:   Past Surgical History:  Procedure Laterality Date   ABDOMINAL HYSTERECTOMY     ankle (other)     APPENDECTOMY     BREAST BIOPSY Right 1999   neg   BREAST CYST ASPIRATION     neg   BREAST SURGERY     CARDIAC CATHETERIZATION     CLAVICLE SURGERY Right    Fx   COLONOSCOPY WITH PROPOFOL N/A 07/10/2016   Procedure: COLONOSCOPY WITH PROPOFOL;  Surgeon: Lollie Sails, MD;  Location: [facility] ENDOSCOPY;  Service: Endoscopy;  Laterality: N/A;   ear (otheR)     ESOPHAGOGASTRODUODENOSCOPY N/A 05/11/2019   Procedure: ESOPHAGOGASTRODUODENOSCOPY (EGD);  Surgeon: Toledo, Benay Pike, MD;  Location: [facility] ENDOSCOPY;  Service: Gastroenterology;  Laterality: N/A;   ESOPHAGOGASTRODUODENOSCOPY (EGD) WITH PROPOFOL N/A 07/10/2016   Procedure: ESOPHAGOGASTRODUODENOSCOPY (EGD) WITH PROPOFOL;  Surgeon: Loistine Simas  U, MD;  Location: [facility] ENDOSCOPY;  Service: Endoscopy;  Laterality: N/A;   ESOPHAGOGASTRODUODENOSCOPY (EGD) WITH PROPOFOL N/A 09/17/2017   Procedure: ESOPHAGOGASTRODUODENOSCOPY (EGD) WITH PROPOFOL;  Surgeon: Lollie Sails, MD;  Location: [facility] ENDOSCOPY;  Service: Endoscopy;  Laterality: N/A;   feet (other)     HERNIA REPAIR     sinus (other)     stomach     UPPER ESOPHAGEAL ENDOSCOPIC ULTRASOUND (EUS) N/A 11/22/2016   Procedure: UPPER ESOPHAGEAL ENDOSCOPIC ULTRASOUND (EUS);  Surgeon: Reita Cliche, MD;  Location: [facility] ENDOSCOPY;  Service: Gastroenterology;  Laterality: N/A;     Social History:   reports that she quit smoking about 39 years ago. Her smoking use included cigarettes. She has a 15.00 pack-year smoking history. She has never used smokeless tobacco. She reports that she does not drink alcohol and does not use drugs.   Family History:  Her family history includes Alzheimer's  disease in an other family member; Breast cancer in her maternal aunt; Cancer in her brother, daughter, and maternal aunt; Dementia in her father; Heart attack in her mother; Hypertension in her mother.   Allergies Allergies  Allergen Reactions   Amoxicillin Other (See Comments)    GI distress   Aspirin Diarrhea and Nausea And Vomiting   Celebrex [Celecoxib] Nausea Only   Latex Itching and Swelling   Penicillins Other (See Comments)    GI distress   Salicylates    Allegra [Fexofenadine] Cough     Home Medications  Prior to Admission medications   Medication Sig Start Date End Date Taking? Authorizing Provider  albuterol (PROVENTIL HFA;VENTOLIN HFA) 108 (90 BASE) MCG/ACT inhaler Inhale 2 puffs into the lungs every 6 (six) hours as needed for wheezing or shortness of breath.    [provider]  aspirin 81 MG tablet Take 81 mg by mouth daily. Patient not taking: Reported on 11/29/2020    [provider]  BIDIL 20-37.5 MG tablet TAKE 1 TABLET BY MOUTH THREE TIMES DAILY Patient taking differently: Take 1 tablet by mouth 3 (three) times daily. 05/20/17   Alisa Graff, FNP  Biotin 1000 MCG tablet Take 1,000 mcg by mouth daily.     [provider]  calcium carbonate (OS-CAL) 600 MG TABS tablet Take 600 mg by mouth 2 (two) times daily with a meal.     [provider]  cetirizine (ZYRTEC) 10 MG tablet Take 10 mg by mouth daily.    [provider]  cholecalciferol (VITAMIN D) 400 units TABS tablet Take 2,000 Units by mouth daily.     [provider]  [facility] Liver Oil w/Vit A & D CAPS Take 1 capsule by mouth daily.    [provider]  ENTRESTO 97-103 MG TAKE 1 TABLET BY MOUTH TWICE DAILY Patient not taking: Reported on 08/16/2020 09/18/17   Alisa Graff, FNP  fluticasone [facility]) 50 MCG/ACT nasal spray Place into the nose. 01/30/19 08/16/20  [provider]  Fluticasone-Salmeterol (ADVAIR) 500-50 MCG/DOSE AEPB Inhale 1 puff  into the lungs 2 (two) times daily.    [provider]  furosemide (LASIX) 40 MG tablet Take 40 mg by mouth daily.    [provider]  glimepiride (AMARYL) 2 MG tablet Take 2 mg by mouth daily with breakfast.    [provider]  Iron-Vitamin C (VITRON-C) 65-125 MG TABS Take 1 tablet by mouth daily. 10/08/19   Earlie Server, MD  levocetirizine (XYZAL) 5 MG tablet Take 5 mg by mouth every evening.  [provider]  meloxicam (MOBIC) 7.5 MG tablet Take 7.5 mg by mouth 2 (two) times daily as needed for pain. Takes one every other day    [provider]  metFORMIN (GLUCOPHAGE) 500 MG tablet Take 1,000 mg by mouth 2 (two) times daily with a meal.    [provider]  metoprolol (TOPROL-XL) 200 MG 24 hr tablet TAKE 1 TABLET BY MOUTH ONCE DAILY. TAKE WITH OR IMMEDIATELY FOLLOWING A MEAL. Patient not taking: Reported on 11/29/2020 05/20/17   Darylene Price A, FNP  montelukast (SINGULAIR) 10 MG tablet Take 10 mg by mouth at bedtime.    [provider]  Multiple Vitamin (MULTIVITAMIN) tablet Take 1 tablet by mouth daily.     [provider]  Omega 3-6-9 Fatty Acids (TRIPLE OMEGA-3-6-9) CAPS Take 1 capsule by mouth daily.    [provider]  omeprazole (PRILOSEC) 20 MG capsule Take 20 mg by mouth 2 (two) times daily before a meal.    [provider]  ondansetron (ZOFRAN-ODT) 4 MG disintegrating tablet Take 4 mg every 8 (eight) hours as needed by mouth for nausea or vomiting.    [provider]  pantoprazole (PROTONIX) 40 MG tablet Take 40 mg by mouth 2 (two) times daily.    [provider]  potassium chloride (K-DUR) 10 MEQ tablet TAKE 1 TABLET BY MOUTH ONCE DAILY Patient not taking: Reported on 11/29/2020 09/18/17   Alisa Graff, FNP  predniSONE (DELTASONE) 10 MG tablet Take by mouth. Patient not taking: Reported on 08/16/2020 06/01/19   [provider]  rosuvastatin (CRESTOR) 20 MG tablet Take 20 mg  by mouth daily.    [provider]  Semaglutide,0.25 or 0.'5MG'$ /DOS, (OZEMPIC, 0.25 OR 0.5 MG/DOSE,) 2 MG/1.5ML SOPN Inject 0.375 mLs into the skin once a week. Patient not taking: Reported on 04/08/2020 10/22/18   [provider]  Tiotropium Bromide Monohydrate 1.25 MCG/ACT AERS Inhale 2 puffs into the lungs daily.    [provider]  traMADol-acetaminophen (ULTRACET) 37.5-325 MG tablet Take 1 tablet by mouth every 6 (six) hours as needed. Takes twice a day    [provider]  traZODone (DESYREL) 100 MG tablet Take 100 mg by mouth at bedtime.    [provider]  vitamin B-12 (CYANOCOBALAMIN) 500 MCG tablet Take 500 mcg daily by mouth.    [provider]  vitamin C (ASCORBIC ACID) 500 MG tablet Take 500 mg by mouth daily.     [provider]  vitamin E 400 UNIT capsule Take 400 Units by mouth daily.    [provider]     Critical care time: 62 minutes       Venetia Night, AGACNP-BC Acute Care Nurse Practitioner [facility]   [phone] / [phone] Please see Amion for pager details.

## 2021-07-30 NOTE — Progress Notes (Addendum)
Pharmacy Antibiotic Note  Diane Patton is a 76 y.o. female admitted on 07/30/2021 with meningitis.  Pharmacy has been consulted for Vanc, Cefepime, Acyclovir dosing.  PCN allergy is GI distress, pt tolerated ceftriaxone previously.   - MD switched cefepime to ceftriaxone and ampicillin to cover listeria, cefepime d/c'd.     Plan: Vancomycin 1 gm IV X 1 given in ED on 6/11 @ 2054. Additional Vanc 750 mg IV X 1 ordered to make total loading dose of 1750 mg. Vancomycin 1 gm IV Q24H ordered to start on 6/12 @ 2100.   Cefepime 2 gm IV X 1 given in ED on 6/11 @ 1844. Cefepime 2 gm IV Q12H ordered to start on 6/12 @ 0700. - cefepime d/c'd and switched to ceftriaxone 2 gm IV Q12H and  Ampicillin 2 gm IV Q6H.   Acyclovir 770 mg (10 mg/kg) IV Q12H ordered to start on 6/11 @ ~ 2300.    Height: '5\' 6"'$  (167.6 cm) Weight: 77.1 kg (170 lb) IBW/kg (Calculated) : 59.3  Temp (24hrs), Avg:102 F (38.9 C), Min:99.2 F (37.3 C), Max:103.9 F (39.9 C)  Recent Labs  Lab 07/30/21 1803 07/30/21 2100  WBC 11.3*  --   CREATININE 1.38*  --   LATICACIDVEN 2.2* 3.5*    Estimated Creatinine Clearance: 35.8 mL/min (A) (by C-G formula based on SCr of 1.38 mg/dL (H)).    Allergies  Allergen Reactions   Amoxicillin Other (See Comments)    GI distress   Aspirin Diarrhea and Nausea And Vomiting   Celebrex [Celecoxib] Nausea Only   Latex Itching and Swelling   Penicillins Other (See Comments)    GI distress   Salicylates    Allegra [Fexofenadine] Cough    Antimicrobials this admission:   >>    >>   Dose adjustments this admission:   Microbiology results:  BCx:   UCx:    Sputum:    MRSA PCR:   Thank you for allowing pharmacy to be a part of this patient's care.  Lesly Joslyn D 07/30/2021 10:11 PM

## 2021-07-30 NOTE — ED Notes (Signed)
Pt transported to CT with RN and CT tech. Pt placed on CT table. Pt vomited while laying prone during the scan. RN and CT tech sat pt up immediatly. RN assessed pt for any respiratory decline at that time, no findings. Pt scanned continued. Pt was placed on [place] at the same liters/min as prior documented. Pt brought back to ED. RN assessed pt lung field and documented as such. RN notified provider of event.

## 2021-07-31 ENCOUNTER — Inpatient Hospital Stay: Payer: Medicare Other

## 2021-07-31 DIAGNOSIS — G039 Meningitis, unspecified: Secondary | ICD-10-CM | POA: Diagnosis not present

## 2021-07-31 DIAGNOSIS — R001 Bradycardia, unspecified: Secondary | ICD-10-CM

## 2021-07-31 DIAGNOSIS — G9341 Metabolic encephalopathy: Secondary | ICD-10-CM | POA: Diagnosis not present

## 2021-07-31 DIAGNOSIS — I471 Supraventricular tachycardia: Secondary | ICD-10-CM | POA: Diagnosis not present

## 2021-07-31 DIAGNOSIS — R6521 Severe sepsis with septic shock: Secondary | ICD-10-CM

## 2021-07-31 DIAGNOSIS — R4182 Altered mental status, unspecified: Secondary | ICD-10-CM | POA: Diagnosis not present

## 2021-07-31 DIAGNOSIS — B953 Streptococcus pneumoniae as the cause of diseases classified elsewhere: Secondary | ICD-10-CM

## 2021-07-31 DIAGNOSIS — J9601 Acute respiratory failure with hypoxia: Secondary | ICD-10-CM

## 2021-07-31 DIAGNOSIS — A419 Sepsis, unspecified organism: Secondary | ICD-10-CM | POA: Diagnosis not present

## 2021-07-31 LAB — CBC
HCT: 35.6 % — ABNORMAL LOW (ref 36.0–46.0)
Hemoglobin: 11.1 g/dL — ABNORMAL LOW (ref 12.0–15.0)
MCH: 30.8 pg (ref 26.0–34.0)
MCHC: 31.2 g/dL (ref 30.0–36.0)
MCV: 98.9 fL (ref 80.0–100.0)
Platelets: 308 10*3/uL (ref 150–400)
RBC: 3.6 MIL/uL — ABNORMAL LOW (ref 3.87–5.11)
RDW: 12.9 % (ref 11.5–15.5)
WBC: 25.3 10*3/uL — ABNORMAL HIGH (ref 4.0–10.5)
nRBC: 0 % (ref 0.0–0.2)

## 2021-07-31 LAB — BLOOD GAS, ARTERIAL
Acid-base deficit: 4.3 mmol/L — ABNORMAL HIGH (ref 0.0–2.0)
Bicarbonate: 21.6 mmol/L (ref 20.0–28.0)
FIO2: 100 %
MECHVT: 470 mL
Mechanical Rate: 16
O2 Saturation: 99.6 %
PEEP: 5 cmH2O
Patient temperature: 37
pCO2 arterial: 42 mmHg (ref 32–48)
pH, Arterial: 7.32 — ABNORMAL LOW (ref 7.35–7.45)
pO2, Arterial: 194 mmHg — ABNORMAL HIGH (ref 83–108)

## 2021-07-31 LAB — PATHOLOGIST SMEAR REVIEW

## 2021-07-31 LAB — BLOOD CULTURE ID PANEL (REFLEXED) - BCID2

## 2021-07-31 LAB — VANCOMYCIN, RANDOM: Vancomycin Rm: 11 ug/mL

## 2021-07-31 LAB — CSF CELL COUNT WITH DIFFERENTIAL
Eosinophils, CSF: 1 %
Lymphs, CSF: 4 %
Monocyte-Macrophage-Spinal Fluid: 4 %
RBC Count, CSF: 0 /mm3 (ref 0–3)
Segmented Neutrophils-CSF: 91 %
Tube #: 3
WBC, CSF: 9646 /mm3 (ref 0–5)

## 2021-07-31 LAB — HEMOGLOBIN A1C
Hgb A1c MFr Bld: 6.7 % — ABNORMAL HIGH (ref 4.8–5.6)
Mean Plasma Glucose: 145.59 mg/dL

## 2021-07-31 LAB — GLUCOSE, CAPILLARY
Glucose-Capillary: 192 mg/dL — ABNORMAL HIGH (ref 70–99)
Glucose-Capillary: 200 mg/dL — ABNORMAL HIGH (ref 70–99)
Glucose-Capillary: 217 mg/dL — ABNORMAL HIGH (ref 70–99)
Glucose-Capillary: 228 mg/dL — ABNORMAL HIGH (ref 70–99)
Glucose-Capillary: 232 mg/dL — ABNORMAL HIGH (ref 70–99)
Glucose-Capillary: 232 mg/dL — ABNORMAL HIGH (ref 70–99)
Glucose-Capillary: 301 mg/dL — ABNORMAL HIGH (ref 70–99)

## 2021-07-31 LAB — COMPREHENSIVE METABOLIC PANEL
ALT: 18 U/L (ref 0–44)
AST: 26 U/L (ref 15–41)
Albumin: 3.3 g/dL — ABNORMAL LOW (ref 3.5–5.0)
Alkaline Phosphatase: 36 U/L — ABNORMAL LOW (ref 38–126)
Anion gap: 9 (ref 5–15)
BUN: 23 mg/dL (ref 8–23)
CO2: 24 mmol/L (ref 22–32)
Calcium: 8.8 mg/dL — ABNORMAL LOW (ref 8.9–10.3)
Chloride: 106 mmol/L (ref 98–111)
Creatinine, Ser: 1.52 mg/dL — ABNORMAL HIGH (ref 0.44–1.00)
GFR, Estimated: 35 mL/min — ABNORMAL LOW (ref >60)
Glucose, Bld: 321 mg/dL — ABNORMAL HIGH (ref 70–99)
Potassium: 3.6 mmol/L (ref 3.5–5.1)
Sodium: 139 mmol/L (ref 135–145)
Total Bilirubin: 1.1 mg/dL (ref 0.3–1.2)
Total Protein: 6.4 g/dL — ABNORMAL LOW (ref 6.5–8.1)

## 2021-07-31 LAB — GLUCOSE, CSF: Glucose, CSF: 101 mg/dL — ABNORMAL HIGH (ref 40–70)

## 2021-07-31 LAB — PROCALCITONIN: Procalcitonin: 34.44 ng/mL

## 2021-07-31 LAB — CORTISOL-AM, BLOOD: Cortisol - AM: 17.1 ug/dL (ref 6.7–22.6)

## 2021-07-31 LAB — TSH: TSH: 0.517 u[IU]/mL (ref 0.350–4.500)

## 2021-07-31 LAB — PHOSPHORUS
Phosphorus: 2.3 mg/dL — ABNORMAL LOW (ref 2.5–4.6)
Phosphorus: 3.7 mg/dL (ref 2.5–4.6)

## 2021-07-31 LAB — PROTIME-INR
INR: 1.2 (ref 0.8–1.2)
Prothrombin Time: 15.2 seconds (ref 11.4–15.2)

## 2021-07-31 LAB — MAGNESIUM
Magnesium: 1.2 mg/dL — ABNORMAL LOW (ref 1.7–2.4)
Magnesium: 3 mg/dL — ABNORMAL HIGH (ref 1.7–2.4)

## 2021-07-31 LAB — LACTIC ACID, PLASMA: Lactic Acid, Venous: 4.1 mmol/L (ref 0.5–1.9)

## 2021-07-31 LAB — PROTEIN, CSF: Total  Protein, CSF: 425 mg/dL — ABNORMAL HIGH (ref 15–45)

## 2021-07-31 MED ORDER — IPRATROPIUM-ALBUTEROL 0.5-2.5 (3) MG/3ML IN SOLN
3.0000 mL | Freq: Four times a day (QID) | RESPIRATORY_TRACT | Status: DC | PRN
Start: 2021-07-31 — End: 2021-07-31

## 2021-07-31 MED ORDER — PROPOFOL 1000 MG/100ML IV EMUL
INTRAVENOUS | Status: AC
  Filled 2021-07-31: qty 100

## 2021-07-31 MED ORDER — DOPAMINE-DEXTROSE 3.2-5 MG/ML-% IV SOLN
0.0000 ug/kg/min | INTRAVENOUS | Status: DC
  Administered 2021-07-31: 5 ug/kg/min via INTRAVENOUS
  Filled 2021-07-31: qty 250

## 2021-07-31 MED ORDER — FENTANYL CITRATE (PF) 100 MCG/2ML IJ SOLN: 100.0000 ug | Freq: Once | INTRAMUSCULAR | Status: DC

## 2021-07-31 MED ORDER — POLYETHYLENE GLYCOL 3350 17 G PO PACK
17.0000 g | PACK | Freq: Every day | ORAL | Status: DC
  Administered 2021-07-31 – 2021-08-02 (×3): 17 g
  Filled 2021-07-31 (×3): qty 1

## 2021-07-31 MED ORDER — VANCOMYCIN HCL IN DEXTROSE 1-5 GM/200ML-% IV SOLN
1000.0000 mg | INTRAVENOUS | Status: DC
Start: 2021-07-31 — End: 2021-08-02
  Administered 2021-07-31 – 2021-08-01 (×2): 1000 mg via INTRAVENOUS
  Filled 2021-07-31 (×3): qty 200

## 2021-07-31 MED ORDER — FENTANYL CITRATE PF 50 MCG/ML IJ SOSY
PREFILLED_SYRINGE | INTRAMUSCULAR | Status: AC
  Administered 2021-07-31: 100 ug via INTRAVENOUS
  Filled 2021-07-31: qty 2

## 2021-07-31 MED ORDER — INSULIN DETEMIR 100 UNIT/ML ~~LOC~~ SOLN
8.0000 [IU] | Freq: Two times a day (BID) | SUBCUTANEOUS | Status: DC
  Administered 2021-07-31 – 2021-08-11 (×21): 8 [IU] via SUBCUTANEOUS
  Filled 2021-07-31 (×23): qty 0.08

## 2021-07-31 MED ORDER — FENTANYL BOLUS VIA INFUSION
50.0000 ug | INTRAVENOUS | Status: DC | PRN
  Administered 2021-07-31 – 2021-08-01 (×3): 50 ug via INTRAVENOUS

## 2021-07-31 MED ORDER — VANCOMYCIN VARIABLE DOSE PER UNSTABLE RENAL FUNCTION (PHARMACIST DOSING): Status: DC

## 2021-07-31 MED ORDER — VANCOMYCIN HCL 750 MG/150ML IV SOLN
750.0000 mg | INTRAVENOUS | Status: DC
Start: 2021-07-31 — End: 2021-07-31
  Filled 2021-07-31: qty 150

## 2021-07-31 MED ORDER — MAGNESIUM SULFATE 4 GM/100ML IV SOLN
4.0000 g | Freq: Once | INTRAVENOUS | Status: AC
  Administered 2021-07-31: 4 g via INTRAVENOUS
  Filled 2021-07-31: qty 100

## 2021-07-31 MED ORDER — PROSOURCE TF PO LIQD
45.0000 mL | Freq: Two times a day (BID) | ORAL | Status: DC
Start: 2021-07-31 — End: 2021-08-03
  Administered 2021-07-31 – 2021-08-02 (×4): 45 mL
  Filled 2021-07-31: qty 45

## 2021-07-31 MED ORDER — LABETALOL HCL 5 MG/ML IV SOLN
10.0000 mg | INTRAVENOUS | Status: DC | PRN
  Administered 2021-07-31: 10 mg via INTRAVENOUS
  Filled 2021-07-31: qty 4

## 2021-07-31 MED ORDER — NOREPINEPHRINE 4 MG/250ML-% IV SOLN
2.0000 ug/min | INTRAVENOUS | Status: DC
  Administered 2021-07-31: 5 ug/min via INTRAVENOUS

## 2021-07-31 MED ORDER — VASOPRESSIN 20 UNITS/100 ML INFUSION FOR SHOCK
0.0000 [IU]/min | INTRAVENOUS | Status: DC
  Administered 2021-07-31: 0.03 [IU]/min via INTRAVENOUS
  Filled 2021-07-31: qty 100

## 2021-07-31 MED ORDER — CHLORHEXIDINE GLUCONATE CLOTH 2 % EX PADS
6.0000 | MEDICATED_PAD | Freq: Every day | CUTANEOUS | Status: DC
  Administered 2021-07-31 – 2021-08-11 (×10): 6 via TOPICAL

## 2021-07-31 MED ORDER — IPRATROPIUM-ALBUTEROL 0.5-2.5 (3) MG/3ML IN SOLN
3.0000 mL | Freq: Four times a day (QID) | RESPIRATORY_TRACT | Status: DC
Start: 2021-07-31 — End: 2021-08-03
  Administered 2021-07-31 – 2021-08-02 (×11): 3 mL via RESPIRATORY_TRACT
  Filled 2021-07-31 (×11): qty 3

## 2021-07-31 MED ORDER — ETOMIDATE 2 MG/ML IV SOLN
INTRAVENOUS | Status: AC
  Administered 2021-07-31: 20 mg
  Filled 2021-07-31: qty 10

## 2021-07-31 MED ORDER — FENTANYL CITRATE (PF) 100 MCG/2ML IJ SOLN
INTRAMUSCULAR | Status: AC
  Administered 2021-07-31: 100 ug via INTRAVENOUS
  Filled 2021-07-31: qty 2

## 2021-07-31 MED ORDER — MIDAZOLAM HCL 2 MG/2ML IJ SOLN
INTRAMUSCULAR | Status: AC
  Administered 2021-07-31: 4 mg via INTRAVENOUS
  Filled 2021-07-31: qty 4

## 2021-07-31 MED ORDER — MIDAZOLAM HCL 2 MG/2ML IJ SOLN
4.0000 mg | Freq: Once | INTRAMUSCULAR | Status: AC
Start: 2021-07-31 — End: 2021-07-31

## 2021-07-31 MED ORDER — MIDAZOLAM HCL 2 MG/2ML IJ SOLN
2.0000 mg | INTRAMUSCULAR | Status: DC | PRN
  Administered 2021-07-31 (×2): 2 mg via INTRAVENOUS
  Administered 2021-07-31 (×2): 4 mg via INTRAVENOUS
  Administered 2021-07-31: 2 mg via INTRAVENOUS
  Administered 2021-08-01: 4 mg via INTRAVENOUS
  Filled 2021-07-31 (×3): qty 2
  Filled 2021-07-31 (×4): qty 4

## 2021-07-31 MED ORDER — ETOMIDATE 2 MG/ML IV SOLN
INTRAVENOUS | Status: AC
  Administered 2021-07-31: 20 mg via INTRAVENOUS
  Filled 2021-07-31: qty 10

## 2021-07-31 MED ORDER — NOREPINEPHRINE 4 MG/250ML-% IV SOLN
INTRAVENOUS | Status: AC
  Administered 2021-07-31: 4 mg
  Filled 2021-07-31: qty 250

## 2021-07-31 MED ORDER — INSULIN ASPART 100 UNIT/ML IJ SOLN
3.0000 [IU] | INTRAMUSCULAR | Status: DC
Start: 2021-08-01 — End: 2021-08-02
  Administered 2021-08-01 – 2021-08-02 (×10): 3 [IU] via SUBCUTANEOUS
  Filled 2021-07-31 (×9): qty 1

## 2021-07-31 MED ORDER — ETOMIDATE 2 MG/ML IV SOLN: 20.0000 mg | Freq: Once | INTRAVENOUS | Status: DC

## 2021-07-31 MED ORDER — FENTANYL 2500MCG IN NS 250ML (10MCG/ML) PREMIX INFUSION
0.0000 ug/h | INTRAVENOUS | Status: DC
  Administered 2021-07-31: 50 ug/h via INTRAVENOUS
  Administered 2021-07-31: 150 ug/h via INTRAVENOUS
  Administered 2021-08-01: 175 ug/h via INTRAVENOUS
  Filled 2021-07-31 (×3): qty 250

## 2021-07-31 MED ORDER — VITAL AF 1.2 CAL PO LIQD
1000.0000 mL | ORAL | Status: DC
  Administered 2021-07-31 – 2021-08-01 (×3): 1000 mL

## 2021-07-31 MED ORDER — FREE WATER
30.0000 mL | Status: DC
  Administered 2021-07-31 – 2021-08-02 (×12): 30 mL

## 2021-07-31 MED ORDER — VECURONIUM BROMIDE 10 MG IV SOLR
INTRAVENOUS | Status: AC
  Administered 2021-07-31: 10 mg via INTRAVENOUS
  Filled 2021-07-31: qty 10

## 2021-07-31 MED ORDER — PANTOPRAZOLE SODIUM 40 MG IV SOLR
40.0000 mg | INTRAVENOUS | Status: DC
  Administered 2021-07-31: 40 mg via INTRAVENOUS
  Filled 2021-07-31: qty 10

## 2021-07-31 MED ORDER — FENTANYL CITRATE PF 50 MCG/ML IJ SOSY
25.0000 ug | PREFILLED_SYRINGE | INTRAMUSCULAR | Status: DC | PRN
  Administered 2021-07-31: 50 ug via INTRAVENOUS
  Filled 2021-07-31: qty 1

## 2021-07-31 MED ORDER — METOPROLOL TARTRATE 5 MG/5ML IV SOLN
INTRAVENOUS | Status: AC
  Filled 2021-07-31: qty 5

## 2021-07-31 MED ORDER — INSULIN DETEMIR 100 UNIT/ML ~~LOC~~ SOLN
8.0000 [IU] | Freq: Every day | SUBCUTANEOUS | Status: DC
  Administered 2021-07-31: 8 [IU] via SUBCUTANEOUS
  Filled 2021-07-31: qty 0.08

## 2021-07-31 MED ORDER — PANTOPRAZOLE 2 MG/ML SUSPENSION
40.0000 mg | Freq: Every day | ORAL | Status: DC
  Administered 2021-07-31 – 2021-08-02 (×3): 40 mg
  Filled 2021-07-31 (×3): qty 20

## 2021-07-31 MED ORDER — DOCUSATE SODIUM 50 MG/5ML PO LIQD
100.0000 mg | Freq: Two times a day (BID) | ORAL | Status: DC
  Administered 2021-07-31 – 2021-08-02 (×4): 100 mg
  Filled 2021-07-31 (×5): qty 10

## 2021-07-31 MED ORDER — PROPOFOL 1000 MG/100ML IV EMUL
0.0000 ug/kg/min | INTRAVENOUS | Status: DC
  Administered 2021-07-31: 15 ug/kg/min via INTRAVENOUS

## 2021-07-31 MED ORDER — INSULIN ASPART 100 UNIT/ML IJ SOLN
0.0000 [IU] | INTRAMUSCULAR | Status: DC
  Administered 2021-07-31 (×2): 7 [IU] via SUBCUTANEOUS
  Administered 2021-07-31: 15 [IU] via SUBCUTANEOUS
  Administered 2021-07-31: 5 [IU] via SUBCUTANEOUS
  Administered 2021-08-01 (×4): 4 [IU] via SUBCUTANEOUS
  Administered 2021-08-01: 3 [IU] via SUBCUTANEOUS
  Administered 2021-08-01: 7 [IU] via SUBCUTANEOUS
  Administered 2021-08-01 – 2021-08-02 (×4): 3 [IU] via SUBCUTANEOUS
  Filled 2021-07-31 (×16): qty 1

## 2021-07-31 MED ORDER — NOREPINEPHRINE 16 MG/250ML-% IV SOLN
0.0000 ug/min | INTRAVENOUS | Status: DC
  Administered 2021-07-31: 20 ug/min via INTRAVENOUS
  Filled 2021-07-31: qty 250

## 2021-07-31 MED ORDER — FENTANYL CITRATE PF 50 MCG/ML IJ SOSY
25.0000 ug | PREFILLED_SYRINGE | INTRAMUSCULAR | Status: DC | PRN
  Administered 2021-08-02 (×2): 25 ug via INTRAVENOUS
  Filled 2021-07-31: qty 1

## 2021-07-31 MED ORDER — LIDOCAINE HCL (PF) 1 % IJ SOLN
10.0000 mL | Freq: Once | INTRAMUSCULAR | Status: AC
  Administered 2021-07-31: 5 mL
  Filled 2021-07-31: qty 10

## 2021-07-31 MED ORDER — METOPROLOL TARTRATE 5 MG/5ML IV SOLN: 2.5000 mg | Freq: Once | INTRAVENOUS | Status: DC

## 2021-07-31 MED ORDER — VECURONIUM BROMIDE 10 MG IV SOLR: 10.0000 mg | Freq: Once | INTRAVENOUS | Status: AC

## 2021-07-31 MED ORDER — ETOMIDATE 2 MG/ML IV SOLN
20.0000 mg | Freq: Once | INTRAVENOUS | Status: AC
Start: 2021-07-31 — End: 2021-07-31

## 2021-07-31 MED ORDER — CHLORHEXIDINE GLUCONATE 0.12% ORAL RINSE (MEDLINE KIT)
15.0000 mL | Freq: Two times a day (BID) | OROMUCOSAL | Status: DC
  Administered 2021-07-31 – 2021-08-02 (×5): 15 mL via OROMUCOSAL

## 2021-07-31 MED ORDER — MIDAZOLAM HCL 2 MG/2ML IJ SOLN: 4.0000 mg | Freq: Once | INTRAMUSCULAR | Status: AC

## 2021-07-31 MED ORDER — ORAL CARE MOUTH RINSE
15.0000 mL | OROMUCOSAL | Status: DC
  Administered 2021-07-31 – 2021-08-02 (×25): 15 mL via OROMUCOSAL

## 2021-07-31 MED ORDER — FENTANYL CITRATE (PF) 100 MCG/2ML IJ SOLN: 100.0000 ug | Freq: Once | INTRAMUSCULAR | Status: AC

## 2021-07-31 MED ORDER — DEXAMETHASONE SODIUM PHOSPHATE 10 MG/ML IJ SOLN
10.0000 mg | Freq: Four times a day (QID) | INTRAMUSCULAR | Status: DC
  Administered 2021-07-31 (×2): 10 mg via INTRAVENOUS
  Filled 2021-07-31 (×3): qty 1

## 2021-07-31 NOTE — Progress Notes (Signed)
Initial Nutrition Assessment  DOCUMENTATION CODES:   Not applicable  INTERVENTION:   Vital 1.2'@50ml'$ /hr- Initiate at 83m/hr and increase by 137mhr q 8 hours until goal rate is reached.   Pro-Source 458mID via tube, provides 40kcal and 11g of protein per serving   Free water flushes 89m72m hours to maintain tube patency   Regimen provides 1520kcal/day, 112g/day protein and 1153ml65m of free water.   NUTRITION DIAGNOSIS:   Inadequate oral intake related to inability to eat (pt sedated and ventilated) as evidenced by NPO status.  GOAL:   Provide needs based on ASPEN/SCCM guidelines  MONITOR:   Vent status, Labs, Weight trends, TF tolerance, Skin, I & O's  REASON FOR ASSESSMENT:   Ventilator    ASSESSMENT:   75 y/29female with h/o CHF, COPD, DM, GERD, HTN, HLD, CKD, OSA and gastric ulcer who is admitted with AMS, sepsis, bactermia and suspected meningitis.  Pt sedated and ventilated. OGT In place. Will plan to start tube feeds today after scheduled lumbar puncture. Per chart, pt appears weight stable at baseline.   Medications reviewed and include: colace, insulin, protonix, miralax, vancomycin, ceftriaxone, levophed, vasopressin   Labs reviewed: K 3.6 wnl, creat 1.52(H), P 3.7 wnl, Mg 3.0(H) Wbc- 25.3(H) Cbgs- 232, 301 x 24 hrs AIC 6.7(H)- 6/11  Patient is currently intubated on ventilator support MV: 7.5 L/min Temp (24hrs), Avg:100 F (37.8 C), Min:98.8 F (37.1 C), Max:103.9 F (39.9 C)  Propofol: none   MAP- >65mmH50mOP- 750ml  39mITION - FOCUSED PHYSICAL EXAM:  Flowsheet Row Most Recent Value  Orbital Region No depletion  Upper Arm Region No depletion  Thoracic and Lumbar Region No depletion  Buccal Region No depletion  Temple Region No depletion  Clavicle Bone Region Mild depletion  Clavicle and Acromion Bone Region Mild depletion  Scapular Bone Region No depletion  Dorsal Hand No depletion  Patellar Region Moderate depletion  Anterior  Thigh Region Moderate depletion  Posterior Calf Region Moderate depletion  Edema (RD Assessment) None  Hair Reviewed  Eyes Reviewed  Mouth Reviewed  Skin Reviewed  Nails Reviewed   Diet Order:   Diet Order             Diet NPO time specified  Diet effective now                  EDUCATION NEEDS:   No education needs have been identified at this time  Skin:  Skin Assessment: Reviewed RN Assessment (ecchymosis)  Last BM:  pta  Height:   Ht Readings from Last 1 Encounters:  07/30/21 '5\' 6"'$  (1.676 m)    Weight:   Wt Readings from Last 1 Encounters:  07/30/21 79.4 kg    Ideal Body Weight:  59 kg  BMI:  Body mass index is 28.25 kg/m.  Estimated Nutritional Needs:   Kcal:  1465kcal/day  Protein:  105-120g/day  Fluid:  1.5-1.7L/day  Blanche Scovell CKoleen Distance, LDN Please refer to [facility]ION [facility] and/or RD on-call/weekend/after hours pager

## 2021-07-31 NOTE — Procedures (Signed)
Central Venous Catheter Insertion Procedure Note  Argelia Formisano  [id number]  Sep 21, 1945  Date:07/31/21  Time:2:06 AM   Provider Performing:Mikyla Schachter L Rust-Chester   Procedure: Insertion of Non-tunneled Central Venous Catheter(36556) with US guidance (34742)   Indication(s) Medication administration and Difficult access  Consent Unable to obtain consent due to emergent nature of procedure.  Anesthesia Topical only with 1% lidocaine . RSI medications & propofol drip  Timeout Verified patient identification, verified procedure, site/side was marked, verified correct patient position, special equipment/implants available, medications/allergies/relevant history reviewed, required imaging and test results available.  Sterile Technique Maximal sterile technique including full sterile barrier drape, hand hygiene, sterile gown, sterile gloves, mask, hair covering, sterile ultrasound probe cover (if used).  Procedure Description Area of catheter insertion was cleaned with chlorhexidine and draped in sterile fashion.  With real-time ultrasound guidance a central venous catheter was placed into the right internal jugular vein. Nonpulsatile blood flow and easy flushing noted in all ports.  The catheter was sutured in place and sterile dressing applied.  Complications/Tolerance None; patient tolerated the procedure well. Chest X-ray is ordered to verify placement for internal jugular or subclavian cannulation.   Chest x-ray is not ordered for femoral cannulation.  EBL Minimal  Specimen(s) None   Venetia Night, AGACNP-BC Acute Care Nurse Practitioner [place] [facility] [facility]   [phone] / [phone] Please see Amion for pager details.

## 2021-07-31 NOTE — Procedures (Signed)
Intubation Procedure Note  Diane Patton  [id number]  December 23, 1945  Date:07/31/21  Time:2:07 AM   Provider Performing:Nessa Ramaker L [place]    Procedure: Intubation (31500)  Indication(s) Respiratory Failure  Consent Unable to obtain consent due to emergent nature of procedure.   Anesthesia Etomidate, Versed, Fentanyl, and vecuronium. IV infiltrated after initial medications given. Patient required additional dosing in order to sedate her safely   Time Out Verified patient identification, verified procedure, site/side was marked, verified correct patient position, special equipment/implants available, medications/allergies/relevant history reviewed, required imaging and test results available.   Sterile Technique Usual hand hygeine, masks, and gloves were used   Procedure Description Patient positioned in bed supine.  Sedation given as noted above.  Patient was intubated with endotracheal tube using Glidescope.  View was Grade 2 only posterior commissure .  Number of attempts was 1.  Colorimetric CO2 detector was consistent with tracheal placement.   Complications/Tolerance None; patient tolerated the procedure well. Chest X-ray is ordered to verify placement.   EBL Minimal   Specimen(s) None   Venetia Night, AGACNP-BC Acute Care Nurse Practitioner [facility]   [phone] / [phone] Please see Amion for pager details.

## 2021-07-31 NOTE — Progress Notes (Signed)
eLink Physician-Brief Progress Note Patient Name: Diane Patton DOB: Apr 23, 1945 MRN: [id number]   Date of Service  07/31/2021  HPI/Events of Note  76 year old woman with sepsis, ear discharge and altered mental status. Being given meningitis and encephalitis therapy. Agitated, bedside placing a foley catheter at this time and in ICU for close monitoring as remains at risk for needing intubation if her condition declines. Is on Vanc, ceftriaxone, ampicillin, acyclovir and may consider adding meningitis steroid regimen until data can be obtained. Continue close monitoring, trend lactate,. Avoid sedation, plan for LP in AM. ICU NP notified of recommendations. Call if needed.   eICU Interventions  New patient even done      Intervention Category Major Interventions: Sepsis - evaluation and management Evaluation Type: New Patient Evaluation  Saloni Lablanc G Siyah Mault 07/31/2021, 1:11 AM

## 2021-07-31 NOTE — Procedures (Signed)
PROCEDURE SUMMARY:  Successful fluoroscopic guided lumbar puncture Opening pressure 36 cm of H2O Yielded 15 mL of cloudy slightly yellow fluid Closing pressure 16 cm of H2O No immediate complications.  Pt tolerated well.   Specimen was sent for labs.  EBL N/A  Rockney Ghee 07/31/2021 2:51 PM

## 2021-07-31 NOTE — Progress Notes (Signed)
Pharmacy Antibiotic Note  Diane Patton is a 76 y.o. female admitted on 07/30/2021 with meningitis.  Pharmacy has been consulted for Vancomycin dosing.   Today, 07/31/2021 Renal: SCr trending up (monitor UOP_ WBC 25.3 Tm 103.9 6/11 Blood cx: S pneumoniae 6/12 random vancomycin level at   =   Plan: Vancomycin 1 gm IV X 1 given in ED on 6/11 @ 2054. Additional Vanc 750 mg IV X 1 given 6/11 '@2216'$  to make total loading dose of 1750 mg. - check random vancomyicn level this evening,  - Plan to redose when < 20 mcg/mL - goal vancomycin toruhg 15-20 mcg/mL for meningitis - continue on ceftriaxone 2gm IV q12h - monitor renal function - await final susceptibilities  Height: '5\' 6"'$  (167.6 cm) Weight: 79.4 kg (175 lb 0.7 oz) IBW/kg (Calculated) : 59.3  Temp (24hrs), Avg:100 F (37.8 C), Min:98.8 F (37.1 C), Max:103.9 F (39.9 C)  Recent Labs  Lab 07/30/21 1803 07/30/21 2100 07/31/21 0305  WBC 11.3*  --  25.3*  CREATININE 1.38*  --  1.52*  LATICACIDVEN 2.2* 3.5* 4.1*     Estimated Creatinine Clearance: 32.9 mL/min (A) (by C-G formula based on SCr of 1.52 mg/dL (H)).    Allergies  Allergen Reactions   Amoxicillin Other (See Comments)    GI distress   Aspirin Diarrhea and Nausea And Vomiting   Celebrex [Celecoxib] Nausea Only   Latex Itching and Swelling   Penicillins Other (See Comments)    GI distress   Salicylates    Allegra [Fexofenadine] Cough    Antimicrobials this admission: 6/11 cefepime x1 6/12 acyclovir x 1 6/12 ampicillin >> 6/12  6/11 vanco >> 6/12 ceftriaxone >>  Dose adjustments this admission:   Microbiology results:  6/11 BCx: pneumococcus  6/12 Sputum:    MRSA PCR:   Thank you for allowing pharmacy to be a part of this patient's care.  Doreene Eland, PharmD, BCPS, BCIDP Work Cell: [phone] 07/31/2021 1:50 PM

## 2021-07-31 NOTE — IPAL (Signed)
  Interdisciplinary Goals of Care Family Meeting   Date carried out: 07/31/2021  Location of the meeting: Bedside  Member's involved: Physician and Family Member or next of kin      GOALS OF CARE DISCUSSION  The Clinical status was relayed to family in detail- Grand-daughter Shamila, Husband not available  Updated and notified of patients medical condition- Patient remains unresponsive and will not open eyes to command.   Patient is having a weak cough and struggling to remove secretions.   Patient with increased WOB and using accessory muscles to breathe Explained to family course of therapy and the modalities   Patient with Progressive multiorgan failure with a very high probablity of a very minimal chance of meaningful recovery despite all aggressive and optimal medical therapy.  PATIENT REMAINS FULL CODE   Family are satisfied with Plan of action and management. All questions answered  Additional CC time 25 mins   Adelyna Brockman Patricia Pesa, M.D.  [facility]  Medical Director [place] Director [facility] Department

## 2021-07-31 NOTE — Consult Note (Signed)
NAME: Diane Patton  DOB: 1945/05/02  MRN: [id number]  Date/Time: 07/31/2021 11:30 AM  REQUESTING PROVIDER: Dr. Mortimer Fries   REASON FOR CONSULT: Strep pneu bacteremia and meningits ? Diane Patton is a 76 y.o. female with a history of CHF, COPD, diabetes, hypertension, hyperlipidemia presented to the ED on 07/30/2021 with altered mental status.  As per the medical records the family had noted blood in her urine and had called EMS because the patient also was confused.  EMS gave her Haldol and Versed she was very combative In the ED vitals BP of 154/66 pulse 109, temperature 99.2.  EMS had recorded a fever of 101.3.  WBC was 11.3 which later increased to 25.3.  Hb 10, platelet 172, glucose 195, creatinine 1.38, lactate 2.2 increased to 3.5-4.1 CT head without contrast showed no acute intracranial abnormality there was generalized cerebral atrophy with chronic Villarruel matter small vessel ischemic changes.  Chest x-ray admission showed low lung volumes. Blood culture was sent and patient was started on broad-spectrum antibiotic therapy for meningitis/encephalitis.  She was admitted to ICU and was intubated early this morning for acute respiratory failure I am asked to see the patient for the same.  Now the blood culture is positive for Streptococcus pneumoniae...  Past Medical History:  Diagnosis Date   Asthma    Cardiomyopathy, nonischemic (HCC)    CHF (congestive heart failure) (HCC)    COPD (chronic obstructive pulmonary disease) (HCC)    Diabetes mellitus, type 2 (HCC)    Gastric ulcer without hemorrhage or perforation    GERD (gastroesophageal reflux disease)    HTN (hypertension)    Hyperlipidemia    IDA (iron deficiency anemia) 04/05/2020   Obstructive sleep apnea on CPAP    Osteoarthritis    osteo of both knees   Scoliosis     Past Surgical History:  Procedure Laterality Date   ABDOMINAL HYSTERECTOMY     ankle (other)     APPENDECTOMY     BREAST BIOPSY Right 1999   neg   BREAST CYST  ASPIRATION     neg   BREAST SURGERY     CARDIAC CATHETERIZATION     CLAVICLE SURGERY Right    Fx   COLONOSCOPY WITH PROPOFOL N/A 07/10/2016   Procedure: COLONOSCOPY WITH PROPOFOL;  Surgeon: Lollie Sails, MD;  Location: [facility] ENDOSCOPY;  Service: Endoscopy;  Laterality: N/A;   ear (otheR)     ESOPHAGOGASTRODUODENOSCOPY N/A 05/11/2019   Procedure: ESOPHAGOGASTRODUODENOSCOPY (EGD);  Surgeon: Toledo, Benay Pike, MD;  Location: [facility] ENDOSCOPY;  Service: Gastroenterology;  Laterality: N/A;   ESOPHAGOGASTRODUODENOSCOPY (EGD) WITH PROPOFOL N/A 07/10/2016   Procedure: ESOPHAGOGASTRODUODENOSCOPY (EGD) WITH PROPOFOL;  Surgeon: Lollie Sails, MD;  Location: [facility] ENDOSCOPY;  Service: Endoscopy;  Laterality: N/A;   ESOPHAGOGASTRODUODENOSCOPY (EGD) WITH PROPOFOL N/A 09/17/2017   Procedure: ESOPHAGOGASTRODUODENOSCOPY (EGD) WITH PROPOFOL;  Surgeon: Lollie Sails, MD;  Location: [facility] ENDOSCOPY;  Service: Endoscopy;  Laterality: N/A;   feet (other)     HERNIA REPAIR     sinus (other)     stomach     UPPER ESOPHAGEAL ENDOSCOPIC ULTRASOUND (EUS) N/A 11/22/2016   Procedure: UPPER ESOPHAGEAL ENDOSCOPIC ULTRASOUND (EUS);  Surgeon: Reita Cliche, MD;  Location: [facility] ENDOSCOPY;  Service: Gastroenterology;  Laterality: N/A;    Social History   Socioeconomic History   Marital status: Married    Spouse name: Not on file   Number of children: Not on file   Years of education: Not on file   Highest education level: Not  on file  Occupational History   Occupation: retired  Tobacco Use   Smoking status: Former    Packs/day: 1.00    Years: 15.00    Total pack years: 15.00    Types: Cigarettes    Quit date: 07/21/1982    Years since quitting: 39.0   Smokeless tobacco: Never  Vaping Use   Vaping Use: Never used  Substance and Sexual Activity   Alcohol use: No    Alcohol/week: 15.0 standard drinks of alcohol    Types: 15 Standard drinks or equivalent per week    Comment: quit drinking 04/1981    Drug use: No   Sexual activity: Not on file  Other Topics Concern   Not on file  Social History Narrative   Disabled. Regularly exercises.    Social Determinants of Health   Financial Resource Strain: Not on file  Food Insecurity: Not on file  Transportation Needs: Not on file  Physical Activity: Not on file  Stress: Not on file  Social Connections: Not on file  Intimate Partner Violence: Not on file    Family History  Problem Relation Age of Onset   Alzheimer's disease Other    Heart attack Mother    Hypertension Mother    Cancer Brother        lung   Cancer Maternal Aunt        mouth and breast   Breast cancer Maternal Aunt    Cancer Daughter        throat   Dementia Father    Allergies  Allergen Reactions   Amoxicillin Other (See Comments)    GI distress   Aspirin Diarrhea and Nausea And Vomiting   Celebrex [Celecoxib] Nausea Only   Latex Itching and Swelling   Penicillins Other (See Comments)    GI distress   Salicylates    Allegra [Fexofenadine] Cough   I? Current Facility-Administered Medications  Medication Dose Route Frequency Provider Last Rate Last Admin   acetaminophen (TYLENOL) tablet 650 mg  650 mg Oral Q6H PRN Athena Masse, MD       Or   acetaminophen (TYLENOL) suppository 650 mg  650 mg Rectal Q6H PRN Athena Masse, MD       cefTRIAXone (ROCEPHIN) 2 g in sodium chloride 0.9 % 100 mL IVPB  2 g Intravenous Q12H Athena Masse, MD   Stopped at 07/31/21 0815   chlorhexidine gluconate (MEDLINE KIT) (PERIDEX) 0.12 % solution 15 mL  15 mL Mouth Rinse BID Rust-Chester, Britton L, NP   15 mL at 07/31/21 4081   Chlorhexidine Gluconate Cloth 2 % PADS 6 each  6 each Topical Daily Flora Lipps, MD   6 each at 07/31/21 0927   docusate (COLACE) 50 MG/5ML liquid 100 mg  100 mg Per Tube BID Rust-Chester, Britton L, NP   100 mg at 07/31/21 0920   fentaNYL (SUBLIMAZE) bolus via infusion 50 mcg  50 mcg Intravenous Q2H PRN Rust-Chester, Huel Cote, NP   50 mcg at  07/31/21 0643   fentaNYL (SUBLIMAZE) injection 25 mcg  25 mcg Intravenous Q15 min PRN Rust-Chester, Britton L, NP       fentaNYL 2559mg in NS 2580m(1080mml) infusion-PREMIX  0-200 mcg/hr Intravenous Continuous Rust-Chester, Britton L, NP 12.5 mL/hr at 07/31/21 0830 125 mcg/hr at 07/31/21 0830   insulin aspart (novoLOG) injection 0-20 Units  0-20 Units Subcutaneous Q4H Rust-Chester, Britton L, NP   15 Units at 07/31/21 0755   insulin detemir (LEVEMIR) injection 8 Units  8 Units Subcutaneous Daily Rust-Chester, Britton L, NP   8 Units at 07/31/21 0920   ipratropium-albuterol (DUONEB) 0.5-2.5 (3) MG/3ML nebulizer solution 3 mL  3 mL Nebulization Q6H Rust-Chester, Britton L, NP   3 mL at 07/31/21 0816   labetalol (NORMODYNE) injection 10 mg  10 mg Intravenous Q2H PRN Rust-Chester, Britton L, NP   10 mg at 07/31/21 0044   MEDLINE mouth rinse  15 mL Mouth Rinse 10 times per day Rust-Chester, Britton L, NP   15 mL at 07/31/21 0753   midazolam (VERSED) injection 2-4 mg  2-4 mg Intravenous Q15 min PRN Flora Lipps, MD   2 mg at 07/31/21 0739   norepinephrine (LEVOPHED) 16 mg in 296m premix infusion  0-40 mcg/min Intravenous Titrated Rust-Chester, Britton L, NP 7.5 mL/hr at 07/31/21 0830 8 mcg/min at 07/31/21 0830   ondansetron (ZOFRAN) tablet 4 mg  4 mg Oral Q6H PRN DAthena Masse MD       Or   ondansetron ([facility] injection 4 mg  4 mg Intravenous Q6H PRN DAthena Masse MD       pantoprazole sodium (PROTONIX) 40 mg/20 mL oral suspension 40 mg  40 mg Per Tube Daily Rust-Chester, Britton L, NP   40 mg at 07/31/21 0920   polyethylene glycol (MIRALAX / GLYCOLAX) packet 17 g  17 g Per Tube Daily Rust-Chester, Britton L, NP   17 g at 07/31/21 0920   propofol (DIPRIVAN) 1000 MG/100ML infusion            propofol (DIPRIVAN) 1000 MG/100ML infusion  0-50 mcg/kg/min Intravenous Continuous Rust-Chester, Britton L, NP   Stopped at 07/31/21 0330   vancomycin variable dose per unstable renal function (pharmacist  dosing)   Does not apply See admin instructions CBenita Gutter RPH       vasopressin (PITRESSIN) 20 Units in sodium chloride 0.9 % 100 mL infusion-*FOR SHOCK*  0-0.03 Units/min Intravenous Continuous Rust-Chester, Britton L, NP 3 mL/hr at 07/31/21 0830 0.01 Units/min at 07/31/21 0830     Abtx:  Anti-infectives (From admission, onward)    Start     Dose/Rate Route Frequency Ordered Stop   07/31/21 2100  vancomycin (VANCOCIN) IVPB 1000 mg/200 mL premix  Status:  Discontinued        1,000 mg 200 mL/hr over 60 Minutes Intravenous Every 24 hours 07/30/21 2209 07/31/21 0910   07/31/21 0910  vancomycin variable dose per unstable renal function (pharmacist dosing)         Does not apply See admin instructions 07/31/21 0910     07/31/21 0700  metroNIDAZOLE (FLAGYL) IVPB 500 mg  Status:  Discontinued        500 mg 100 mL/hr over 60 Minutes Intravenous Every 12 hours 07/30/21 2123 07/31/21 0217   07/31/21 0700  ceFEPIme (MAXIPIME) 2 g in sodium chloride 0.9 % 100 mL IVPB  Status:  Discontinued        2 g 200 mL/hr over 30 Minutes Intravenous Every 12 hours 07/30/21 2211 07/30/21 2319   07/31/21 0700  cefTRIAXone (ROCEPHIN) 2 g in sodium chloride 0.9 % 100 mL IVPB        2 g 200 mL/hr over 30 Minutes Intravenous Every 12 hours 07/30/21 2323     07/31/21 0015  ampicillin (OMNIPEN) 2 g in sodium chloride 0.9 % 100 mL IVPB  Status:  Discontinued        2 g 300 mL/hr over 20 Minutes Intravenous Every 6 hours 07/30/21 2321 07/31/21 1026  07/30/21 2215  vancomycin (VANCOREADY) IVPB 750 mg/150 mL        750 mg 150 mL/hr over 60 Minutes Intravenous  Once 07/30/21 2200 07/31/21 0130   07/30/21 2200  acyclovir (ZOVIRAX) 770 mg in dextrose 5 % 250 mL IVPB  Status:  Discontinued        10 mg/kg  77.1 kg 265.4 mL/hr over 60 Minutes Intravenous Every 12 hours 07/30/21 2123 07/31/21 0543   07/30/21 1845  ceFEPIme (MAXIPIME) 2 g in sodium chloride 0.9 % 100 mL IVPB        2 g 200 mL/hr over 30 Minutes  Intravenous  Once 07/30/21 1835 07/30/21 1918   07/30/21 1815  aztreonam (AZACTAM) 2 g in sodium chloride 0.9 % 100 mL IVPB  Status:  Discontinued        2 g 200 mL/hr over 30 Minutes Intravenous  Once 07/30/21 1809 07/30/21 1834   07/30/21 1815  metroNIDAZOLE (FLAGYL) IVPB 500 mg        500 mg 100 mL/hr over 60 Minutes Intravenous  Once 07/30/21 1809 07/30/21 2025   07/30/21 1815  vancomycin (VANCOCIN) IVPB 1000 mg/200 mL premix        1,000 mg 200 mL/hr over 60 Minutes Intravenous  Once 07/30/21 1809 07/30/21 2200       REVIEW OF SYSTEMS:  NA Objective:  VITALS:  BP (!) 159/58   Pulse 68   Temp 99.1 F (37.3 C)   Resp 16   Ht 5' 6" (1.676 m)   Wt 79.4 kg   LMP 06/20/1967 (Approximate)   SpO2 97%   BMI 28.25 kg/m  LDA Foley Central line Other drainage tubes PHYSICAL EXAM:  General: intubated Head: Normocephalic, without obvious abnormality, atraumatic. Eyes: Conjunctivae clear, anicteric sclerae. Pupils are equal ENT Nares normal. No drainage or sinus tenderness. Lips, mucosa, and tongue normal. No Thrush  Lungs:b/l crepts Heart: tachycardia Abdomen: Soft, non-tender,not distended. Bowel sounds normal. No masses Extremities: atraumatic, no cyanosis. No edema. No clubbing Skin: No rashes or lesions. Or bruising Lymph: Cervical, supraclavicular normal. Neurologic: Grossly non-focal Pertinent Labs Lab Results CBC    Component Value Date/Time   WBC 25.3 (H) 07/31/2021 0305   RBC 3.60 (L) 07/31/2021 0305   HGB 11.1 (L) 07/31/2021 0305   HGB 9.3 (L) 12/06/2013 0547   HCT 35.6 (L) 07/31/2021 0305   HCT 29.2 (L) 12/06/2013 0547   PLT 308 07/31/2021 0305   PLT 248 12/06/2013 0547   MCV 98.9 07/31/2021 0305   MCV 91 12/06/2013 0547   MCH 30.8 07/31/2021 0305   MCHC 31.2 07/31/2021 0305   RDW 12.9 07/31/2021 0305   RDW 14.3 12/06/2013 0547   LYMPHSABS 1.4 07/30/2021 1803   LYMPHSABS 1.0 12/06/2013 0547   MONOABS 0.5 07/30/2021 1803   MONOABS 0.2 12/06/2013  0547   EOSABS 0.1 07/30/2021 1803   EOSABS 0.0 12/06/2013 0547   BASOSABS 0.0 07/30/2021 1803   BASOSABS 0.0 12/06/2013 0547       Latest Ref Rng & Units 07/31/2021    3:05 AM 07/30/2021    6:03 PM 01/06/2020   10:54 AM  CMP  Glucose 70 - 99 mg/dL 321  195  100   BUN 8 - 23 mg/dL _0 Creatinine 0.44 - 1.00 mg/dL 1.52  1.38  1.18   Sodium 135 - 145 mmol/L 139  140  138   Potassium 3.5 - 5.1 mmol/L 3.6  3.7  3.7   Chloride 98 -  111 mmol/L 106  107  103   CO2 22 - 32 mmol/L _0 Calcium 8.9 - 10.3 mg/dL 8.8  9.3  9.4   Total Protein 6.5 - 8.1 g/dL 6.4  6.4  6.8   Total Bilirubin 0.3 - 1.2 mg/dL 1.1  0.9  0.6   Alkaline Phos 38 - 126 U/L 36  39  37   AST 15 - 41 U/L _1 ALT 0 - 44 U/L _2 Microbiology: Recent Results (from the past 240 hour(s))  Resp Panel by RT-PCR (Flu A&B, Covid) Anterior Nasal Swab     Status: None   Collection Time: 07/30/21  6:20 PM   Specimen: Anterior Nasal Swab  Result Value Ref Range Status   SARS Coronavirus 2 by RT PCR NEGATIVE NEGATIVE Final    Comment: (NOTE) SARS-CoV-2 target nucleic acids are NOT DETECTED.  The SARS-CoV-2 RNA is generally detectable in upper respiratory specimens during the acute phase of infection. The lowest concentration of SARS-CoV-2 viral copies this assay can detect is 138 copies/mL. A negative result does not preclude SARS-Cov-2 infection and should not be used as the sole basis for treatment or other patient management decisions. A negative result may occur with  improper specimen collection/handling, submission of specimen other than nasopharyngeal swab, presence of viral mutation(s) within the areas targeted by this assay, and inadequate number of viral copies(<138 copies/mL). A negative result must be combined with clinical observations, patient history, and epidemiological information. The expected result is Negative.  Fact Sheet for Patients:   EntrepreneurPulse.com.au  Fact Sheet for Healthcare Providers:  IncredibleEmployment.be  This test is no t yet approved or cleared by the [place] FDA and  has been authorized for detection and/or diagnosis of SARS-CoV-2 by FDA under an Emergency Use Authorization (EUA). This EUA will remain  in effect (meaning this test can be used) for the duration of the COVID-19 declaration under Section 564(b)(1) of the Act, 21 U.S.C.section 360bbb-3(b)(1), unless the authorization is terminated  or revoked sooner.       Influenza A by PCR NEGATIVE NEGATIVE Final   Influenza B by PCR NEGATIVE NEGATIVE Final    Comment: (NOTE) The Xpert Xpress SARS-CoV-2/FLU/RSV plus assay is intended as an aid in the diagnosis of influenza from Nasopharyngeal swab specimens and should not be used as a sole basis for treatment. Nasal washings and aspirates are unacceptable for Xpert Xpress SARS-CoV-2/FLU/RSV testing.  Fact Sheet for Patients: EntrepreneurPulse.com.au  Fact Sheet for Healthcare Providers: IncredibleEmployment.be  This test is not yet approved or cleared by the [place] FDA and has been authorized for detection and/or diagnosis of SARS-CoV-2 by FDA under an Emergency Use Authorization (EUA). This EUA will remain in effect (meaning this test can be used) for the duration of the COVID-19 declaration under Section 564(b)(1) of the Act, 21 U.S.C. section 360bbb-3(b)(1), unless the authorization is terminated or revoked.  Performed at [facility], [address] [place], [place] [address]   Blood Culture (routine x 2)     Status: None (Preliminary result)   Collection Time: 07/30/21  6:20 PM   Specimen: BLOOD  Result Value Ref Range Status   Specimen Description BLOOD RAC  Final   Special Requests BOTTLES DRAWN AEROBIC AND ANAEROBIC BCLV  Final   Culture  Setup Time   Final    GRAM POSITIVE  COCCI IN BOTH AEROBIC  AND ANAEROBIC BOTTLES Organism ID to follow CRITICAL RESULT CALLED TO, READ BACK BY AND VERIFIED WITH: Violeta Gelinas 07/31/21 0515 MW Performed at [facility], [address] [place], [place] [address]    Culture GRAM POSITIVE COCCI  Final   Report Status PENDING  Incomplete  Blood Culture ID Panel (Reflexed)     Status: Abnormal   Collection Time: 07/30/21  6:20 PM  Result Value Ref Range Status   Enterococcus faecalis NOT DETECTED NOT DETECTED Final   Enterococcus Faecium NOT DETECTED NOT DETECTED Final   Listeria monocytogenes NOT DETECTED NOT DETECTED Final   Staphylococcus species NOT DETECTED NOT DETECTED Final   Staphylococcus aureus (BCID) NOT DETECTED NOT DETECTED Final   Staphylococcus epidermidis NOT DETECTED NOT DETECTED Final   Staphylococcus lugdunensis NOT DETECTED NOT DETECTED Final   Streptococcus species DETECTED (A) NOT DETECTED Final    Comment: CRITICAL RESULT CALLED TO, READ BACK BY AND VERIFIED WITH: JASON ROBBINS 07/31/21 0515 MW    Streptococcus agalactiae NOT DETECTED NOT DETECTED Final   Streptococcus pneumoniae DETECTED (A) NOT DETECTED Final    Comment: CRITICAL RESULT CALLED TO, READ BACK BY AND VERIFIED WITH: JASON ROBBINS 07/31/21 0515 MW    Streptococcus pyogenes NOT DETECTED NOT DETECTED Final   A.calcoaceticus-baumannii NOT DETECTED NOT DETECTED Final   Bacteroides fragilis NOT DETECTED NOT DETECTED Final   Enterobacterales NOT DETECTED NOT DETECTED Final   Enterobacter cloacae complex NOT DETECTED NOT DETECTED Final   Escherichia coli NOT DETECTED NOT DETECTED Final   Klebsiella aerogenes NOT DETECTED NOT DETECTED Final   Klebsiella oxytoca NOT DETECTED NOT DETECTED Final   Klebsiella pneumoniae NOT DETECTED NOT DETECTED Final   Proteus species NOT DETECTED NOT DETECTED Final   Salmonella species NOT DETECTED NOT DETECTED Final   Serratia marcescens NOT DETECTED NOT DETECTED Final   Haemophilus influenzae NOT  DETECTED NOT DETECTED Final   Neisseria meningitidis NOT DETECTED NOT DETECTED Final   Pseudomonas aeruginosa NOT DETECTED NOT DETECTED Final   Stenotrophomonas maltophilia NOT DETECTED NOT DETECTED Final   Candida albicans NOT DETECTED NOT DETECTED Final   Candida auris NOT DETECTED NOT DETECTED Final   Candida glabrata NOT DETECTED NOT DETECTED Final   Candida krusei NOT DETECTED NOT DETECTED Final   Candida parapsilosis NOT DETECTED NOT DETECTED Final   Candida tropicalis NOT DETECTED NOT DETECTED Final   Cryptococcus neoformans/gattii NOT DETECTED NOT DETECTED Final    Comment: Performed at [facility], [address] [place], [place] [address]  Culture, blood (Routine X 2) w Reflex to ID Panel     Status: None (Preliminary result)   Collection Time: 07/30/21 11:36 PM   Specimen: BLOOD  Result Value Ref Range Status   Specimen Description BLOOD RIGHT WRIST  Final   Special Requests   Final    BOTTLES DRAWN AEROBIC ONLY Blood Culture results may not be optimal due to an inadequate volume of blood received in culture bottles   Culture   Final    NO GROWTH < 12 HOURS Performed at [facility], [address], [place],  [address]    Report Status PENDING  Incomplete    IMAGING RESULTS:  I have personally reviewed the films ?b/l lumg edema- pulmonary edema VS ARDS  Impression/Recommendation encephalopathy, fever and sepsis- meningitis VS encephalitis  Strep pneumo bacteremia- likely causing meningitis as well On ceftriaxone meningitis dose and vanco Need LP  Acute hypoxic reps failure- intubated  ?CHF VS ARDS  AKI- observe closely  as on vanco ? ___________________________________________________ Discussed with her nurse   Note:  This document was prepared using Dragon voice recognition software and may include unintentional dictation errors.

## 2021-07-31 NOTE — Progress Notes (Signed)
Patient on '50mg'$  Fentanyl at beginning of shift. Patient opening eyes and turning head, appeared uncomfortable and non complaint with vent. Fent increased in increments of 25 q15 min. PRN Versed ordered by MD Kasa and '2mg'$  given to help decrease agitation. Patient now resting comfortably and tolerating vent well.

## 2021-07-31 NOTE — Progress Notes (Addendum)
Acute Hypoxic Respiratory Failure secondary to suspected aspiration in the setting of acute metabolic encephalopathy and sepsis due to unknown source PMHx: COPD, asthma, OSA, former smoker - Ventilator settings: PRVC  8 mL/kg, 100% FiO2, 5 PEEP, continue ventilator support & lung protective strategies - Wean PEEP & FiO2 as tolerated, maintain SpO2 > 90% - Head of bed elevated 30 degrees, VAP protocol in place - Plateau pressures less than 30 cm H20  - Intermittent chest x-ray & ABG PRN - Daily WUA with SBT as tolerated  - Ensure adequate pulmonary hygiene  - F/u cultures, trend PCT - Continue antibiotics: ceftriaxone, acyclovir, vancomycin, ampicillin - Steroids initiated: decadron 10 mg Q 6 due to suspected meningitis - Duo nebs Q 6, bronchodilators PRN - PAD protocol in place: continue Fentanyl IVP & Propofol drip  Circulatory shock multifactorial in the setting sedation and sepsis - levophed drip initiated, wean as tolerated to maintain MAP > 65, add vasopressin PRN - continuous cardiac monitoring - STAT labs, f/u lactic and ABG - outpatient cardiac medications on hold due to hypotension  CVC pulled back 3 cm due to CXR finding in R atrium. No complications.  Attempted to update husband, but he was asleep- informed son Diane Patton instead. Called granddaughter Diane Patton as well. All questions and concerns answered at this time.   Diane Patton, AGACNP-BC Acute Care Nurse Practitioner [facility]   [phone] / [phone] Please see Amion for pager details.

## 2021-07-31 NOTE — Progress Notes (Signed)
Assisted with patient transfer to radiology for LP puncture and back to ICU while on the trilogy transport ventilator with no complications.

## 2021-07-31 NOTE — Progress Notes (Signed)
PHARMACY - PHYSICIAN COMMUNICATION CRITICAL VALUE ALERT - BLOOD CULTURE IDENTIFICATION (BCID)  Diane Patton is an 76 y.o. female who presented to [facility] on 07/30/2021 with a chief complaint of sepsis, meningitis  Assessment:  Strep pneumo in 2 of 4 bottles (include suspected source if known)  Name of physician (or Provider) Contacted: Domingo Pulse Rust-Chester, NP   Current antibiotics: Vanc, ampicillin, ceftriaxone, acyclovir   Changes to prescribed antibiotics recommended:  Will d/c acyclovir but continue vanc, ampicillin and ceftriaxone  Results for orders placed or performed during the hospital encounter of 07/30/21  Blood Culture ID Panel (Reflexed) (Collected: 07/30/2021  6:20 PM)  Result Value Ref Range   Enterococcus faecalis NOT DETECTED NOT DETECTED   Enterococcus Faecium NOT DETECTED NOT DETECTED   Listeria monocytogenes NOT DETECTED NOT DETECTED   Staphylococcus species NOT DETECTED NOT DETECTED   Staphylococcus aureus (BCID) NOT DETECTED NOT DETECTED   Staphylococcus epidermidis NOT DETECTED NOT DETECTED   Staphylococcus lugdunensis NOT DETECTED NOT DETECTED   Streptococcus species DETECTED (A) NOT DETECTED   Streptococcus agalactiae NOT DETECTED NOT DETECTED   Streptococcus pneumoniae DETECTED (A) NOT DETECTED   Streptococcus pyogenes NOT DETECTED NOT DETECTED   A.calcoaceticus-baumannii NOT DETECTED NOT DETECTED   Bacteroides fragilis NOT DETECTED NOT DETECTED   Enterobacterales NOT DETECTED NOT DETECTED   Enterobacter cloacae complex NOT DETECTED NOT DETECTED   Escherichia coli NOT DETECTED NOT DETECTED   Klebsiella aerogenes NOT DETECTED NOT DETECTED   Klebsiella oxytoca NOT DETECTED NOT DETECTED   Klebsiella pneumoniae NOT DETECTED NOT DETECTED   Proteus species NOT DETECTED NOT DETECTED   Salmonella species NOT DETECTED NOT DETECTED   Serratia marcescens NOT DETECTED NOT DETECTED   Haemophilus influenzae NOT DETECTED NOT DETECTED   Neisseria meningitidis  NOT DETECTED NOT DETECTED   Pseudomonas aeruginosa NOT DETECTED NOT DETECTED   Stenotrophomonas maltophilia NOT DETECTED NOT DETECTED   Candida albicans NOT DETECTED NOT DETECTED   Candida auris NOT DETECTED NOT DETECTED   Candida glabrata NOT DETECTED NOT DETECTED   Candida krusei NOT DETECTED NOT DETECTED   Candida parapsilosis NOT DETECTED NOT DETECTED   Candida tropicalis NOT DETECTED NOT DETECTED   Cryptococcus neoformans/gattii NOT DETECTED NOT DETECTED    Makaylen Thieme D 07/31/2021  5:44 AM

## 2021-07-31 NOTE — Progress Notes (Signed)
NAME:  Diane Patton, MRN:  [id number], DOB:  1945/05/15, LOS: 1 ADMISSION DATE:  07/30/2021, CONSULTATION DATE:  07/30/2021 REFERRING MD:  Dr. Damita Dunnings, CHIEF COMPLAINT:  Altered Mental Status   History of Present Illness/SYNOPSIS  76 yo F presenting to [facility] ED from home via EMS due to altered mental status and bloody drainage from L ear. The patient is too altered to participate in interview, history obtained by Pati Gallo the patient's husband. She lives with her husband and son, baseline is independent of ADL's with use of a cane due to a remote ankle injury. She has been intermittently fatigued over the last 6 months. She has not "needed" her oxygen or CPAP for the last 2-3 years, per husband this was based off of how the patient felt. He confirmed she was in this state of health until Friday 07/28/21 when she began complaining of a headache and L ear pain. Today, Sunday 07/30/21, she appeared confused talking on a spoon as if it was a telephone. She also didn't recognize her son or husband. Husband denies fever/chills/ chest pain/ congestion or unusual shortness of breath. He denied any nausea/ diarrhea or vomiting- however when EMS arrived she did vomit. Per EMS report to ED staff and ED documentation, patient was confused and combative on arrival. EMS administered 5 mg of haldol and 2.5 mg of versed. Patient febrile at 101.3, repeating words inappropriately and vomiting with CBG of 221.  ED course: concern regarding mentation and airway protection. Patient increasingly febrile with current TMAX 103.9. Patient's RASS - 3, with mumbling words but oxygenating and ventilating, ABG reassuring. CT head negative.  Chemistry: Na+: 140, K+: 3.7, BUN/Cr.: 22/1.38, Serum CO2/ AG: 27/6 Hematology: WBC: 11.3, Hgb: 10,  BNP: 486.9, Lactic/ PCT: 2.2 > 3.5/ <0.10,  COVID-19 & Influenza A/B: negative ABG: 7.44/ 32/ 119/ 21.7 CXR 07/30/21: Low lung volumes without evidence of acute cardiopulmonary diesase CT  chest/abdomen/pelvis wo contrast 07/30/21: No acute intrathoracic, abdominal or pelvic pathology. Colonic diverticulosis. No bowel obstruction. Aortic atherosclerosis CT head wo contrast 07/30/21: No acute intracranial abnormality. Generalized cerebral atrophy with chronic Wentzel matter small vessel changes  PCCM consulted for additional management and monitoring due to Acute encephalopathy and Sepsis with unknown source as a HIGH RISK FOR INTUBATION.  Pertinent  Medical History  Asthma HFrEF (echo 5/23: LVEF 35-40%, mild MR & TR) LBBB CKD Stage IV NICM COPD T2DM HTN HLD Iron deficiency Anemia OSA on CPAP Scoliosis Osteoarthritis Former smoker (quit 1984) Gastric Ulcer without perforation  Significant Hospital Events: Including procedures, antibiotic start and stop dates in addition to other pertinent events   07/30/21: Admit to SDU with Sepsis d/t unknown source (suspicious for meningitis) and AMS with PCCM consult due to [place] 6/11 patient emergently intubated, RT CVL placed   Interim History / Subjective:  SEVERE SEPTIC SHOCK SEVERE HYPOXIA AND RESP FAILURE MULTIORGAN FAILURE MULTIPLE PRESSORS   Objective   Blood pressure (!) 152/47, pulse 70, temperature 98.8 F (37.1 C), resp. rate 16, height '5\' 6"'$  (1.676 m), weight 79.4 kg, last menstrual period 06/20/1967, SpO2 96 %.    Vent Mode: PRVC FiO2 (%):  [40 %-100 %] 70 % Set Rate:  [16 bmp] 16 bmp Vt Set:  [470 mL] 470 mL PEEP:  [5 cmH20] 5 cmH20   Intake/Output Summary (Last 24 hours) at 07/31/2021 0710 Last data filed at 07/31/2021 0600 Gross per 24 hour  Intake 689.84 ml  Output 750 ml  Net -60.16 ml  Filed Weights   07/30/21 1806 07/30/21 2259  Weight: 77.1 kg 79.4 kg    REVIEW OF SYSTEMS  PATIENT IS UNABLE TO PROVIDE COMPLETE REVIEW OF SYSTEMS DUE TO SEVERE CRITICAL ILLNESS AND TOXIC METABOLIC ENCEPHALOPATHY    PHYSICAL EXAMINATION:  GENERAL:critically ill appearing, +resp  distress EYES: Pupils equal, round, reactive to light.  No scleral icterus.  MOUTH: Moist mucosal membrane. INTUBATED NECK: Supple.  PULMONARY: +rhonchi, +wheezing CARDIOVASCULAR: S1 and S2.  No murmurs  GASTROINTESTINAL: Soft, nontender, -distended. Positive bowel sounds.  MUSCULOSKELETAL: No swelling, clubbing, or edema.  NEUROLOGIC: obtunded SKIN:intact,warm,dry     Assessment & Plan:  76 yo AA female with severe Sepsis with septic shock POA due to highly Suspected Meningitis with  AMS and fever with Possible Aspiration secondary to vomiting associated with severe acidosis and severe toxic metabolic encephalopathy  STREP PNEUMONIA BACTERAMIA    Severe ACUTE Hypoxic and Hypercapnic Respiratory Failure -continue Mechanical Ventilator support -Wean Fio2 and PEEP as tolerated -VAP/VENT bundle implementation - Wean PEEP & FiO2 as tolerated, maintain SpO2 > 88% - Head of bed elevated 30 degrees, VAP protocol in place - Plateau pressures less than 30 cm H20  - Intermittent chest x-ray & ABG PRN - Ensure adequate pulmonary hygiene  -will NOT perform SAT/SBT   SEPTIC shock SOURCE-Meningitis -use vasopressors to keep MAP>65 as needed -follow ABG and LA as needed -emperic ABX -stress dose steroids -aggressive IV fluid Resuscitation   NEUROLOGY ACUTE TOXIC METABOLIC ENCEPHALOPATHY -need for sedation -Goal RASS -2 to -3  ACUTE KIDNEY INJURY/Renal Failure Exacerbation of CKD Stage IV  -continue Foley Catheter-assess need -Avoid nephrotoxic agents -Follow urine output, BMP -Ensure adequate renal perfusion, optimize oxygenation -Renal dose medications   Intake/Output Summary (Last 24 hours) at 07/31/2021 0720 Last data filed at 07/31/2021 4709 Gross per 24 hour  Intake 2021.43 ml  Output 750 ml  Net 1271.43 ml     ENDO - ICU hypoglycemic\Hyperglycemia protocol -check FSBS per protocol   GI GI PROPHYLAXIS as indicated  NUTRITIONAL STATUS DIET-->TF's as  tolerated Constipation protocol as indicated   ELECTROLYTES -follow labs as needed -replace as needed -pharmacy consultation and following    Best Practice (right click and "Reselect all SmartList Selections" daily)  Diet/type: NPO DVT prophylaxis: SCD GI prophylaxis: PPI Lines: N/A Foley:  N/A Code Status:  full code   Labs   CBC: Recent Labs  Lab 07/30/21 1803 07/31/21 0305  WBC 11.3* 25.3*  NEUTROABS 9.3*  --   HGB 10.0* 11.1*  HCT 31.5* 35.6*  MCV 97.2 98.9  PLT 172 308     Basic Metabolic Panel: Recent Labs  Lab 07/30/21 1803 07/30/21 2336 07/31/21 0305 07/31/21 0545  NA 140  --  139  --   K 3.7  --  3.6  --   CL 107  --  106  --   CO2 27  --  24  --   GLUCOSE 195*  --  321*  --   BUN 22  --  23  --   CREATININE 1.38*  --  1.52*  --   CALCIUM 9.3  --  8.8*  --   MG  --  1.2*  --  3.0*  PHOS  --  2.3*  --  3.7    GFR: Estimated Creatinine Clearance: 32.9 mL/min (A) (by C-G formula based on SCr of 1.52 mg/dL (H)). Recent Labs  Lab 07/30/21 1803 07/30/21 2100 07/31/21 0305  PROCALCITON <0.10  --  34.44  WBC 11.3*  --  25.3*  LATICACIDVEN 2.2* 3.5* 4.1*     Liver Function Tests:  ABG    Component Value Date/Time   PHART 7.32 (L) 07/31/2021 0300   PCO2ART 42 07/31/2021 0300   PO2ART 194 (H) 07/31/2021 0300   HCO3 21.6 07/31/2021 0300   ACIDBASEDEF 4.3 (H) 07/31/2021 0300   O2SAT 99.6 07/31/2021 0300     Coagulation Profile: Recent Labs  Lab 07/30/21 2100 07/31/21 0305  INR 1.1 1.2     HbA1C: Hemoglobin A1C  Date/Time Value Ref Range Status  09/03/2012 10:30 AM 7.5 (H) 4.2 - 6.3 % Final    Comment:    The American Diabetes Association recommends that a primary goal of therapy should be <7% and that physicians should reevaluate the treatment regimen in patients with HbA1c values consistently >8%.    Hgb A1c MFr Bld  Date/Time Value Ref Range Status  07/30/2021 11:36 PM 6.7 (H) 4.8 - 5.6 % Final    Comment:     (NOTE) Pre diabetes:          5.7%-6.4%  Diabetes:              >6.4%  Glycemic control for   <7.0% adults with diabetes   10/30/2018 08:52 AM 6.0 (H) 4.8 - 5.6 % Final    Comment:    (NOTE)         Prediabetes: 5.7 - 6.4         Diabetes: >6.4         Glycemic control for adults with diabetes: <7.0     CBG: Recent Labs  Lab 07/31/21 0344  GLUCAP 217*    Allergies Allergies  Allergen Reactions   Amoxicillin Other (See Comments)    GI distress   Aspirin Diarrhea and Nausea And Vomiting   Celebrex [Celecoxib] Nausea Only   Latex Itching and Swelling   Penicillins Other (See Comments)    GI distress   Salicylates    Allegra [Fexofenadine] Cough     Home Medications  Prior to Admission medications   Medication Sig Start Date End Date Taking? Authorizing Provider  albuterol (PROVENTIL HFA;VENTOLIN HFA) 108 (90 BASE) MCG/ACT inhaler Inhale 2 puffs into the lungs every 6 (six) hours as needed for wheezing or shortness of breath.    [provider]  aspirin 81 MG tablet Take 81 mg by mouth daily. Patient not taking: Reported on 11/29/2020    [provider]  BIDIL 20-37.5 MG tablet TAKE 1 TABLET BY MOUTH THREE TIMES DAILY Patient taking differently: Take 1 tablet by mouth 3 (three) times daily. 05/20/17   Alisa Graff, FNP  Biotin 1000 MCG tablet Take 1,000 mcg by mouth daily.     [provider]  calcium carbonate (OS-CAL) 600 MG TABS tablet Take 600 mg by mouth 2 (two) times daily with a meal.     [provider]  cetirizine (ZYRTEC) 10 MG tablet Take 10 mg by mouth daily.    [provider]  cholecalciferol (VITAMIN D) 400 units TABS tablet Take 2,000 Units by mouth daily.     [provider]  [facility] Liver Oil w/Vit A & D CAPS Take 1 capsule by mouth daily.    [provider]  ENTRESTO 97-103 MG TAKE 1 TABLET BY MOUTH TWICE DAILY Patient not taking: Reported on 08/16/2020 09/18/17   Alisa Graff, FNP   fluticasone [facility]) 50 MCG/ACT nasal spray Place into the nose. 01/30/19 08/16/20  [provider]  Fluticasone-Salmeterol (ADVAIR)  500-50 MCG/DOSE AEPB Inhale 1 puff into the lungs 2 (two) times daily.    [provider]  furosemide (LASIX) 40 MG tablet Take 40 mg by mouth daily.    [provider]  glimepiride (AMARYL) 2 MG tablet Take 2 mg by mouth daily with breakfast.    [provider]  Iron-Vitamin C (VITRON-C) 65-125 MG TABS Take 1 tablet by mouth daily. 10/08/19   Earlie Server, MD  levocetirizine (XYZAL) 5 MG tablet Take 5 mg by mouth every evening.    [provider]  meloxicam (MOBIC) 7.5 MG tablet Take 7.5 mg by mouth 2 (two) times daily as needed for pain. Takes one every other day    [provider]  metFORMIN (GLUCOPHAGE) 500 MG tablet Take 1,000 mg by mouth 2 (two) times daily with a meal.    [provider]  metoprolol (TOPROL-XL) 200 MG 24 hr tablet TAKE 1 TABLET BY MOUTH ONCE DAILY. TAKE WITH OR IMMEDIATELY FOLLOWING A MEAL. Patient not taking: Reported on 11/29/2020 05/20/17   Darylene Price A, FNP  montelukast (SINGULAIR) 10 MG tablet Take 10 mg by mouth at bedtime.    [provider]  Multiple Vitamin (MULTIVITAMIN) tablet Take 1 tablet by mouth daily.     [provider]  Omega 3-6-9 Fatty Acids (TRIPLE OMEGA-3-6-9) CAPS Take 1 capsule by mouth daily.    [provider]  omeprazole (PRILOSEC) 20 MG capsule Take 20 mg by mouth 2 (two) times daily before a meal.    [provider]  ondansetron (ZOFRAN-ODT) 4 MG disintegrating tablet Take 4 mg every 8 (eight) hours as needed by mouth for nausea or vomiting.    [provider]  pantoprazole (PROTONIX) 40 MG tablet Take 40 mg by mouth 2 (two) times daily.    [provider]  potassium chloride (K-DUR) 10 MEQ tablet TAKE 1 TABLET BY MOUTH ONCE DAILY Patient not taking: Reported on 11/29/2020 09/18/17   Alisa Graff, FNP   predniSONE (DELTASONE) 10 MG tablet Take by mouth. Patient not taking: Reported on 08/16/2020 06/01/19   [provider]  rosuvastatin (CRESTOR) 20 MG tablet Take 20 mg by mouth daily.    [provider]  Semaglutide,0.25 or 0.'5MG'$ /DOS, (OZEMPIC, 0.25 OR 0.5 MG/DOSE,) 2 MG/1.5ML SOPN Inject 0.375 mLs into the skin once a week. Patient not taking: Reported on 04/08/2020 10/22/18   [provider]  Tiotropium Bromide Monohydrate 1.25 MCG/ACT AERS Inhale 2 puffs into the lungs daily.    [provider]  traMADol-acetaminophen (ULTRACET) 37.5-325 MG tablet Take 1 tablet by mouth every 6 (six) hours as needed. Takes twice a day    [provider]  traZODone (DESYREL) 100 MG tablet Take 100 mg by mouth at bedtime.    [provider]  vitamin B-12 (CYANOCOBALAMIN) 500 MCG tablet Take 500 mcg daily by mouth.    [provider]  vitamin C (ASCORBIC ACID) 500 MG tablet Take 500 mg by mouth daily.     [provider]  vitamin E 400 UNIT capsule Take 400 Units by mouth daily.    [provider]      DVT/GI PRX  assessed I Assessed the need for Labs I Assessed the need for Foley I Assessed the need for Central Venous Line Family Discussion when available I Assessed the need for Mobilization I made an Assessment of medications to be adjusted accordingly Safety Risk assessment completed  CASE DISCUSSED IN [place] ICU TEAM  Critical Care Time devoted to patient care services described in this note is 55 minutes.  Critical care was necessary to treat /prevent imminent and life-threatening deterioration. Overall, patient is critically ill, prognosis is guarded.  Patient with Multiorgan failure and at high risk for cardiac arrest and death.    Corrin Parker, M.D.  [facility]  Medical Director [place] Director [facility] Cardio-Pulmonary Department

## 2021-08-01 ENCOUNTER — Inpatient Hospital Stay: Payer: Medicare Other | Attending: Oncology

## 2021-08-01 DIAGNOSIS — B953 Streptococcus pneumoniae as the cause of diseases classified elsewhere: Secondary | ICD-10-CM | POA: Diagnosis not present

## 2021-08-01 DIAGNOSIS — G009 Bacterial meningitis, unspecified: Secondary | ICD-10-CM | POA: Diagnosis not present

## 2021-08-01 DIAGNOSIS — R4182 Altered mental status, unspecified: Secondary | ICD-10-CM | POA: Diagnosis not present

## 2021-08-01 DIAGNOSIS — G9341 Metabolic encephalopathy: Secondary | ICD-10-CM | POA: Diagnosis not present

## 2021-08-01 DIAGNOSIS — J9601 Acute respiratory failure with hypoxia: Secondary | ICD-10-CM | POA: Diagnosis not present

## 2021-08-01 LAB — PHOSPHORUS: Phosphorus: 4.4 mg/dL (ref 2.5–4.6)

## 2021-08-01 LAB — CBC
HCT: 26.8 % — ABNORMAL LOW (ref 36.0–46.0)
Hemoglobin: 8.7 g/dL — ABNORMAL LOW (ref 12.0–15.0)
MCH: 31.3 pg (ref 26.0–34.0)
MCHC: 32.5 g/dL (ref 30.0–36.0)
MCV: 96.4 fL (ref 80.0–100.0)
Platelets: 162 10*3/uL (ref 150–400)
RBC: 2.78 MIL/uL — ABNORMAL LOW (ref 3.87–5.11)
RDW: 13 % (ref 11.5–15.5)
WBC: 11.7 10*3/uL — ABNORMAL HIGH (ref 4.0–10.5)
nRBC: 0 % (ref 0.0–0.2)

## 2021-08-01 LAB — BASIC METABOLIC PANEL
Anion gap: 6 (ref 5–15)
BUN: 35 mg/dL — ABNORMAL HIGH (ref 8–23)
CO2: 25 mmol/L (ref 22–32)
Calcium: 9.1 mg/dL (ref 8.9–10.3)
Chloride: 110 mmol/L (ref 98–111)
Creatinine, Ser: 1.48 mg/dL — ABNORMAL HIGH (ref 0.44–1.00)
GFR, Estimated: 36 mL/min — ABNORMAL LOW (ref >60)
Glucose, Bld: 211 mg/dL — ABNORMAL HIGH (ref 70–99)
Potassium: 3.6 mmol/L (ref 3.5–5.1)
Sodium: 141 mmol/L (ref 135–145)

## 2021-08-01 LAB — URINE CULTURE: Culture: NO GROWTH

## 2021-08-01 LAB — MAGNESIUM: Magnesium: 2.4 mg/dL (ref 1.7–2.4)

## 2021-08-01 LAB — GLUCOSE, CAPILLARY
Glucose-Capillary: 219 mg/dL — ABNORMAL HIGH (ref 70–99)
Glucose-Capillary: 169 mg/dL — ABNORMAL HIGH (ref 70–99)
Glucose-Capillary: 150 mg/dL — ABNORMAL HIGH (ref 70–99)
Glucose-Capillary: 148 mg/dL — ABNORMAL HIGH (ref 70–99)
Glucose-Capillary: 177 mg/dL — ABNORMAL HIGH (ref 70–99)
Glucose-Capillary: 144 mg/dL — ABNORMAL HIGH (ref 70–99)

## 2021-08-01 LAB — LACTIC ACID, PLASMA: Lactic Acid, Venous: 2.2 mmol/L (ref 0.5–1.9)

## 2021-08-01 LAB — PROCALCITONIN: Procalcitonin: 40.11 ng/mL

## 2021-08-01 MED ORDER — PROPOFOL 1000 MG/100ML IV EMUL
5.0000 ug/kg/min | INTRAVENOUS | Status: DC
  Administered 2021-08-01 – 2021-08-02 (×3): 20 ug/kg/min via INTRAVENOUS
  Filled 2021-08-01 (×3): qty 100

## 2021-08-01 MED ORDER — ENOXAPARIN SODIUM 40 MG/0.4ML IJ SOSY
40.0000 mg | PREFILLED_SYRINGE | INTRAMUSCULAR | Status: DC
  Administered 2021-08-01 – 2021-08-10 (×10): 40 mg via SUBCUTANEOUS
  Filled 2021-08-01 (×10): qty 0.4

## 2021-08-01 MED ORDER — MIDAZOLAM HCL 2 MG/2ML IJ SOLN
2.0000 mg | INTRAMUSCULAR | Status: DC | PRN
  Administered 2021-08-01 – 2021-08-02 (×3): 2 mg via INTRAVENOUS
  Filled 2021-08-01 (×3): qty 2

## 2021-08-01 NOTE — Progress Notes (Signed)
Pharmacy Antibiotic Note  Diane Patton is a 76 y.o. female admitted on 07/30/2021 with meningitis. Pharmacy has been consulted for vancomycin dosing.   Today, 08/01/2021 Renal: SCr stabilized WBC 25.3 >> 11.7 Tmax last 24h 100.6 6/11 Blood cx: S pneumoniae, sensitivities pending  Plan:  Ceftriaxone 2 g IV q12h  Vancomycin 1 g IV q24h per nomogram --Goal vancomycin trough: 15-20 mcg/mL --Daily Scr per protocol --Levels at steady state or as clinically indicated  Height: 5' 5.98" (167.6 cm) Weight: 79.4 kg (175 lb 0.7 oz) IBW/kg (Calculated) : 59.26  Temp (24hrs), Avg:99.9 F (37.7 C), Min:99 F (37.2 C), Max:100.6 F (38.1 C)  Recent Labs  Lab 07/30/21 1803 07/30/21 2100 07/31/21 0305 07/31/21 1520 08/01/21 0510  WBC 11.3*  --  25.3*  --  11.7*  CREATININE 1.38*  --  1.52*  --  1.48*  LATICACIDVEN 2.2* 3.5* 4.1*  --  2.2*  VANCORANDOM  --   --   --  11  --      Estimated Creatinine Clearance: 33.8 mL/min (A) (by C-G formula based on SCr of 1.48 mg/dL (H)).    Allergies  Allergen Reactions   Amoxicillin Other (See Comments)    GI distress   Aspirin Diarrhea and Nausea And Vomiting   Celebrex [Celecoxib] Nausea Only   Latex Itching and Swelling   Penicillins Other (See Comments)    GI distress   Salicylates    Allegra [Fexofenadine] Cough    Antimicrobials this admission: 6/11 cefepime x1 6/12 acyclovir x 1 6/12 ampicillin >> 6/12  6/11 vancomycin >> 6/12 ceftriaxone >>  Dose adjustments this admission:   Microbiology results:  6/11 BCx: Pneumococcus 6/12 Sputum: NGTD 6/12 CSF: NGTD  Thank you for allowing pharmacy to be a part of this patient's care.  Diane Patton  08/01/2021 2:13 PM

## 2021-08-01 NOTE — Progress Notes (Signed)
Patient was off fentanyl for 2 hours. Attempts to follow directions by lifting arms and squeezing my hand. Placed back on sedation due to HR in 160's.

## 2021-08-01 NOTE — Progress Notes (Signed)
NAME:  Diane Patton, MRN:  [id number], DOB:  10-13-1945, LOS: 2 ADMISSION DATE:  07/30/2021, CONSULTATION DATE:  07/30/2021 REFERRING MD:  Dr. Damita Dunnings, CHIEF COMPLAINT:  Altered Mental Status   History of Present Illness/SYNOPSIS  76 yo F presenting to [facility] ED from home via EMS due to altered mental status and bloody drainage from L ear. FOUND TO HAVE SEVERE MENINGITIS LEADING TO SEVERE RESP FAILURE AND SEPTIC SHOCK STREP PNEUMONIA BACTEREMIA,   NOTED TO HAVE 3  DAYS OF LETHARGY AND HEADACHES, FATIGUE EAR PAIN USING HAIR PIN TO DIG IN HER EAR  ED course: concern regarding mentation and airway protection. Patient increasingly febrile with current TMAX 103.9. Patient's RASS - 3, with mumbling words but oxygenating and ventilating, ABG reassuring. CT head negative.  Chemistry: Na+: 140, K+: 3.7, BUN/Cr.: 22/1.38, Serum CO2/ AG: 27/6 Hematology: WBC: 11.3, Hgb: 10,  BNP: 486.9, Lactic/ PCT: 2.2 > 3.5/ <0.10,  COVID-19 & Influenza A/B: negative ABG: 7.44/ 32/ 119/ 21.7 CXR 07/30/21: Low lung volumes without evidence of acute cardiopulmonary diesase CT chest/abdomen/pelvis wo contrast 07/30/21: No acute intrathoracic, abdominal or pelvic pathology. Colonic diverticulosis. No bowel obstruction. Aortic atherosclerosis CT head wo contrast 07/30/21: No acute intracranial abnormality. Generalized cerebral atrophy with chronic Trudel matter small vessel changes  EMERGENTLY INTUBATED AND SEDATED, CVL PLACED EMERGENTLY UPON ARRIVAL TO ICU  Pertinent  Medical History  Asthma HFrEF (echo 5/23: LVEF 35-40%, mild MR & TR) LBBB CKD Stage IV NICM COPD T2DM HTN HLD Iron deficiency Anemia OSA on CPAP Scoliosis Osteoarthritis Former smoker (quit 1984) Gastric Ulcer without perforation  Significant Hospital Events: Including procedures, antibiotic start and stop dates in addition to other pertinent events   07/30/21: Admit to SDU with Sepsis d/t unknown source (suspicious for meningitis) and AMS with PCCM  consult due to Leon 6/11 patient emergently intubated, RT CVL placed  6/12 s/p LP via FLOURO CSF c/w MENINGITIS  Interim History / Subjective:  Severe septic shokc Severe resp failure Severe brain damage at this time Prognosis is guarded Family updated yesterday   Objective   Blood pressure (!) 128/45, pulse 84, temperature 99.3 F (37.4 C), resp. rate 16, height 5' 5.98" (1.676 m), weight 79.4 kg, last menstrual period 06/20/1967, SpO2 100 %.    Vent Mode: PRVC FiO2 (%):  [25 %-50 %] 35 % Set Rate:  [16 bmp] 16 bmp Vt Set:  [470 mL] 470 mL PEEP:  [5 cmH20] 5 cmH20 Plateau Pressure:  [0 cmH20-17 cmH20] 0 cmH20   Intake/Output Summary (Last 24 hours) at 08/01/2021 1308 Last data filed at 08/01/2021 0600 Gross per 24 hour  Intake 2949.86 ml  Output 930 ml  Net 2019.86 ml    Filed Weights   07/30/21 1806 07/30/21 2259  Weight: 77.1 kg 79.4 kg    REVIEW OF SYSTEMS  PATIENT IS UNABLE TO PROVIDE COMPLETE REVIEW OF SYSTEMS DUE TO SEVERE CRITICAL ILLNESS AND TOXIC METABOLIC ENCEPHALOPATHY    PHYSICAL EXAMINATION:  GENERAL:critically ill appearing, +resp distress EYES: Pupils equal, round, reactive to light.  No scleral icterus.  MOUTH: Moist mucosal membrane. INTUBATED NECK: Supple.  PULMONARY: +rhonchi, +wheezing CARDIOVASCULAR: S1 and S2.  No murmurs  GASTROINTESTINAL: Soft, nontender, -distended. Positive bowel sounds.  MUSCULOSKELETAL: No swelling, clubbing, or edema.  NEUROLOGIC: obtunded SKIN:intact,warm,dry    Assessment & Plan:  76 yo AA female with severe Sepsis with septic shock POA due to highly Suspected Meningitis with  AMS and fever with Possible Aspiration secondary to vomiting associated with  severe acidosis and severe toxic metabolic encephalopathy  STREP PNEUMONIA BACTERAMIA   Severe ACUTE Hypoxic and Hypercapnic Respiratory Failure -continue Mechanical Ventilator support -Wean Fio2 and PEEP as tolerated -VAP/VENT bundle  implementation - Wean PEEP & FiO2 as tolerated, maintain SpO2 > 88% - Head of bed elevated 30 degrees, VAP protocol in place - Plateau pressures less than 30 cm H20  - Intermittent chest x-ray & ABG PRN - Ensure adequate pulmonary hygiene  -will perform SAT/SBT when respiratory parameters are met  SEPTIC shock SOURCE-MENINGITIS, STREP PNEUMONIA BACTEREMIA -use vasopressors to keep MAP>65 as needed -follow ABG and LA as needed -aggressive IV fluid Resuscitation   NEUROLOGY ACUTE TOXIC METABOLIC ENCEPHALOPATHY DUE TO MENINGITIS -need for sedation WUA pending   ACUTE KIDNEY INJURY/Renal Failure -continue Foley Catheter-assess need -Avoid nephrotoxic agents -Follow urine output, BMP -Ensure adequate renal perfusion, optimize oxygenation -Renal dose medications   Intake/Output Summary (Last 24 hours) at 08/01/2021 0717 Last data filed at 08/01/2021 0600 Gross per 24 hour  Intake 1618.27 ml  Output 930 ml  Net 688.27 ml      Latest Ref Rng & Units 08/01/2021    5:10 AM 07/31/2021    3:05 AM 07/30/2021    6:03 PM  BMP  Glucose 70 - 99 mg/dL 211  321  195   BUN 8 - 23 mg/dL 35  23  22   Creatinine 0.44 - 1.00 mg/dL 1.48  1.52  1.38   Sodium 135 - 145 mmol/L 141  139  140   Potassium 3.5 - 5.1 mmol/L 3.6  3.6  3.7   Chloride 98 - 111 mmol/L 110  106  107   CO2 22 - 32 mmol/L _0 Calcium 8.9 - 10.3 mg/dL 9.1  8.8  9.3     ENDO - ICU hypoglycemic\Hyperglycemia protocol -check FSBS per protocol   GI GI PROPHYLAXIS as indicated  NUTRITIONAL STATUS DIET-->TF's as tolerated Constipation protocol as indicated   ELECTROLYTES -follow labs as needed -replace as needed -pharmacy consultation and following    Best Practice (right click and "Reselect all SmartList Selections" daily)  Diet/type: NPO DVT prophylaxis: SCD GI prophylaxis: PPI Lines: N/A Foley:  N/A Code Status:  full code   Labs   CBC: Recent Labs  Lab 07/30/21 1803 07/31/21 0305  08/01/21 0510  WBC 11.3* 25.3* 11.7*  NEUTROABS 9.3*  --   --   HGB 10.0* 11.1* 8.7*  HCT 31.5* 35.6* 26.8*  MCV 97.2 98.9 96.4  PLT 172 308 162     Basic Metabolic Panel: Recent Labs  Lab 07/30/21 1803 07/30/21 2336 07/31/21 0305 07/31/21 0545 08/01/21 0510  NA 140  --  139  --  141  K 3.7  --  3.6  --  3.6  CL 107  --  106  --  110  CO2 27  --  24  --  25  GLUCOSE 195*  --  321*  --  211*  BUN 22  --  23  --  35*  CREATININE 1.38*  --  1.52*  --  1.48*  CALCIUM 9.3  --  8.8*  --  9.1  MG  --  1.2*  --  3.0* 2.4  PHOS  --  2.3*  --  3.7 4.4    GFR: Estimated Creatinine Clearance: 33.8 mL/min (A) (by C-G formula based on SCr of 1.48 mg/dL (H)). Recent Labs  Lab 07/30/21 1803 07/30/21 2100 07/31/21 0305 08/01/21 0510  PROCALCITON <  0.10  --  34.44 40.11  WBC 11.3*  --  25.3* 11.7*  LATICACIDVEN 2.2* 3.5* 4.1* 2.2*     Liver Function Tests:  ABG    Component Value Date/Time   PHART 7.32 (L) 07/31/2021 0300   PCO2ART 42 07/31/2021 0300   PO2ART 194 (H) 07/31/2021 0300   HCO3 21.6 07/31/2021 0300   ACIDBASEDEF 4.3 (H) 07/31/2021 0300   O2SAT 99.6 07/31/2021 0300     Coagulation Profile: Recent Labs  Lab 07/30/21 2100 07/31/21 0305  INR 1.1 1.2     HbA1C: Hemoglobin A1C  Date/Time Value Ref Range Status  09/03/2012 10:30 AM 7.5 (H) 4.2 - 6.3 % Final    Comment:    The American Diabetes Association recommends that a primary goal of therapy should be <7% and that physicians should reevaluate the treatment regimen in patients with HbA1c values consistently >8%.    Hgb A1c MFr Bld  Date/Time Value Ref Range Status  07/30/2021 11:36 PM 6.7 (H) 4.8 - 5.6 % Final    Comment:    (NOTE) Pre diabetes:          5.7%-6.4%  Diabetes:              >6.4%  Glycemic control for   <7.0% adults with diabetes   10/30/2018 08:52 AM 6.0 (H) 4.8 - 5.6 % Final    Comment:    (NOTE)         Prediabetes: 5.7 - 6.4         Diabetes: >6.4         Glycemic  control for adults with diabetes: <7.0     CBG: Recent Labs  Lab 07/31/21 1150 07/31/21 1634 07/31/21 1940 07/31/21 2330 08/01/21 0358  GLUCAP 232* 228* 200* 192* 219*     Allergies Allergies  Allergen Reactions   Amoxicillin Other (See Comments)    GI distress   Aspirin Diarrhea and Nausea And Vomiting   Celebrex [Celecoxib] Nausea Only   Latex Itching and Swelling   Penicillins Other (See Comments)    GI distress   Salicylates    Allegra [Fexofenadine] Cough     Home Medications  Prior to Admission medications   Medication Sig Start Date End Date Taking? Authorizing Provider  albuterol (PROVENTIL HFA;VENTOLIN HFA) 108 (90 BASE) MCG/ACT inhaler Inhale 2 puffs into the lungs every 6 (six) hours as needed for wheezing or shortness of breath.    [provider]  aspirin 81 MG tablet Take 81 mg by mouth daily. Patient not taking: Reported on 11/29/2020    [provider]  BIDIL 20-37.5 MG tablet TAKE 1 TABLET BY MOUTH THREE TIMES DAILY Patient taking differently: Take 1 tablet by mouth 3 (three) times daily. 05/20/17   Alisa Graff, FNP  Biotin 1000 MCG tablet Take 1,000 mcg by mouth daily.     [provider]  calcium carbonate (OS-CAL) 600 MG TABS tablet Take 600 mg by mouth 2 (two) times daily with a meal.     [provider]  cetirizine (ZYRTEC) 10 MG tablet Take 10 mg by mouth daily.    [provider]  cholecalciferol (VITAMIN D) 400 units TABS tablet Take 2,000 Units by mouth daily.     [provider]  [facility] Liver Oil w/Vit A & D CAPS Take 1 capsule by mouth daily.    [provider]  ENTRESTO 97-103 MG TAKE 1 TABLET BY MOUTH TWICE DAILY Patient not taking: Reported on 08/16/2020 09/18/17  Hackney, Tina A, FNP  fluticasone (FLONASE) 50 MCG/ACT nasal spray Place into the nose. 01/30/19 08/16/20  [provider]  Fluticasone-Salmeterol (ADVAIR) 500-50 MCG/DOSE AEPB Inhale 1 puff into the lungs 2  (two) times daily.    [provider]  furosemide (LASIX) 40 MG tablet Take 40 mg by mouth daily.    [provider]  glimepiride (AMARYL) 2 MG tablet Take 2 mg by mouth daily with breakfast.    [provider]  Iron-Vitamin C (VITRON-C) 65-125 MG TABS Take 1 tablet by mouth daily. 10/08/19   Earlie Server, MD  levocetirizine (XYZAL) 5 MG tablet Take 5 mg by mouth every evening.    [provider]  meloxicam (MOBIC) 7.5 MG tablet Take 7.5 mg by mouth 2 (two) times daily as needed for pain. Takes one every other day    [provider]  metFORMIN (GLUCOPHAGE) 500 MG tablet Take 1,000 mg by mouth 2 (two) times daily with a meal.    [provider]  metoprolol (TOPROL-XL) 200 MG 24 hr tablet TAKE 1 TABLET BY MOUTH ONCE DAILY. TAKE WITH OR IMMEDIATELY FOLLOWING A MEAL. Patient not taking: Reported on 11/29/2020 05/20/17   Darylene Price A, FNP  montelukast (SINGULAIR) 10 MG tablet Take 10 mg by mouth at bedtime.    [provider]  Multiple Vitamin (MULTIVITAMIN) tablet Take 1 tablet by mouth daily.     [provider]  Omega 3-6-9 Fatty Acids (TRIPLE OMEGA-3-6-9) CAPS Take 1 capsule by mouth daily.    [provider]  omeprazole (PRILOSEC) 20 MG capsule Take 20 mg by mouth 2 (two) times daily before a meal.    [provider]  ondansetron (ZOFRAN-ODT) 4 MG disintegrating tablet Take 4 mg every 8 (eight) hours as needed by mouth for nausea or vomiting.    [provider]  pantoprazole (PROTONIX) 40 MG tablet Take 40 mg by mouth 2 (two) times daily.    [provider]  potassium chloride (K-DUR) 10 MEQ tablet TAKE 1 TABLET BY MOUTH ONCE DAILY Patient not taking: Reported on 11/29/2020 09/18/17   Alisa Graff, FNP  predniSONE (DELTASONE) 10 MG tablet Take by mouth. Patient not taking: Reported on 08/16/2020 06/01/19   [provider]  rosuvastatin (CRESTOR) 20 MG tablet Take 20 mg by mouth daily.     [provider]  Semaglutide,0.25 or 0.5MG /DOS, (OZEMPIC, 0.25 OR 0.5 MG/DOSE,) 2 MG/1.5ML SOPN Inject 0.375 mLs into the skin once a week. Patient not taking: Reported on 04/08/2020 10/22/18   [provider]  Tiotropium Bromide Monohydrate 1.25 MCG/ACT AERS Inhale 2 puffs into the lungs daily.    [provider]  traMADol-acetaminophen (ULTRACET) 37.5-325 MG tablet Take 1 tablet by mouth every 6 (six) hours as needed. Takes twice a day    [provider]  traZODone (DESYREL) 100 MG tablet Take 100 mg by mouth at bedtime.    [provider]  vitamin B-12 (CYANOCOBALAMIN) 500 MCG tablet Take 500 mcg daily by mouth.    [provider]  vitamin C (ASCORBIC ACID) 500 MG tablet Take 500 mg by mouth daily.     [provider]  vitamin E 400 UNIT capsule Take 400 Units by mouth daily.    [provider]       DVT/GI PRX  assessed I Assessed the need for Labs I Assessed the need for Foley I Assessed the need for Central Venous Line Family Discussion when available I Assessed the  need for Mobilization I made an Assessment of medications to be adjusted accordingly Safety Risk assessment completed  CASE DISCUSSED IN MULTIDISCIPLINARY ROUNDS WITH ICU TEAM     Critical Care Time devoted to patient care services described in this note is 45 minutes.  Critical care was necessary to treat /prevent imminent and life-threatening deterioration. Overall, patient is critically ill, prognosis is guarded.  Patient with Multiorgan failure and at high risk for cardiac arrest and death.    Corrin Parker, M.D.  [facility]  Medical Director [place] Director [facility] Cardio-Pulmonary Department

## 2021-08-01 NOTE — Progress Notes (Signed)
Date of Admission:  07/30/2021     ID: Jennifermarie Franzen is a 76 y.o. female  Principal Problem:   Sepsis ([place]tha) Active Problems:   Essential hypertension   Chronic systolic heart failure (HCC)   Diabetes mellitus, type 2 (HCC)   Chronic obstructive pulmonary disease (HCC)   Cardiomyopathy, nonischemic (HCC)   Anemia in chronic kidney disease   Stage 3b chronic kidney disease (HCC)   Acute metabolic encephalopathy   Acute respiratory failure with hypoxia (HCC)    Subjective: intubated  Medications:   chlorhexidine gluconate (MEDLINE KIT)  15 mL Mouth Rinse BID   Chlorhexidine Gluconate Cloth  6 each Topical Daily   docusate  100 mg Per Tube BID   enoxaparin (LOVENOX) injection  40 mg Subcutaneous Q24H   feeding supplement (PROSource TF)  45 mL Per Tube BID   free water  30 mL Per Tube Q4H   insulin aspart  0-20 Units Subcutaneous Q4H   insulin aspart  3 Units Subcutaneous Q4H   insulin detemir  8 Units Subcutaneous BID   ipratropium-albuterol  3 mL Nebulization Q6H   mouth rinse  15 mL Mouth Rinse 10 times per day   pantoprazole sodium  40 mg Per Tube Daily   polyethylene glycol  17 g Per Tube Daily    Objective: Vital signs in last 24 hours: Temp:  [99 F (37.2 C)-100.6 F (38.1 C)] 99.7 F (37.6 C) (06/13 1500) Pulse Rate:  [70-140] 96 (06/13 1500) Resp:  [16-19] 16 (06/13 1500) BP: (99-194)/(32-77) 158/52 (06/13 1500) SpO2:  [88 %-100 %] 100 % (06/13 1500) FiO2 (%):  [25 %-35 %] 35 % (06/13 1109)   PHYSICAL EXAM:  General: intubated  Lungs: b/la ir entry. Heart: TAchycardia Abdomen: Soft,  Extremities: atraumatic, no cyanosis. No edema. No clubbing Skin: No rashes or lesions. Or bruising Lymph: Cervical, supraclavicular normal. Neurologic: cannot be assessed  Lab Results Recent Labs    07/31/21 0305 08/01/21 0510  WBC 25.3* 11.7*  HGB 11.1* 8.7*  HCT 35.6* 26.8*  NA 139 141  K 3.6 3.6  CL 106 110  CO2 24 25  BUN 23 35*  CREATININE 1.52* 1.48*    Liver Panel Recent Labs    07/30/21 1803 07/31/21 0305  PROT 6.4* 6.4*  ALBUMIN 3.8 3.3*  AST 20 26  ALT 13 18  ALKPHOS 39 36*  BILITOT 0.9 1.1   Microbiology: 6/11 BC strep pneumo CSF WBC 9646 ( 91% N) Protein 425  Studies/Results: DG FL GUIDED LUMBAR PUNCTURE  Result Date: 07/31/2021 CLINICAL DATA:  Fever, concern for meningitis. EXAM: DIAGNOSTIC LUMBAR PUNCTURE UNDER FLUOROSCOPIC GUIDANCE COMPARISON:  Head CT 07/30/2021. CT chest/abdomen/pelvis 07/30/2021. FLUOROSCOPY TIME:  Fluoroscopy time: 6 seconds. Radiation Exposure Index (if provided by the fluoroscopic device): 6.30 mGy PROCEDURE: Prior to the procedure, Tsosie Billing, PA-C obtained informed consent from the patient's granddaughter, Jaylianna Tatlock, via telephone. This included a discussion of potential procedural risks, including but not limited to bleeding, infection, spinal cord/nerve root injury, pain, CSF leak, allergic reaction and headache. With the patient prone, the lower back was prepped with Betadine. 1% Lidocaine was used for local anesthesia. Lumbar puncture was performed at the L3-L4 level using a 20 gauge needle with return of turbid, yellow-tinged CSF with an opening pressure of 36 cm water. 15 ml of CSF was obtained for laboratory studies. Closing pressure: 16 cm of water. The inner stylet was placed back into the needle, and the needle was removed in its entirety. A  dressing was applied. No immediate post-procedure complication was apparent. The procedure was performed by Tsosie Billing PA-C, and was supervised and interpreted by Dr. Kellie Simmering. IMPRESSION: Technically successful fluoroscopically-guided L3-L4 lumbar puncture. Opening pressure: 36 cm water. 15 mL of CSF obtained for laboratory studies. Closing pressure: 16 cm water. No immediate post-procedure complication. Electronically Signed   By: Kellie Simmering D.O.   On: 07/31/2021 16:16   DG Abd 1 View  Result Date: 07/31/2021 CLINICAL DATA:  Check  endotracheal tube, enteric tube placement, right IJ central line. EXAM: ABDOMEN - 1 VIEW; PORTABLE CHEST - 1 VIEW COMPARISON:  Chest, abdomen and pelvis CT yesterday at 8:34 p.m., portable chest yesterday at 6:16 p.m. FINDINGS: Chest: Interval intubation with tip of the ETT 3.3 cm from the carina, enteric tube in place with the tip directed left abutting the proximal wall of the body of the stomach. There is a right IJ central line with its tip in the upper right atrium. No pneumothorax is seen. The heart enlarged similar to the prior study but with the interval development of small pleural effusions, diffuse interstitial consolidation consistent with edema, and patchy alveolar infiltrates throughout the lung fields most likely due to alveolar edema. No acute osseous abnormality. Abdomen: Most of the true pelvis was excluded from the exam. The visualized bowel pattern is nonobstructive. No pathologic calcification is seen apart from aortoiliac atherosclerosis. There is no supine evidence of free air. Visceral shadows are unremarkable. IMPRESSION: 1. Support devices as above. 2. Interval worsening lung status with diffuse interstitial edema, small pleural effusions and patchy opacities most likely due to alveolar edema. Query changes due to CHF or fluid overload versus ARDS. Underlying pneumonia would also be difficult to exclude. 3. No acute radiographic abdominal findings. Electronically Signed   By: Telford Nab M.D.   On: 07/31/2021 02:37   DG Chest Port 1 View  Result Date: 07/31/2021 CLINICAL DATA:  Check endotracheal tube, enteric tube placement, right IJ central line. EXAM: ABDOMEN - 1 VIEW; PORTABLE CHEST - 1 VIEW COMPARISON:  Chest, abdomen and pelvis CT yesterday at 8:34 p.m., portable chest yesterday at 6:16 p.m. FINDINGS: Chest: Interval intubation with tip of the ETT 3.3 cm from the carina, enteric tube in place with the tip directed left abutting the proximal wall of the body of the stomach.  There is a right IJ central line with its tip in the upper right atrium. No pneumothorax is seen. The heart enlarged similar to the prior study but with the interval development of small pleural effusions, diffuse interstitial consolidation consistent with edema, and patchy alveolar infiltrates throughout the lung fields most likely due to alveolar edema. No acute osseous abnormality. Abdomen: Most of the true pelvis was excluded from the exam. The visualized bowel pattern is nonobstructive. No pathologic calcification is seen apart from aortoiliac atherosclerosis. There is no supine evidence of free air. Visceral shadows are unremarkable. IMPRESSION: 1. Support devices as above. 2. Interval worsening lung status with diffuse interstitial edema, small pleural effusions and patchy opacities most likely due to alveolar edema. Query changes due to CHF or fluid overload versus ARDS. Underlying pneumonia would also be difficult to exclude. 3. No acute radiographic abdominal findings. Electronically Signed   By: Telford Nab M.D.   On: 07/31/2021 02:37   CT HEAD WO CONTRAST (5MM)  Result Date: 07/30/2021 CLINICAL DATA:  Altered mental status. EXAM: CT HEAD WITHOUT CONTRAST TECHNIQUE: Contiguous axial images were obtained from the base of the skull through the  vertex without intravenous contrast. RADIATION DOSE REDUCTION: This exam was performed according to the departmental dose-optimization program which includes automated exposure control, adjustment of the mA and/or kV according to patient size and/or use of iterative reconstruction technique. COMPARISON:  Jun 29, 2019 FINDINGS: Brain: There is mild cerebral atrophy with widening of the extra-axial spaces and ventricular dilatation. There are areas of decreased attenuation within the Gaudin matter tracts of the supratentorial brain, consistent with microvascular disease changes. Vascular: No hyperdense vessel or unexpected calcification. Skull: Normal. Negative  for fracture or focal lesion. Sinuses/Orbits: No acute finding. Other: None. IMPRESSION: 1. No acute intracranial abnormality. 2. Generalized cerebral atrophy with chronic Pelissier matter small vessel ischemic changes. Electronically Signed   By: Virgina Norfolk M.D.   On: 07/30/2021 22:57   CT CHEST ABDOMEN PELVIS WO CONTRAST  Result Date: 07/30/2021 CLINICAL DATA:  Fever of unknown origin. EXAM: CT CHEST, ABDOMEN AND PELVIS WITHOUT CONTRAST TECHNIQUE: Multidetector CT imaging of the chest, abdomen and pelvis was performed following the standard protocol without IV contrast. RADIATION DOSE REDUCTION: This exam was performed according to the departmental dose-optimization program which includes automated exposure control, adjustment of the mA and/or kV according to patient size and/or use of iterative reconstruction technique. COMPARISON:  Chest radiograph dated 07/30/2021. CT abdomen pelvis dated 10/30/2018. FINDINGS: Evaluation of this exam is limited in the absence of intravenous contrast. CT CHEST FINDINGS Cardiovascular: There is no cardiomegaly or pericardial effusion. Mild atherosclerotic calcification of the thoracic aorta. No aneurysmal dilatation. The central pulmonary arteries are grossly unremarkable. Mediastinum/Nodes: No hilar or mediastinal adenopathy. The esophagus and the thyroid gland are grossly unremarkable. No mediastinal fluid collection. Lungs/Pleura: No focal consolidation, pleural effusion, or pneumothorax. The central airways are patent. Musculoskeletal: No acute osseous pathology. CT ABDOMEN PELVIS FINDINGS No intra-abdominal free air or free fluid. Hepatobiliary: The liver is unremarkable. No intrahepatic biliary dilatation. The gallbladder is unremarkable. Pancreas: Unremarkable. No pancreatic ductal dilatation or surrounding inflammatory changes. Spleen: Normal in size without focal abnormality. Adrenals/Urinary Tract: The adrenal glands are unremarkable. There is no hydronephrosis  or nephrolithiasis on either side. Mild left renal parenchyma atrophy. The visualized ureters appear unremarkable. There is mild trabeculated appearance of the bladder wall. Stomach/Bowel: There is colonic diverticulosis without active inflammatory changes. There is no bowel obstruction or active inflammation. The appendix is not visualized with certainty. No inflammatory changes identified in the right lower quadrant. Vascular/Lymphatic: Moderate aortoiliac atherosclerotic disease. The IVC is unremarkable. No portal venous gas. There is no adenopathy. Reproductive: Hysterectomy.  No adnexal masses. Other: Midline vertical anterior pelvic wall incisional scar. Musculoskeletal: Osteopenia.  No acute osseous pathology. IMPRESSION: 1. No acute intrathoracic, abdominal, or pelvic pathology. 2. Colonic diverticulosis. No bowel obstruction. 3. Aortic Atherosclerosis (ICD10-I70.0). Electronically Signed   By: Anner Crete M.D.   On: 07/30/2021 20:56   DG Chest Port 1 View  Result Date: 07/30/2021 CLINICAL DATA:  Altered mental status. EXAM: PORTABLE CHEST 1 VIEW COMPARISON:  October 30, 2018 FINDINGS: The heart size and mediastinal contours are within normal limits. There is marked severity calcification of the aortic arch. Decreased lung volumes are seen with subsequent crowding of the bronchovascular lung markings. Mild, diffuse, chronic appearing increased lung markings are also noted. There is no evidence of focal consolidation, pleural effusion or pneumothorax. The visualized skeletal structures are unremarkable. IMPRESSION: Low lung volumes without evidence of acute or active cardiopulmonary disease. Electronically Signed   By: Virgina Norfolk M.D.   On: 07/30/2021 18:25  Assessment/Plan: Impression/Recommendation encephalopathy, fever and sepsis- due to strep pneumo meningitis. Need MRI of the brain and sinus to look for any mastoiditis /sinusitis/breach in tegmen tympani   Strep pneumo  bacteremia-  On ceftriaxone meningitis dose and vanco Awaiting susceptibility -     Acute hypoxic reps failure- intubated   ?CHF VS ARDS   AKI--stable  observe closely as on vanco  Discussed the management with her nurse and intensivist

## 2021-08-02 ENCOUNTER — Inpatient Hospital Stay: Payer: Medicare Other

## 2021-08-02 ENCOUNTER — Ambulatory Visit: Payer: Medicare Other | Admitting: Oncology

## 2021-08-02 DIAGNOSIS — G9341 Metabolic encephalopathy: Secondary | ICD-10-CM | POA: Diagnosis not present

## 2021-08-02 DIAGNOSIS — G001 Pneumococcal meningitis: Secondary | ICD-10-CM | POA: Diagnosis not present

## 2021-08-02 DIAGNOSIS — R4182 Altered mental status, unspecified: Secondary | ICD-10-CM | POA: Diagnosis not present

## 2021-08-02 DIAGNOSIS — B953 Streptococcus pneumoniae as the cause of diseases classified elsewhere: Secondary | ICD-10-CM | POA: Diagnosis not present

## 2021-08-02 DIAGNOSIS — J9601 Acute respiratory failure with hypoxia: Secondary | ICD-10-CM | POA: Diagnosis not present

## 2021-08-02 LAB — CULTURE, RESPIRATORY W GRAM STAIN
Culture: NORMAL
Gram Stain: NONE SEEN

## 2021-08-02 LAB — CBC
HCT: 24.4 % — ABNORMAL LOW (ref 36.0–46.0)
Hemoglobin: 7.6 g/dL — ABNORMAL LOW (ref 12.0–15.0)
MCH: 30.4 pg (ref 26.0–34.0)
MCHC: 31.1 g/dL (ref 30.0–36.0)
MCV: 97.6 fL (ref 80.0–100.0)
Platelets: 146 10*3/uL — ABNORMAL LOW (ref 150–400)
RBC: 2.5 MIL/uL — ABNORMAL LOW (ref 3.87–5.11)
RDW: 13.2 % (ref 11.5–15.5)
WBC: 9.3 10*3/uL (ref 4.0–10.5)
nRBC: 0 % (ref 0.0–0.2)

## 2021-08-02 LAB — TRIGLYCERIDES: Triglycerides: 153 mg/dL — ABNORMAL HIGH (ref <150)

## 2021-08-02 LAB — BASIC METABOLIC PANEL
Anion gap: 5 (ref 5–15)
BUN: 41 mg/dL — ABNORMAL HIGH (ref 8–23)
CO2: 27 mmol/L (ref 22–32)
Calcium: 9.4 mg/dL (ref 8.9–10.3)
Chloride: 112 mmol/L — ABNORMAL HIGH (ref 98–111)
Creatinine, Ser: 1.3 mg/dL — ABNORMAL HIGH (ref 0.44–1.00)
GFR, Estimated: 42 mL/min — ABNORMAL LOW (ref >60)
Glucose, Bld: 158 mg/dL — ABNORMAL HIGH (ref 70–99)
Potassium: 3.9 mmol/L (ref 3.5–5.1)
Sodium: 144 mmol/L (ref 135–145)

## 2021-08-02 LAB — CULTURE, BLOOD (ROUTINE X 2)

## 2021-08-02 LAB — MAGNESIUM: Magnesium: 2.6 mg/dL — ABNORMAL HIGH (ref 1.7–2.4)

## 2021-08-02 LAB — PHOSPHORUS: Phosphorus: 3.6 mg/dL (ref 2.5–4.6)

## 2021-08-02 LAB — PROCALCITONIN: Procalcitonin: 27.18 ng/mL

## 2021-08-02 LAB — GLUCOSE, CAPILLARY
Glucose-Capillary: 128 mg/dL — ABNORMAL HIGH (ref 70–99)
Glucose-Capillary: 131 mg/dL — ABNORMAL HIGH (ref 70–99)
Glucose-Capillary: 92 mg/dL (ref 70–99)
Glucose-Capillary: 100 mg/dL — ABNORMAL HIGH (ref 70–99)
Glucose-Capillary: 98 mg/dL (ref 70–99)
Glucose-Capillary: 115 mg/dL — ABNORMAL HIGH (ref 70–99)

## 2021-08-02 LAB — CYTOLOGY - NON PAP

## 2021-08-02 LAB — VDRL, CSF: VDRL Quant, CSF: NONREACTIVE

## 2021-08-02 MED ORDER — ALTEPLASE 2 MG IJ SOLR
2.0000 mg | Freq: Once | INTRAMUSCULAR | Status: AC
  Administered 2021-08-02: 2 mg
  Filled 2021-08-02: qty 2

## 2021-08-02 MED ORDER — INSULIN ASPART 100 UNIT/ML IJ SOLN
0.0000 [IU] | INTRAMUSCULAR | Status: DC
  Administered 2021-08-03: 2 [IU] via SUBCUTANEOUS
  Administered 2021-08-03: 1 [IU] via SUBCUTANEOUS
  Administered 2021-08-04: 3 [IU] via SUBCUTANEOUS
  Administered 2021-08-04: 1 [IU] via SUBCUTANEOUS
  Administered 2021-08-04 – 2021-08-05 (×2): 3 [IU] via SUBCUTANEOUS
  Administered 2021-08-05 (×2): 2 [IU] via SUBCUTANEOUS
  Administered 2021-08-05: 1 [IU] via SUBCUTANEOUS
  Administered 2021-08-06: 2 [IU] via SUBCUTANEOUS
  Administered 2021-08-06 (×3): 1 [IU] via SUBCUTANEOUS
  Administered 2021-08-07 (×2): 2 [IU] via SUBCUTANEOUS
  Filled 2021-08-02 (×15): qty 1

## 2021-08-02 MED ORDER — PROPOFOL 1000 MG/100ML IV EMUL
5.0000 ug/kg/min | INTRAVENOUS | Status: DC
  Administered 2021-08-02: 20 ug/kg/min via INTRAVENOUS
  Filled 2021-08-02: qty 100

## 2021-08-02 MED ORDER — HYDRALAZINE HCL 20 MG/ML IJ SOLN
10.0000 mg | INTRAMUSCULAR | Status: DC | PRN
  Administered 2021-08-02 – 2021-08-07 (×5): 10 mg via INTRAVENOUS
  Filled 2021-08-02 (×4): qty 1

## 2021-08-02 MED ORDER — DEXMEDETOMIDINE HCL IN NACL 400 MCG/100ML IV SOLN
0.4000 ug/kg/h | INTRAVENOUS | Status: DC
  Administered 2021-08-02: 1 ug/kg/h via INTRAVENOUS
  Administered 2021-08-02: 0.4 ug/kg/h via INTRAVENOUS
  Filled 2021-08-02 (×2): qty 100

## 2021-08-02 MED ORDER — ORAL CARE MOUTH RINSE: 15.0000 mL | OROMUCOSAL | Status: DC | PRN

## 2021-08-02 MED ORDER — STERILE WATER FOR INJECTION IV SOLN: INTRAVENOUS | Status: DC

## 2021-08-02 MED ORDER — ACETAMINOPHEN 10 MG/ML IV SOLN
1000.0000 mg | Freq: Once | INTRAVENOUS | Status: AC
Start: 2021-08-02 — End: 2021-08-02
  Administered 2021-08-02: 1000 mg via INTRAVENOUS
  Filled 2021-08-02: qty 100

## 2021-08-02 NOTE — Progress Notes (Signed)
NAME:  Diane Patton, MRN:  [id number], DOB:  08-16-1945, LOS: 3 ADMISSION DATE:  07/30/2021, CONSULTATION DATE:  07/30/2021 REFERRING MD:  Dr. Damita Dunnings, CHIEF COMPLAINT:  Altered Mental Status   History of Present Illness/SYNOPSIS  76 yo F presenting to [facility] ED from home via EMS due to altered mental status and bloody drainage from L ear. FOUND TO HAVE SEVERE MENINGITIS LEADING TO SEVERE RESP FAILURE AND SEPTIC SHOCK STREP PNEUMONIA BACTEREMIA,   NOTED TO HAVE 3  DAYS OF LETHARGY AND HEADACHES, FATIGUE EAR PAIN USING HAIR PIN TO DIG IN HER EAR  ED course: concern regarding mentation and airway protection. Patient increasingly febrile with current TMAX 103.9. Patient's RASS - 3, with mumbling words but oxygenating and ventilating, ABG reassuring. CT head negative.  Chemistry: Na+: 140, K+: 3.7, BUN/Cr.: 22/1.38, Serum CO2/ AG: 27/6 Hematology: WBC: 11.3, Hgb: 10,  BNP: 486.9, Lactic/ PCT: 2.2 > 3.5/ <0.10,  COVID-19 & Influenza A/B: negative ABG: 7.44/ 32/ 119/ 21.7 CXR 07/30/21: Low lung volumes without evidence of acute cardiopulmonary diesase CT chest/abdomen/pelvis wo contrast 07/30/21: No acute intrathoracic, abdominal or pelvic pathology. Colonic diverticulosis. No bowel obstruction. Aortic atherosclerosis CT head wo contrast 07/30/21: No acute intracranial abnormality. Generalized cerebral atrophy with chronic Griffo matter small vessel changes  EMERGENTLY INTUBATED AND SEDATED, CVL PLACED EMERGENTLY UPON ARRIVAL TO ICU  Pertinent  Medical History  Asthma HFrEF (echo 5/23: LVEF 35-40%, mild MR & TR) LBBB CKD Stage IV NICM COPD T2DM HTN HLD Iron deficiency Anemia OSA on CPAP Scoliosis Osteoarthritis Former smoker (quit 1984) Gastric Ulcer without perforation  Significant Hospital Events: Including procedures, antibiotic start and stop dates in addition to other pertinent events   07/30/21: Admit to SDU with Sepsis d/t unknown source (suspicious for meningitis) and AMS with PCCM  consult due to [place] 6/11 patient emergently intubated, RT CVL placed  6/12 s/p LP via FLOURO CSF c/w MENINGITIS 6/13 failure to wean from vent, severe meningitis  Interim History / Subjective:  Septic shock Weaning off pressors Remains on  vent Plan for WUA    Objective   Blood pressure (!) 151/53, pulse 83, temperature (!) 100.6 F (38.1 C), resp. rate 16, height 5' 5.98" (1.676 m), weight 79.4 kg, last menstrual period 06/20/1967, SpO2 100 %.    Vent Mode: PRVC FiO2 (%):  [25 %-35 %] 35 % Set Rate:  [16 bmp] 16 bmp Vt Set:  [470 mL] 470 mL PEEP:  [5 cmH20] 5 cmH20 Delta P (Amplitude):  [18] 18 Pressure Support:  [8 cmH20] 8 cmH20 Plateau Pressure:  [14 cmH20-15 cmH20] 14 cmH20   Intake/Output Summary (Last 24 hours) at 08/02/2021 0714 Last data filed at 08/02/2021 0503 Gross per 24 hour  Intake 1322.7 ml  Output 980 ml  Net 342.7 ml    Filed Weights   07/30/21 1806 07/30/21 2259  Weight: 77.1 kg 79.4 kg     REVIEW OF SYSTEMS  PATIENT IS UNABLE TO PROVIDE COMPLETE REVIEW OF SYSTEMS DUE TO SEVERE CRITICAL ILLNESS AND TOXIC METABOLIC ENCEPHALOPATHY    PHYSICAL EXAMINATION:  GENERAL:critically ill appearing, +resp distress EYES: Pupils equal, round, reactive to light.  No scleral icterus.  MOUTH: Moist mucosal membrane. INTUBATED NECK: Supple.  PULMONARY: +rhonchi, +wheezing CARDIOVASCULAR: S1 and S2.  No murmurs  GASTROINTESTINAL: Soft, nontender, -distended. Positive bowel sounds.  MUSCULOSKELETAL: No swelling, clubbing, or edema.  NEUROLOGIC: obtunded SKIN:intact,warm,dry     Assessment & Plan:  76 yo AA female with severe Sepsis with septic shock  POA due to highly Suspected Meningitis with  AMS and fever with Possible Aspiration secondary to vomiting associated with severe acidosis and severe toxic metabolic encephalopathy  STREP PNEUMONIA BACTERAMIA    Severe ACUTE Hypoxic and Hypercapnic Respiratory Failure -continue Mechanical  Ventilator support -Wean Fio2 and PEEP as tolerated -VAP/VENT bundle implementation - Wean PEEP & FiO2 as tolerated, maintain SpO2 > 88% - Head of bed elevated 30 degrees, VAP protocol in place - Plateau pressures less than 30 cm H20  - Intermittent chest x-ray & ABG PRN - Ensure adequate pulmonary hygiene  -will perform SAT/SBT when respiratory parameters are met and when family arrives   SEPTIC shock SOURCE-MENINGITIS, STREP PNEUMONIA BACTEREMIA -use vasopressors to keep MAP>65 as needed -follow ABG and LA as needed -aggressive IV fluid Resuscitation   NEUROLOGY ACUTE TOXIC METABOLIC ENCEPHALOPATHY DUE TO MENINGITIS Will start precedex for agitation -need for sedation WUA pending when family arrives    ACUTE KIDNEY INJURY/Renal Failure -continue Foley Catheter-assess need -Avoid nephrotoxic agents -Follow urine output, BMP -Ensure adequate renal perfusion, optimize oxygenation -Renal dose medications   Intake/Output Summary (Last 24 hours) at 08/02/2021 0717 Last data filed at 08/02/2021 0503 Gross per 24 hour  Intake 1322.7 ml  Output 980 ml  Net 342.7 ml         Latest Ref Rng & Units 08/02/2021    3:52 AM 08/01/2021    5:10 AM 07/31/2021    3:05 AM  BMP  Glucose 70 - 99 mg/dL 158  211  321   BUN 8 - 23 mg/dL 41  35  23   Creatinine 0.44 - 1.00 mg/dL 1.30  1.48  1.52   Sodium 135 - 145 mmol/L 144  141  139   Potassium 3.5 - 5.1 mmol/L 3.9  3.6  3.6   Chloride 98 - 111 mmol/L 112  110  106   CO2 22 - 32 mmol/L _0 Calcium 8.9 - 10.3 mg/dL 9.4  9.1  8.8      ENDO - ICU hypoglycemic\Hyperglycemia protocol -check FSBS per protocol   GI GI PROPHYLAXIS as indicated  NUTRITIONAL STATUS DIET-->TF's as tolerated Constipation protocol as indicated   ELECTROLYTES -follow labs as needed -replace as needed -pharmacy consultation and following  Best Practice (right click and "Reselect all SmartList Selections" daily)  Diet/type: NPO DVT  prophylaxis: SCD GI prophylaxis: PPI Lines: N/A Foley:  N/A Code Status:  full code   Labs   CBC: Recent Labs  Lab 07/30/21 1803 07/31/21 0305 08/01/21 0510 08/02/21 0352  WBC 11.3* 25.3* 11.7* 9.3  NEUTROABS 9.3*  --   --   --   HGB 10.0* 11.1* 8.7* 7.6*  HCT 31.5* 35.6* 26.8* 24.4*  MCV 97.2 98.9 96.4 97.6  PLT 172 308 162 146*     Basic Metabolic Panel: Recent Labs  Lab 07/30/21 1803 07/30/21 2336 07/31/21 0305 07/31/21 0545 08/01/21 0510 08/02/21 0352  NA 140  --  139  --  141 144  K 3.7  --  3.6  --  3.6 3.9  CL 107  --  106  --  110 112*  CO2 27  --  24  --  25 27  GLUCOSE 195*  --  321*  --  211* 158*  BUN 22  --  23  --  35* 41*  CREATININE 1.38*  --  1.52*  --  1.48* 1.30*  CALCIUM 9.3  --  8.8*  --  9.1 9.4  MG  --  1.2*  --  3.0* 2.4 2.6*  PHOS  --  2.3*  --  3.7 4.4 3.6    GFR: Estimated Creatinine Clearance: 38.5 mL/min (A) (by C-G formula based on SCr of 1.3 mg/dL (H)). Recent Labs  Lab 07/30/21 1803 07/30/21 2100 07/31/21 0305 08/01/21 0510 08/02/21 0352  PROCALCITON <0.10  --  34.44 40.11 27.18  WBC 11.3*  --  25.3* 11.7* 9.3  LATICACIDVEN 2.2* 3.5* 4.1* 2.2*  --      Liver Function Tests:  ABG    Component Value Date/Time   PHART 7.32 (L) 07/31/2021 0300   PCO2ART 42 07/31/2021 0300   PO2ART 194 (H) 07/31/2021 0300   HCO3 21.6 07/31/2021 0300   ACIDBASEDEF 4.3 (H) 07/31/2021 0300   O2SAT 99.6 07/31/2021 0300     Coagulation Profile: Recent Labs  Lab 07/30/21 2100 07/31/21 0305  INR 1.1 1.2     HbA1C: Hemoglobin A1C  Date/Time Value Ref Range Status  09/03/2012 10:30 AM 7.5 (H) 4.2 - 6.3 % Final    Comment:    The American Diabetes Association recommends that a primary goal of therapy should be <7% and that physicians should reevaluate the treatment regimen in patients with HbA1c values consistently >8%.    Hgb A1c MFr Bld  Date/Time Value Ref Range Status  07/30/2021 11:36 PM 6.7 (H) 4.8 - 5.6 % Final     Comment:    (NOTE) Pre diabetes:          5.7%-6.4%  Diabetes:              >6.4%  Glycemic control for   <7.0% adults with diabetes   10/30/2018 08:52 AM 6.0 (H) 4.8 - 5.6 % Final    Comment:    (NOTE)         Prediabetes: 5.7 - 6.4         Diabetes: >6.4         Glycemic control for adults with diabetes: <7.0     CBG: Recent Labs  Lab 08/01/21 0721 08/01/21 1111 08/01/21 1623 08/01/21 1913 08/01/21 2310  GLUCAP 169* 150* 148* 177* 144*     Allergies Allergies  Allergen Reactions   Amoxicillin Other (See Comments)    GI distress   Aspirin Diarrhea and Nausea And Vomiting   Celebrex [Celecoxib] Nausea Only   Latex Itching and Swelling   Penicillins Other (See Comments)    GI distress   Salicylates    Allegra [Fexofenadine] Cough     Home Medications  Prior to Admission medications   Medication Sig Start Date End Date Taking? Authorizing Provider  albuterol (PROVENTIL HFA;VENTOLIN HFA) 108 (90 BASE) MCG/ACT inhaler Inhale 2 puffs into the lungs every 6 (six) hours as needed for wheezing or shortness of breath.    [provider]  aspirin 81 MG tablet Take 81 mg by mouth daily. Patient not taking: Reported on 11/29/2020    [provider]  BIDIL 20-37.5 MG tablet TAKE 1 TABLET BY MOUTH THREE TIMES DAILY Patient taking differently: Take 1 tablet by mouth 3 (three) times daily. 05/20/17   Alisa Graff, FNP  Biotin 1000 MCG tablet Take 1,000 mcg by mouth daily.     [provider]  calcium carbonate (OS-CAL) 600 MG TABS tablet Take 600 mg by mouth 2 (two) times daily with a meal.     [provider]  cetirizine (ZYRTEC) 10 MG tablet Take 10 mg by mouth daily.  [provider]  cholecalciferol (VITAMIN D) 400 units TABS tablet Take 2,000 Units by mouth daily.     [provider]  [facility] Liver Oil w/Vit A & D CAPS Take 1 capsule by mouth daily.    [provider]  ENTRESTO 97-103 MG TAKE 1 TABLET BY MOUTH  TWICE DAILY Patient not taking: Reported on 08/16/2020 09/18/17   Alisa Graff, FNP  fluticasone [facility]) 50 MCG/ACT nasal spray Place into the nose. 01/30/19 08/16/20  [provider]  Fluticasone-Salmeterol (ADVAIR) 500-50 MCG/DOSE AEPB Inhale 1 puff into the lungs 2 (two) times daily.    [provider]  furosemide (LASIX) 40 MG tablet Take 40 mg by mouth daily.    [provider]  glimepiride (AMARYL) 2 MG tablet Take 2 mg by mouth daily with breakfast.    [provider]  Iron-Vitamin C (VITRON-C) 65-125 MG TABS Take 1 tablet by mouth daily. 10/08/19   Earlie Server, MD  levocetirizine (XYZAL) 5 MG tablet Take 5 mg by mouth every evening.    [provider]  meloxicam (MOBIC) 7.5 MG tablet Take 7.5 mg by mouth 2 (two) times daily as needed for pain. Takes one every other day    [provider]  metFORMIN (GLUCOPHAGE) 500 MG tablet Take 1,000 mg by mouth 2 (two) times daily with a meal.    [provider]  metoprolol (TOPROL-XL) 200 MG 24 hr tablet TAKE 1 TABLET BY MOUTH ONCE DAILY. TAKE WITH OR IMMEDIATELY FOLLOWING A MEAL. Patient not taking: Reported on 11/29/2020 05/20/17   Darylene Price A, FNP  montelukast (SINGULAIR) 10 MG tablet Take 10 mg by mouth at bedtime.    [provider]  Multiple Vitamin (MULTIVITAMIN) tablet Take 1 tablet by mouth daily.     [provider]  Omega 3-6-9 Fatty Acids (TRIPLE OMEGA-3-6-9) CAPS Take 1 capsule by mouth daily.    [provider]  omeprazole (PRILOSEC) 20 MG capsule Take 20 mg by mouth 2 (two) times daily before a meal.    [provider]  ondansetron (ZOFRAN-ODT) 4 MG disintegrating tablet Take 4 mg every 8 (eight) hours as needed by mouth for nausea or vomiting.    [provider]  pantoprazole (PROTONIX) 40 MG tablet Take 40 mg by mouth 2 (two) times daily.    [provider]  potassium chloride (K-DUR) 10 MEQ tablet TAKE 1 TABLET BY MOUTH  ONCE DAILY Patient not taking: Reported on 11/29/2020 09/18/17   Alisa Graff, FNP  predniSONE (DELTASONE) 10 MG tablet Take by mouth. Patient not taking: Reported on 08/16/2020 06/01/19   [provider]  rosuvastatin (CRESTOR) 20 MG tablet Take 20 mg by mouth daily.    [provider]  Semaglutide,0.25 or 0.5MG/DOS, (OZEMPIC, 0.25 OR 0.5 MG/DOSE,) 2 MG/1.5ML SOPN Inject 0.375 mLs into the skin once a week. Patient not taking: Reported on 04/08/2020 10/22/18   [provider]  Tiotropium Bromide Monohydrate 1.25 MCG/ACT AERS Inhale 2 puffs into the lungs daily.    [provider]  traMADol-acetaminophen (ULTRACET) 37.5-325 MG tablet Take 1 tablet by mouth every 6 (six) hours as needed. Takes twice a day    [provider]  traZODone (DESYREL) 100 MG tablet Take 100 mg by mouth at bedtime.    [provider]  vitamin B-12 (CYANOCOBALAMIN) 500 MCG tablet Take 500 mcg daily by mouth.    [provider]  vitamin C (ASCORBIC ACID) 500 MG tablet Take 500 mg  by mouth daily.     [provider]  vitamin E 400 UNIT capsule Take 400 Units by mouth daily.    [provider]       DVT/GI PRX  assessed I Assessed the need for Labs I Assessed the need for Foley I Assessed the need for Central Venous Line Family Discussion when available I Assessed the need for Mobilization I made an Assessment of medications to be adjusted accordingly Safety Risk assessment completed  CASE DISCUSSED IN MULTIDISCIPLINARY ROUNDS WITH ICU TEAM     Critical Care Time devoted to patient care services described in this note is 45 minutes.  Critical care was necessary to treat /prevent imminent and life-threatening deterioration. Overall, patient is critically ill, prognosis is guarded.  Patient with Multiorgan failure and at high risk for cardiac arrest and death.    Corrin Parker, M.D.  [facility]   Medical Director [place] Director [facility] Cardio-Pulmonary Department

## 2021-08-02 NOTE — Progress Notes (Signed)
ID Extubated this afternoon  Patient Vitals for the past 24 hrs:  BP Temp Temp src Pulse Resp SpO2 Height  08/02/21 1305 -- -- -- -- -- 98 % --  08/02/21 1234 -- -- -- -- -- 99 % --  08/02/21 1200 (!) 152/58 (!) 100.4 F (38 C) Esophageal 82 16 95 % --  08/02/21 1100 (!) 147/60 99.9 F (37.7 C) -- 77 16 96 % --  08/02/21 1000 (!) 147/55 99.7 F (37.6 C) -- 76 16 97 % --  08/02/21 0930 (!) 148/56 100 F (37.8 C) Esophageal 82 16 97 % --  08/02/21 0736 -- -- -- -- -- 99 % --  08/02/21 0730 (!) 141/53 (!) 100.4 F (38 C) Esophageal 78 16 98 % --  08/02/21 0700 (!) 160/71 (!) 100.4 F (38 C) -- (!) 102 16 97 % --  08/02/21 0600 (!) 151/53 (!) 100.6 F (38.1 C) -- 83 16 100 % --  08/02/21 0531 (!) 155/59 (!) 100.4 F (38 C) -- 89 16 99 % --  08/02/21 0500 (!) 152/56 100.2 F (37.9 C) -- 86 20 (!) 89 % --  08/02/21 0400 (!) 147/49 (!) 100.6 F (38.1 C) Esophageal 77 16 98 % --  08/02/21 0229 -- -- -- -- -- 100 % 5' 5.98" (1.676 m)  08/02/21 0000 (!) 139/50 100.2 F (37.9 C) Esophageal 84 16 99 % --  08/01/21 2330 (!) 138/49 (!) 100.4 F (38 C) -- 85 16 98 % --  08/01/21 2300 (!) 125/44 100 F (37.8 C) -- 85 16 -- --  08/01/21 2200 (!) 139/44 (!) 100.8 F (38.2 C) -- 90 15 100 % --  08/01/21 2141 -- (!) 100.8 F (38.2 C) Esophageal 92 15 100 % --  08/01/21 2130 (!) 154/49 (!) 100.8 F (38.2 C) -- 97 20 98 % --  08/01/21 2100 (!) 132/40 (!) 100.4 F (38 C) -- 82 16 98 % --  08/01/21 2005 -- -- -- -- -- 98 % 5' 5.98" (1.676 m)  08/01/21 2000 (!) 125/36 (!) 100.6 F (38.1 C) Rectal 77 16 100 % --  08/01/21 1900 (!) 119/42 100.2 F (37.9 C) -- 84 16 -- --  08/01/21 1800 (!) 128/46 100.2 F (37.9 C) -- 88 16 -- --  08/01/21 1739 -- 100.2 F (37.9 C) -- -- 18 -- --  08/01/21 1700 (!) 155/58 100 F (37.8 C) -- 100 17 -- --  08/01/21 1600 (!) 138/45 (!) 100.4 F (38 C) -- 91 16 -- --  08/01/21 1522 -- 99.9 F (37.7 C) -- -- 17 100 % --  08/01/21 1500 (!) 158/52 99.7 F  (37.6 C) -- 96 16 100 % --  08/01/21 1400 (!) 122/43 99.3 F (37.4 C) -- 85 16 100 % --  08/01/21 1344 -- 99.3 F (37.4 C) -- 83 16 100 % --  Awake, follows some commands weak  Chest b/l air entry, crepts bases  Heart tachycardia CNS cannot be assessed  Labs    Latest Ref Rng & Units 08/02/2021    3:52 AM 08/01/2021    5:10 AM 07/31/2021    3:05 AM  CBC  WBC 4.0 - 10.5 K/uL 9.3  11.7  25.3   Hemoglobin 12.0 - 15.0 g/dL 7.6  8.7  11.1   Hematocrit 36.0 - 46.0 % 24.4  26.8  35.6   Platelets 150 - 400 K/uL 146  162  308         Latest Ref  Rng & Units 08/02/2021    3:52 AM 08/01/2021    5:10 AM 07/31/2021    3:05 AM  CMP  Glucose 70 - 99 mg/dL 158  211  321   BUN 8 - 23 mg/dL 41  35  23   Creatinine 0.44 - 1.00 mg/dL 1.30  1.48  1.52   Sodium 135 - 145 mmol/L 144  141  139   Potassium 3.5 - 5.1 mmol/L 3.9  3.6  3.6   Chloride 98 - 111 mmol/L 112  110  106   CO2 22 - 32 mmol/L '27  25  24   '$ Calcium 8.9 - 10.3 mg/dL 9.4  9.1  8.8   Total Protein 6.5 - 8.1 g/dL   6.4   Total Bilirubin 0.3 - 1.2 mg/dL   1.1   Alkaline Phos 38 - 126 U/L   36   AST 15 - 41 U/L   26   ALT 0 - 44 U/L   18      Impression/recommendation 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

## 2021-08-02 NOTE — Procedures (Signed)
Extubation Procedure Note  Patient Details:   Name: Diane Patton DOB: 07/30/1945 MRN: [id number]   Airway Documentation:    Vent end date: 08/02/21 Vent end time: 1619   Evaluation  O2 sats: stable throughout Complications: No apparent complications Patient did tolerate procedure well. Bilateral Breath Sounds: Clear, Diminished   Yes.  Extubated to 4L [place].  Rayne Du Issacc Merlo 08/02/2021, 4:23 PM

## 2021-08-02 NOTE — Progress Notes (Signed)
Patient alert, following commands. Extubated to 3L via [place]. Tolerated well.

## 2021-08-03 ENCOUNTER — Inpatient Hospital Stay: Payer: Medicare Other

## 2021-08-03 DIAGNOSIS — G009 Bacterial meningitis, unspecified: Secondary | ICD-10-CM | POA: Diagnosis not present

## 2021-08-03 DIAGNOSIS — R7881 Bacteremia: Secondary | ICD-10-CM | POA: Diagnosis not present

## 2021-08-03 DIAGNOSIS — G9341 Metabolic encephalopathy: Secondary | ICD-10-CM | POA: Diagnosis not present

## 2021-08-03 DIAGNOSIS — J9601 Acute respiratory failure with hypoxia: Secondary | ICD-10-CM | POA: Diagnosis not present

## 2021-08-03 DIAGNOSIS — G001 Pneumococcal meningitis: Secondary | ICD-10-CM | POA: Diagnosis not present

## 2021-08-03 DIAGNOSIS — B953 Streptococcus pneumoniae as the cause of diseases classified elsewhere: Secondary | ICD-10-CM | POA: Diagnosis not present

## 2021-08-03 DIAGNOSIS — R4182 Altered mental status, unspecified: Secondary | ICD-10-CM | POA: Diagnosis not present

## 2021-08-03 LAB — PHOSPHORUS: Phosphorus: 3 mg/dL (ref 2.5–4.6)

## 2021-08-03 LAB — BASIC METABOLIC PANEL
Anion gap: 8 (ref 5–15)
BUN: 36 mg/dL — ABNORMAL HIGH (ref 8–23)
CO2: 26 mmol/L (ref 22–32)
Calcium: 9.4 mg/dL (ref 8.9–10.3)
Chloride: 111 mmol/L (ref 98–111)
Creatinine, Ser: 1.24 mg/dL — ABNORMAL HIGH (ref 0.44–1.00)
GFR, Estimated: 45 mL/min — ABNORMAL LOW (ref >60)
Glucose, Bld: 119 mg/dL — ABNORMAL HIGH (ref 70–99)
Potassium: 3.9 mmol/L (ref 3.5–5.1)
Sodium: 145 mmol/L (ref 135–145)

## 2021-08-03 LAB — CBC
HCT: 26.5 % — ABNORMAL LOW (ref 36.0–46.0)
Hemoglobin: 8.1 g/dL — ABNORMAL LOW (ref 12.0–15.0)
MCH: 30 pg (ref 26.0–34.0)
MCHC: 30.6 g/dL (ref 30.0–36.0)
MCV: 98.1 fL (ref 80.0–100.0)
Platelets: 184 10*3/uL (ref 150–400)
RBC: 2.7 MIL/uL — ABNORMAL LOW (ref 3.87–5.11)
RDW: 13.3 % (ref 11.5–15.5)
WBC: 9.9 10*3/uL (ref 4.0–10.5)
nRBC: 0 % (ref 0.0–0.2)

## 2021-08-03 LAB — MAGNESIUM: Magnesium: 2.1 mg/dL (ref 1.7–2.4)

## 2021-08-03 LAB — CSF CULTURE W GRAM STAIN
Culture: NO GROWTH
Gram Stain: NONE SEEN

## 2021-08-03 LAB — GLUCOSE, CAPILLARY
Glucose-Capillary: 146 mg/dL — ABNORMAL HIGH (ref 70–99)
Glucose-Capillary: 82 mg/dL (ref 70–99)
Glucose-Capillary: 121 mg/dL — ABNORMAL HIGH (ref 70–99)
Glucose-Capillary: 115 mg/dL — ABNORMAL HIGH (ref 70–99)
Glucose-Capillary: 157 mg/dL — ABNORMAL HIGH (ref 70–99)
Glucose-Capillary: 107 mg/dL — ABNORMAL HIGH (ref 70–99)

## 2021-08-03 LAB — OLIGOCLONAL BANDS, CSF + SERM

## 2021-08-03 MED ORDER — DOCUSATE SODIUM 100 MG PO CAPS
100.0000 mg | ORAL_CAPSULE | Freq: Two times a day (BID) | ORAL | Status: DC | PRN
Start: 2021-08-03 — End: 2021-08-11

## 2021-08-03 MED ORDER — IPRATROPIUM-ALBUTEROL 0.5-2.5 (3) MG/3ML IN SOLN
3.0000 mL | Freq: Three times a day (TID) | RESPIRATORY_TRACT | Status: DC
  Administered 2021-08-03 – 2021-08-04 (×5): 3 mL via RESPIRATORY_TRACT
  Filled 2021-08-03 (×5): qty 3

## 2021-08-03 MED ORDER — DOCUSATE SODIUM 100 MG PO CAPS: 100.0000 mg | ORAL_CAPSULE | Freq: Two times a day (BID) | ORAL | Status: DC

## 2021-08-03 MED ORDER — PANTOPRAZOLE SODIUM 40 MG IV SOLR
40.0000 mg | INTRAVENOUS | Status: DC
  Administered 2021-08-03 – 2021-08-06 (×4): 40 mg via INTRAVENOUS
  Filled 2021-08-03 (×4): qty 10

## 2021-08-03 MED ORDER — ADULT MULTIVITAMIN W/MINERALS CH
1.0000 | ORAL_TABLET | Freq: Every day | ORAL | Status: DC
  Administered 2021-08-06 – 2021-08-11 (×5): 1 via ORAL
  Filled 2021-08-03 (×5): qty 1

## 2021-08-03 MED ORDER — POLYETHYLENE GLYCOL 3350 17 G PO PACK
17.0000 g | PACK | Freq: Every day | ORAL | Status: DC | PRN
  Administered 2021-08-10: 17 g via ORAL
  Filled 2021-08-03: qty 1

## 2021-08-03 MED ORDER — GADOBUTROL 1 MMOL/ML IV SOLN
7.0000 mL | Freq: Once | INTRAVENOUS | Status: AC | PRN
Start: 2021-08-03 — End: 2021-08-03
  Administered 2021-08-03: 7 mL via INTRAVENOUS

## 2021-08-03 MED ORDER — POLYETHYLENE GLYCOL 3350 17 G PO PACK: 17.0000 g | PACK | Freq: Every day | ORAL | Status: DC

## 2021-08-03 MED ORDER — ENSURE ENLIVE PO LIQD
237.0000 mL | Freq: Two times a day (BID) | ORAL | Status: DC
  Administered 2021-08-04 – 2021-08-08 (×8): 237 mL via ORAL

## 2021-08-03 NOTE — Consult Note (Signed)
Neurology Consultation Reason for Consult: Acute Strep pneumo meningitis Requesting Physician: Flora Lipps  CC: Ear pain  History is obtained from: Limited history from patient, confirmed with granddaughter at bedside as well as chart review  HPI: Diane Patton is a 76 y.o. female who presented with fevers and encephalopathy with a past medical history significant for diabetes, hypertension, obstructive sleep apnea, COPD, CKD stage IIIb/IV, coronary artery disease, congestive heart failure, peripheral vascular disease.  She began to have headache and left ear pain on Friday 6/9, and was admitted 6/11 after having received Versed and Haldol in route to the ED due to agitation with EMS.  She had emesis during her head CT with concern for aspiration pneumonia, and this scan (personally reviewed) was negative for any acute intracranial process.  She was started on vancomycin, ceftriaxone, ampicillin and acyclovir for broad meningitic coverage.  She was subsequently intubated on 6/12 and lumbar puncture was obtained on 6/14.  At that time her opening pressure was found to be elevated and CSF studies were consistent with bacterial meningitis (0 reds, 9646 whites, 425 protein, 101 glucose with a serum glucose of 228), and notably this was a several days into antibiotics.  She has been narrowed to ceftriaxone based on sensitivities from blood culture.  MRI brain personally reviewed demonstrates FLAIR not on suppression in the right temporal predominantly but also bilateral frontoparietal lobes consistent with the diagnosis of bacterial meningitis.  There is also some hemorrhage in the right temporal area.  At this time the patient denies any headaches or other acute complaints.  However when pressed she does note that her hearing is worse on the left than the right, and family confirms that her hearing is much worse than her baseline  ROS: Limited given patient's encephalopathy earlier during hospitalization  with amnesia to most of the events, see pertinent as above  Past Medical History:  Diagnosis Date   Asthma    Cardiomyopathy, nonischemic (HCC)    CHF (congestive heart failure) (HCC)    COPD (chronic obstructive pulmonary disease) ([place])    Diabetes mellitus, type 2 ([place])    Gastric ulcer without hemorrhage or perforation    GERD (gastroesophageal reflux disease)    HTN (hypertension)    Hyperlipidemia    IDA (iron deficiency anemia) 04/05/2020   Obstructive sleep apnea on CPAP    Osteoarthritis    osteo of both knees   Scoliosis    Past Surgical History:  Procedure Laterality Date   ABDOMINAL HYSTERECTOMY     ankle (other)     APPENDECTOMY     BREAST BIOPSY Right 1999   neg   BREAST CYST ASPIRATION     neg   BREAST SURGERY     CARDIAC CATHETERIZATION     CLAVICLE SURGERY Right    Fx   COLONOSCOPY WITH PROPOFOL N/A 07/10/2016   Procedure: COLONOSCOPY WITH PROPOFOL;  Surgeon: Lollie Sails, MD;  Location: [facility] ENDOSCOPY;  Service: Endoscopy;  Laterality: N/A;   ear (otheR)     ESOPHAGOGASTRODUODENOSCOPY N/A 05/11/2019   Procedure: ESOPHAGOGASTRODUODENOSCOPY (EGD);  Surgeon: Toledo, Benay Pike, MD;  Location: [facility] ENDOSCOPY;  Service: Gastroenterology;  Laterality: N/A;   ESOPHAGOGASTRODUODENOSCOPY (EGD) WITH PROPOFOL N/A 07/10/2016   Procedure: ESOPHAGOGASTRODUODENOSCOPY (EGD) WITH PROPOFOL;  Surgeon: Lollie Sails, MD;  Location: [facility] ENDOSCOPY;  Service: Endoscopy;  Laterality: N/A;   ESOPHAGOGASTRODUODENOSCOPY (EGD) WITH PROPOFOL N/A 09/17/2017   Procedure: ESOPHAGOGASTRODUODENOSCOPY (EGD) WITH PROPOFOL;  Surgeon: Lollie Sails, MD;  Location: [facility] ENDOSCOPY;  Service: Endoscopy;  Laterality: N/A;   feet (other)     HERNIA REPAIR     sinus (other)     stomach     UPPER ESOPHAGEAL ENDOSCOPIC ULTRASOUND (EUS) N/A 11/22/2016   Procedure: UPPER ESOPHAGEAL ENDOSCOPIC ULTRASOUND (EUS);  Surgeon: Reita Cliche, MD;  Location: [facility] ENDOSCOPY;  Service:  Gastroenterology;  Laterality: N/A;   Current Outpatient Medications  Medication Instructions   albuterol (PROVENTIL HFA;VENTOLIN HFA) 108 (90 BASE) MCG/ACT inhaler 2 puffs, Inhalation, Every 6 hours PRN   aspirin 81 mg, Oral, Daily   BIDIL 20-37.5 MG tablet TAKE 1 TABLET BY MOUTH THREE TIMES DAILY   Biotin 1,000 mcg, Oral, Daily   busPIRone (BUSPAR) 5 mg, Oral, 3 times daily   calcium carbonate (OS-CAL) 600 mg, Oral, 2 times daily with meals   cetirizine (ZYRTEC) 10 mg, Oral, Daily PRN   cholecalciferol (VITAMIN D3) 1,000 Units, Oral, Daily   Cod Liver Oil w/Vit A & D CAPS 1 capsule, Oral, Daily   ENTRESTO 97-103 MG TAKE 1 TABLET BY MOUTH TWICE DAILY   ferrous gluconate (FERGON) 324 mg, Oral, Daily with breakfast   fluticasone (FLONASE) 50 MCG/ACT nasal spray Nasal   Fluticasone-Salmeterol (ADVAIR) 500-50 MCG/DOSE AEPB 1 puff, 2 times daily   furosemide (LASIX) 40 mg, Oral, Daily   glimepiride (AMARYL) 2 mg, Oral, Daily with breakfast   hydrALAZINE (APRESOLINE) 25 mg, Oral, 2 times daily   Iron-Vitamin C (VITRON-C) 65-125 MG TABS 1 tablet, Oral, Daily   isosorbide mononitrate (IMDUR) 30 mg, Oral, Daily   levocetirizine (XYZAL) 5 mg, Oral, Every evening   meloxicam (MOBIC) 7.5 mg, 2 times daily PRN   metFORMIN (GLUCOPHAGE) 500 mg, Oral, 2 times daily with meals   metoprolol (TOPROL-XL) 200 MG 24 hr tablet TAKE 1 TABLET BY MOUTH ONCE DAILY. TAKE WITH OR IMMEDIATELY FOLLOWING A MEAL.   metoprolol succinate (TOPROL-XL) 100 mg, Oral, Daily   montelukast (SINGULAIR) 10 mg, Oral, Daily at bedtime   Multiple Vitamin (MULTIVITAMIN) tablet 1 tablet, Oral, Daily   Omega 3-6-9 Fatty Acids (TRIPLE OMEGA-3-6-9) CAPS 1 capsule, Oral, Daily   omeprazole (PRILOSEC) 20 mg, 2 times daily before meals   ondansetron (ZOFRAN-ODT) 4 mg, Every 8 hours PRN   pantoprazole (PROTONIX) 40 mg, Oral, 2 times daily   potassium chloride (K-DUR) 10 MEQ tablet TAKE 1 TABLET BY MOUTH ONCE DAILY   predniSONE  (DELTASONE) 10 MG tablet Take by mouth.   rosuvastatin (CRESTOR) 20 mg, Oral, Daily   Semaglutide,0.25 or 0.'5MG'$ /DOS, (OZEMPIC, 0.25 OR 0.5 MG/DOSE,) 2 MG/1.5ML SOPN 0.375 mLs, Weekly   sucralfate (CARAFATE) 1 g, Oral, 2 times daily, TAKE 1 TABLET BY MOUTH TWICE DAILY BEFORE MEALS   Tiotropium Bromide Monohydrate 1.25 MCG/ACT AERS 2 puffs, Daily   traMADol-acetaminophen (ULTRACET) 37.5-325 MG tablet 1 tablet, Oral, Every 6 hours PRN, Takes twice a day   traZODone (DESYREL) 100 mg, Oral, Daily at bedtime   vitamin B-12 (CYANOCOBALAMIN) 1,000 mcg, Oral, Daily   vitamin C (ASCORBIC ACID) 500 mg, Oral, Daily     Family History  Problem Relation Age of Onset   Alzheimer's disease Other    Heart attack Mother    Hypertension Mother    Cancer Brother        lung   Cancer Maternal Aunt        mouth and breast   Breast cancer Maternal Aunt    Cancer Daughter        throat   Dementia Father  Social History:  reports that she quit smoking about 39 years ago. Her smoking use included cigarettes. She has a 15.00 pack-year smoking history. She has never used smokeless tobacco. She reports that she does not drink alcohol and does not use drugs.   Exam: Current vital signs: BP (!) 158/54   Pulse 83   Temp 99.2 F (37.3 C) (Axillary)   Resp (!) 25   Ht 5' 5.98" (1.676 m)   Wt 79.4 kg   LMP 06/20/1967 (Approximate)   SpO2 100%   BMI 28.27 kg/m  Vital signs in last 24 hours: Temp:  [99.2 F (37.3 C)-103.2 F (39.6 C)] 99.2 F (37.3 C) (06/15 0600) Pulse Rate:  [66-116] 83 (06/15 0700) Resp:  [16-28] 25 (06/15 0700) BP: (109-204)/(52-114) 158/54 (06/15 0700) SpO2:  [96 %-100 %] 100 % (06/15 1000) FiO2 (%):  [35 %] 35 % (06/14 1603)   Physical Exam  Constitutional: Appears tired but in no acute distress, leaning towards the left side Psych: Affect appropriate to situation, calm and cooperative Eyes: No scleral injection HENT: No oropharyngeal obstruction.  MSK: no joint  deformities.  Cardiovascular: Perfusing extremities well Respiratory: Somewhat short of breath at rest GI: Soft.  No distension. There is no tenderness.  Skin: Warm dry and intact visible skin  Neuro: Mental Status: Patient is awake, alert, oriented to person, place, month, year, and situation. Patient is able to give a clear and coherent history, within limits that she does not remember the early hospitalization No signs of aphasia or but possible mild left-sided neglect on motor testing Cranial Nerves: II: Visual Fields are full. Pupils are equal, round, and reactive to light.   III,IV, VI: EOMI though she does have saccadic pursuits and at times the eyes seem slightly disconjugate.  Family reports no change from baseline, and patient denies any diplopia V: Facial sensation is symmetric to light touch VII: Facial movement is symmetric.  VIII: hearing is extremely hard of hearing, much worse in the left ear than the right X: Uvula elevates symmetrically XI: Shoulder shrug is symmetric. XII: tongue is midline without atrophy or fasciculations.  Motor: Tone is normal. Bulk is normal.  She does have rapid pronator drift of the left side, and bilateral hip flexor weakness.  Confrontational strength testing is somewhat limited by her being extremely hard of hearing.  She does appear slightly weaker on the left side compared to the right particularly in the arm, but is at least 4/5 Sensory: Sensation is symmetric to light touch in the arms and legs Deep Tendon Reflexes: 3+ and symmetric in the brachioradialis and 2+ right patella, difficult to elicit left patellae.  Plantars: Toes are downgoing bilaterally.  Cerebellar: FNF and HKS are intact bilaterally Gait:  Deferred in acute setting    I have reviewed labs in epic and the results pertinent to this consultation are:  Basic Metabolic Panel: Recent Labs  Lab 07/30/21 1803 07/30/21 2336 07/31/21 0305 07/31/21 0545 08/01/21 0510  08/02/21 0352 08/03/21 0338  NA 140  --  139  --  141 144 145  K 3.7  --  3.6  --  3.6 3.9 3.9  CL 107  --  106  --  110 112* 111  CO2 27  --  24  --  '25 27 26  '$ GLUCOSE 195*  --  321*  --  211* 158* 119*  BUN 22  --  23  --  35* 41* 36*  CREATININE 1.38*  --  1.52*  --  1.48* 1.30* 1.24*  CALCIUM 9.3  --  8.8*  --  9.1 9.4 9.4  MG  --  1.2*  --  3.0* 2.4 2.6* 2.1  PHOS  --  2.3*  --  3.7 4.4 3.6 3.0    CBC: Recent Labs  Lab 07/30/21 1803 07/31/21 0305 08/01/21 0510 08/02/21 0352 08/03/21 0338  WBC 11.3* 25.3* 11.7* 9.3 9.9  NEUTROABS 9.3*  --   --   --   --   HGB 10.0* 11.1* 8.7* 7.6* 8.1*  HCT 31.5* 35.6* 26.8* 24.4* 26.5*  MCV 97.2 98.9 96.4 97.6 98.1  PLT 172 308 162 146* 184    Coagulation Studies: No results for input(s): "LABPROT", "INR" in the last 72 hours.   Please additionally see pertinent labs reviewed in HPI above  I have reviewed the images obtained:  MRI brain personally reviewed, agree with radiology: Abnormal sulcal signal in the frontoparietal lobes and the right temporal lobe above the mastoid. This likely reflects meningitis. There is also susceptibility in the right temporal region setting superimposed hemorrhage. Bilateral mastoid fluid opacification.  Impression: 76 year old doing remarkably well after meningitis.  I do suspect that her hearing loss is secondary to the meningitis and the involvement of the right temporal lobe likely explains the right arm weakness compared to the left.  However given hemorrhage as well, I would like to obtain EEG to assess for cortical irritability.  I will consider antiseizure medications at least for short-term if this is positive, most likely Keppra.  Recommendations: - Routine EEG  - Discussed briefly with infectious disease, if there is concern for dehiscence leading to this infection, ENT consultation or possibly CT temporal bone study may be helpful for better characterization of bony integrity -  Discussed with family at bedside that even if this EEG is negative the patient will be at risk of seizures in the future due to her meningitis, signs and symptoms of seizure discussed (in particular I would be concerned about left-sided twitching due to the right sided cortical involvement) - Neurology will follow-up EEG but otherwise will be available on an as-needed basis, please reach out if any questions or concerns arise  Lesleigh Noe MD-PhD [facility] [phone] [facility] coverage for [facility] is from 8 AM to 4 AM in-house and 4 PM to 8 PM by telephone/video. 8 PM to 8 AM emergent questions or overnight urgent questions should be addressed to Teleneurology On-call or [facility] neurohospitalist; contact information can be found on AMION

## 2021-08-03 NOTE — TOC Progression Note (Signed)
Transition of Care [facility]) - Progression Note    Patient Details  Name: Diane Patton MRN: [id number] Date of Birth: Jul 29, 1945  Transition of Care [facility]) CM/SW Contact  Shelbie Hutching, RN Phone Number: 08/03/2021, 8:50 AM  Clinical Narrative:    Patient extubated yesterday tolerating [place] 2L.   TOC will follow patient progress and assist with disposition, patient will need PT and OT evals.          Expected Discharge Plan and Services                                                 Social Determinants of Health (SDOH) Interventions    Readmission Risk Interventions     No data to display

## 2021-08-03 NOTE — Progress Notes (Signed)
Eeg done 

## 2021-08-03 NOTE — Evaluation (Addendum)
Clinical/Bedside Swallow Evaluation Patient Details  Name: Diane Patton MRN: [id number] Date of Birth: 1945/07/08  Today's Date: 08/03/2021 Time: SLP Start Time (ACUTE ONLY): 1200 SLP Stop Time (ACUTE ONLY): 1300 SLP Time Calculation (min) (ACUTE ONLY): 60 min  Past Medical History:  Past Medical History:  Diagnosis Date   Asthma    Cardiomyopathy, nonischemic (HCC)    CHF (congestive heart failure) (HCC)    COPD (chronic obstructive pulmonary disease) (HCC)    Diabetes mellitus, type 2 ([place])    Gastric ulcer without hemorrhage or perforation    GERD (gastroesophageal reflux disease)    HTN (hypertension)    Hyperlipidemia    IDA (iron deficiency anemia) 04/05/2020   Obstructive sleep apnea on CPAP    Osteoarthritis    osteo of both knees   Scoliosis    Past Surgical History:  Past Surgical History:  Procedure Laterality Date   ABDOMINAL HYSTERECTOMY     ankle (other)     APPENDECTOMY     BREAST BIOPSY Right 1999   neg   BREAST CYST ASPIRATION     neg   BREAST SURGERY     CARDIAC CATHETERIZATION     CLAVICLE SURGERY Right    Fx   COLONOSCOPY WITH PROPOFOL N/A 07/10/2016   Procedure: COLONOSCOPY WITH PROPOFOL;  Surgeon: Lollie Sails, MD;  Location: [facility] ENDOSCOPY;  Service: Endoscopy;  Laterality: N/A;   ear (otheR)     ESOPHAGOGASTRODUODENOSCOPY N/A 05/11/2019   Procedure: ESOPHAGOGASTRODUODENOSCOPY (EGD);  Surgeon: Toledo, Benay Pike, MD;  Location: [facility] ENDOSCOPY;  Service: Gastroenterology;  Laterality: N/A;   ESOPHAGOGASTRODUODENOSCOPY (EGD) WITH PROPOFOL N/A 07/10/2016   Procedure: ESOPHAGOGASTRODUODENOSCOPY (EGD) WITH PROPOFOL;  Surgeon: Lollie Sails, MD;  Location: [facility] ENDOSCOPY;  Service: Endoscopy;  Laterality: N/A;   ESOPHAGOGASTRODUODENOSCOPY (EGD) WITH PROPOFOL N/A 09/17/2017   Procedure: ESOPHAGOGASTRODUODENOSCOPY (EGD) WITH PROPOFOL;  Surgeon: Lollie Sails, MD;  Location: [facility] ENDOSCOPY;  Service: Endoscopy;  Laterality: N/A;   feet (other)      HERNIA REPAIR     sinus (other)     stomach     UPPER ESOPHAGEAL ENDOSCOPIC ULTRASOUND (EUS) N/A 11/22/2016   Procedure: UPPER ESOPHAGEAL ENDOSCOPIC ULTRASOUND (EUS);  Surgeon: Reita Cliche, MD;  Location: [facility] ENDOSCOPY;  Service: Gastroenterology;  Laterality: N/A;   HPI:  Pt is a 76 y/o female with h/o CHF, COPD, DM, GERD, HTN, HLD, CKD, OSA and gastric ulcer who is admitted with AMS, sepsis, bactermia, recent acute bacterial sinusitis and bronchitis, and suspected meningitis.  During admit, pt vomited while lying prone during a scan/test.   CXR on 6/12: Interval worsening lung status with diffuse interstitial edema,  small pleural effusions and patchy opacities most likely due to  alveolar edema. Query changes due to CHF or fluid overload versus ARDS. Underlying pneumonia would also be difficult to exclude.  She required oral intubation on 6/12-6/14.  MRI: Abnormal sulcal signal in the frontoparietal lobes and the right  temporal lobe above the mastoid. This likely reflects meningitis.  Head CT: Generalized cerebral atrophy with chronic Kiddy matter small  vessel ischemic changes.   She is [facility] - Neurology states suspicion that the hearing loss is secondary to the meningitis.     Assessment / Plan / Recommendation  Clinical Impression  Pt seen today for BSE. She was alert/awake and answered basic questions re: self but did not elaborate nor initiate w/ tasks. Verbal cues and model given at times. She required MOD support in positioning Upright for oral intake and  for follow through -- suspect related to current dx of Acute toxic encephalopathy: secondary to bacterial meningitis, per MD note. Pt is Edentulous at baseline.  Pt appears to present w/ adequate oropharyngeal phase swallow function w/ No oropharyngeal phase neuromuscular deficits noted. Pt consumed po trials w/ No overt, clinical s/s of aspiration during po intake. However, pt is Edentulous currently and exhibits increased exertion  w/ increased textured trials (soft solids) thus increased oral phase time and WOB.  Pt appears at min increased risk for aspiration in setting of Acute illness, but this risk can be reduced following general aspiration precautions and using a modified diet consistency w/ support feeding as needed.   During po trials, pt consumed all consistencies w/ no overt coughing, decline in vocal quality, or change in respiratory presentation during/post trials. O2 sats remained 99-100%. Oral phase appeared [facility] w/ timely bolus management and control of bolus propulsion for A-P transfer for swallowing. However, d/t Edentulous status, pt exhibited increased oral phase time for mashing/gumming of increased textured trials. This appeared to increase her WOB(mouth breathing apparent) -- RR fluctuated to the mid-upper 20s. A rest break b/t trials aided calming of RR to return to ~20bpm at rest. Noted pt's CXR Imaging in chart, medical status overall. Oral clearing achieved w/ all trial consistencies.  OM Exam appeared [facility] w/ no unilateral weakness noted. Speech clear, intelligible. Pt fed self w/ setup support -- min weak UEs.   Recommend a Pureed consistency diet w/ moistened foods for ease of oral phase and for conservation of energy at this time w/ trials to upgrade when pt's medical status is appropriate; Thin liquids. Recommend general aspiration precautions, feeding and positioning support at meals. Pills WHOLE vs CRUSHED in Puree for safer, easier swallowing. Education given on Pills in Puree; food consistencies; general aspiration precautions. ST services will f/u while admitted for trials to upgrade diet as able. MD/NSG agreed. SLP Visit Diagnosis: Dysphagia, oral phase (R13.11) (edentulous; decreased Stamina w/ increased WOB)    Aspiration Risk  Mild aspiration risk;Risk for inadequate nutrition/hydration (reduced following general precautions)    Diet Recommendation  Pureed consistency diet w/ moistened foods  for ease of oral phase and for conservation of energy at this time w/ trials to upgrade when pt's medical status is appropriate; Thin liquids. Recommend general aspiration precautions, feeding and positioning support at meals.  Medication Administration: Crushed with puree (vs WHOLE in Puree)    Other  Recommendations Recommended Consults:  (Dietician f/u) Oral Care Recommendations: Oral care BID;Oral care before and after PO;Staff/trained caregiver to provide oral care Other Recommendations:  (n/a)    Recommendations for follow up therapy are one component of a multi-disciplinary discharge planning process, led by the attending physician.  Recommendations may be updated based on patient status, additional functional criteria and insurance authorization.  Follow up Recommendations No SLP follow up (TBD)      Assistance Recommended at Discharge Intermittent Supervision/Assistance  Functional Status Assessment Patient has had a recent decline in their functional status and demonstrates the ability to make significant improvements in function in a reasonable and predictable amount of time.  Frequency and Duration min 2x/week  1 week       Prognosis Prognosis for Safe Diet Advancement: Fair (-Good) Barriers to Reach Goals: Time post onset;Severity of deficits      Swallow Study   General Date of Onset: 07/30/21 HPI: Pt is a 76 y/o female with h/o CHF, COPD, DM, GERD, HTN, HLD, CKD, OSA and gastric ulcer  who is admitted with AMS, sepsis, bactermia, recent acute bacterial sinusitis and bronchitis, and suspected meningitis.  During admit, pt vomited while lying prone during a scan/test.   CXR on 6/12: Interval worsening lung status with diffuse interstitial edema,  small pleural effusions and patchy opacities most likely due to  alveolar edema. Query changes due to CHF or fluid overload versus  ARDS. Underlying pneumonia would also be difficult to exclude.  She required oral intubation on  6/12-6/14.  MRI: Abnormal sulcal signal in the frontoparietal lobes and the right  temporal lobe above the mastoid. This likely reflects meningitis.  Head CT: Generalized cerebral atrophy with chronic [place] matter small  vessel ischemic changes.  She is HOH. Type of Study: Bedside Swallow Evaluation Previous Swallow Assessment: none Diet Prior to this Study: NPO Temperature Spikes Noted: No (wbc 9.9; recent brief spike in temp in last 24 hours) Respiratory Status: Nasal cannula (2L) History of Recent Intubation: Yes Length of Intubations (days): 2 days Date extubated: 08/02/21 Behavior/Cognition: Alert;Cooperative;Pleasant mood;Requires cueing (HOH) Oral Cavity Assessment: Within Functional Limits Oral Care Completed by SLP: Yes Oral Cavity - Dentition: Edentulous Vision: Functional for self-feeding Self-Feeding Abilities: Able to feed self;Needs assist;Needs set up (weak UEs) Patient Positioning: Upright in bed (needed positioning support) Baseline Vocal Quality: Normal Volitional Cough: Strong Volitional Swallow: Able to elicit    Oral/Motor/Sensory Function Overall Oral Motor/Sensory Function: Within functional limits   Ice Chips Ice chips: Within functional limits Presentation: Spoon (fed; 2 trials)   Thin Liquid Thin Liquid: Within functional limits Presentation: Straw;Self Fed (~10 ozs total) Other Comments: water, juice    Nectar Thick Nectar Thick Liquid: Not tested   Honey Thick Honey Thick Liquid: Not tested   Puree Puree: Within functional limits Presentation: Self Fed;Spoon (supported; ~3-4 ozs)   Solid     Solid: Impaired (edentulous) Presentation: Self Fed;Spoon (supported; 4 trials) Oral Phase Impairments: Impaired mastication Oral Phase Functional Implications: Impaired mastication;Prolonged oral transit Pharyngeal Phase Impairments: Change in Vital Signs (min increased WOB) Other Comments: exertion increased WOB -- rest breaks were supportive in calming  breathing         Orinda Kenner, MS, CCC-SLP Speech Language Pathologist Rehab Services; [place] [phone] (ascom) Ica Daye 08/03/2021,3:17 PM

## 2021-08-03 NOTE — Evaluation (Addendum)
Physical Therapy Evaluation Patient Details Name: Diane Patton MRN: [id number] DOB: 1945-03-28 Today's Date: 08/03/2021  History of Present Illness  Diane Patton is a 57yoF who comes to [facility] on 07/30/21 with AMS at home. Pt admitted in sepsis of unknown origin. PMH: sCHF, NICM, NIDDM2, HTN, COPD, CKD3b, anemia, OSA. Pt required mechanical ventilation through 6/14, extuabted to 4L. ID following for bacterial meningitis.  Clinical Impression  Pt admitted with above diagnosis. Pt currently with functional limitations due to the deficits listed below (see "PT Problem List"). Upon entry, pt in bed, awake and somewhat interactive but largely hypoactive and flat of affect. Pt is profoundly HOH which complicates obtaining a more focal mentation assessment, but pt struggles to follow most simple commands. Pt unable to give much background info. Pt struggles to follow commands for strength assessment in bed, but what is done shows severe weakness globally. Patient's performance this date reveals decreased ability, independence, and tolerance in performing all basic mobility required for performance of activities of daily living. Pt requires additional DME, close physical assistance, and cues for safe participate in mobility. Pt will benefit from skilled PT intervention to increase independence and safety with basic mobility in preparation for discharge to the venue listed below.           Recommendations for follow up therapy are one component of a multi-disciplinary discharge planning process, led by the attending physician.  Recommendations may be updated based on patient status, additional functional criteria and insurance authorization.  Follow Up Recommendations Skilled nursing-short term rehab (<3 hours/day)    Assistance Recommended at Discharge Frequent or constant Supervision/Assistance  Patient can return home with the following  Two people to help with bathing/dressing/bathroom;Two people to help  with walking and/or transfers;Help with stairs or ramp for entrance    Equipment Recommendations None recommended by PT  Recommendations for Other Services       Functional Status Assessment Patient has had a recent decline in their functional status and demonstrates the ability to make significant improvements in function in a reasonable and predictable amount of time.     Precautions / Restrictions Precautions Precautions: Fall Restrictions Weight Bearing Restrictions: No      Mobility  Bed Mobility Overal bed mobility: Needs Assistance Bed Mobility: Rolling, Supine to Sit Rolling: Min assist   Supine to sit: Mod assist     General bed mobility comments: deffer much mobility due to flexiseal, AMS, and elevated BP    Transfers                        Ambulation/Gait                  Stairs            Wheelchair Mobility    Modified Rankin (Stroke Patients Only)       Balance                                             Pertinent Vitals/Pain Pain Assessment Pain Assessment: No/denies pain    Home Living Family/patient expects to be discharged to:: Private residence Living Arrangements: Spouse/significant other;Children (husband and son; husband is an amputee and does not drive) Available Help at Discharge: Family             Home Equipment: Kasandra Knudsen - single point Additional Comments: needs  corroboration due to AMS    Prior Function Prior Level of Function : Driving                     Hand Dominance        Extremity/Trunk Assessment   Upper Extremity Assessment Upper Extremity Assessment: Generalized weakness    Lower Extremity Assessment Lower Extremity Assessment: Generalized weakness       Communication      Cognition Arousal/Alertness: Awake/alert Behavior During Therapy: Flat affect Overall Cognitive Status: Difficult to assess                                  General Comments: not following simple commands consistently        General Comments      Exercises     Assessment/Plan    PT Assessment Patient needs continued PT services  PT Problem List Decreased strength;Decreased range of motion;Decreased activity tolerance;Decreased balance;Decreased mobility;Decreased cognition;Decreased safety awareness;Decreased knowledge of precautions;Decreased knowledge of use of DME       PT Treatment Interventions DME instruction;Balance training;Gait training;Stair training;Functional mobility training;Therapeutic activities;Therapeutic exercise;Patient/family education    PT Goals (Current goals can be found in the Care Plan section)  Acute Rehab PT Goals PT Goal Formulation: Patient unable to participate in goal setting    Frequency Min 2X/week     Co-evaluation               AM-PAC PT "6 Clicks" Mobility  Outcome Measure Help needed turning from your back to your side while in a flat bed without using bedrails?: Total Help needed moving from lying on your back to sitting on the side of a flat bed without using bedrails?: Total Help needed moving to and from a bed to a chair (including a wheelchair)?: Total Help needed standing up from a chair using your arms (e.g., wheelchair or bedside chair)?: Total Help needed to walk in hospital room?: Total Help needed climbing 3-5 steps with a railing? : Total 6 Click Score: 6    End of Session   Activity Tolerance: Patient tolerated treatment well;No increased pain;Treatment limited secondary to medical complications (Comment) Patient left: in bed;with call bell/phone within reach;with bed alarm set Nurse Communication: Mobility status PT Visit Diagnosis: Difficulty in walking, not elsewhere classified (R26.2);Other abnormalities of gait and mobility (R26.89);Muscle weakness (generalized) (M62.81)    Time: 1250-1305 PT Time Calculation (min) (ACUTE ONLY): 15 min   Charges:   PT  Evaluation $PT Eval High Complexity: 1 High         3:59 PM, 08/03/21 Etta Grandchild, PT, DPT Physical Therapist - [facility]  [phone] ([place])   [place] C 08/03/2021, 3:56 PM

## 2021-08-03 NOTE — Progress Notes (Signed)
Nutrition Follow Up Note   DOCUMENTATION CODES:   Not applicable  INTERVENTION:   Ensure Enlive po BID, each supplement provides 350 kcal and 20 grams of protein.  Magic cup TID with meals, each supplement provides 290 kcal and 9 grams of protein  MVI po daily   NUTRITION DIAGNOSIS:   Inadequate oral intake related to inability to eat (pt sedated and ventilated) as evidenced by NPO status.  GOAL:   Patient will meet greater than or equal to 90% of their needs -previously met with tube feeds   MONITOR:   PO intake, Supplement acceptance, Labs, Weight trends, Skin, I & O's  ASSESSMENT:   76 y/o female with h/o CHF, COPD, DM, GERD, HTN, HLD, CKD, OSA and gastric ulcer who is admitted with AMS, sepsis, bactermia and suspected meningitis.  Visited pt's room today. Pt is very HOH. Pt extubated 6/14. Pt seen by SLP today and initiated on a pureed diet. RD will add supplements and MVI to help pt meet her estimated needs. No new weight since admit; will request daily weights. Pt +2.1L on her I & Os.   Medications reviewed and include: lovenox, insulin, protonix, ceftriaxone  Labs reviewed: K 3.9 wnl, BUN 36(H),  creat 1.24(H), P 3.0 wnl, Mg 2.1 wnl Hgb 8.1(L), Hct 26.5(L) Cbgs- 115, 121, 82 x 24 hrs  Diet Order:   Diet Order             DIET - DYS 1 Room service appropriate? Yes with Assist; Fluid consistency: Thin  Diet effective now                  EDUCATION NEEDS:   No education needs have been identified at this time  Skin:  Skin Assessment: Reviewed RN Assessment (ecchymosis)  Last BM:  6/15- TYPE 7  Height:   Ht Readings from Last 1 Encounters:  08/02/21 5' 5.98" (1.676 m)    Weight:   Wt Readings from Last 1 Encounters:  07/30/21 79.4 kg    Ideal Body Weight:  59 kg  BMI:  Body mass index is 28.27 kg/m.  Estimated Nutritional Needs:   Kcal:  1700-1900kcal/day  Protein:  85-95g/day  Fluid:  1.5-1.7L/day  Koleen Distance MS, RD,  LDN Please refer to [facility] for RD and/or RD on-call/weekend/after hours pager

## 2021-08-03 NOTE — Procedures (Signed)
Patient Name: Diane Patton  MRN: [id number]  Epilepsy Attending: Lora Havens  Referring Physician/Provider: Lorenza Chick, MD  Date: 08/03/2021 Duration: 24.33 mins  Patient history: 76 year old doing remarkably well after meningitis.  I do suspect that her hearing loss is secondary to the meningitis and the involvement of the right temporal lobe likely explains the right arm weakness compared to the left.  However given hemorrhage as well, EEG to evaluate for seizure.   Level of alertness: Awake  AEDs during EEG study: None  Technical aspects: This EEG study was done with scalp electrodes positioned according to the 10-20 International system of electrode placement. Electrical activity was acquired at a sampling rate of '500Hz'$  and reviewed with a high frequency filter of '70Hz'$  and a low frequency filter of '1Hz'$ . EEG data were recorded continuously and digitally stored.   Description: The posterior dominant rhythm consists of 8 Hz activity of moderate voltage (25-35 uV) seen predominantly in posterior head regions, symmetric and reactive to eye opening and eye closing. Drowsiness was characterized by attenuation of the posterior background rhythm. Hyperventilation and photic stimulation were not performed.     IMPRESSION: This study is within normal limits. No seizures or epileptiform discharges were seen throughout the recording.  Diane Patton [name]

## 2021-08-03 NOTE — Progress Notes (Signed)
ID Pt is more awake and responding to questions Hard of hearing [place] for fever of 103 yesterday Had MRI without contrast today  Oe/ awake  Follows commands BP 106/89   Pulse (!) 103   Temp (!) 100.7 F (38.2 C) (Oral)   Resp 20   Ht 5' 5.98" (1.676 m)   Wt 79.4 kg   LMP 06/20/1967 (Approximate)   SpO2 100%   BMI 28.27 kg/m   Chest b/l air entry Crepts bases HS tachycardia Abd soft, no tenderness or pain Skin no lesions Rectal tube- liquid stools CNS moves all extremities   Labs    Latest Ref Rng & Units 08/03/2021    3:38 AM 08/02/2021    3:52 AM 08/01/2021    5:10 AM  CBC  WBC 4.0 - 10.5 K/uL 9.9  9.3  11.7   Hemoglobin 12.0 - 15.0 g/dL 8.1  7.6  8.7   Hematocrit 36.0 - 46.0 % 26.5  24.4  26.8   Platelets 150 - 400 K/uL 184  146  162        Latest Ref Rng & Units 08/03/2021    3:38 AM 08/02/2021    3:52 AM 08/01/2021    5:10 AM  CMP  Glucose 70 - 99 mg/dL 119  158  211   BUN 8 - 23 mg/dL 36  41  35   Creatinine 0.44 - 1.00 mg/dL 1.24  1.30  1.48   Sodium 135 - 145 mmol/L 145  144  141   Potassium 3.5 - 5.1 mmol/L 3.9  3.9  3.6   Chloride 98 - 111 mmol/L 111  112  110   CO2 22 - 32 mmol/L '26  27  25   '$ Calcium 8.9 - 10.3 mg/dL 9.4  9.4  9.1     Micro 07/30/21 BC - strep pneumo 08/02/21- BC NG so far Csf culture NG Csf cell count- 9646 frank pus Protein 425   Impression /recommendation Strep pneumoniae bacteremia with purulent meningitis Eventhough csf culture was neg this is meningitis due to strep pneum Main concern is to r/o dehiscence of tegmen tympani. Mastoid breach causing direct spilling into csf Pt had MRI without contrast and it showed Hyperintensity in the rt temporal lobe including above mastoid Need MRI with contrast to delineate this lesion and to make sure there is no abscess or dehiscence as patient continues to have fever eventhough wbc is normal  Fever- r/o cerebral abscess, dehiscence of tegmen tympani, mastoiditis Recommend ENT  consult to assess for any possible drainage of collections  Pt is on IV ceftriaxone 2 grams Q 12  day 5- may need upto 14-21 days if she  has sinusitis, mastoiditis with intracranial extension Seen by neuro    AKI- improving  Discussed the management with care team [facility] covering by phone tomorrow and the weekend. Call if needed

## 2021-08-03 NOTE — Progress Notes (Signed)
NAME:  Diane Patton, MRN:  [id number], DOB:  02/02/1946, LOS: 4 ADMISSION DATE:  07/30/2021, CONSULTATION DATE:  07/30/2021 REFERRING MD:  Dr. Damita Dunnings, CHIEF COMPLAINT:  Altered Mental Status   History of Present Illness/SYNOPSIS  76 yo F presenting to [facility] ED from home via EMS due to altered mental status and bloody drainage from L ear. FOUND TO HAVE SEVERE MENINGITIS LEADING TO SEVERE RESP FAILURE AND SEPTIC SHOCK STREP PNEUMONIA BACTEREMIA,   NOTED TO HAVE 3  DAYS OF LETHARGY AND HEADACHES, FATIGUE EAR PAIN USING HAIR PIN TO DIG IN HER EAR  ED course: concern regarding mentation and airway protection. Patient increasingly febrile with current TMAX 103.9. Patient's RASS - 3, with mumbling words but oxygenating and ventilating, ABG reassuring. CT head negative.  Chemistry: Na+: 140, K+: 3.7, BUN/Cr.: 22/1.38, Serum CO2/ AG: 27/6 Hematology: WBC: 11.3, Hgb: 10,  BNP: 486.9, Lactic/ PCT: 2.2 > 3.5/ <0.10,  COVID-19 & Influenza A/B: negative ABG: 7.44/ 32/ 119/ 21.7 CXR 07/30/21: Low lung volumes without evidence of acute cardiopulmonary diesase CT chest/abdomen/pelvis wo contrast 07/30/21: No acute intrathoracic, abdominal or pelvic pathology. Colonic diverticulosis. No bowel obstruction. Aortic atherosclerosis CT head wo contrast 07/30/21: No acute intracranial abnormality. Generalized cerebral atrophy with chronic [place] matter small vessel changes  EMERGENTLY INTUBATED AND SEDATED, CVL PLACED EMERGENTLY UPON ARRIVAL TO ICU  Pertinent  Medical History  Asthma HFrEF (echo 5/23: LVEF 35-40%, mild MR & TR) LBBB CKD Stage IV NICM COPD T2DM HTN HLD Iron deficiency Anemia OSA on CPAP Scoliosis Osteoarthritis Former smoker (quit 1984) Gastric Ulcer without perforation  Significant Hospital Events: Including procedures, antibiotic start and stop dates in addition to other pertinent events   07/30/21: Admit to SDU with Sepsis d/t unknown source (suspicious for meningitis) and AMS with PCCM  consult due to [place] 6/11 patient emergently intubated, RT CVL placed  6/12 s/p LP via FLOURO CSF c/w MENINGITIS 6/13 failure to wean from vent, severe meningitis 6/14 extubated to [place]  Interim History / Subjective:  Lethargic but arousable No distress Febrile yesterday given tylenol Off pressors Minimal oxygen   Objective   Blood pressure (!) 158/54, pulse 83, temperature 99.2 F (37.3 C), temperature source Axillary, resp. rate (!) 25, height 5' 5.98" (1.676 m), weight 79.4 kg, last menstrual period 06/20/1967, SpO2 100 %.    Vent Mode: PSV;CPAP FiO2 (%):  [35 %] 35 % Set Rate:  [16 bmp] 16 bmp Vt Set:  [470 mL] 470 mL PEEP:  [5 cmH20] 5 cmH20 Pressure Support:  [5 cmH20] 5 cmH20 Plateau Pressure:  [17 cmH20] 17 cmH20   Intake/Output Summary (Last 24 hours) at 08/03/2021 0743 Last data filed at 08/03/2021 0400 Gross per 24 hour  Intake 1265.29 ml  Output 1702 ml  Net -436.71 ml    Filed Weights   07/30/21 1806 07/30/21 2259  Weight: 77.1 kg 79.4 kg    Review of Systems: No pain Other:  All other systems negative    PHYSICAL EXAMINATION:  GENERAL:critically ill appearing,  EYES: Pupils equal, round, reactive to light.  No scleral icterus.  NECK: Supple.  PULMONARY: +rhonchi,  CARDIOVASCULAR: S1 and S2.  No murmurs  GASTROINTESTINAL: Soft, nontender, -distended. Positive bowel sounds.  MUSCULOSKELETAL:  edema.  NEUROLOGIC: lethargic SKIN:intact,warm,dry     Assessment & Plan:  76 yo AA female with severe Sepsis with septic shock POA due to highly Suspected Meningitis with  AMS and fever with Possible Aspiration secondary to vomiting associated with severe acidosis and  severe toxic metabolic encephalopathy  STREP PNEUMONIA BACTERAMIA   Severe ACUTE Hypoxic and Hypercapnic Respiratory Failure REOLVED Oxygen as needed PT/OT IS to bedside   SEPTIC shock SOURCE-MENINGITIS, STREP PNEUMONIA BACTEREMIA Off pressors  now   NEUROLOGY ADMITTED FOR ACUTE TOXIC METABOLIC ENCEPHALOPATHY DUE TO MENINGITIS NEURO STATUS IMPROVING Avoid sedatives  ACUTE KIDNEY INJURY/Renal Failure -continue Foley Catheter-assess need -Avoid nephrotoxic agents -Follow urine output, BMP -Ensure adequate renal perfusion, optimize oxygenation -Renal dose medications   Intake/Output Summary (Last 24 hours) at 08/03/2021 0746 Last data filed at 08/03/2021 0400 Gross per 24 hour  Intake 1265.29 ml  Output 1702 ml  Net -436.71 ml      Latest Ref Rng & Units 08/03/2021    3:38 AM 08/02/2021    3:52 AM 08/01/2021    5:10 AM  BMP  Glucose 70 - 99 mg/dL 119  158  211   BUN 8 - 23 mg/dL 36  41  35   Creatinine 0.44 - 1.00 mg/dL 1.24  1.30  1.48   Sodium 135 - 145 mmol/L 145  144  141   Potassium 3.5 - 5.1 mmol/L 3.9  3.9  3.6   Chloride 98 - 111 mmol/L 111  112  110   CO2 22 - 32 mmol/L '26  27  25   '$ Calcium 8.9 - 10.3 mg/dL 9.4  9.4  9.1      ENDO - ICU hypoglycemic\Hyperglycemia protocol -check FSBS per protocol   GI GI PROPHYLAXIS as indicated  NUTRITIONAL STATUS DIET-->SPEECH EVAL PENDING Constipation protocol as indicated   ELECTROLYTES -follow labs as needed -replace as needed -pharmacy consultation and following   Best Practice (right click and "Reselect all SmartList Selections" daily)  Diet/type: NPO DVT prophylaxis: SCD GI prophylaxis: PPI Lines: N/A Foley:  N/A Code Status:  full code   Labs   CBC: Recent Labs  Lab 07/30/21 1803 07/31/21 0305 08/01/21 0510 08/02/21 0352 08/03/21 0338  WBC 11.3* 25.3* 11.7* 9.3 9.9  NEUTROABS 9.3*  --   --   --   --   HGB 10.0* 11.1* 8.7* 7.6* 8.1*  HCT 31.5* 35.6* 26.8* 24.4* 26.5*  MCV 97.2 98.9 96.4 97.6 98.1  PLT 172 308 162 146* 184     Basic Metabolic Panel: Recent Labs  Lab 07/30/21 1803 07/30/21 2336 07/31/21 0305 07/31/21 0545 08/01/21 0510 08/02/21 0352 08/03/21 0338  NA 140  --  139  --  141 144 145  K 3.7  --  3.6  --  3.6  3.9 3.9  CL 107  --  106  --  110 112* 111  CO2 27  --  24  --  '25 27 26  '$ GLUCOSE 195*  --  321*  --  211* 158* 119*  BUN 22  --  23  --  35* 41* 36*  CREATININE 1.38*  --  1.52*  --  1.48* 1.30* 1.24*  CALCIUM 9.3  --  8.8*  --  9.1 9.4 9.4  MG  --  1.2*  --  3.0* 2.4 2.6* 2.1  PHOS  --  2.3*  --  3.7 4.4 3.6 3.0    GFR: Estimated Creatinine Clearance: 40.4 mL/min (A) (by C-G formula based on SCr of 1.24 mg/dL (H)). Recent Labs  Lab 07/30/21 1803 07/30/21 2100 07/31/21 0305 08/01/21 0510 08/02/21 0352 08/03/21 0338  PROCALCITON <0.10  --  34.44 40.11 27.18  --   WBC 11.3*  --  25.3* 11.7* 9.3 9.9  LATICACIDVEN 2.2* 3.5*  4.1* 2.2*  --   --      Liver Function Tests:  ABG    Component Value Date/Time   PHART 7.32 (L) 07/31/2021 0300   PCO2ART 42 07/31/2021 0300   PO2ART 194 (H) 07/31/2021 0300   HCO3 21.6 07/31/2021 0300   ACIDBASEDEF 4.3 (H) 07/31/2021 0300   O2SAT 99.6 07/31/2021 0300     Coagulation Profile: Recent Labs  Lab 07/30/21 2100 07/31/21 0305  INR 1.1 1.2     HbA1C: Hemoglobin A1C  Date/Time Value Ref Range Status  09/03/2012 10:30 AM 7.5 (H) 4.2 - 6.3 % Final    Comment:    The American Diabetes Association recommends that a primary goal of therapy should be <7% and that physicians should reevaluate the treatment regimen in patients with HbA1c values consistently >8%.    Hgb A1c MFr Bld  Date/Time Value Ref Range Status  07/30/2021 11:36 PM 6.7 (H) 4.8 - 5.6 % Final    Comment:    (NOTE) Pre diabetes:          5.7%-6.4%  Diabetes:              >6.4%  Glycemic control for   <7.0% adults with diabetes   10/30/2018 08:52 AM 6.0 (H) 4.8 - 5.6 % Final    Comment:    (NOTE)         Prediabetes: 5.7 - 6.4         Diabetes: >6.4         Glycemic control for adults with diabetes: <7.0     CBG: Recent Labs  Lab 08/02/21 1852 08/02/21 1912 08/02/21 2306 08/03/21 0307 08/03/21 0710  GLUCAP 100* 98 115* 82 121*      Allergies Allergies  Allergen Reactions   Amoxicillin Other (See Comments)    GI distress   Aspirin Diarrhea and Nausea And Vomiting   Celebrex [Celecoxib] Nausea Only   Latex Itching and Swelling   Penicillins Other (See Comments)    GI distress   Salicylates    Allegra [Fexofenadine] Cough     Home Medications  Prior to Admission medications   Medication Sig Start Date End Date Taking? Authorizing Provider  albuterol (PROVENTIL HFA;VENTOLIN HFA) 108 (90 BASE) MCG/ACT inhaler Inhale 2 puffs into the lungs every 6 (six) hours as needed for wheezing or shortness of breath.    [provider]  aspirin 81 MG tablet Take 81 mg by mouth daily. Patient not taking: Reported on 11/29/2020    [provider]  BIDIL 20-37.5 MG tablet TAKE 1 TABLET BY MOUTH THREE TIMES DAILY Patient taking differently: Take 1 tablet by mouth 3 (three) times daily. 05/20/17   Alisa Graff, FNP  Biotin 1000 MCG tablet Take 1,000 mcg by mouth daily.     [provider]  calcium carbonate (OS-CAL) 600 MG TABS tablet Take 600 mg by mouth 2 (two) times daily with a meal.     [provider]  cetirizine (ZYRTEC) 10 MG tablet Take 10 mg by mouth daily.    [provider]  cholecalciferol (VITAMIN D) 400 units TABS tablet Take 2,000 Units by mouth daily.     [provider]  [facility] Liver Oil w/Vit A & D CAPS Take 1 capsule by mouth daily.    [provider]  ENTRESTO 97-103 MG TAKE 1 TABLET BY MOUTH TWICE DAILY Patient not taking: Reported on 08/16/2020 09/18/17   Alisa Graff, FNP  fluticasone (FLONASE) 50 MCG/ACT nasal  spray Place into the nose. 01/30/19 08/16/20  [provider]  Fluticasone-Salmeterol (ADVAIR) 500-50 MCG/DOSE AEPB Inhale 1 puff into the lungs 2 (two) times daily.    [provider]  furosemide (LASIX) 40 MG tablet Take 40 mg by mouth daily.    [provider]  glimepiride (AMARYL) 2 MG tablet Take 2 mg by  mouth daily with breakfast.    [provider]  Iron-Vitamin C (VITRON-C) 65-125 MG TABS Take 1 tablet by mouth daily. 10/08/19   Earlie Server, MD  levocetirizine (XYZAL) 5 MG tablet Take 5 mg by mouth every evening.    [provider]  meloxicam (MOBIC) 7.5 MG tablet Take 7.5 mg by mouth 2 (two) times daily as needed for pain. Takes one every other day    [provider]  metFORMIN (GLUCOPHAGE) 500 MG tablet Take 1,000 mg by mouth 2 (two) times daily with a meal.    [provider]  metoprolol (TOPROL-XL) 200 MG 24 hr tablet TAKE 1 TABLET BY MOUTH ONCE DAILY. TAKE WITH OR IMMEDIATELY FOLLOWING A MEAL. Patient not taking: Reported on 11/29/2020 05/20/17   Darylene Price A, FNP  montelukast (SINGULAIR) 10 MG tablet Take 10 mg by mouth at bedtime.    [provider]  Multiple Vitamin (MULTIVITAMIN) tablet Take 1 tablet by mouth daily.     [provider]  Omega 3-6-9 Fatty Acids (TRIPLE OMEGA-3-6-9) CAPS Take 1 capsule by mouth daily.    [provider]  omeprazole (PRILOSEC) 20 MG capsule Take 20 mg by mouth 2 (two) times daily before a meal.    [provider]  ondansetron (ZOFRAN-ODT) 4 MG disintegrating tablet Take 4 mg every 8 (eight) hours as needed by mouth for nausea or vomiting.    [provider]  pantoprazole (PROTONIX) 40 MG tablet Take 40 mg by mouth 2 (two) times daily.    [provider]  potassium chloride (K-DUR) 10 MEQ tablet TAKE 1 TABLET BY MOUTH ONCE DAILY Patient not taking: Reported on 11/29/2020 09/18/17   Alisa Graff, FNP  predniSONE (DELTASONE) 10 MG tablet Take by mouth. Patient not taking: Reported on 08/16/2020 06/01/19   [provider]  rosuvastatin (CRESTOR) 20 MG tablet Take 20 mg by mouth daily.    [provider]  Semaglutide,0.25 or 0.'5MG'$ /DOS, (OZEMPIC, 0.25 OR 0.5 MG/DOSE,) 2 MG/1.5ML SOPN Inject 0.375 mLs into the skin once a week. Patient not taking: Reported on  04/08/2020 10/22/18   [provider]  Tiotropium Bromide Monohydrate 1.25 MCG/ACT AERS Inhale 2 puffs into the lungs daily.    [provider]  traMADol-acetaminophen (ULTRACET) 37.5-325 MG tablet Take 1 tablet by mouth every 6 (six) hours as needed. Takes twice a day    [provider]  traZODone (DESYREL) 100 MG tablet Take 100 mg by mouth at bedtime.    [provider]  vitamin B-12 (CYANOCOBALAMIN) 500 MCG tablet Take 500 mcg daily by mouth.    [provider]  vitamin C (ASCORBIC ACID) 500 MG tablet Take 500 mg by mouth daily.     [provider]  vitamin E 400 UNIT capsule Take 400 Units by mouth daily.    [provider]    SD status Transfer to [facility]    DVT/GI PRX  assessed I Assessed the need for Labs I Assessed the need for Foley I Assessed the need for Central Venous Line Family Discussion when available I Assessed the need for Mobilization I made  an Assessment of medications to be adjusted accordingly Safety Risk assessment completed  CASE DISCUSSED IN MULTIDISCIPLINARY ROUNDS WITH ICU TEAM    Jehad Bisono Patricia Pesa, M.D.  [facility]  Medical Director [place] Director [place] Department

## 2021-08-04 ENCOUNTER — Telehealth: Payer: Self-pay

## 2021-08-04 DIAGNOSIS — R7401 Elevation of levels of liver transaminase levels: Secondary | ICD-10-CM

## 2021-08-04 DIAGNOSIS — R7881 Bacteremia: Secondary | ICD-10-CM | POA: Diagnosis not present

## 2021-08-04 DIAGNOSIS — G009 Bacterial meningitis, unspecified: Secondary | ICD-10-CM | POA: Diagnosis not present

## 2021-08-04 LAB — CBC
HCT: 24.9 % — ABNORMAL LOW (ref 36.0–46.0)
Hemoglobin: 7.9 g/dL — ABNORMAL LOW (ref 12.0–15.0)
MCH: 30.7 pg (ref 26.0–34.0)
MCHC: 31.7 g/dL (ref 30.0–36.0)
MCV: 96.9 fL (ref 80.0–100.0)
Platelets: 171 10*3/uL (ref 150–400)
RBC: 2.57 MIL/uL — ABNORMAL LOW (ref 3.87–5.11)
RDW: 13 % (ref 11.5–15.5)
WBC: 8.7 10*3/uL (ref 4.0–10.5)
nRBC: 0.5 % — ABNORMAL HIGH (ref 0.0–0.2)

## 2021-08-04 LAB — CULTURE, BLOOD (ROUTINE X 2): Culture: NO GROWTH

## 2021-08-04 LAB — GLUCOSE, CAPILLARY
Glucose-Capillary: 106 mg/dL — ABNORMAL HIGH (ref 70–99)
Glucose-Capillary: 107 mg/dL — ABNORMAL HIGH (ref 70–99)
Glucose-Capillary: 139 mg/dL — ABNORMAL HIGH (ref 70–99)
Glucose-Capillary: 242 mg/dL — ABNORMAL HIGH (ref 70–99)
Glucose-Capillary: 243 mg/dL — ABNORMAL HIGH (ref 70–99)
Glucose-Capillary: 92 mg/dL (ref 70–99)
Glucose-Capillary: 93 mg/dL (ref 70–99)

## 2021-08-04 LAB — COMPREHENSIVE METABOLIC PANEL
ALT: 154 U/L — ABNORMAL HIGH (ref 0–44)
AST: 176 U/L — ABNORMAL HIGH (ref 15–41)
Albumin: 2.8 g/dL — ABNORMAL LOW (ref 3.5–5.0)
Alkaline Phosphatase: 69 U/L (ref 38–126)
Anion gap: 6 (ref 5–15)
BUN: 25 mg/dL — ABNORMAL HIGH (ref 8–23)
CO2: 27 mmol/L (ref 22–32)
Calcium: 9.5 mg/dL (ref 8.9–10.3)
Chloride: 108 mmol/L (ref 98–111)
Creatinine, Ser: 1.21 mg/dL — ABNORMAL HIGH (ref 0.44–1.00)
GFR, Estimated: 46 mL/min — ABNORMAL LOW (ref >60)
Glucose, Bld: 237 mg/dL — ABNORMAL HIGH (ref 70–99)
Potassium: 3.6 mmol/L (ref 3.5–5.1)
Sodium: 141 mmol/L (ref 135–145)
Total Bilirubin: 0.8 mg/dL (ref 0.3–1.2)
Total Protein: 5.8 g/dL — ABNORMAL LOW (ref 6.5–8.1)

## 2021-08-04 LAB — PHOSPHORUS: Phosphorus: 2.8 mg/dL (ref 2.5–4.6)

## 2021-08-04 LAB — MAGNESIUM: Magnesium: 1.8 mg/dL (ref 1.7–2.4)

## 2021-08-04 MED ORDER — TRAMADOL HCL 50 MG PO TABS
50.0000 mg | ORAL_TABLET | Freq: Four times a day (QID) | ORAL | Status: DC | PRN
  Administered 2021-08-04 – 2021-08-08 (×13): 50 mg via ORAL
  Filled 2021-08-04 (×13): qty 1

## 2021-08-04 MED ORDER — IPRATROPIUM-ALBUTEROL 0.5-2.5 (3) MG/3ML IN SOLN
3.0000 mL | Freq: Three times a day (TID) | RESPIRATORY_TRACT | Status: DC | PRN
Start: 2021-08-04 — End: 2021-08-11

## 2021-08-04 NOTE — Telephone Encounter (Signed)
Patient currently admitted in ICU. Patient has appt with Dr. Tasia Catchings on Monday 6/19. Need to r/s once patient is discharged

## 2021-08-04 NOTE — Progress Notes (Signed)
Good day. Ate better today. Family in and out . Treated x 1 for right knee pain and treated x 1 for nausea. Very HOH.

## 2021-08-04 NOTE — Progress Notes (Signed)
PT Cancellation Note  Patient Details Name: Diane Patton MRN: [id number] DOB: 1945/03/06   Cancelled Treatment:    Reason Eval/Treat Not Completed: Patient's level of consciousness;Medical issues which prohibited therapy (2 attempts to see patient this date. Pt still quite confused in AM. Upon return in afternoon, Pt still confused, mildly restless in bed, pressures elevated, pt yawning repeatedly.) Will defer PT to later date/time when pt is better able to participate.   2:40 PM, 08/04/21 Etta Grandchild, PT, DPT Physical Therapist - [facility]  [phone] ([place])     [place] C 08/04/2021, 2:39 PM

## 2021-08-04 NOTE — Progress Notes (Addendum)
1400 Stooled around rectal tube. Large amount of stool. 26 Talking to granddaughter.

## 2021-08-04 NOTE — Progress Notes (Signed)
PROGRESS NOTE    Diane Patton  MPN:[id number] DOB: 29-Apr-1945 DOA: 07/30/2021 PCP: Tracie Harrier, MD   Assessment & Plan:   Principal Problem:   Sepsis [facility]) Active Problems:   Acute metabolic encephalopathy   Acute respiratory failure with hypoxia ([place])   Chronic systolic heart failure ([place])   Essential hypertension   Diabetes mellitus, type 2 (HCC)   Chronic obstructive pulmonary disease (HCC)   Cardiomyopathy, nonischemic (HCC)   Anemia in chronic kidney disease   Stage 3b chronic kidney disease ([place])  Assessment and Plan: Severe acute hypoxic & hypercapnic respiratory: resolved. Currently on RA  Septic shock: see Dr. Zoila Shutter note on how pt meet septic shock criteria. Secondary to bacterial meningitis, strep pneumonia bacteremia. Off of pressors. 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 W/ hearing loss. MRI brain showed persistent findings consistent w/ hx of meningitis, similar associated hemorrhage at the right temporal region, persistent large b/l mastoid effusions  Bacteremia: blood cxs growing strep pneumonia. Continue on IV rocephin as per ID   Right temporal hemorrhage: EEG was WNL, no seizures or epileptiform dischages were seen. Neuro following and recs   Acute toxic encephalopathy: secondary to bacterial meningitis.   AKI: Cr is labile. Avoid nephrotoxic meds   Transaminitis: etiology unclear, possibly secondary to above.   Normocytic anemia: H&H are trending down. Will transfuse if Hb < 7.0       DVT prophylaxis: lovenox  Code Status: full  Family Communication:  Disposition Plan: likely d/c to SNF  Level of care: ICU  Status is: Inpatient Remains inpatient appropriate because: severity of illness   Consultants:  ICU ID Neuro   Procedures:   Antimicrobials: rocephin   Subjective: Pt c/o fatigue   Objective: Vitals:   08/03/21 1800 08/03/21 2000 08/03/21 2100 08/04/21 0749  BP: (!) 154/55 (!) 135/95  106/89   Pulse: 97 98 (!) 103 (!) 103  Resp: '15 18 20 '$ (!) 22  Temp:  (!) 100.7 F (38.2 C)    TempSrc:  Oral    SpO2: 100% 100% 100% 100%  Weight:      Height:        Intake/Output Summary (Last 24 hours) at 08/04/2021 0807 Last data filed at 08/03/2021 1812 Gross per 24 hour  Intake 628.33 ml  Output 950 ml  Net -321.67 ml   Filed Weights   07/30/21 1806 07/30/21 2259  Weight: 77.1 kg 79.4 kg    Examination:  General exam: Appears calm and comfortable. Hard of hearing  Respiratory system: Clear to auscultation. Respiratory effort normal. Cardiovascular system: S1 & S2 +. No rubs, gallops or clicks. No pedal edema. Gastrointestinal system: Abdomen is nondistended, soft and nontender. Normal bowel sounds heard. Central nervous system: Alert and awake. Moves all extremities  Psychiatry: Judgement and insight appears not at baseline. Flat mood and affect     Data Reviewed: I have personally reviewed following labs and imaging studies  CBC: Recent Labs  Lab 07/30/21 1803 07/31/21 0305 08/01/21 0510 08/02/21 0352 08/03/21 0338  WBC 11.3* 25.3* 11.7* 9.3 9.9  NEUTROABS 9.3*  --   --   --   --   HGB 10.0* 11.1* 8.7* 7.6* 8.1*  HCT 31.5* 35.6* 26.8* 24.4* 26.5*  MCV 97.2 98.9 96.4 97.6 98.1  PLT 172 308 162 146* 008   Basic Metabolic Panel: Recent Labs  Lab 07/30/21 1803 07/30/21 2336 07/31/21 0305 07/31/21 0545 08/01/21 0510 08/02/21 0352 08/03/21 0338 08/04/21 0552  NA  140  --  139  --  141 144 145  --   K 3.7  --  3.6  --  3.6 3.9 3.9  --   CL 107  --  106  --  110 112* 111  --   CO2 27  --  24  --  '25 27 26  '$ --   GLUCOSE 195*  --  321*  --  211* 158* 119*  --   BUN 22  --  23  --  35* 41* 36*  --   CREATININE 1.38*  --  1.52*  --  1.48* 1.30* 1.24*  --   CALCIUM 9.3  --  8.8*  --  9.1 9.4 9.4  --   MG  --    < >  --  3.0* 2.4 2.6* 2.1 1.8  PHOS  --    < >  --  3.7 4.4 3.6 3.0 2.8   < > = values in this interval not displayed.   GFR: Estimated  Creatinine Clearance: 40.4 mL/min (A) (by C-G formula based on SCr of 1.24 mg/dL (H)). Liver Function Tests: Recent Labs  Lab 07/30/21 1803 07/31/21 0305  AST 20 26  ALT 13 18  ALKPHOS 39 36*  BILITOT 0.9 1.1  PROT 6.4* 6.4*  ALBUMIN 3.8 3.3*   No results for input(s): "LIPASE", "AMYLASE" in the last 168 hours. No results for input(s): "AMMONIA" in the last 168 hours. Coagulation Profile: Recent Labs  Lab 07/30/21 2100 07/31/21 0305  INR 1.1 1.2   Cardiac Enzymes: No results for input(s): "CKTOTAL", "CKMB", "CKMBINDEX", "TROPONINI" in the last 168 hours. BNP (last 3 results) No results for input(s): "PROBNP" in the last 8760 hours. HbA1C: No results for input(s): "HGBA1C" in the last 72 hours. CBG: Recent Labs  Lab 08/03/21 1610 08/03/21 1946 08/04/21 0005 08/04/21 0413 08/04/21 0733  GLUCAP 157* 107* 106* 92 93   Lipid Profile: Recent Labs    08/02/21 0352  TRIG 153*   Thyroid Function Tests: No results for input(s): "TSH", "T4TOTAL", "FREET4", "T3FREE", "THYROIDAB" in the last 72 hours. Anemia Panel: No results for input(s): "VITAMINB12", "FOLATE", "FERRITIN", "TIBC", "IRON", "RETICCTPCT" in the last 72 hours. Sepsis Labs: Recent Labs  Lab 07/30/21 1803 07/30/21 2100 07/31/21 0305 08/01/21 0510 08/02/21 0352  PROCALCITON <0.10  --  34.44 40.11 27.18  LATICACIDVEN 2.2* 3.5* 4.1* 2.2*  --     Recent Results (from the past 240 hour(s))  Resp Panel by RT-PCR (Flu A&B, Covid) Anterior Nasal Swab     Status: None   Collection Time: 07/30/21  6:20 PM   Specimen: Anterior Nasal Swab  Result Value Ref Range Status   SARS Coronavirus 2 by RT PCR NEGATIVE NEGATIVE Final    Comment: (NOTE) SARS-CoV-2 target nucleic acids are NOT DETECTED.  The SARS-CoV-2 RNA is generally detectable in upper respiratory specimens during the acute phase of infection. The lowest concentration of SARS-CoV-2 viral copies this assay can detect is 138 copies/mL. A negative  result does not preclude SARS-Cov-2 infection and should not be used as the sole basis for treatment or other patient management decisions. A negative result may occur with  improper specimen collection/handling, submission of specimen other than nasopharyngeal swab, presence of viral mutation(s) within the areas targeted by this assay, and inadequate number of viral copies(<138 copies/mL). A negative result must be combined with clinical observations, patient history, and epidemiological information. The expected result is Negative.  Fact Sheet for Patients:  EntrepreneurPulse.com.au  Fact  Sheet for Healthcare Providers:  IncredibleEmployment.be  This test is no t yet approved or cleared by the [place] FDA and  has been authorized for detection and/or diagnosis of SARS-CoV-2 by FDA under an Emergency Use Authorization (EUA). This EUA will remain  in effect (meaning this test can be used) for the duration of the COVID-19 declaration under Section 564(b)(1) of the Act, 21 U.S.C.section 360bbb-3(b)(1), unless the authorization is terminated  or revoked sooner.       Influenza A by PCR NEGATIVE NEGATIVE Final   Influenza B by PCR NEGATIVE NEGATIVE Final    Comment: (NOTE) The Xpert Xpress SARS-CoV-2/FLU/RSV plus assay is intended as an aid in the diagnosis of influenza from Nasopharyngeal swab specimens and should not be used as a sole basis for treatment. Nasal washings and aspirates are unacceptable for Xpert Xpress SARS-CoV-2/FLU/RSV testing.  Fact Sheet for Patients: EntrepreneurPulse.com.au  Fact Sheet for Healthcare Providers: IncredibleEmployment.be  This test is not yet approved or cleared by the [place] FDA and has been authorized for detection and/or diagnosis of SARS-CoV-2 by FDA under an Emergency Use Authorization (EUA). This EUA will remain in effect (meaning this test can be used)  for the duration of the COVID-19 declaration under Section 564(b)(1) of the Act, 21 U.S.C. section 360bbb-3(b)(1), unless the authorization is terminated or revoked.  Performed at [facility], [address], [place], [place] [address]   Blood Culture (routine x 2)     Status: Abnormal   Collection Time: 07/30/21  6:20 PM   Specimen: BLOOD  Result Value Ref Range Status   Specimen Description   Final    BLOOD RAC Performed at [facility], [address] [place], [place] [address]    Special Requests   Final    BOTTLES DRAWN AEROBIC AND ANAEROBIC BCLV Performed at [facility], [address], [place], [place] [address]    Culture  Setup Time   Final    GRAM POSITIVE COCCI IN BOTH AEROBIC AND ANAEROBIC BOTTLES Organism ID to follow CRITICAL RESULT CALLED TO, READ BACK BY AND VERIFIED WITH: Violeta Gelinas 07/31/21 0515 MW Performed at [facility], [address] [place], [place] [address]    Culture STREPTOCOCCUS PNEUMONIAE (A)  Final   Report Status 08/02/2021 FINAL  Final   Organism ID, Bacteria STREPTOCOCCUS PNEUMONIAE  Final      Susceptibility   Streptococcus pneumoniae - MIC*    ERYTHROMYCIN >=8 RESISTANT Resistant     LEVOFLOXACIN 0.5 SENSITIVE Sensitive     VANCOMYCIN 0.25 SENSITIVE Sensitive     PENICILLIN (meningitis) 0.25 RESISTANT Resistant     PENO - penicillin 0.25      PENICILLIN (non-meningitis) 0.25 SENSITIVE Sensitive     PENICILLIN (oral) 0.25 INTERMEDIATE Intermediate     CEFTRIAXONE (non-meningitis) <=0.12 SENSITIVE Sensitive     CEFTRIAXONE (meningitis) <=0.12 SENSITIVE Sensitive     * STREPTOCOCCUS PNEUMONIAE  Urine Culture     Status: None   Collection Time: 07/30/21  6:20 PM   Specimen: Urine, Random  Result Value Ref Range Status   Specimen Description   Final    URINE, RANDOM Performed at [facility], [address], [place], [place] [address]    Special Requests   Final     NONE Performed at [facility], [address], [place], [place] [address]    Culture   Final    NO GROWTH Performed at [facility], [place] [address],  [place], [place] [address]    Report Status 08/01/2021 FINAL  Final  Blood Culture ID Panel (Reflexed)     Status: Abnormal   Collection Time: 07/30/21  6:20 PM  Result Value Ref Range Status   Enterococcus faecalis NOT DETECTED NOT DETECTED Final   Enterococcus Faecium NOT DETECTED NOT DETECTED Final   Listeria monocytogenes NOT DETECTED NOT DETECTED Final   Staphylococcus species NOT DETECTED NOT DETECTED Final   Staphylococcus aureus (BCID) NOT DETECTED NOT DETECTED Final   Staphylococcus epidermidis NOT DETECTED NOT DETECTED Final   Staphylococcus lugdunensis NOT DETECTED NOT DETECTED Final   Streptococcus species DETECTED (A) NOT DETECTED Final    Comment: CRITICAL RESULT CALLED TO, READ BACK BY AND VERIFIED WITH: JASON ROBBINS 07/31/21 0515 MW    Streptococcus agalactiae NOT DETECTED NOT DETECTED Final   Streptococcus pneumoniae DETECTED (A) NOT DETECTED Final    Comment: CRITICAL RESULT CALLED TO, READ BACK BY AND VERIFIED WITH: JASON ROBBINS 07/31/21 0515 MW    Streptococcus pyogenes NOT DETECTED NOT DETECTED Final   A.calcoaceticus-baumannii NOT DETECTED NOT DETECTED Final   Bacteroides fragilis NOT DETECTED NOT DETECTED Final   Enterobacterales NOT DETECTED NOT DETECTED Final   Enterobacter cloacae complex NOT DETECTED NOT DETECTED Final   Escherichia coli NOT DETECTED NOT DETECTED Final   Klebsiella aerogenes NOT DETECTED NOT DETECTED Final   Klebsiella oxytoca NOT DETECTED NOT DETECTED Final   Klebsiella pneumoniae NOT DETECTED NOT DETECTED Final   Proteus species NOT DETECTED NOT DETECTED Final   Salmonella species NOT DETECTED NOT DETECTED Final   Serratia marcescens NOT DETECTED NOT DETECTED Final   Haemophilus influenzae NOT DETECTED NOT DETECTED Final   Neisseria meningitidis NOT DETECTED NOT  DETECTED Final   Pseudomonas aeruginosa NOT DETECTED NOT DETECTED Final   Stenotrophomonas maltophilia NOT DETECTED NOT DETECTED Final   Candida albicans NOT DETECTED NOT DETECTED Final   Candida auris NOT DETECTED NOT DETECTED Final   Candida glabrata NOT DETECTED NOT DETECTED Final   Candida krusei NOT DETECTED NOT DETECTED Final   Candida parapsilosis NOT DETECTED NOT DETECTED Final   Candida tropicalis NOT DETECTED NOT DETECTED Final   Cryptococcus neoformans/gattii NOT DETECTED NOT DETECTED Final    Comment: Performed at [facility], [address] [place], [place] [address]  Culture, blood (Routine X 2) w Reflex to ID Panel     Status: None   Collection Time: 07/30/21 11:36 PM   Specimen: BLOOD  Result Value Ref Range Status   Specimen Description BLOOD RIGHT WRIST  Final   Special Requests   Final    BOTTLES DRAWN AEROBIC ONLY Blood Culture results may not be optimal due to an inadequate volume of blood received in culture bottles   Culture   Final    NO GROWTH 5 DAYS Performed at [facility], [address], [place], [place] [address]    Report Status 08/04/2021 FINAL  Final  Culture, Respiratory w Gram Stain     Status: None   Collection Time: 07/31/21  1:34 AM   Specimen: Tracheal Aspirate; Respiratory  Result Value Ref Range Status   Specimen Description   Final    TRACHEAL ASPIRATE Performed at [facility], [address] [place], [place] [address]    Special Requests   Final    NONE Performed at [facility], [place], [place] [address]    Gram Stain NO WBC SEEN NO ORGANISMS SEEN   Final   Culture  Final    Normal respiratory flora-no Staph aureus or Pseudomonas seen Performed at [facility] [address], [place], [place] [address]    Report Status 08/02/2021 FINAL  Final  CSF culture w Gram Stain     Status: None   Collection Time: 07/31/21  2:42 PM   Specimen: PATH Cytology CSF;  Cerebrospinal Fluid  Result Value Ref Range Status   Specimen Description   Final    CSF Performed at [facility], [address] [place], [place] [address]    Special Requests   Final    NONE Performed at [facility], [address] [place], [place] [address]    Gram Stain   Final    NO ORGANISMS SEEN WBC SEEN RED BLOOD CELLS RESULT CALLED TO, READ BACK BY AND VERIFIED WITH: EVON CONNALLY AT 5456 ON 07/31/21 BY SS Performed at [facility], [address] [place], [place] [address]    Culture   Final    NO GROWTH 3 DAYS Performed at [facility], [place] [address], [place], [place] [address]    Report Status 08/03/2021 FINAL  Final  Culture, fungus without smear     Status: None (Preliminary result)   Collection Time: 07/31/21  2:42 PM   Specimen: PATH Cytology CSF; Cerebrospinal Fluid  Result Value Ref Range Status   Specimen Description   Final    CSF Performed at [facility], [address] [place], [place] [address]    Special Requests   Final    NONE Performed at [facility], [address], [place], [place] [address]    Culture   Final    NO GROWTH 3 DAYS Performed at [facility], [place] [address] [place], [place] [address]    Report Status PENDING  Incomplete  Culture, blood (Routine X 2) w Reflex to ID Panel     Status: None (Preliminary result)   Collection Time: 08/02/21  3:50 AM   Specimen: BLOOD  Result Value Ref Range Status   Specimen Description BLOOD RIGHT HAND  Final   Special Requests   Final    BOTTLES DRAWN AEROBIC ONLY Blood Culture adequate volume   Culture   Final    NO GROWTH 2 DAYS Performed at [facility], [address], [place], [place] [address]    Report Status PENDING  Incomplete  Culture, blood (Routine X 2) w Reflex to ID Panel     Status: None (Preliminary result)   Collection Time: 08/02/21  3:50 AM   Specimen: BLOOD  Result Value Ref Range Status    Specimen Description BLOOD RIGHT INDEX FINGER  Final   Special Requests IN PEDIATRIC BOTTLE Blood Culture adequate volume  Final   Culture   Final    NO GROWTH 2 DAYS Performed at [facility], [address] [place], [place] [address]    Report Status PENDING  Incomplete         Radiology Studies: MR BRAIN W WO CONTRAST  Result Date: 08/04/2021 CLINICAL DATA:  Follow-up examination for meningitis. EXAM: MRI HEAD WITHOUT AND WITH CONTRAST TECHNIQUE: Multiplanar, multiecho pulse sequences of the brain and surrounding structures were obtained without and with intravenous contrast. CONTRAST:  19m GADAVIST GADOBUTROL 1 MMOL/ML IV SOLN COMPARISON:  Previous brain MRI from 08/02/2021. FINDINGS: Brain: Examination mildly degraded by motion. Age advanced cerebral atrophy. No evidence for acute or interval infarction. Previously seen sulcal FLAIR signal abnormality again  seen involving the right temporal lobe, superior to the right temporal bone and tegmen tympani. Susceptibility artifact again seen within this region, consistent with hemorrhage, similar to previous, although an additional small focus of blood products is now seen superiorly at the right frontal region (series 13, image 39). Additional scattered sulcal FLAIR signal about the frontal parietal lobes again noted as well. Associated intermittent diffusion signal abnormality. Suspected subtle scattered leptomeningeal enhancement, although postcontrast sequences are degraded by motion. Trace diffusion signal abnormality lying within the occipital horns of the lateral ventricles likely reflect debris. Findings are consistent with known history of meningitis. Overall, appearance is slightly diminished as compared to previous MRI, suggesting some interval response to therapy. No progressive hemorrhagic blood products. No extra-axial or intraparenchymal abscess. No other convincing evidence for concomitant ventriculitis. No mass lesion or  midline shift. No hydrocephalus or extra-axial fluid collection. Markedly empty sella noted. No other abnormal enhancement. Vascular: Major intracranial vascular flow voids are maintained. Specific note made of preserved flow voids within the major dural sinuses. Skull and upper cervical spine: Bone marrow signal intensity within normal limits. No scalp soft tissue abnormality. Sinuses/Orbits: Globes and orbital soft tissues within normal limits. Changes of chronic sinusitis with sequelae of prior sinus surgery noted. Large bilateral mastoid effusions again noted. Evaluation for potential coalescence limited on this exam. Other: None. IMPRESSION: 1. Persistent findings consistent with known history of meningitis, overall slightly decreased in prominence as compared to previous MRI, suggesting partial interval response to therapy. Similar associated hemorrhage at the right temporal region. 2. Persistent large bilateral mastoid effusions. Electronically Signed   By: Jeannine Boga M.D.   On: 08/04/2021 02:07   EEG adult  Result Date: 08/03/2021 Lora Havens, MD     08/03/2021  6:36 PM Patient Name: Diane Patton MRN: [id number] Epilepsy Attending: Lora Havens Referring Physician/Provider: Lorenza Chick, MD Date: 08/03/2021 Duration: 24.33 mins Patient history: 76 year old doing remarkably well after meningitis.  I do suspect that her hearing loss is secondary to the meningitis and the involvement of the right temporal lobe likely explains the right arm weakness compared to the left.  However given hemorrhage as well, EEG to evaluate for seizure. Level of alertness: Awake AEDs during EEG study: None Technical aspects: This EEG study was done with scalp electrodes positioned according to the 10-20 International system of electrode placement. Electrical activity was acquired at a sampling rate of '500Hz'$  and reviewed with a high frequency filter of '70Hz'$  and a low frequency filter of '1Hz'$ . EEG data were  recorded continuously and digitally stored. Description: The posterior dominant rhythm consists of 8 Hz activity of moderate voltage (25-35 uV) seen predominantly in posterior head regions, symmetric and reactive to eye opening and eye closing. Drowsiness was characterized by attenuation of the posterior background rhythm. Hyperventilation and photic stimulation were not performed.   IMPRESSION: This study is within normal limits. No seizures or epileptiform discharges were seen throughout the recording. Lora Havens   MR BRAIN WO CONTRAST  Result Date: 08/02/2021 CLINICAL DATA:  Sinusitis, rapid progression mastoiditis and pre chin tegmen tympani EXAM: MRI HEAD WITHOUT CONTRAST TECHNIQUE: Multiplanar, multiecho pulse sequences of the brain and surrounding structures were obtained without intravenous contrast. COMPARISON:  2017 FINDINGS: Brain: There is no acute infarction or intracranial hemorrhage. There is abnormal sulcal T2 FLAIR hyperintensity in the right temporal lobe including above the mastoid. There is susceptibility in this region. Some additional areas of abnormal sulcal T2 FLAIR hyperintensity in the frontoparietal  lobes bilaterally. There is no intracranial mass, mass effect, or edema. There is no hydrocephalus or extra-axial fluid collection. Prominence of the ventricles and sulci reflects parenchymal volume loss. Vascular: Major vessel flow voids at the skull base are preserved. Skull and upper cervical spine: Normal marrow signal is preserved. Sinuses/Orbits: Evidence of sinonasal surgery with mild mucosal thickening. Orbits are unremarkable. Other: Sella is expanded and "empty." Bilateral mastoid fluid opacification. Coalescence cannot be evaluated on this study. IMPRESSION: Abnormal sulcal signal in the frontoparietal lobes and the right temporal lobe above the mastoid. This likely reflects meningitis. There is also susceptibility in the right temporal region setting superimposed  hemorrhage. Bilateral mastoid fluid opacification. Electronically Signed   By: Macy Mis M.D.   On: 08/02/2021 15:13        Scheduled Meds:  Chlorhexidine Gluconate Cloth  6 each Topical Daily   enoxaparin (LOVENOX) injection  40 mg Subcutaneous Q24H   feeding supplement  237 mL Oral BID BM   insulin aspart  0-9 Units Subcutaneous Q4H   insulin detemir  8 Units Subcutaneous BID   ipratropium-albuterol  3 mL Nebulization TID   multivitamin with minerals  1 tablet Oral Daily   pantoprazole (PROTONIX) IV  40 mg Intravenous Q24H   Continuous Infusions:  cefTRIAXone (ROCEPHIN)  IV 2 g (08/04/21 0609)     LOS: 5 days    Time spent: 30 mins     Wyvonnia Dusky, MD [facility] Pager [phone]  If 7PM-7AM, please contact night-coverage  08/04/2021, 8:07 AM

## 2021-08-05 DIAGNOSIS — R7401 Elevation of levels of liver transaminase levels: Secondary | ICD-10-CM | POA: Diagnosis not present

## 2021-08-05 DIAGNOSIS — G001 Pneumococcal meningitis: Secondary | ICD-10-CM | POA: Diagnosis not present

## 2021-08-05 DIAGNOSIS — G009 Bacterial meningitis, unspecified: Secondary | ICD-10-CM | POA: Diagnosis not present

## 2021-08-05 DIAGNOSIS — R7881 Bacteremia: Secondary | ICD-10-CM | POA: Diagnosis not present

## 2021-08-05 LAB — COMPREHENSIVE METABOLIC PANEL
ALT: 154 U/L — ABNORMAL HIGH (ref 0–44)
AST: 90 U/L — ABNORMAL HIGH (ref 15–41)
Albumin: 2.6 g/dL — ABNORMAL LOW (ref 3.5–5.0)
Alkaline Phosphatase: 64 U/L (ref 38–126)
Anion gap: 6 (ref 5–15)
BUN: 22 mg/dL (ref 8–23)
CO2: 28 mmol/L (ref 22–32)
Calcium: 9.6 mg/dL (ref 8.9–10.3)
Chloride: 106 mmol/L (ref 98–111)
Creatinine, Ser: 0.91 mg/dL (ref 0.44–1.00)
GFR, Estimated: >60 mL/min (ref >60)
Glucose, Bld: 124 mg/dL — ABNORMAL HIGH (ref 70–99)
Potassium: 3.7 mmol/L (ref 3.5–5.1)
Sodium: 140 mmol/L (ref 135–145)
Total Bilirubin: 0.7 mg/dL (ref 0.3–1.2)
Total Protein: 5.7 g/dL — ABNORMAL LOW (ref 6.5–8.1)

## 2021-08-05 LAB — CBC
HCT: 25.1 % — ABNORMAL LOW (ref 36.0–46.0)
Hemoglobin: 8 g/dL — ABNORMAL LOW (ref 12.0–15.0)
MCH: 30.4 pg (ref 26.0–34.0)
MCHC: 31.9 g/dL (ref 30.0–36.0)
MCV: 95.4 fL (ref 80.0–100.0)
Platelets: 198 10*3/uL (ref 150–400)
RBC: 2.63 MIL/uL — ABNORMAL LOW (ref 3.87–5.11)
RDW: 12.9 % (ref 11.5–15.5)
WBC: 8.7 10*3/uL (ref 4.0–10.5)
nRBC: 0 % (ref 0.0–0.2)

## 2021-08-05 LAB — GLUCOSE, CAPILLARY
Glucose-Capillary: 113 mg/dL — ABNORMAL HIGH (ref 70–99)
Glucose-Capillary: 140 mg/dL — ABNORMAL HIGH (ref 70–99)
Glucose-Capillary: 164 mg/dL — ABNORMAL HIGH (ref 70–99)
Glucose-Capillary: 156 mg/dL — ABNORMAL HIGH (ref 70–99)
Glucose-Capillary: 202 mg/dL — ABNORMAL HIGH (ref 70–99)

## 2021-08-05 LAB — PHOSPHORUS: Phosphorus: 3.6 mg/dL (ref 2.5–4.6)

## 2021-08-05 LAB — MAGNESIUM: Magnesium: 1.7 mg/dL (ref 1.7–2.4)

## 2021-08-05 NOTE — Progress Notes (Signed)
Speech Language Pathology Treatment: Dysphagia  Patient Details Name: Diane Patton MRN: [id number] DOB: 02-18-1946 Today's Date: 08/05/2021 Time: 6546-5035 SLP Time Calculation (min) (ACUTE ONLY): 40 min  Assessment / Plan / Recommendation Clinical Impression  Pt seen for ongoing assessment of swallowing and trials to upgrade diet today. She is feeling "much better" and "just want my appetite to come back", she stated. Pt does wear Dentures when eating but requested to leave them "at home" -- she would "do the best I can w/ soft foods here". She stated she "sometimes" eats w/out them at home.  Pt is alert, verbally responsive and engaged in conversation w/ family on phone upon entering room -- she seemed to be able to hear them per the conversation. She seemed to hear this Clinician upon speaking in to her Left ear. HOH. Pt is on RA; wbc wnl. Recent transfer out of CCU.   Pt explained general aspiration precautions and agreed verbally to the need for following them especially sitting upright for all oral intake; small bites and sips. She was supported behind the back more for full upright sitting. Pt then fed herself trials of thin liquids via straw/cup, purees, and softened/cut solids moistened in puree. NO overt clinical s/s of aspiration were noted w/ any consistency; respiratory status remained calm and unlabored, vocal quality clear b/t trials; no cough. Oral phase appeared adequate and WFL for bolus management and timely A-P transfer for swallowing of trials; min extra Time needed for mashing/gumming the increased textured solid food trials (given moistening w/ puree). Pt stated she was able to "do ok like this". Oral clearing achieved w/ all consistencies. NSG denied any swallowing deficits as well. Pt is feeding herself.   Pt appears at reduced risk for aspriation when following general aspiration precautions and using a slightly modified (foods) diet in setting of Edentulous status. Medical  status appears to be improving. Recommend upgrade to a MINCED foods w/ gravies added to moisten foods for ease of mashing/gumming(oral phase) -- this diet may also support oral intake better d/t appeal/presentation; Thin liquids. Recommend general aspiration precautions; Pills Whole in Puree as needed for safer swallowing; tray setup and positioning assistance for meals/po's. REFLUX precautions d/t pt's baseline.  No further skilled ST services indicate at this time. Pt should be able to return to a more regular diet consistency once at home wearing her Dentures, as at her Baseline. Will sign off at this time w/ MD to reconsult if any changes in status while admitted. NSG updated and agreed. Precautions posted at bedside.  Pt requested to make a phone at end of session -- instructed on phone, then pt dialed the number on her own.       HPI HPI: Pt is a 76 y/o female with h/o CHF, COPD, DM, GERD, HTN, HLD, CKD, OSA and gastric ulcer who is admitted with AMS, sepsis, bactermia, recent acute bacterial sinusitis and bronchitis, and suspected meningitis.  During admit, pt vomited while lying prone during a scan/test.   CXR on 6/12: Interval worsening lung status with diffuse interstitial edema,  small pleural effusions and patchy opacities most likely due to  alveolar edema. Query changes due to CHF or fluid overload versus  ARDS. Underlying pneumonia would also be difficult to exclude.  She required oral intubation on 6/12-6/14.  MRI: Abnormal sulcal signal in the frontoparietal lobes and the right  temporal lobe above the mastoid. This likely reflects meningitis.  Head CT: Generalized cerebral atrophy with chronic Norell matter small  vessel ischemic changes.  She is HOH.      SLP Plan  All goals met      Recommendations for follow up therapy are one component of a multi-disciplinary discharge planning process, led by the attending physician.  Recommendations may be updated based on patient status,  additional functional criteria and insurance authorization.    Recommendations  Diet recommendations: Dysphagia 2 (fine chop);Thin liquid Liquids provided via: Cup;Straw Medication Administration: Whole meds with puree (as needed for safer swallowing) Supervision: Patient able to self feed;Intermittent supervision to cue for compensatory strategies (setup support) Compensations: Minimize environmental distractions;Slow rate;Small sips/bites;Lingual sweep for clearance of pocketing;Follow solids with liquid Postural Changes and/or Swallow Maneuvers: Out of bed for meals;Seated upright 90 degrees;Upright 30-60 min after meal                General recommendations:  (Dietician f/u) Oral Care Recommendations: Oral care BID;Oral care before and after PO;Patient independent with oral care (support pt) Follow Up Recommendations: No SLP follow up (not suspected d/t improvement; Dentures at home) Assistance recommended at discharge: Set up Supervision/Assistance SLP Visit Diagnosis: Dysphagia, oral phase (R13.11) (edentulous - Dentures at home) Plan: All goals met             Orinda Kenner, [place], [place]; [place] [phone] (ascom) Joline Encalada  08/05/2021, 11:53 AM

## 2021-08-05 NOTE — Progress Notes (Signed)
PROGRESS NOTE    Diane Patton  YQI:[id number] DOB: November 26, 1945 DOA: 07/30/2021 PCP: Tracie Harrier, MD   Assessment & Plan:   Principal Problem:   Sepsis [facility]) Active Problems:   Acute metabolic encephalopathy   Acute respiratory failure with hypoxia ([place])   Chronic systolic heart failure ([place])   Essential hypertension   Diabetes mellitus, type 2 (HCC)   Chronic obstructive pulmonary disease (HCC)   Cardiomyopathy, nonischemic (HCC)   Anemia in chronic kidney disease   Stage 3b chronic kidney disease ([place])  Assessment and Plan: Severe acute hypoxic & hypercapnic respiratory: resolved. Currently on RA  Septic shock: see Dr. Zoila Shutter note on how pt meet septic shock criteria. Secondary to bacterial meningitis, strep pneumonia bacteremia. Off of pressors. 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 Hard of hearing is not new for the pt and was present prior to admission to the hospital as per pt's son. MRI brain showed persistent findings consistent w/ hx of meningitis, similar associated hemorrhage at the right temporal region, persistent large b/l mastoid effusions. Fever curve is trending down   Bacteremia: blood cxs growing strep pneumonia. Continue on IV ceftriaxone as per ID   Right temporal hemorrhage: EEG was WNL, no seizures or epileptiform dischages were seen. Neuro following and recs   Acute toxic encephalopathy: secondary to bacterial meningitis. Mental status is close to baseline   AKI: Cr is trending down from day prior. Avoid nephrotoxic meds   Transaminitis: etiology unclear. AST is still elevated but trending down. ALT is still elevated & stable from day prior   Normocytic anemia: H&H are labile. Will transfuse if Hb < 7.0       DVT prophylaxis: lovenox  Code Status: full  Family Communication: discussed pt's care w/ pt's son and answered his questions  Disposition Plan: likely d/c to SNF  Level of care: Progressive  Status  is: Inpatient Remains inpatient appropriate because: severity of illness   Consultants:  ICU ID Neuro   Procedures:   Antimicrobials: rocephin   Subjective: Pt c/o malaise   Objective: Vitals:   08/05/21 0600 08/05/21 0615 08/05/21 0700 08/05/21 0800  BP: (!) 159/68  (!) 161/75 (!) 152/71  Pulse: 88 (!) 122    Resp: '16 20 19 '$ (!) 22  Temp:    98.5 F (36.9 C)  TempSrc:    Oral  SpO2: 100% 95%  98%  Weight:      Height:        Intake/Output Summary (Last 24 hours) at 08/05/2021 0842 Last data filed at 08/05/2021 0818 Gross per 24 hour  Intake 987.07 ml  Output 2435 ml  Net -1447.93 ml   Filed Weights   07/30/21 1806 07/30/21 2259 08/05/21 0500  Weight: 77.1 kg 79.4 kg 82.4 kg    Examination:  General exam: Appears comfortable. Hard of hearing  Respiratory system: clear breath sounds b/l  Cardiovascular system: S1/S2+. No gallops or rubs  Gastrointestinal system: Abd is soft, NT, ND & hypoactive bowel sounds  Central nervous system: Alert & awake. Moves all extremities  Psychiatry: judgement and insight appears at baseline. Flat mood and affect     Data Reviewed: I have personally reviewed following labs and imaging studies  CBC: Recent Labs  Lab 07/30/21 1803 07/31/21 0305 08/01/21 0510 08/02/21 0352 08/03/21 0338 08/04/21 1222 08/05/21 0540  WBC 11.3*   < > 11.7* 9.3 9.9 8.7 8.7  NEUTROABS 9.3*  --   --   --   --   --   --  HGB 10.0*   < > 8.7* 7.6* 8.1* 7.9* 8.0*  HCT 31.5*   < > 26.8* 24.4* 26.5* 24.9* 25.1*  MCV 97.2   < > 96.4 97.6 98.1 96.9 95.4  PLT 172   < > 162 146* 184 171 198   < > = values in this interval not displayed.   Basic Metabolic Panel: Recent Labs  Lab 08/01/21 0510 08/02/21 0352 08/03/21 0338 08/04/21 0552 08/04/21 1222 08/05/21 0540  NA 141 144 145  --  141 140  K 3.6 3.9 3.9  --  3.6 3.7  CL 110 112* 111  --  108 106  CO2 '25 27 26  '$ --  27 28  GLUCOSE 211* 158* 119*  --  237* 124*  BUN 35* 41* 36*  --  25* 22   CREATININE 1.48* 1.30* 1.24*  --  1.21* 0.91  CALCIUM 9.1 9.4 9.4  --  9.5 9.6  MG 2.4 2.6* 2.1 1.8  --  1.7  PHOS 4.4 3.6 3.0 2.8  --  3.6   GFR: Estimated Creatinine Clearance: 56 mL/min (by C-G formula based on SCr of 0.91 mg/dL). Liver Function Tests: Recent Labs  Lab 07/30/21 1803 07/31/21 0305 08/04/21 1222 08/05/21 0540  AST 20 26 176* 90*  ALT 13 18 154* 154*  ALKPHOS 39 36* 69 64  BILITOT 0.9 1.1 0.8 0.7  PROT 6.4* 6.4* 5.8* 5.7*  ALBUMIN 3.8 3.3* 2.8* 2.6*   No results for input(s): "LIPASE", "AMYLASE" in the last 168 hours. No results for input(s): "AMMONIA" in the last 168 hours. Coagulation Profile: Recent Labs  Lab 07/30/21 2100 07/31/21 0305  INR 1.1 1.2   Cardiac Enzymes: No results for input(s): "CKTOTAL", "CKMB", "CKMBINDEX", "TROPONINI" in the last 168 hours. BNP (last 3 results) No results for input(s): "PROBNP" in the last 8760 hours. HbA1C: No results for input(s): "HGBA1C" in the last 72 hours. CBG: Recent Labs  Lab 08/04/21 1532 08/04/21 1937 08/04/21 2330 08/05/21 0349 08/05/21 0738  GLUCAP 139* 243* 107* 113* 140*   Lipid Profile: No results for input(s): "CHOL", "HDL", "LDLCALC", "TRIG", "CHOLHDL", "LDLDIRECT" in the last 72 hours.  Thyroid Function Tests: No results for input(s): "TSH", "T4TOTAL", "FREET4", "T3FREE", "THYROIDAB" in the last 72 hours. Anemia Panel: No results for input(s): "VITAMINB12", "FOLATE", "FERRITIN", "TIBC", "IRON", "RETICCTPCT" in the last 72 hours. Sepsis Labs: Recent Labs  Lab 07/30/21 1803 07/30/21 2100 07/31/21 0305 08/01/21 0510 08/02/21 0352  PROCALCITON <0.10  --  34.44 40.11 27.18  LATICACIDVEN 2.2* 3.5* 4.1* 2.2*  --     Recent Results (from the past 240 hour(s))  Resp Panel by RT-PCR (Flu A&B, Covid) Anterior Nasal Swab     Status: None   Collection Time: 07/30/21  6:20 PM   Specimen: Anterior Nasal Swab  Result Value Ref Range Status   SARS Coronavirus 2 by RT PCR NEGATIVE NEGATIVE  Final    Comment: (NOTE) SARS-CoV-2 target nucleic acids are NOT DETECTED.  The SARS-CoV-2 RNA is generally detectable in upper respiratory specimens during the acute phase of infection. The lowest concentration of SARS-CoV-2 viral copies this assay can detect is 138 copies/mL. A negative result does not preclude SARS-Cov-2 infection and should not be used as the sole basis for treatment or other patient management decisions. A negative result may occur with  improper specimen collection/handling, submission of specimen other than nasopharyngeal swab, presence of viral mutation(s) within the areas targeted by this assay, and inadequate number of viral copies(<138 copies/mL). A negative  result must be combined with clinical observations, patient history, and epidemiological information. The expected result is Negative.  Fact Sheet for Patients:  EntrepreneurPulse.com.au  Fact Sheet for Healthcare Providers:  IncredibleEmployment.be  This test is no t yet approved or cleared by the [place] FDA and  has been authorized for detection and/or diagnosis of SARS-CoV-2 by FDA under an Emergency Use Authorization (EUA). This EUA will remain  in effect (meaning this test can be used) for the duration of the COVID-19 declaration under Section 564(b)(1) of the Act, 21 U.S.C.section 360bbb-3(b)(1), unless the authorization is terminated  or revoked sooner.       Influenza A by PCR NEGATIVE NEGATIVE Final   Influenza B by PCR NEGATIVE NEGATIVE Final    Comment: (NOTE) The Xpert Xpress SARS-CoV-2/FLU/RSV plus assay is intended as an aid in the diagnosis of influenza from Nasopharyngeal swab specimens and should not be used as a sole basis for treatment. Nasal washings and aspirates are unacceptable for Xpert Xpress SARS-CoV-2/FLU/RSV testing.  Fact Sheet for Patients: EntrepreneurPulse.com.au  Fact Sheet for Healthcare  Providers: IncredibleEmployment.be  This test is not yet approved or cleared by the [place] FDA and has been authorized for detection and/or diagnosis of SARS-CoV-2 by FDA under an Emergency Use Authorization (EUA). This EUA will remain in effect (meaning this test can be used) for the duration of the COVID-19 declaration under Section 564(b)(1) of the Act, 21 U.S.C. section 360bbb-3(b)(1), unless the authorization is terminated or revoked.  Performed at [facility], [address], [place], [place] [address]   Blood Culture (routine x 2)     Status: Abnormal   Collection Time: 07/30/21  6:20 PM   Specimen: BLOOD  Result Value Ref Range Status   Specimen Description   Final    BLOOD RAC Performed at [facility], [address], [place], [place] [address]    Special Requests   Final    BOTTLES DRAWN AEROBIC AND ANAEROBIC BCLV Performed at [facility], [address], [place], [place] [address]    Culture  Setup Time   Final    GRAM POSITIVE COCCI IN BOTH AEROBIC AND ANAEROBIC BOTTLES Organism ID to follow CRITICAL RESULT CALLED TO, READ BACK BY AND VERIFIED WITH: Violeta Gelinas 07/31/21 0515 MW Performed at [facility], [address] [place], [place] [address]    Culture STREPTOCOCCUS PNEUMONIAE (A)  Final   Report Status 08/02/2021 FINAL  Final   Organism ID, Bacteria STREPTOCOCCUS PNEUMONIAE  Final      Susceptibility   Streptococcus pneumoniae - MIC*    ERYTHROMYCIN >=8 RESISTANT Resistant     LEVOFLOXACIN 0.5 SENSITIVE Sensitive     VANCOMYCIN 0.25 SENSITIVE Sensitive     PENICILLIN (meningitis) 0.25 RESISTANT Resistant     PENO - penicillin 0.25      PENICILLIN (non-meningitis) 0.25 SENSITIVE Sensitive     PENICILLIN (oral) 0.25 INTERMEDIATE Intermediate     CEFTRIAXONE (non-meningitis) <=0.12 SENSITIVE Sensitive     CEFTRIAXONE (meningitis) <=0.12 SENSITIVE Sensitive     * STREPTOCOCCUS PNEUMONIAE   Urine Culture     Status: None   Collection Time: 07/30/21  6:20 PM   Specimen: Urine, Random  Result Value Ref Range Status   Specimen Description   Final    URINE, RANDOM Performed at [facility], [address] [place], [place] [address]    Special Requests   Final    NONE Performed at [facility], [address] [place], [place] [address]    Culture   Final    NO GROWTH Performed at [facility], [place] [address] [place], [place] [address]    Report Status 08/01/2021 FINAL  Final  Blood Culture ID Panel (Reflexed)     Status: Abnormal   Collection Time: 07/30/21  6:20 PM  Result Value Ref Range Status   Enterococcus faecalis NOT DETECTED NOT DETECTED Final   Enterococcus Faecium NOT DETECTED NOT DETECTED Final   Listeria monocytogenes NOT DETECTED NOT DETECTED Final   Staphylococcus species NOT DETECTED NOT DETECTED Final   Staphylococcus aureus (BCID) NOT DETECTED NOT DETECTED Final   Staphylococcus epidermidis NOT DETECTED NOT DETECTED Final   Staphylococcus lugdunensis NOT DETECTED NOT DETECTED Final   Streptococcus species DETECTED (A) NOT DETECTED Final    Comment: CRITICAL RESULT CALLED TO, READ BACK BY AND VERIFIED WITH: JASON ROBBINS 07/31/21 0515 MW    Streptococcus agalactiae NOT DETECTED NOT DETECTED Final   Streptococcus pneumoniae DETECTED (A) NOT DETECTED Final    Comment: CRITICAL RESULT CALLED TO, READ BACK BY AND VERIFIED WITH: JASON ROBBINS 07/31/21 0515 MW    Streptococcus pyogenes NOT DETECTED NOT DETECTED Final   A.calcoaceticus-baumannii NOT DETECTED NOT DETECTED Final   Bacteroides fragilis NOT DETECTED NOT DETECTED Final   Enterobacterales NOT DETECTED NOT DETECTED Final   Enterobacter cloacae complex NOT DETECTED NOT DETECTED Final   Escherichia coli NOT DETECTED NOT DETECTED Final   Klebsiella aerogenes NOT DETECTED NOT DETECTED Final   Klebsiella oxytoca NOT DETECTED NOT DETECTED Final   Klebsiella pneumoniae NOT  DETECTED NOT DETECTED Final   Proteus species NOT DETECTED NOT DETECTED Final   Salmonella species NOT DETECTED NOT DETECTED Final   Serratia marcescens NOT DETECTED NOT DETECTED Final   Haemophilus influenzae NOT DETECTED NOT DETECTED Final   Neisseria meningitidis NOT DETECTED NOT DETECTED Final   Pseudomonas aeruginosa NOT DETECTED NOT DETECTED Final   Stenotrophomonas maltophilia NOT DETECTED NOT DETECTED Final   Candida albicans NOT DETECTED NOT DETECTED Final   Candida auris NOT DETECTED NOT DETECTED Final   Candida glabrata NOT DETECTED NOT DETECTED Final   Candida krusei NOT DETECTED NOT DETECTED Final   Candida parapsilosis NOT DETECTED NOT DETECTED Final   Candida tropicalis NOT DETECTED NOT DETECTED Final   Cryptococcus neoformans/gattii NOT DETECTED NOT DETECTED Final    Comment: Performed at [facility], [address] [place], [place] [address]  Culture, blood (Routine X 2) w Reflex to ID Panel     Status: None   Collection Time: 07/30/21 11:36 PM   Specimen: BLOOD  Result Value Ref Range Status   Specimen Description BLOOD RIGHT WRIST  Final   Special Requests   Final    BOTTLES DRAWN AEROBIC ONLY Blood Culture results may not be optimal due to an inadequate volume of blood received in culture bottles   Culture   Final    NO GROWTH 5 DAYS Performed at [facility], [address], [place], [place] [address]    Report Status 08/04/2021 FINAL  Final  Culture, Respiratory w Gram Stain     Status: None   Collection Time: 07/31/21  1:34 AM   Specimen: Tracheal Aspirate; Respiratory  Result Value Ref Range Status   Specimen Description   Final    TRACHEAL ASPIRATE Performed at [facility], [address], [place], [place] [address]    Special Requests   Final    NONE Performed at [facility],  [place], [place] [address]    Gram Stain NO WBC SEEN NO ORGANISMS SEEN   Final   Culture   Final    Normal  respiratory flora-no Staph aureus or Pseudomonas seen Performed at [facility] [address], [place], [place] [address]    Report Status 08/02/2021 FINAL  Final  CSF culture w Gram Stain     Status: None   Collection Time: 07/31/21  2:42 PM   Specimen: PATH Cytology CSF; Cerebrospinal Fluid  Result Value Ref Range Status   Specimen Description   Final    CSF Performed at [facility], [address], [place], [place] [address]    Special Requests   Final    NONE Performed at [facility], [address] [place], [place] [address]    Gram Stain   Final    NO ORGANISMS SEEN WBC SEEN RED BLOOD CELLS RESULT CALLED TO, READ BACK BY AND VERIFIED WITH: EVON CONNALLY AT 6301 ON 07/31/21 BY SS Performed at [facility], [address], [place], [place] [address]    Culture   Final    NO GROWTH 3 DAYS Performed at [facility], [place] [address] [place], [place] [address]    Report Status 08/03/2021 FINAL  Final  Culture, fungus without smear     Status: None (Preliminary result)   Collection Time: 07/31/21  2:42 PM   Specimen: PATH Cytology CSF; Cerebrospinal Fluid  Result Value Ref Range Status   Specimen Description   Final    CSF Performed at [facility], [address], [place], [place] [address]    Special Requests   Final    NONE Performed at [facility], [address], [place], [place] [address]    Culture   Final    No Fungi Isolated in 4 Weeks Performed at [facility], [place] [address], [place], [place] [address]    Report Status PENDING  Incomplete  Culture, blood (Routine X 2) w Reflex to ID Panel     Status: None (Preliminary result)   Collection Time: 08/02/21  3:50 AM   Specimen: BLOOD  Result Value Ref Range Status   Specimen Description BLOOD RIGHT HAND  Final   Special Requests   Final    BOTTLES DRAWN AEROBIC ONLY Blood Culture adequate volume   Culture   Final    NO GROWTH 3  DAYS Performed at [facility], [address], [place], [place] [address]    Report Status PENDING  Incomplete  Culture, blood (Routine X 2) w Reflex to ID Panel     Status: None (Preliminary result)   Collection Time: 08/02/21  3:50 AM   Specimen: BLOOD  Result Value Ref Range Status   Specimen Description BLOOD RIGHT INDEX FINGER  Final   Special Requests IN PEDIATRIC BOTTLE Blood Culture adequate volume  Final   Culture   Final    NO GROWTH 3 DAYS Performed at [facility], [address] [place], [place] [address]    Report Status PENDING  Incomplete         Radiology Studies: MR BRAIN W WO CONTRAST  Result Date: 08/04/2021 CLINICAL DATA:  Follow-up examination for meningitis. EXAM: MRI HEAD WITHOUT AND WITH CONTRAST TECHNIQUE: Multiplanar, multiecho pulse sequences of the brain and surrounding structures were obtained without and with intravenous contrast. CONTRAST:  17m GADAVIST GADOBUTROL 1 MMOL/ML IV SOLN COMPARISON:  Previous brain  MRI from 08/02/2021. FINDINGS: Brain: Examination mildly degraded by motion. Age advanced cerebral atrophy. No evidence for acute or interval infarction. Previously seen sulcal FLAIR signal abnormality again seen involving the right temporal lobe, superior to the right temporal bone and tegmen tympani. Susceptibility artifact again seen within this region, consistent with hemorrhage, similar to previous, although an additional small focus of blood products is now seen superiorly at the right frontal region (series 13, image 39). Additional scattered sulcal FLAIR signal about the frontal parietal lobes again noted as well. Associated intermittent diffusion signal abnormality. Suspected subtle scattered leptomeningeal enhancement, although postcontrast sequences are degraded by motion. Trace diffusion signal abnormality lying within the occipital horns of the lateral ventricles likely reflect debris. Findings are consistent with known  history of meningitis. Overall, appearance is slightly diminished as compared to previous MRI, suggesting some interval response to therapy. No progressive hemorrhagic blood products. No extra-axial or intraparenchymal abscess. No other convincing evidence for concomitant ventriculitis. No mass lesion or midline shift. No hydrocephalus or extra-axial fluid collection. Markedly empty sella noted. No other abnormal enhancement. Vascular: Major intracranial vascular flow voids are maintained. Specific note made of preserved flow voids within the major dural sinuses. Skull and upper cervical spine: Bone marrow signal intensity within normal limits. No scalp soft tissue abnormality. Sinuses/Orbits: Globes and orbital soft tissues within normal limits. Changes of chronic sinusitis with sequelae of prior sinus surgery noted. Large bilateral mastoid effusions again noted. Evaluation for potential coalescence limited on this exam. Other: None. IMPRESSION: 1. Persistent findings consistent with known history of meningitis, overall slightly decreased in prominence as compared to previous MRI, suggesting partial interval response to therapy. Similar associated hemorrhage at the right temporal region. 2. Persistent large bilateral mastoid effusions. Electronically Signed   By: Jeannine Boga M.D.   On: 08/04/2021 02:07   EEG adult  Result Date: 08/03/2021 Lora Havens, MD     08/03/2021  6:36 PM Patient Name: Katanya Schlie MRN: [id number] Epilepsy Attending: Lora Havens Referring Physician/Provider: Lorenza Chick, MD Date: 08/03/2021 Duration: 24.33 mins Patient history: 76 year old doing remarkably well after meningitis.  I do suspect that her hearing loss is secondary to the meningitis and the involvement of the right temporal lobe likely explains the right arm weakness compared to the left.  However given hemorrhage as well, EEG to evaluate for seizure. Level of alertness: Awake AEDs during EEG study: None  Technical aspects: This EEG study was done with scalp electrodes positioned according to the 10-20 International system of electrode placement. Electrical activity was acquired at a sampling rate of '500Hz'$  and reviewed with a high frequency filter of '70Hz'$  and a low frequency filter of '1Hz'$ . EEG data were recorded continuously and digitally stored. Description: The posterior dominant rhythm consists of 8 Hz activity of moderate voltage (25-35 uV) seen predominantly in posterior head regions, symmetric and reactive to eye opening and eye closing. Drowsiness was characterized by attenuation of the posterior background rhythm. Hyperventilation and photic stimulation were not performed.   IMPRESSION: This study is within normal limits. No seizures or epileptiform discharges were seen throughout the recording. Priyanka [name]        Scheduled Meds:  Chlorhexidine Gluconate Cloth  6 each Topical Daily   enoxaparin (LOVENOX) injection  40 mg Subcutaneous Q24H   feeding supplement  237 mL Oral BID BM   insulin aspart  0-9 Units Subcutaneous Q4H   insulin detemir  8 Units Subcutaneous BID   multivitamin with minerals  1 tablet Oral Daily   pantoprazole (PROTONIX) IV  40 mg Intravenous Q24H   Continuous Infusions:  cefTRIAXone (ROCEPHIN)  IV Stopped (08/05/21 7034)     LOS: 6 days    Time spent: 25 mins     Wyvonnia Dusky, MD [facility] Pager [phone]  If 7PM-7AM, please contact night-coverage  08/05/2021, 8:42 AM

## 2021-08-05 NOTE — Progress Notes (Signed)
Report given to receiving nurse on 2A, [place].  Patient is in stable condition and transferred on tele monitoring.

## 2021-08-05 NOTE — Progress Notes (Signed)
Neurology Progress Note  Patient ID: Diane Patton is a 76 y.o. female who presented with fevers and encephalopathy with a past medical history significant for diabetes, hypertension, obstructive sleep apnea, COPD, CKD stage IIIb/IV, coronary artery disease, congestive heart failure, peripheral vascular disease; found to have strep pneumo meningitis with MRI showing predominantly right temporal involvement with petechial hemorrhage, course complicated by severe worsening of her hearing   Subjective: -Reports she feels much better -PT notes from 6/16 indicate patient was confused yesterday with elevated blood pressure -She did receive hydralazine IV 230 3:30 PM, Zofran at 1700, but did not receive tramadol until later in the day (1647, 20-20, and 550 this morning) -Tmax of 101.3 at 1700 yesterday, infectious disease noted 6/15 that there were concerned that she was still spiking fever on 6/14  Exam: Vitals:   08/05/21 0800 08/05/21 0916  BP: (!) 152/71 (!) 170/71  Pulse:  91  Resp: (!) 22 19  Temp: 98.5 F (36.9 C) 98.5 F (36.9 C)  SpO2: 98% 99%   Gen: In bed, comfortable, eating applesauce though she struggled to open the lid Resp: non-labored breathing, no grossly audible wheezing Cardiac: Perfusing extremities well  Abd: soft, nt  Neuro: MS: Awake, alert, oriented to self, situation, place, day of the week, month, year, appropriately interactive CN: Remains very hard of hearing, face is symmetric, tongue midline Motor: Mild pronation of the right upper extremity without drift this morning, no drift of the left upper extremity Sensory: Intact to light touch in bilateral arms   Pertinent Labs:  Basic Metabolic Panel: Recent Labs  Lab 08/01/21 0510 08/02/21 0352 08/03/21 0338 08/04/21 0552 08/04/21 1222 08/05/21 0540  NA 141 144 145  --  141 140  K 3.6 3.9 3.9  --  3.6 3.7  CL 110 112* 111  --  108 106  CO2 '25 27 26  '$ --  27 28  GLUCOSE 211* 158* 119*  --  237* 124*   BUN 35* 41* 36*  --  25* 22  CREATININE 1.48* 1.30* 1.24*  --  1.21* 0.91  CALCIUM 9.1 9.4 9.4  --  9.5 9.6  MG 2.4 2.6* 2.1 1.8  --  1.7  PHOS 4.4 3.6 3.0 2.8  --  3.6    CBC: Recent Labs  Lab 07/30/21 1803 07/31/21 0305 08/01/21 0510 08/02/21 0352 08/03/21 0338 08/04/21 1222 08/05/21 0540  WBC 11.3*   < > 11.7* 9.3 9.9 8.7 8.7  NEUTROABS 9.3*  --   --   --   --   --   --   HGB 10.0*   < > 8.7* 7.6* 8.1* 7.9* 8.0*  HCT 31.5*   < > 26.8* 24.4* 26.5* 24.9* 25.1*  MCV 97.2   < > 96.4 97.6 98.1 96.9 95.4  PLT 172   < > 162 146* 184 171 198   < > = values in this interval not displayed.    Coagulation Studies: No results for input(s): "LABPROT", "INR" in the last 72 hours.    6/15 MRI brain with and without contrast personally reviewed, agree with radiology read: 1. Persistent findings consistent with known history of meningitis, overall slightly decreased in prominence as compared to previous MRI, suggesting partial interval response to therapy. Similar associated hemorrhage at the right temporal region. 2. Persistent large bilateral mastoid effusions.  Impression: On my clinical evaluation patient appears to be continuing to recover well from her meningitis.  However I am concerned about her reports of intermittent confusion,  which could reflect delirium although seizure activity remains on the differential especially given her right temporal lobe petechial hemorrhage.  Reassuringly, her left-sided weakness appears to be improving (resolution of pronator drift in the LUE), therefore I will continue to monitor  Recommendations: -Discussed with nursing at bedside to notify neurology for fluctuations in mental status -I will hold off on antiseizure medications at this time, but I have a low threshold to repeat EEG / add antiseizure medications -Appreciate ID continuing to follow for her intermittent fevers -Neurology will continue to follow  [place] [phone]

## 2021-08-05 NOTE — Progress Notes (Signed)
Revaluated orientation. Patient remains alert and oriented x4. She is fully aware of self, time, place and reason why she is in hospital.  Appropriate conversations/responses with staff. Clear speech, remains afebrile at this time. Will continue to monitor closely.

## 2021-08-05 NOTE — Plan of Care (Signed)
Continuing with plan of care. 

## 2021-08-06 DIAGNOSIS — G001 Pneumococcal meningitis: Secondary | ICD-10-CM | POA: Diagnosis not present

## 2021-08-06 DIAGNOSIS — R7401 Elevation of levels of liver transaminase levels: Secondary | ICD-10-CM | POA: Diagnosis not present

## 2021-08-06 DIAGNOSIS — R7881 Bacteremia: Secondary | ICD-10-CM | POA: Diagnosis not present

## 2021-08-06 DIAGNOSIS — G009 Bacterial meningitis, unspecified: Secondary | ICD-10-CM | POA: Diagnosis not present

## 2021-08-06 LAB — COMPREHENSIVE METABOLIC PANEL
ALT: 114 U/L — ABNORMAL HIGH (ref 0–44)
AST: 43 U/L — ABNORMAL HIGH (ref 15–41)
Albumin: 2.7 g/dL — ABNORMAL LOW (ref 3.5–5.0)
Alkaline Phosphatase: 61 U/L (ref 38–126)
Anion gap: 7 (ref 5–15)
BUN: 18 mg/dL (ref 8–23)
CO2: 28 mmol/L (ref 22–32)
Calcium: 9.6 mg/dL (ref 8.9–10.3)
Chloride: 105 mmol/L (ref 98–111)
Creatinine, Ser: 1.02 mg/dL — ABNORMAL HIGH (ref 0.44–1.00)
GFR, Estimated: 57 mL/min — ABNORMAL LOW (ref >60)
Glucose, Bld: 102 mg/dL — ABNORMAL HIGH (ref 70–99)
Potassium: 3.8 mmol/L (ref 3.5–5.1)
Sodium: 140 mmol/L (ref 135–145)
Total Bilirubin: 0.6 mg/dL (ref 0.3–1.2)
Total Protein: 6.2 g/dL — ABNORMAL LOW (ref 6.5–8.1)

## 2021-08-06 LAB — CBC
HCT: 25.7 % — ABNORMAL LOW (ref 36.0–46.0)
Hemoglobin: 8.1 g/dL — ABNORMAL LOW (ref 12.0–15.0)
MCH: 30.3 pg (ref 26.0–34.0)
MCHC: 31.5 g/dL (ref 30.0–36.0)
MCV: 96.3 fL (ref 80.0–100.0)
Platelets: 218 10*3/uL (ref 150–400)
RBC: 2.67 MIL/uL — ABNORMAL LOW (ref 3.87–5.11)
RDW: 12.8 % (ref 11.5–15.5)
WBC: 9.4 10*3/uL (ref 4.0–10.5)
nRBC: 0.2 % (ref 0.0–0.2)

## 2021-08-06 LAB — GLUCOSE, CAPILLARY
Glucose-Capillary: 115 mg/dL — ABNORMAL HIGH (ref 70–99)
Glucose-Capillary: 121 mg/dL — ABNORMAL HIGH (ref 70–99)
Glucose-Capillary: 123 mg/dL — ABNORMAL HIGH (ref 70–99)
Glucose-Capillary: 136 mg/dL — ABNORMAL HIGH (ref 70–99)
Glucose-Capillary: 137 mg/dL — ABNORMAL HIGH (ref 70–99)
Glucose-Capillary: 140 mg/dL — ABNORMAL HIGH (ref 70–99)
Glucose-Capillary: 192 mg/dL — ABNORMAL HIGH (ref 70–99)
Glucose-Capillary: 96 mg/dL (ref 70–99)
Glucose-Capillary: 99 mg/dL (ref 70–99)

## 2021-08-06 LAB — MAGNESIUM: Magnesium: 1.7 mg/dL (ref 1.7–2.4)

## 2021-08-06 LAB — PHOSPHORUS: Phosphorus: 4.1 mg/dL (ref 2.5–4.6)

## 2021-08-06 MED ORDER — SODIUM CHLORIDE 0.9 % IV SOLN: INTRAVENOUS | Status: DC | PRN

## 2021-08-06 MED ORDER — PANTOPRAZOLE SODIUM 40 MG PO TBEC
40.0000 mg | DELAYED_RELEASE_TABLET | Freq: Every day | ORAL | Status: DC
Start: 2021-08-07 — End: 2021-08-11
  Administered 2021-08-07 – 2021-08-11 (×5): 40 mg via ORAL
  Filled 2021-08-06 (×5): qty 1

## 2021-08-06 NOTE — Plan of Care (Signed)

## 2021-08-06 NOTE — TOC Initial Note (Signed)
Transition of Care [facility]) - Initial/Assessment Note    Patient Details  Name: Diane Patton MRN: [id number] Date of Birth: May 15, 1945  Transition of [facility]) CM/SW Contact:    Harriet Masson, RN Phone Number:[phone] 08/06/2021, 4:57 PM  Clinical Narrative:                 Recommended SNF: Spoke with pt initial who agreed however aware her spouse [name]) wants pt to come home in his care. Pt decided not to make the decision and requested RN to contact her spouse [name]) to make the decision for her. She has indicated if her spouse request her to come home she would accept [facility] if her spouse once again agrees with these services.  RN attempted to call spouse and the numbers noted in Epic and unable to lvm however a cb was received but was disconnected. RN unable to connect with spouse for pending discharged disposition.  Pt further reports she takes transportation services Lucianne Lei) to all her medical appointments and she and her son and spouse live in a house with only one step for entry. Pt able to obtain all her medications and receives assistance from her grandson.  Pt has a cane and RW for mobility.  Will reports for TOC to follow up accordingly with possible SNF or [facility] for this pt.  Note is SNF to be considered PASSR# [id number]  Expected Discharge Plan: [place] Barriers to Discharge: Continued Medical Work up   Patient Goals and CMS Choice     Choice offered to / list presented to : Patient  Expected Discharge Plan and Services Expected Discharge Plan: [place]   Discharge Planning Services: CM Consult   Living arrangements for the past 2 months: Single Family Home                                      Prior Living Arrangements/Services Living arrangements for the past 2 months: Single Family Home Lives with:: Adult Children, Spouse Patient language and need for interpreter reviewed:: Yes        Need for Family  Participation in Patient Care: Yes (Comment) Care giver support system in place?: Yes (comment) Current home services: DME Criminal Activity/Legal Involvement Pertinent to Current Situation/Hospitalization: No - Comment as needed  Activities of Daily Living      Permission Sought/Granted   Permission granted to share information with : Yes, Verbal Permission Granted           Permission granted to share info w Contact Information: spouse [name])  Emotional Assessment Appearance:: Appears stated age Attitude/Demeanor/Rapport: Engaged Affect (typically observed): Accepting Orientation: : Oriented to Self, Oriented to Place, Oriented to  Time, Oriented to Situation Alcohol / Substance Use: Not Applicable Psych Involvement: No (comment)  Admission diagnosis:  Sepsis ([place]) [A41.9] Patient Active Problem List   Diagnosis Date Noted   Cardiomyopathy, nonischemic ([place])    Sepsis ([place])    Stage 3b chronic kidney disease ([place])    Acute metabolic encephalopathy    Acute respiratory failure with hypoxia ([facility])    Anemia in chronic kidney disease 08/25/2020   IDA (iron deficiency anemia) 04/05/2020   Gastroenteritis 10/31/2018   Acute gastroenteritis 10/30/2018   Sleep apnea 01/19/2016   Insomnia 03/21/2015   Cough 11/01/2014   Diabetes mellitus, type 2 ([place]) 09/29/2014   Chronic obstructive pulmonary disease ([place]) 38/93/7342   Chronic systolic heart  failure ([place]) 06/09/2014   HYPERLIPIDEMIA-MIXED 12/16/2008   Essential hypertension 12/16/2008   PCP:  Tracie Harrier, MD Pharmacy:   [facility] [address]), [place] [address] [place]) [place] [address] Phone: [phone] Fax: [phone]     Social Determinants of Health (SDOH) Interventions    Readmission Risk Interventions     No data to display

## 2021-08-06 NOTE — Progress Notes (Signed)
Neurology Progress Note  Patient ID: Diane Patton is a 76 y.o. female who presented with fevers and encephalopathy with a past medical history significant for diabetes, hypertension, obstructive sleep apnea, COPD, CKD stage IIIb/IV, coronary artery disease, congestive heart failure, peripheral vascular disease; found to have strep pneumo meningitis with MRI showing predominantly right temporal involvement with petechial hemorrhage, course complicated by severe worsening of her hearing   Subjective: -No complaints this morning, sleeping but awakens easily -No further indication that she has had waxing/waning mental status -Fever curve improved, afebrile in the last 24 hours  Exam: Current vital signs: BP (!) 153/71 (BP Location: Right Arm)   Pulse 88   Temp 97.9 F (36.6 C)   Resp 17   Ht 5' 5.98" (1.676 m)   Wt 80.5 kg   LMP 06/20/1967 (Approximate)   SpO2 100%   BMI 28.66 kg/m  Vital signs in last 24 hours: Temp:  [97.8 F (36.6 C)-99.5 F (37.5 C)] 97.9 F (36.6 C) (06/18 0807) Pulse Rate:  [88-94] 88 (06/18 0807) Resp:  [17-19] 17 (06/18 0807) BP: (142-163)/(63-77) 153/71 (06/18 0807) SpO2:  [98 %-100 %] 100 % (06/18 0807) Weight:  [80.5 kg] 80.5 kg (06/18 0300)   Gen: In bed, comfortable, eating applesauce though she struggled to open the lid Resp: non-labored breathing, no grossly audible wheezing Cardiac: Perfusing extremities well  Abd: soft, nt  Neuro: MS: Awake, alert, oriented to self, situation, place, day of the week, month, year, appropriately interactive CN: Remains very hard of hearing, face is symmetric, tongue midline, EOMI, orients to stimuli in all visual fields Motor: Mild pronation of bilateral upper extremities this morning without drift.  Symmetrically weak in bilateral lower extremities, struggles to lift them off the bed Sensory: Intact to light touch in bilateral arms   Pertinent Labs:  Basic Metabolic Panel: Recent Labs  Lab 08/02/21 0352  08/03/21 0338 08/04/21 0552 08/04/21 1222 08/05/21 0540 08/06/21 0504  NA 144 145  --  141 140 140  K 3.9 3.9  --  3.6 3.7 3.8  CL 112* 111  --  108 106 105  CO2 27 26  --  '27 28 28  '$ GLUCOSE 158* 119*  --  237* 124* 102*  BUN 41* 36*  --  25* 22 18  CREATININE 1.30* 1.24*  --  1.21* 0.91 1.02*  CALCIUM 9.4 9.4  --  9.5 9.6 9.6  MG 2.6* 2.1 1.8  --  1.7 1.7  PHOS 3.6 3.0 2.8  --  3.6 4.1     CBC: Recent Labs  Lab 07/30/21 1803 07/31/21 0305 08/02/21 0352 08/03/21 0338 08/04/21 1222 08/05/21 0540 08/06/21 0504  WBC 11.3*   < > 9.3 9.9 8.7 8.7 9.4  NEUTROABS 9.3*  --   --   --   --   --   --   HGB 10.0*   < > 7.6* 8.1* 7.9* 8.0* 8.1*  HCT 31.5*   < > 24.4* 26.5* 24.9* 25.1* 25.7*  MCV 97.2   < > 97.6 98.1 96.9 95.4 96.3  PLT 172   < > 146* 184 171 198 218   < > = values in this interval not displayed.     Coagulation Studies: No results for input(s): "LABPROT", "INR" in the last 72 hours.    6/15 MRI brain with and without contrast personally reviewed, agree with radiology read: 1. Persistent findings consistent with known history of meningitis, overall slightly decreased in prominence as compared to previous MRI,  suggesting partial interval response to therapy. Similar associated hemorrhage at the right temporal region. 2. Persistent large bilateral mastoid effusions.  Impression: On my clinical evaluation patient appears to be continuing to recover well from her meningitis.  No further reports of intermittent confusion, and her examination remains stable/improving  Recommendations: -Discussed with nursing at bedside to notify neurology for fluctuations in mental status -I will hold off on antiseizure medications at this time, but would have a low threshold to repeat EEG / add potentially add antiseizure medications if there is any concern for seizure going forward -Patient should be referred for outpatient neurology follow-up, [place] [facility] versus [place]  [facility] as patient/family prefers -Neurology will be available on an as-needed basis going forward, please reach out if any questions or concerns arise  Lesleigh Noe MD-PhD [facility] [phone]   Greater than 35 minutes were spent in care of this patient today, greater than 50% at bedside

## 2021-08-06 NOTE — Progress Notes (Signed)
PROGRESS NOTE    Diane Patton  KKX:[id number] DOB: 1945/03/07 DOA: 07/30/2021 PCP: Tracie Harrier, MD   Assessment & Plan:   Principal Problem:   Sepsis [facility]) Active Problems:   Acute metabolic encephalopathy   Acute respiratory failure with hypoxia ([place])   Chronic systolic heart failure ([place])   Essential hypertension   Diabetes mellitus, type 2 (HCC)   Chronic obstructive pulmonary disease (HCC)   Cardiomyopathy, nonischemic (HCC)   Anemia in chronic kidney disease   Stage 3b chronic kidney disease ([place])  Assessment and Plan: Severe acute hypoxic & hypercapnic respiratory: resolved. Currently on RA   Septic shock: see Dr. Zoila Shutter note on how pt meet septic shock criteria. Secondary to bacterial meningitis, strep pneumonia bacteremia. Off of pressors. 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 Hard of hearing is not new for the pt and was present prior to admission to the hospital as per pt's son. MRI brain showed persistent findings consistent w/ hx of meningitis, similar associated hemorrhage at the right temporal region, persistent large b/l mastoid effusions. Afebrile x 24 hrs   Bacteremia: blood cxs growing strep pneumonia. Continue on IV rocephin as per ID. Repeat blood cxs NGTD  Right temporal hemorrhage: EEG was WNL, no seizures or epileptiform dischages were seen. Neuro following and recs   Acute toxic encephalopathy: secondary to bacterial meningitis. Mental status is close to baseline   AKI: Cr is labile. Avoid nephrotoxic meds    Transaminitis: etiology unclear. AST, ALT are still elevated but trending down   Normocytic anemia: H&H are stable. No need for a transfusion currently       DVT prophylaxis: lovenox  Code Status: full  Family Communication:   Disposition Plan: likely d/c to SNF  Level of care: Progressive  Status is: Inpatient Remains inpatient appropriate because: severity of illness, needs SNF placement     Consultants:  ICU ID Neuro   Procedures:   Antimicrobials: rocephin   Subjective: Pt c/o fatigue    Objective: Vitals:   08/05/21 2015 08/06/21 0059 08/06/21 0300 08/06/21 0500  BP: (!) 151/74 (!) 154/67  (!) 142/77  Pulse: 94 93  93  Resp: 18   18  Temp: 99.5 F (37.5 C) 98.5 F (36.9 C)    TempSrc:  Oral    SpO2: 99% 98%  100%  Weight:   80.5 kg   Height:        Intake/Output Summary (Last 24 hours) at 08/06/2021 0802 Last data filed at 08/06/2021 0417 Gross per 24 hour  Intake 165.93 ml  Output 1250 ml  Net -1084.07 ml   Filed Weights   07/30/21 2259 08/05/21 0500 08/06/21 0300  Weight: 79.4 kg 82.4 kg 80.5 kg    Examination:  General exam: Appears calm & comfortable. Hard of hearing  Respiratory system: clear breath sounds b/l. No wheezes, rales  Cardiovascular system: S1 & S2+. No rubs or clicks  Gastrointestinal system: Abd is soft, NT, obese & hypoactive bowel sounds  Central nervous system: Alert & oriented. Moves all extremities  Psychiatry: judgement and insight appears at baseline. Flat mood and affect     Data Reviewed: I have personally reviewed following labs and imaging studies  CBC: Recent Labs  Lab 07/30/21 1803 07/31/21 0305 08/02/21 0352 08/03/21 0338 08/04/21 1222 08/05/21 0540 08/06/21 0504  WBC 11.3*   < > 9.3 9.9 8.7 8.7 9.4  NEUTROABS 9.3*  --   --   --   --   --   --  HGB 10.0*   < > 7.6* 8.1* 7.9* 8.0* 8.1*  HCT 31.5*   < > 24.4* 26.5* 24.9* 25.1* 25.7*  MCV 97.2   < > 97.6 98.1 96.9 95.4 96.3  PLT 172   < > 146* 184 171 198 218   < > = values in this interval not displayed.   Basic Metabolic Panel: Recent Labs  Lab 08/02/21 0352 08/03/21 0338 08/04/21 0552 08/04/21 1222 08/05/21 0540 08/06/21 0504  NA 144 145  --  141 140 140  K 3.9 3.9  --  3.6 3.7 3.8  CL 112* 111  --  108 106 105  CO2 27 26  --  '27 28 28  '$ GLUCOSE 158* 119*  --  237* 124* 102*  BUN 41* 36*  --  25* 22 18  CREATININE 1.30* 1.24*  --   1.21* 0.91 1.02*  CALCIUM 9.4 9.4  --  9.5 9.6 9.6  MG 2.6* 2.1 1.8  --  1.7 1.7  PHOS 3.6 3.0 2.8  --  3.6 4.1   GFR: Estimated Creatinine Clearance: 49.4 mL/min (A) (by C-G formula based on SCr of 1.02 mg/dL (H)). Liver Function Tests: Recent Labs  Lab 07/30/21 1803 07/31/21 0305 08/04/21 1222 08/05/21 0540 08/06/21 0504  AST 20 26 176* 90* 43*  ALT 13 18 154* 154* 114*  ALKPHOS 39 36* 69 64 61  BILITOT 0.9 1.1 0.8 0.7 0.6  PROT 6.4* 6.4* 5.8* 5.7* 6.2*  ALBUMIN 3.8 3.3* 2.8* 2.6* 2.7*   No results for input(s): "LIPASE", "AMYLASE" in the last 168 hours. No results for input(s): "AMMONIA" in the last 168 hours. Coagulation Profile: Recent Labs  Lab 07/30/21 2100 07/31/21 0305  INR 1.1 1.2   Cardiac Enzymes: No results for input(s): "CKTOTAL", "CKMB", "CKMBINDEX", "TROPONINI" in the last 168 hours. BNP (last 3 results) No results for input(s): "PROBNP" in the last 8760 hours. HbA1C: No results for input(s): "HGBA1C" in the last 72 hours. CBG: Recent Labs  Lab 08/05/21 2014 08/06/21 0002 08/06/21 0056 08/06/21 0416 08/06/21 0736  GLUCAP 202* 121* 99 96 137*   Lipid Profile: No results for input(s): "CHOL", "HDL", "LDLCALC", "TRIG", "CHOLHDL", "LDLDIRECT" in the last 72 hours.  Thyroid Function Tests: No results for input(s): "TSH", "T4TOTAL", "FREET4", "T3FREE", "THYROIDAB" in the last 72 hours. Anemia Panel: No results for input(s): "VITAMINB12", "FOLATE", "FERRITIN", "TIBC", "IRON", "RETICCTPCT" in the last 72 hours. Sepsis Labs: Recent Labs  Lab 07/30/21 1803 07/30/21 2100 07/31/21 0305 08/01/21 0510 08/02/21 0352  PROCALCITON <0.10  --  34.44 40.11 27.18  LATICACIDVEN 2.2* 3.5* 4.1* 2.2*  --     Recent Results (from the past 240 hour(s))  Resp Panel by RT-PCR (Flu A&B, Covid) Anterior Nasal Swab     Status: None   Collection Time: 07/30/21  6:20 PM   Specimen: Anterior Nasal Swab  Result Value Ref Range Status   SARS Coronavirus 2 by RT PCR  NEGATIVE NEGATIVE Final    Comment: (NOTE) SARS-CoV-2 target nucleic acids are NOT DETECTED.  The SARS-CoV-2 RNA is generally detectable in upper respiratory specimens during the acute phase of infection. The lowest concentration of SARS-CoV-2 viral copies this assay can detect is 138 copies/mL. A negative result does not preclude SARS-Cov-2 infection and should not be used as the sole basis for treatment or other patient management decisions. A negative result may occur with  improper specimen collection/handling, submission of specimen other than nasopharyngeal swab, presence of viral mutation(s) within the areas targeted by this  assay, and inadequate number of viral copies(<138 copies/mL). A negative result must be combined with clinical observations, patient history, and epidemiological information. The expected result is Negative.  Fact Sheet for Patients:  EntrepreneurPulse.com.au  Fact Sheet for Healthcare Providers:  IncredibleEmployment.be  This test is no t yet approved or cleared by the [place] FDA and  has been authorized for detection and/or diagnosis of SARS-CoV-2 by FDA under an Emergency Use Authorization (EUA). This EUA will remain  in effect (meaning this test can be used) for the duration of the COVID-19 declaration under Section 564(b)(1) of the Act, 21 U.S.C.section 360bbb-3(b)(1), unless the authorization is terminated  or revoked sooner.       Influenza A by PCR NEGATIVE NEGATIVE Final   Influenza B by PCR NEGATIVE NEGATIVE Final    Comment: (NOTE) The Xpert Xpress SARS-CoV-2/FLU/RSV plus assay is intended as an aid in the diagnosis of influenza from Nasopharyngeal swab specimens and should not be used as a sole basis for treatment. Nasal washings and aspirates are unacceptable for Xpert Xpress SARS-CoV-2/FLU/RSV testing.  Fact Sheet for Patients: EntrepreneurPulse.com.au  Fact Sheet for  Healthcare Providers: IncredibleEmployment.be  This test is not yet approved or cleared by the [place] FDA and has been authorized for detection and/or diagnosis of SARS-CoV-2 by FDA under an Emergency Use Authorization (EUA). This EUA will remain in effect (meaning this test can be used) for the duration of the COVID-19 declaration under Section 564(b)(1) of the Act, 21 U.S.C. section 360bbb-3(b)(1), unless the authorization is terminated or revoked.  Performed at [facility], [address] [place], [place] [address]   Blood Culture (routine x 2)     Status: Abnormal   Collection Time: 07/30/21  6:20 PM   Specimen: BLOOD  Result Value Ref Range Status   Specimen Description   Final    BLOOD RAC Performed at [facility], [address] [place], [place] [address]    Special Requests   Final    BOTTLES DRAWN AEROBIC AND ANAEROBIC BCLV Performed at [facility], [address], [place], [place] [address]    Culture  Setup Time   Final    GRAM POSITIVE COCCI IN BOTH AEROBIC AND ANAEROBIC BOTTLES Organism ID to follow CRITICAL RESULT CALLED TO, READ BACK BY AND VERIFIED WITH: Violeta Gelinas 07/31/21 0515 MW Performed at [facility], [place] [place], [place] [address]    Culture STREPTOCOCCUS PNEUMONIAE (A)  Final   Report Status 08/02/2021 FINAL  Final   Organism ID, Bacteria STREPTOCOCCUS PNEUMONIAE  Final      Susceptibility   Streptococcus pneumoniae - MIC*    ERYTHROMYCIN >=8 RESISTANT Resistant     LEVOFLOXACIN 0.5 SENSITIVE Sensitive     VANCOMYCIN 0.25 SENSITIVE Sensitive     PENICILLIN (meningitis) 0.25 RESISTANT Resistant     PENO - penicillin 0.25      PENICILLIN (non-meningitis) 0.25 SENSITIVE Sensitive     PENICILLIN (oral) 0.25 INTERMEDIATE Intermediate     CEFTRIAXONE (non-meningitis) <=0.12 SENSITIVE Sensitive     CEFTRIAXONE (meningitis) <=0.12 SENSITIVE Sensitive     * STREPTOCOCCUS  PNEUMONIAE  Urine Culture     Status: None   Collection Time: 07/30/21  6:20 PM   Specimen: Urine, Random  Result Value Ref Range Status   Specimen Description   Final    URINE, RANDOM Performed at [facility], [address] [place], [place] [address]    Special Requests   Final  NONE Performed at [facility], [address], [place], [place] [address]    Culture   Final    NO GROWTH Performed at [facility], [place] [address] [place], [place] [address]    Report Status 08/01/2021 FINAL  Final  Blood Culture ID Panel (Reflexed)     Status: Abnormal   Collection Time: 07/30/21  6:20 PM  Result Value Ref Range Status   Enterococcus faecalis NOT DETECTED NOT DETECTED Final   Enterococcus Faecium NOT DETECTED NOT DETECTED Final   Listeria monocytogenes NOT DETECTED NOT DETECTED Final   Staphylococcus species NOT DETECTED NOT DETECTED Final   Staphylococcus aureus (BCID) NOT DETECTED NOT DETECTED Final   Staphylococcus epidermidis NOT DETECTED NOT DETECTED Final   Staphylococcus lugdunensis NOT DETECTED NOT DETECTED Final   Streptococcus species DETECTED (A) NOT DETECTED Final    Comment: CRITICAL RESULT CALLED TO, READ BACK BY AND VERIFIED WITH: JASON ROBBINS 07/31/21 0515 MW    Streptococcus agalactiae NOT DETECTED NOT DETECTED Final   Streptococcus pneumoniae DETECTED (A) NOT DETECTED Final    Comment: CRITICAL RESULT CALLED TO, READ BACK BY AND VERIFIED WITH: JASON ROBBINS 07/31/21 0515 MW    Streptococcus pyogenes NOT DETECTED NOT DETECTED Final   A.calcoaceticus-baumannii NOT DETECTED NOT DETECTED Final   Bacteroides fragilis NOT DETECTED NOT DETECTED Final   Enterobacterales NOT DETECTED NOT DETECTED Final   Enterobacter cloacae complex NOT DETECTED NOT DETECTED Final   Escherichia coli NOT DETECTED NOT DETECTED Final   Klebsiella aerogenes NOT DETECTED NOT DETECTED Final   Klebsiella oxytoca NOT DETECTED NOT DETECTED Final   Klebsiella  pneumoniae NOT DETECTED NOT DETECTED Final   Proteus species NOT DETECTED NOT DETECTED Final   Salmonella species NOT DETECTED NOT DETECTED Final   Serratia marcescens NOT DETECTED NOT DETECTED Final   Haemophilus influenzae NOT DETECTED NOT DETECTED Final   Neisseria meningitidis NOT DETECTED NOT DETECTED Final   Pseudomonas aeruginosa NOT DETECTED NOT DETECTED Final   Stenotrophomonas maltophilia NOT DETECTED NOT DETECTED Final   Candida albicans NOT DETECTED NOT DETECTED Final   Candida auris NOT DETECTED NOT DETECTED Final   Candida glabrata NOT DETECTED NOT DETECTED Final   Candida krusei NOT DETECTED NOT DETECTED Final   Candida parapsilosis NOT DETECTED NOT DETECTED Final   Candida tropicalis NOT DETECTED NOT DETECTED Final   Cryptococcus neoformans/gattii NOT DETECTED NOT DETECTED Final    Comment: Performed at [facility], [place] [place], [place] [address]  Culture, blood (Routine X 2) w Reflex to ID Panel     Status: None   Collection Time: 07/30/21 11:36 PM   Specimen: BLOOD  Result Value Ref Range Status   Specimen Description BLOOD RIGHT WRIST  Final   Special Requests   Final    BOTTLES DRAWN AEROBIC ONLY Blood Culture results may not be optimal due to an inadequate volume of blood received in culture bottles   Culture   Final    NO GROWTH 5 DAYS Performed at [facility], [address], [place], [place] [address]    Report Status 08/04/2021 FINAL  Final  Culture, Respiratory w Gram Stain     Status: None   Collection Time: 07/31/21  1:34 AM   Specimen: Tracheal Aspirate; Respiratory  Result Value Ref Range Status   Specimen Description   Final    TRACHEAL ASPIRATE Performed at [facility], [address] [place], [place] [address]    Special Requests   Final  NONE Performed at [facility], [place], [place] [address]    Gram Stain NO WBC SEEN NO ORGANISMS SEEN   Final   Culture   Final     Normal respiratory flora-no Staph aureus or Pseudomonas seen Performed at [place] [address], [place], [place] [address]    Report Status 08/02/2021 FINAL  Final  CSF culture w Gram Stain     Status: None   Collection Time: 07/31/21  2:42 PM   Specimen: PATH Cytology CSF; Cerebrospinal Fluid  Result Value Ref Range Status   Specimen Description   Final    CSF Performed at [facility], [address], [place], [place] [address]    Special Requests   Final    NONE Performed at [facility], [place] [place], [place] [address]    Gram Stain   Final    NO ORGANISMS SEEN WBC SEEN RED BLOOD CELLS RESULT CALLED TO, READ BACK BY AND VERIFIED WITH: EVON CONNALLY AT 7858 ON 07/31/21 BY SS Performed at [facility], [address], [place], [place] [address]    Culture   Final    NO GROWTH 3 DAYS Performed at [facility], [place] [address] [place], [place] [address]    Report Status 08/03/2021 FINAL  Final  Culture, fungus without smear     Status: None (Preliminary result)   Collection Time: 07/31/21  2:42 PM   Specimen: PATH Cytology CSF; Cerebrospinal Fluid  Result Value Ref Range Status   Specimen Description   Final    CSF Performed at [facility], [address], [place], [place] [address]    Special Requests   Final    NONE Performed at [facility] [address] [place], [place] [address]    Culture   Final    NO FUNGUS ISOLATED AFTER 5 DAYS Performed at [facility], [place] [address] [place], [place] [address]    Report Status PENDING  Incomplete  Culture, blood (Routine X 2) w Reflex to ID Panel     Status: None (Preliminary result)   Collection Time: 08/02/21  3:50 AM   Specimen: BLOOD  Result Value Ref Range Status   Specimen Description BLOOD RIGHT HAND  Final   Special Requests   Final    BOTTLES DRAWN AEROBIC ONLY Blood Culture adequate volume   Culture   Final    NO GROWTH  4 DAYS Performed at [facility], [address], [place], [place] [address]    Report Status PENDING  Incomplete  Culture, blood (Routine X 2) w Reflex to ID Panel     Status: None (Preliminary result)   Collection Time: 08/02/21  3:50 AM   Specimen: BLOOD  Result Value Ref Range Status   Specimen Description BLOOD RIGHT INDEX FINGER  Final   Special Requests IN PEDIATRIC BOTTLE Blood Culture adequate volume  Final   Culture   Final    NO GROWTH 4 DAYS Performed at [facility], [address], [place], [place] [address]    Report Status PENDING  Incomplete         Radiology Studies: No results found.      Scheduled Meds:  Chlorhexidine Gluconate Cloth  6 each Topical Daily   enoxaparin (LOVENOX) injection  40 mg Subcutaneous Q24H   feeding supplement  237 mL Oral BID BM   insulin aspart  0-9 Units Subcutaneous Q4H  insulin detemir  8 Units Subcutaneous BID   multivitamin with minerals  1 tablet Oral Daily   pantoprazole (PROTONIX) IV  40 mg Intravenous Q24H   Continuous Infusions:  sodium chloride 10 mL/hr at 08/06/21 0731   cefTRIAXone (ROCEPHIN)  IV 2 g (08/06/21 0732)     LOS: 7 days    Time spent: 25 mins     Wyvonnia Dusky, MD [facility] Pager [phone]  If 7PM-7AM, please contact night-coverage  08/06/2021, 8:02 AM

## 2021-08-07 ENCOUNTER — Inpatient Hospital Stay: Payer: Medicare Other

## 2021-08-07 ENCOUNTER — Inpatient Hospital Stay: Payer: Medicare Other | Admitting: Oncology

## 2021-08-07 DIAGNOSIS — G009 Bacterial meningitis, unspecified: Secondary | ICD-10-CM | POA: Diagnosis not present

## 2021-08-07 DIAGNOSIS — R7881 Bacteremia: Secondary | ICD-10-CM | POA: Diagnosis not present

## 2021-08-07 DIAGNOSIS — B953 Streptococcus pneumoniae as the cause of diseases classified elsewhere: Secondary | ICD-10-CM | POA: Diagnosis not present

## 2021-08-07 DIAGNOSIS — R7401 Elevation of levels of liver transaminase levels: Secondary | ICD-10-CM | POA: Diagnosis not present

## 2021-08-07 LAB — COMPREHENSIVE METABOLIC PANEL
ALT: 79 U/L — ABNORMAL HIGH (ref 0–44)
AST: 23 U/L (ref 15–41)
Albumin: 2.9 g/dL — ABNORMAL LOW (ref 3.5–5.0)
Alkaline Phosphatase: 60 U/L (ref 38–126)
Anion gap: 7 (ref 5–15)
BUN: 13 mg/dL (ref 8–23)
CO2: 27 mmol/L (ref 22–32)
Calcium: 9.7 mg/dL (ref 8.9–10.3)
Chloride: 103 mmol/L (ref 98–111)
Creatinine, Ser: 0.97 mg/dL (ref 0.44–1.00)
GFR, Estimated: >60 mL/min (ref >60)
Glucose, Bld: 131 mg/dL — ABNORMAL HIGH (ref 70–99)
Potassium: 4 mmol/L (ref 3.5–5.1)
Sodium: 137 mmol/L (ref 135–145)
Total Bilirubin: 0.6 mg/dL (ref 0.3–1.2)
Total Protein: 6.4 g/dL — ABNORMAL LOW (ref 6.5–8.1)

## 2021-08-07 LAB — CBC
HCT: 25.9 % — ABNORMAL LOW (ref 36.0–46.0)
Hemoglobin: 8.3 g/dL — ABNORMAL LOW (ref 12.0–15.0)
MCH: 30.5 pg (ref 26.0–34.0)
MCHC: 32 g/dL (ref 30.0–36.0)
MCV: 95.2 fL (ref 80.0–100.0)
Platelets: 278 10*3/uL (ref 150–400)
RBC: 2.72 MIL/uL — ABNORMAL LOW (ref 3.87–5.11)
RDW: 12.8 % (ref 11.5–15.5)
WBC: 12.1 10*3/uL — ABNORMAL HIGH (ref 4.0–10.5)
nRBC: 0.2 % (ref 0.0–0.2)

## 2021-08-07 LAB — MAGNESIUM: Magnesium: 1.7 mg/dL (ref 1.7–2.4)

## 2021-08-07 LAB — GLUCOSE, CAPILLARY
Glucose-Capillary: 100 mg/dL — ABNORMAL HIGH (ref 70–99)
Glucose-Capillary: 120 mg/dL — ABNORMAL HIGH (ref 70–99)
Glucose-Capillary: 179 mg/dL — ABNORMAL HIGH (ref 70–99)
Glucose-Capillary: 112 mg/dL — ABNORMAL HIGH (ref 70–99)
Glucose-Capillary: 165 mg/dL — ABNORMAL HIGH (ref 70–99)

## 2021-08-07 LAB — PHOSPHORUS: Phosphorus: 4 mg/dL (ref 2.5–4.6)

## 2021-08-07 MED ORDER — INSULIN ASPART 100 UNIT/ML IJ SOLN
0.0000 [IU] | Freq: Three times a day (TID) | INTRAMUSCULAR | Status: DC
  Administered 2021-08-08 (×2): 2 [IU] via SUBCUTANEOUS
  Administered 2021-08-09 – 2021-08-11 (×3): 1 [IU] via SUBCUTANEOUS
  Filled 2021-08-07 (×4): qty 1

## 2021-08-07 MED ORDER — INSULIN ASPART 100 UNIT/ML IJ SOLN
0.0000 [IU] | Freq: Every day | INTRAMUSCULAR | Status: DC
  Administered 2021-08-09: 2 [IU] via SUBCUTANEOUS
  Filled 2021-08-07: qty 1

## 2021-08-07 MED ORDER — INSULIN ASPART 100 UNIT/ML IJ SOLN: 0.0000 [IU] | Freq: Three times a day (TID) | INTRAMUSCULAR | Status: DC

## 2021-08-07 NOTE — Progress Notes (Signed)
PROGRESS NOTE    Diane Patton  LZJ:[id number] DOB: [birth year] DOA: 07/30/2021 PCP: Tracie Harrier, MD   Assessment & Plan:   Principal Problem:   Sepsis [facility]) Active Problems:   Acute metabolic encephalopathy   Acute respiratory failure with hypoxia ([place])   Chronic systolic heart failure ([place])   Essential hypertension   Diabetes mellitus, type 2 (HCC)   Chronic obstructive pulmonary disease (HCC)   Cardiomyopathy, nonischemic (HCC)   Anemia in chronic kidney disease   Stage 3b chronic kidney disease ([place])  Assessment and Plan: Severe acute hypoxic & hypercapnic respiratory: resolved. Currently on RA    Septic shock: see Dr. Zoila Shutter note on how pt meet septic shock criteria. Secondary to bacterial meningitis, strep pneumonia bacteremia. Off of pressors. 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 Hard of hearing is not new for the pt and was present prior to admission to the hospital as per pt's son. MRI brain showed persistent findings consistent w/ hx of meningitis, similar associated hemorrhage at the right temporal region, persistent large b/l mastoid effusions. Afebrile x 48 hrs   Bacteremia: blood cxs growing strep pneumonia. Repeat blood cxs NGTD. Continue on IV rocephin   Right temporal hemorrhage: EEG was WNL, no seizures or epileptiform dischages were seen. Neuro following and recs   Acute toxic encephalopathy: secondary to bacterial meningitis. Mental status is close to baseline   AKI: Cr is trending down today. Avoid nephrotoxic meds     Transaminitis: etiology unclear. AST is WNL. ALT is still elevated but trending down   Normocytic anemia: H&H are stable. Will transfuse if Hb < 7.0        DVT prophylaxis: lovenox  Code Status: full  Family Communication:  discussed pt's care w/ pt's husband, [name], and answered his questions. [name] is now agreeable to pt going to SNF  Disposition Plan: likely d/c to SNF  Level of care:  Progressive  Status is: Inpatient Remains inpatient appropriate because: severity of illness, needs SNF placement    Consultants:  ICU ID Neuro   Procedures:   Antimicrobials: rocephin   Subjective: Pt c/o malaise   Objective: Vitals:   08/06/21 2344 08/07/21 0038 08/07/21 0425 08/07/21 0500  BP: 96/84 138/72 (!) 154/83   Pulse: 94 92 (!) 102   Resp: 16  16   Temp: 99.2 F (37.3 C)  98.9 F (37.2 C)   TempSrc: Oral  Oral   SpO2: 99%  93%   Weight:    80.1 kg  Height:        Intake/Output Summary (Last 24 hours) at 08/07/2021 0752 Last data filed at 08/07/2021 0500 Gross per 24 hour  Intake 74.64 ml  Output 2075 ml  Net -2000.36 ml   Filed Weights   08/05/21 0500 08/06/21 0300 08/07/21 0500  Weight: 82.4 kg 80.5 kg 80.1 kg    Examination:  General exam: Appears comfortable. Hard of hearing  Respiratory system: clear breath sounds b/l. No wheezes, rales  Cardiovascular system: S1/S2+. No rubs or gallops  Gastrointestinal system: abd is soft, NT, ND & hypoactive bowel sounds  Central nervous system: alert & oriented. Moves all extremities Psychiatry: judgement and insight appear at baseline. Flat mood and affect     Data Reviewed: I have personally reviewed following labs and imaging studies  CBC: Recent Labs  Lab 08/03/21 0338 08/04/21 1222 08/05/21 0540 08/06/21 0504 08/07/21 0644  WBC 9.9 8.7 8.7 9.4 12.1*  HGB 8.1* 7.9* 8.0* 8.1*  8.3*  HCT 26.5* 24.9* 25.1* 25.7* 25.9*  MCV 98.1 96.9 95.4 96.3 95.2  PLT 184 171 198 218 034   Basic Metabolic Panel: Recent Labs  Lab 08/03/21 0338 08/04/21 0552 08/04/21 1222 08/05/21 0540 08/06/21 0504 08/07/21 0644  NA 145  --  141 140 140 137  K 3.9  --  3.6 3.7 3.8 4.0  CL 111  --  108 106 105 103  CO2 26  --  '27 28 28 27  '$ GLUCOSE 119*  --  237* 124* 102* 131*  BUN 36*  --  25* '22 18 13  '$ CREATININE 1.24*  --  1.21* 0.91 1.02* 0.97  CALCIUM 9.4  --  9.5 9.6 9.6 9.7  MG 2.1 1.8  --  1.7 1.7 1.7   PHOS 3.0 2.8  --  3.6 4.1 4.0   GFR: Estimated Creatinine Clearance: 51.8 mL/min (by C-G formula based on SCr of 0.97 mg/dL). Liver Function Tests: Recent Labs  Lab 08/04/21 1222 08/05/21 0540 08/06/21 0504 08/07/21 0644  AST 176* 90* 43* 23  ALT 154* 154* 114* 79*  ALKPHOS 69 64 61 60  BILITOT 0.8 0.7 0.6 0.6  PROT 5.8* 5.7* 6.2* 6.4*  ALBUMIN 2.8* 2.6* 2.7* 2.9*   No results for input(s): "LIPASE", "AMYLASE" in the last 168 hours. No results for input(s): "AMMONIA" in the last 168 hours. Coagulation Profile: No results for input(s): "INR", "PROTIME" in the last 168 hours.  Cardiac Enzymes: No results for input(s): "CKTOTAL", "CKMB", "CKMBINDEX", "TROPONINI" in the last 168 hours. BNP (last 3 results) No results for input(s): "PROBNP" in the last 8760 hours. HbA1C: No results for input(s): "HGBA1C" in the last 72 hours. CBG: Recent Labs  Lab 08/06/21 1150 08/06/21 1617 08/06/21 2034 08/06/21 2339 08/07/21 0413  GLUCAP 140* 192* 136* 123* 100*   Lipid Profile: No results for input(s): "CHOL", "HDL", "LDLCALC", "TRIG", "CHOLHDL", "LDLDIRECT" in the last 72 hours.  Thyroid Function Tests: No results for input(s): "TSH", "T4TOTAL", "FREET4", "T3FREE", "THYROIDAB" in the last 72 hours. Anemia Panel: No results for input(s): "VITAMINB12", "FOLATE", "FERRITIN", "TIBC", "IRON", "RETICCTPCT" in the last 72 hours. Sepsis Labs: Recent Labs  Lab 08/01/21 0510 08/02/21 0352  PROCALCITON 40.11 27.18  LATICACIDVEN 2.2*  --     Recent Results (from the past 240 hour(s))  Resp Panel by RT-PCR (Flu A&B, Covid) Anterior Nasal Swab     Status: None   Collection Time: 07/30/21  6:20 PM   Specimen: Anterior Nasal Swab  Result Value Ref Range Status   SARS Coronavirus 2 by RT PCR NEGATIVE NEGATIVE Final    Comment: (NOTE) SARS-CoV-2 target nucleic acids are NOT DETECTED.  The SARS-CoV-2 RNA is generally detectable in upper respiratory specimens during the acute phase of  infection. The lowest concentration of SARS-CoV-2 viral copies this assay can detect is 138 copies/mL. A negative result does not preclude SARS-Cov-2 infection and should not be used as the sole basis for treatment or other patient management decisions. A negative result may occur with  improper specimen collection/handling, submission of specimen other than nasopharyngeal swab, presence of viral mutation(s) within the areas targeted by this assay, and inadequate number of viral copies(<138 copies/mL). A negative result must be combined with clinical observations, patient history, and epidemiological information. The expected result is Negative.  Fact Sheet for Patients:  EntrepreneurPulse.com.au  Fact Sheet for Healthcare Providers:  IncredibleEmployment.be  This test is no t yet approved or cleared by the [place] and  has been authorized  for detection and/or diagnosis of SARS-CoV-2 by FDA under an Emergency Use Authorization (EUA). This EUA will remain  in effect (meaning this test can be used) for the duration of the COVID-19 declaration under Section 564(b)(1) of the Act, 21 U.S.C.section 360bbb-3(b)(1), unless the authorization is terminated  or revoked sooner.       Influenza A by PCR NEGATIVE NEGATIVE Final   Influenza B by PCR NEGATIVE NEGATIVE Final    Comment: (NOTE) The Xpert Xpress SARS-CoV-2/FLU/RSV plus assay is intended as an aid in the diagnosis of influenza from Nasopharyngeal swab specimens and should not be used as a sole basis for treatment. Nasal washings and aspirates are unacceptable for Xpert Xpress SARS-CoV-2/FLU/RSV testing.  Fact Sheet for Patients: EntrepreneurPulse.com.au  Fact Sheet for Healthcare Providers: IncredibleEmployment.be  This test is not yet approved or cleared by the [place] FDA and has been authorized for detection and/or diagnosis of SARS-CoV-2  by FDA under an Emergency Use Authorization (EUA). This EUA will remain in effect (meaning this test can be used) for the duration of the COVID-19 declaration under Section 564(b)(1) of the Act, 21 U.S.C. section 360bbb-3(b)(1), unless the authorization is terminated or revoked.  Performed at [facility], [address], [place], [place] [address]   Blood Culture (routine x 2)     Status: Abnormal   Collection Time: 07/30/21  6:20 PM   Specimen: BLOOD  Result Value Ref Range Status   Specimen Description   Final    BLOOD RAC Performed at [facility], [address], [place], [place] [address]    Special Requests   Final    BOTTLES DRAWN AEROBIC AND ANAEROBIC BCLV Performed at [facility], [address], [place], [place] [address]    Culture  Setup Time   Final    GRAM POSITIVE COCCI IN BOTH AEROBIC AND ANAEROBIC BOTTLES Organism ID to follow CRITICAL RESULT CALLED TO, READ BACK BY AND VERIFIED WITH: Violeta Gelinas 07/31/21 0515 MW Performed at [facility], [address] [place], [place] [address]    Culture STREPTOCOCCUS PNEUMONIAE (A)  Final   Report Status 08/02/2021 FINAL  Final   Organism ID, Bacteria STREPTOCOCCUS PNEUMONIAE  Final      Susceptibility   Streptococcus pneumoniae - MIC*    ERYTHROMYCIN >=8 RESISTANT Resistant     LEVOFLOXACIN 0.5 SENSITIVE Sensitive     VANCOMYCIN 0.25 SENSITIVE Sensitive     PENICILLIN (meningitis) 0.25 RESISTANT Resistant     PENO - penicillin 0.25      PENICILLIN (non-meningitis) 0.25 SENSITIVE Sensitive     PENICILLIN (oral) 0.25 INTERMEDIATE Intermediate     CEFTRIAXONE (non-meningitis) <=0.12 SENSITIVE Sensitive     CEFTRIAXONE (meningitis) <=0.12 SENSITIVE Sensitive     * STREPTOCOCCUS PNEUMONIAE  Urine Culture     Status: None   Collection Time: 07/30/21  6:20 PM   Specimen: Urine, Random  Result Value Ref Range Status   Specimen Description   Final    URINE, RANDOM Performed at  [facility], [address], [place], [place] [address]    Special Requests   Final    NONE Performed at [facility], [address] [place], [place] [address]    Culture   Final    NO GROWTH Performed at [facility], [place] [address] [place], [place] [address]    Report Status 08/01/2021 FINAL  Final  Blood Culture ID Panel (Reflexed)     Status: Abnormal  Collection Time: 07/30/21  6:20 PM  Result Value Ref Range Status   Enterococcus faecalis NOT DETECTED NOT DETECTED Final   Enterococcus Faecium NOT DETECTED NOT DETECTED Final   Listeria monocytogenes NOT DETECTED NOT DETECTED Final   Staphylococcus species NOT DETECTED NOT DETECTED Final   Staphylococcus aureus (BCID) NOT DETECTED NOT DETECTED Final   Staphylococcus epidermidis NOT DETECTED NOT DETECTED Final   Staphylococcus lugdunensis NOT DETECTED NOT DETECTED Final   Streptococcus species DETECTED (A) NOT DETECTED Final    Comment: CRITICAL RESULT CALLED TO, READ BACK BY AND VERIFIED WITH: JASON ROBBINS 07/31/21 0515 MW    Streptococcus agalactiae NOT DETECTED NOT DETECTED Final   Streptococcus pneumoniae DETECTED (A) NOT DETECTED Final    Comment: CRITICAL RESULT CALLED TO, READ BACK BY AND VERIFIED WITH: JASON ROBBINS 07/31/21 0515 MW    Streptococcus pyogenes NOT DETECTED NOT DETECTED Final   A.calcoaceticus-baumannii NOT DETECTED NOT DETECTED Final   Bacteroides fragilis NOT DETECTED NOT DETECTED Final   Enterobacterales NOT DETECTED NOT DETECTED Final   Enterobacter cloacae complex NOT DETECTED NOT DETECTED Final   Escherichia coli NOT DETECTED NOT DETECTED Final   Klebsiella aerogenes NOT DETECTED NOT DETECTED Final   Klebsiella oxytoca NOT DETECTED NOT DETECTED Final   Klebsiella pneumoniae NOT DETECTED NOT DETECTED Final   Proteus species NOT DETECTED NOT DETECTED Final   Salmonella species NOT DETECTED NOT DETECTED Final   Serratia marcescens NOT DETECTED NOT DETECTED Final    Haemophilus influenzae NOT DETECTED NOT DETECTED Final   Neisseria meningitidis NOT DETECTED NOT DETECTED Final   Pseudomonas aeruginosa NOT DETECTED NOT DETECTED Final   Stenotrophomonas maltophilia NOT DETECTED NOT DETECTED Final   Candida albicans NOT DETECTED NOT DETECTED Final   Candida auris NOT DETECTED NOT DETECTED Final   Candida glabrata NOT DETECTED NOT DETECTED Final   Candida krusei NOT DETECTED NOT DETECTED Final   Candida parapsilosis NOT DETECTED NOT DETECTED Final   Candida tropicalis NOT DETECTED NOT DETECTED Final   Cryptococcus neoformans/gattii NOT DETECTED NOT DETECTED Final    Comment: Performed at [facility], [address] [place], [place] [address]  Culture, blood (Routine X 2) w Reflex to ID Panel     Status: None   Collection Time: 07/30/21 11:36 PM   Specimen: BLOOD  Result Value Ref Range Status   Specimen Description BLOOD RIGHT WRIST  Final   Special Requests   Final    BOTTLES DRAWN AEROBIC ONLY Blood Culture results may not be optimal due to an inadequate volume of blood received in culture bottles   Culture   Final    NO GROWTH 5 DAYS Performed at [facility], [address] [place], [place] [address]    Report Status 08/04/2021 FINAL  Final  Culture, Respiratory w Gram Stain     Status: None   Collection Time: 07/31/21  1:34 AM   Specimen: Tracheal Aspirate; Respiratory  Result Value Ref Range Status   Specimen Description   Final    TRACHEAL ASPIRATE Performed at [facility], [address] [place], [place] [address]    Special Requests   Final    NONE Performed at [facility], [place], [place] [address]    Gram Stain NO WBC SEEN NO ORGANISMS SEEN   Final   Culture   Final    Normal respiratory flora-no Staph aureus or Pseudomonas seen Performed at [facility] [address], [place], [place] [address]  Report Status 08/02/2021 FINAL  Final  CSF culture w Gram  Stain     Status: None   Collection Time: 07/31/21  2:42 PM   Specimen: PATH Cytology CSF; Cerebrospinal Fluid  Result Value Ref Range Status   Specimen Description   Final    CSF Performed at [facility], [address] [place], [place] [address]    Special Requests   Final    NONE Performed at [facility], [address] [place], [place] [address]    Gram Stain   Final    NO ORGANISMS SEEN WBC SEEN RED BLOOD CELLS RESULT CALLED TO, READ BACK BY AND VERIFIED WITH: EVON CONNALLY AT 9381 ON 07/31/21 BY SS Performed at [facility], [address] [place], [place] [address]    Culture   Final    NO GROWTH 3 DAYS Performed at [facility], [place] [address], [place], [place] [address]    Report Status 08/03/2021 FINAL  Final  Culture, fungus without smear     Status: None (Preliminary result)   Collection Time: 07/31/21  2:42 PM   Specimen: PATH Cytology CSF; Cerebrospinal Fluid  Result Value Ref Range Status   Specimen Description   Final    CSF Performed at [facility], [address], [place], [place] [address]    Special Requests   Final    NONE Performed at [facility], [address], [place], [place] [address]    Culture   Final    NO FUNGUS ISOLATED AFTER 6 DAYS Performed at [facility], [place] [address] [place], [place] [address]    Report Status PENDING  Incomplete  Culture, blood (Routine X 2) w Reflex to ID Panel     Status: None (Preliminary result)   Collection Time: 08/02/21  3:50 AM   Specimen: BLOOD  Result Value Ref Range Status   Specimen Description BLOOD RIGHT HAND  Final   Special Requests   Final    BOTTLES DRAWN AEROBIC ONLY Blood Culture adequate volume   Culture   Final    NO GROWTH 4 DAYS Performed at [facility], [address], [place], [place] [address]    Report Status PENDING  Incomplete  Culture, blood (Routine X 2) w Reflex to ID Panel     Status: None  (Preliminary result)   Collection Time: 08/02/21  3:50 AM   Specimen: BLOOD  Result Value Ref Range Status   Specimen Description BLOOD RIGHT INDEX FINGER  Final   Special Requests IN PEDIATRIC BOTTLE Blood Culture adequate volume  Final   Culture   Final    NO GROWTH 4 DAYS Performed at [facility], [address] [place], [place] [address]    Report Status PENDING  Incomplete         Radiology Studies: No results found.      Scheduled Meds:  Chlorhexidine Gluconate Cloth  6 each Topical Daily   enoxaparin (LOVENOX) injection  40 mg Subcutaneous Q24H   feeding supplement  237 mL Oral BID BM   insulin aspart  0-9 Units Subcutaneous Q4H   insulin detemir  8 Units Subcutaneous BID   multivitamin with minerals  1 tablet Oral Daily   pantoprazole  40 mg Oral Daily   Continuous Infusions:  sodium chloride Stopped (08/06/21 1546)   cefTRIAXone (ROCEPHIN)  IV 2 g (08/07/21 0640)     LOS: 8 days    Time spent: 25  mins     Wyvonnia Dusky, MD [facility] Pager [phone]  If 7PM-7AM, please contact night-coverage  08/07/2021, 7:52 AM

## 2021-08-07 NOTE — Progress Notes (Signed)
   08/07/21 1711  Assess: MEWS Score  Temp (!) 102.3 F (39.1 C)  BP (!) 166/77  MAP (mmHg) 102  Pulse Rate 79  Resp 18  SpO2 99 %  O2 Device Room Air  Assess: MEWS Score  MEWS Temp 2  MEWS Systolic 0  MEWS Pulse 0  MEWS RR 0  MEWS LOC 0  MEWS Score 2  MEWS Score Color Yellow  Assess: if the MEWS score is Yellow or Red  Were vital signs taken at a resting state? Yes  Focused Assessment No change from prior assessment  Treat  MEWS Interventions Administered prn meds/treatments  Pain Scale 0-10  Pain Score 4  Pain Type Acute pain  Pain Location Knee  Pain Orientation Left;Right  Pain Descriptors / Indicators Sharp  Pain Frequency Constant  Pain Onset On-going  Take Vital Signs  Increase Vital Sign Frequency  Yellow: Q 2hr X 2 then Q 4hr X 2, if remains yellow, continue Q 4hrs  Escalate  MEWS: Escalate Yellow: discuss with charge nurse/RN and consider discussing with provider and RRT  Notify: Charge Nurse/RN  Name of Charge Nurse/RN Notified Ashly RN  Date Charge Nurse/RN Notified 08/07/21  Time Charge Nurse/RN Notified 1711  Notify: Provider  Provider Name/Title Jimmye Norman MD Almetta Lovely, MD)  Date Provider Notified 08/07/21  Time Provider Notified 1711  Method of Notification Page  Notification Reason Change in status (yellow mews, temp 102.3)  Provider response See new orders  Date of Provider Response 08/07/21  Time of Provider Response 1711  Assess: SIRS CRITERIA  SIRS Temperature  1  SIRS Pulse 0  SIRS Respirations  0  SIRS WBC 0  SIRS Score Sum  1

## 2021-08-07 NOTE — Plan of Care (Signed)

## 2021-08-07 NOTE — TOC Progression Note (Addendum)
Transition of Care [facility]) - Progression Note    Patient Details  Name: Miho Monda MRN: [id number] Date of Birth: September 23, 1945  Transition [facility] CM/SW [place], [place] Phone Number: 08/07/2021, 10:10 AM  Clinical Narrative:     Update: patient agreeable to snf fl2 done faxed out pending bed offers   CSW spoke with patient's spouse [name] who reports he understands patient wishes to go home however he's on the fence about this. Reports he would like to see how she does today with Physical Therapy and go from there.   CSW has reached out to PT for updates on how patient does today with session for updated recommendations.   Expected Discharge Plan: [place] Barriers to Discharge: Continued Medical Work up  Expected Discharge Plan and Services Expected Discharge Plan: [place]   Discharge Planning Services: CM Consult   Living arrangements for the past 2 months: Single Family Home                                       Social Determinants of Health (SDOH) Interventions    Readmission Risk Interventions     No data to display

## 2021-08-07 NOTE — NC FL2 (Signed)
[place] LEVEL OF CARE SCREENING TOOL     IDENTIFICATION  Patient Name: Diane Patton Birthdate: Aug 06, 1945 Sex: female Admission Date (Current Location): 07/30/2021  [facility] and [place] Number:  Engineering geologist and Address:  [facility], [address], [place], [place] [address]      Provider Number: [id number]  Attending Physician Name and Address:  Wyvonnia Dusky, MD  Relative Name and Phone Number:  [name] (spouse) [phone]    Current Level of Care: Hospital Recommended Level of Care: [place] Prior Approval Number:    Date Approved/Denied:   PASRR Number: [id number]  Discharge Plan: SNF    Current Diagnoses: Patient Active Problem List   Diagnosis Date Noted   Cardiomyopathy, nonischemic ([place])    Sepsis ([place])    Stage 3b chronic kidney disease ([place])    Acute metabolic encephalopathy    Acute respiratory failure with hypoxia ([place])    Anemia in chronic kidney disease 08/25/2020   IDA (iron deficiency anemia) 04/05/2020   Gastroenteritis 10/31/2018   Acute gastroenteritis 10/30/2018   Sleep apnea 01/19/2016   Insomnia 03/21/2015   Cough 11/01/2014   Diabetes mellitus, type 2 ([place]) 09/29/2014   Chronic obstructive pulmonary disease ([place]) 33/35/4562   Chronic systolic heart failure ([place]) 06/09/2014   HYPERLIPIDEMIA-MIXED 12/16/2008   Essential hypertension 12/16/2008    Orientation RESPIRATION BLADDER Height & Weight     Self, Time, Situation, Place  Normal Incontinent, External catheter Weight: 176 lb 9.4 oz (80.1 kg) Height:  5' 5.98" (167.6 cm)  BEHAVIORAL SYMPTOMS/MOOD NEUROLOGICAL BOWEL NUTRITION STATUS      Incontinent Diet (see discharge summary)  AMBULATORY STATUS COMMUNICATION OF NEEDS Skin   Limited Assist Verbally Normal                       Personal Care Assistance Level of Assistance  Bathing, Feeding, Dressing, Total care Bathing Assistance: Limited  assistance Feeding assistance: Limited assistance Dressing Assistance: Limited assistance Total Care Assistance: Limited assistance   Functional Limitations Info  Sight, Hearing, Speech Sight Info: Impaired Hearing Info: Impaired Speech Info: Adequate    SPECIAL CARE FACTORS FREQUENCY  PT (By licensed PT), OT (By licensed OT)     PT Frequency: min 4x weekly OT Frequency: min 4x weekly            Contractures Contractures Info: Not present    Additional Factors Info  Code Status, Allergies Code Status Info: full Allergies Info: Amoxicillin   Aspirin   Celebrex (Celecoxib)   Latex   Penicillins   Salicylates   Allegra (Fexofenadine)           Current Medications (08/07/2021):  This is the current hospital active medication list Current Facility-Administered Medications  Medication Dose Route Frequency Provider Last Rate Last Admin   0.9 %  sodium chloride infusion   Intravenous PRN Wyvonnia Dusky, MD   Stopped at 08/06/21 1546   acetaminophen (TYLENOL) tablet 650 mg  650 mg Oral Q6H PRN Athena Masse, MD   650 mg at 08/07/21 5638   Or   acetaminophen (TYLENOL) suppository 650 mg  650 mg Rectal Q6H PRN Athena Masse, MD       cefTRIAXone (ROCEPHIN) 2 g in sodium chloride 0.9 % 100 mL IVPB  2 g Intravenous Q12H Judd Gaudier V, MD 200 mL/hr at 08/07/21 0640 2 g at 08/07/21 0640   Chlorhexidine Gluconate Cloth 2 % PADS 6 each  6  each Topical Daily Flora Lipps, MD   6 each at 08/06/21 0821   docusate sodium (COLACE) capsule 100 mg  100 mg Oral BID PRN Benita Gutter, RPH       enoxaparin (LOVENOX) injection 40 mg  40 mg Subcutaneous Q24H Kasa, Kurian, MD   40 mg at 08/06/21 2200   feeding supplement (ENSURE ENLIVE / ENSURE PLUS) liquid 237 mL  237 mL Oral BID BM Flora Lipps, MD   237 mL at 08/07/21 0808   hydrALAZINE (APRESOLINE) injection 10-20 mg  10-20 mg Intravenous Q4H PRN Teressa Lower, NP   10 mg at 08/04/21 1538   insulin aspart (novoLOG) injection 0-9  Units  0-9 Units Subcutaneous Q4H Teressa Lower, NP   2 Units at 08/07/21 1218   insulin detemir (LEVEMIR) injection 8 Units  8 Units Subcutaneous BID Rust-Chester, Britton L, NP   8 Units at 08/07/21 0808   ipratropium-albuterol (DUONEB) 0.5-2.5 (3) MG/3ML nebulizer solution 3 mL  3 mL Nebulization TID PRN Wyvonnia Dusky, MD       labetalol (NORMODYNE) injection 10 mg  10 mg Intravenous Q2H PRN Rust-Chester, Huel Cote, NP   10 mg at 07/31/21 0044   multivitamin with minerals tablet 1 tablet  1 tablet Oral Daily Flora Lipps, MD   1 tablet at 08/06/21 0815   ondansetron (ZOFRAN) tablet 4 mg  4 mg Oral Q6H PRN Athena Masse, MD       Or   ondansetron [facility]) injection 4 mg  4 mg Intravenous Q6H PRN Athena Masse, MD   4 mg at 08/04/21 1703   Oral care mouth rinse  15 mL Mouth Rinse PRN Flora Lipps, MD       pantoprazole (PROTONIX) EC tablet 40 mg  40 mg Oral Daily Oswald Hillock, RPH   40 mg at 08/07/21 0808   polyethylene glycol (MIRALAX / GLYCOLAX) packet 17 g  17 g Oral Daily PRN Benita Gutter, RPH       traMADol Veatrice Bourbon) tablet 50 mg  50 mg Oral Q6H PRN Wyvonnia Dusky, MD   50 mg at 08/07/21 1240     Discharge Medications: Please see discharge summary for a list of discharge medications.  Relevant Imaging Results:  Relevant Lab Results:   Additional Information [id number]  Alberteen Sam, LCSW

## 2021-08-07 NOTE — Progress Notes (Signed)
ID C/o pain rt knee Restricted mobility Had fever of 102 but she did  not feel hot or cold or had chills   Oe/ awake  Follows commands BP (!) 166/77 (BP Location: Right Arm)   Pulse 79   Temp (!) 102.3 F (39.1 C)   Resp 18   Ht 5' 5.98" (1.676 m)   Wt 80.1 kg   LMP 06/20/1967 (Approximate)   SpO2 99%   BMI 28.52 kg/m   Chest b/l air entry HS tachycardia Abd soft, no tenderness or pain Skin no lesions Rt knee swollen- fluctuant Painfully restricted flexion Left knee minimally swollen  Labs    Latest Ref Rng & Units 08/07/2021    6:44 AM 08/06/2021    5:04 AM 08/05/2021    5:40 AM  CBC  WBC 4.0 - 10.5 K/uL 12.1  9.4  8.7   Hemoglobin 12.0 - 15.0 g/dL 8.3  8.1  8.0   Hematocrit 36.0 - 46.0 % 25.9  25.7  25.1   Platelets 150 - 400 K/uL 278  218  198        Latest Ref Rng & Units 08/07/2021    6:44 AM 08/06/2021    5:04 AM 08/05/2021    5:40 AM  CMP  Glucose 70 - 99 mg/dL 131  102  124   BUN 8 - 23 mg/dL '13  18  22   '$ Creatinine 0.44 - 1.00 mg/dL 0.97  1.02  0.91   Sodium 135 - 145 mmol/L 137  140  140   Potassium 3.5 - 5.1 mmol/L 4.0  3.8  3.7   Chloride 98 - 111 mmol/L 103  105  106   CO2 22 - 32 mmol/L '27  28  28   '$ Calcium 8.9 - 10.3 mg/dL 9.7  9.6  9.6   Total Protein 6.5 - 8.1 g/dL 6.4  6.2  5.7   Total Bilirubin 0.3 - 1.2 mg/dL 0.6  0.6  0.7   Alkaline Phos 38 - 126 U/L 60  61  64   AST 15 - 41 U/L 23  43  90   ALT 0 - 44 U/L 79  114  154     Micro 07/30/21 BC - strep pneumo 08/02/21- BC NG so far Csf culture NG Csf cell count- 9646 frank pus Protein 425   Impression /recommendation Strep pneumoniae bacteremia with purulent meningitis Eventhough csf culture was neg this is meningitis due to strep pneumo Pt had MRI without contrast and it showed Hyperintensity in the rt temporal lobe including above mastoid. Hemorrhage to the rt temporal area MRI with contrast marked empty sella, large b/l mastoid effusions. Films not optimal to assess for coalescence (  did they mean dehiscence?)  As fever intermittent Recommend ENT consult to assess abnormal mastoid MRI and whether it needs to be drained  Pt is on IV ceftriaxone 2 grams Q 12  day 5- may need upto 14-21 days if she  has sinusitis, mastoiditis    Rt knee effusion- r/o CPPD VS septic arthritis- needs aspiration with fluid sent for crystals, culture and cellcount This could cause fever as well  Remove central line  AKI- improving  Discussed the management with her nurse and hospitalist

## 2021-08-07 NOTE — Progress Notes (Signed)
Physical Therapy Treatment Patient Details Name: Diane Patton MRN: [id number] DOB: 06-01-1945 Today's Date: 08/07/2021   History of Present Illness Diane Patton is a 44yoF who comes [facility] on 07/[facility] with AMS at home. Pt admitted in sepsis of unknown origin. PMH: sCHF, NICM, NIDDM2, HTN, COPD, CKD3b, anemia, OSA. Pt required mechanical ventilation through 6/14, extuabted to 4L. ID following for bacterial meningitis.    PT Comments    Pt in bed, sleepy, lunch arrived, pt disinterest. Pt now out of ICU and on 2A, does not recall working with Pryor Curia earlier in admission, not surprising given her AMS. A bit more lucid this date, hearing also seem substantially improved but pt still HOH. Pt's knees are remarkably stiff today and extraordinarily painful with creates big limitations will all types of mobility in session. Pt assisted with ROM exercises at EOB, but pain improves only marginally. Left knee is the most limiting and painful. Attempts to stand reveal the high degrees of weakness and deconditioning while being ill. PLOF is still not known in great detail, however author attempts to call SO and other contacts, unable to complete a call on any of these lines, nor able to leave a VM. Pt remains very weak, has significant rehab and care needs at DC for ADL, unlikely these needs can be met at home in her current condition, will need a STR stay to rehabilitate to the max.       Recommendations for follow up therapy are one component of a multi-disciplinary discharge planning process, led by the attending physician.  Recommendations may be updated based on patient status, additional functional criteria and insurance authorization.  Follow Up Recommendations  Skilled nursing-short term rehab (<3 hours/day)     Assistance Recommended at Discharge Frequent or constant Supervision/Assistance  Patient can return home with the following Two people to help with bathing/dressing/bathroom;Two people to help  with walking and/or transfers;Help with stairs or ramp for entrance   Equipment Recommendations  None recommended by PT    Recommendations for Other Services       Precautions / Restrictions Precautions Precautions: Fall Restrictions Weight Bearing Restrictions: No     Mobility  Bed Mobility Overal bed mobility: Needs Assistance Bed Mobility: Supine to Sit, Sit to Supine     Supine to sit: Mod assist Sit to supine: Total assist   General bed mobility comments: pt is very weak, but her bilat knee rigidity is very painful today and very limiting    Transfers Overall transfer level: Needs assistance Equipment used: Rolling walker (2 wheels) Transfers: Sit to/from Stand Sit to Stand: Max assist, From elevated surface           General transfer comment: provided maxA from elevation, nowhere near standing up, stopped due to pain and safety concerns    Ambulation/Gait                   Stairs             Wheelchair Mobility    Modified Rankin (Stroke Patients Only)       Balance                                            Cognition Arousal/Alertness: Awake/alert Behavior During Therapy: Flat affect  Exercises General Exercises - Lower Extremity Long Arc Quad: AAROM, Both, 10 reps, Seated, Limitations Long Arc Quad Limitations: ROM is extremely limited due to severe pains (~70-45 degrees)    General Comments        Pertinent Vitals/Pain Pain Assessment Pain Assessment: Faces Faces Pain Scale: Hurts whole lot Pain Location: bilat knees with certain movements, Left > Right    Home Living                          Prior Function            PT Goals (current goals can now be found in the care plan section) Acute Rehab PT Goals PT Goal Formulation: With patient Progress towards PT goals: Progressing toward goals    Frequency    Min  2X/week      PT Plan Current plan remains appropriate    Co-evaluation              AM-PAC PT "6 Clicks" Mobility   Outcome Measure  Help needed turning from your back to your side while in a flat bed without using bedrails?: Total Help needed moving from lying on your back to sitting on the side of a flat bed without using bedrails?: Total Help needed moving to and from a bed to a chair (including a wheelchair)?: Total Help needed standing up from a chair using your arms (e.g., wheelchair or bedside chair)?: Total Help needed to walk in hospital room?: Total Help needed climbing 3-5 steps with a railing? : Total 6 Click Score: 6    End of Session   Activity Tolerance: Patient tolerated treatment well;Patient limited by pain Patient left: in bed;with call bell/phone within reach Nurse Communication: Mobility status PT Visit Diagnosis: Difficulty in walking, not elsewhere classified (R26.2);Other abnormalities of gait and mobility (R26.89);Muscle weakness (generalized) (M62.81)     Time: 2336-1224 PT Time Calculation (min) (ACUTE ONLY): 32 min  Charges:  $Therapeutic Exercise: 23-37 mins                 4:32 PM, 08/07/21 Etta Grandchild, PT, DPT Physical Therapist - [facility]  [phone] ([address])    Klani Caridi C 08/07/2021, 4:27 PM

## 2021-08-07 NOTE — Care Management Important Message (Signed)
Important Message  Patient Details  Name: Diane Patton MRN: [id number] Date of Birth: 07-Jun-1945   Medicare Important Message Given:  N/A - LOS <3 / Initial given by admissions     [name] 08/07/2021, 12:36 PM

## 2021-08-08 ENCOUNTER — Inpatient Hospital Stay: Payer: Medicare Other

## 2021-08-08 DIAGNOSIS — R7881 Bacteremia: Secondary | ICD-10-CM | POA: Diagnosis not present

## 2021-08-08 DIAGNOSIS — G009 Bacterial meningitis, unspecified: Secondary | ICD-10-CM | POA: Diagnosis not present

## 2021-08-08 DIAGNOSIS — R7401 Elevation of levels of liver transaminase levels: Secondary | ICD-10-CM | POA: Diagnosis not present

## 2021-08-08 LAB — CULTURE, BLOOD (ROUTINE X 2)
Culture: NO GROWTH
Culture: NO GROWTH
Special Requests: ADEQUATE
Special Requests: ADEQUATE

## 2021-08-08 LAB — GLUCOSE, CAPILLARY
Glucose-Capillary: 141 mg/dL — ABNORMAL HIGH (ref 70–99)
Glucose-Capillary: 154 mg/dL — ABNORMAL HIGH (ref 70–99)
Glucose-Capillary: 161 mg/dL — ABNORMAL HIGH (ref 70–99)
Glucose-Capillary: 145 mg/dL — ABNORMAL HIGH (ref 70–99)

## 2021-08-08 LAB — COMPREHENSIVE METABOLIC PANEL
ALT: 60 U/L — ABNORMAL HIGH (ref 0–44)
AST: 24 U/L (ref 15–41)
Albumin: 2.6 g/dL — ABNORMAL LOW (ref 3.5–5.0)
Alkaline Phosphatase: 61 U/L (ref 38–126)
Anion gap: 7 (ref 5–15)
BUN: 12 mg/dL (ref 8–23)
CO2: 28 mmol/L (ref 22–32)
Calcium: 9.6 mg/dL (ref 8.9–10.3)
Chloride: 104 mmol/L (ref 98–111)
Creatinine, Ser: 1.07 mg/dL — ABNORMAL HIGH (ref 0.44–1.00)
GFR, Estimated: 53 mL/min — ABNORMAL LOW (ref >60)
Glucose, Bld: 93 mg/dL (ref 70–99)
Potassium: 3.9 mmol/L (ref 3.5–5.1)
Sodium: 139 mmol/L (ref 135–145)
Total Bilirubin: 0.7 mg/dL (ref 0.3–1.2)
Total Protein: 6.5 g/dL (ref 6.5–8.1)

## 2021-08-08 LAB — SYNOVIAL CELL COUNT + DIFF, W/ CRYSTALS
Eosinophils-Synovial: 0 %
Eosinophils-Synovial: 0 %
Lymphocytes-Synovial Fld: 1 %
Lymphocytes-Synovial Fld: 8 %
Monocyte-Macrophage-Synovial Fluid: 27 %
Monocyte-Macrophage-Synovial Fluid: 22 %
Neutrophil, Synovial: 72 %
Neutrophil, Synovial: 70 %
WBC, Synovial: 11626 /mm3 — ABNORMAL HIGH (ref 0–200)
WBC, Synovial: 15021 /mm3 — ABNORMAL HIGH (ref 0–200)

## 2021-08-08 LAB — CBC
HCT: 25.3 % — ABNORMAL LOW (ref 36.0–46.0)
Hemoglobin: 7.9 g/dL — ABNORMAL LOW (ref 12.0–15.0)
MCH: 30.2 pg (ref 26.0–34.0)
MCHC: 31.2 g/dL (ref 30.0–36.0)
MCV: 96.6 fL (ref 80.0–100.0)
Platelets: 298 10*3/uL (ref 150–400)
RBC: 2.62 MIL/uL — ABNORMAL LOW (ref 3.87–5.11)
RDW: 13.1 % (ref 11.5–15.5)
WBC: 11 10*3/uL — ABNORMAL HIGH (ref 4.0–10.5)
nRBC: 0 % (ref 0.0–0.2)

## 2021-08-08 LAB — MAGNESIUM: Magnesium: 1.8 mg/dL (ref 1.7–2.4)

## 2021-08-08 LAB — PHOSPHORUS: Phosphorus: 4.1 mg/dL (ref 2.5–4.6)

## 2021-08-08 LAB — HIV ANTIBODY (ROUTINE TESTING W REFLEX): HIV Screen 4th Generation wRfx: NONREACTIVE

## 2021-08-08 LAB — PROCALCITONIN: Procalcitonin: 0.52 ng/mL

## 2021-08-08 MED ORDER — ENSURE ENLIVE PO LIQD
237.0000 mL | Freq: Three times a day (TID) | ORAL | Status: DC
  Administered 2021-08-08 – 2021-08-11 (×9): 237 mL via ORAL

## 2021-08-08 MED ORDER — OXYCODONE-ACETAMINOPHEN 5-325 MG PO TABS
1.0000 | ORAL_TABLET | Freq: Four times a day (QID) | ORAL | Status: DC | PRN
  Administered 2021-08-08 – 2021-08-09 (×3): 2 via ORAL
  Administered 2021-08-10 (×2): 1 via ORAL
  Filled 2021-08-08 (×2): qty 1
  Filled 2021-08-08 (×3): qty 2

## 2021-08-08 MED ORDER — MORPHINE SULFATE (PF) 2 MG/ML IV SOLN: 1.0000 mg | INTRAVENOUS | Status: DC | PRN

## 2021-08-08 NOTE — Progress Notes (Signed)
Nutrition Follow-up  DOCUMENTATION CODES:   Not applicable  INTERVENTION:   -Continue Magic cup TID with meals, each supplement provides 290 kcal and 9 grams of protein  -Continue MVI with minerals daily -Increase Ensure Enlive po to TID, each supplement provides 350 kcal and 20 grams of protein.   NUTRITION DIAGNOSIS:   Inadequate oral intake related to inability to eat (pt sedated and ventilated) as evidenced by NPO status.  Progressing; advanced to PO diet on 08/03/21  GOAL:   Patient will meet greater than or equal to 90% of their needs  Progressing   MONITOR:   PO intake, Supplement acceptance, Labs, Weight trends, Skin, I & O's  REASON FOR ASSESSMENT:   Ventilator    ASSESSMENT:   76 y/o female with h/o CHF, COPD, DM, GERD, HTN, HLD, CKD, OSA and gastric ulcer who is admitted with AMS, sepsis, bactermia and suspected meningitis.  6/14- extubated 6/15- s/p BSE- advanced to dysphasia 1 diet with thin liquids 6/17- s/p BSE- advanced to dysphagia 2 diet with thin liquids  Reviewed I/O's: -863 ml x 24 hours and -4.4 L since admission  UOP: 1.1 L x 24 hours   Pt lying in bed at time of visit. Pt is HOH and RD had to raise voice to communicate with pt. Pt reports she has a poor appetite. Noted meal completions 30%. Pt reports she really enjoys Ensure supplements and has been drinking them. Discussed importance of good meal and supplement intake to promote healing.   Per chart review, plan to d./c to SNF once medically stable.   Labs reviewed: CBGS: 112-165 (inpatient orders for glycemic control are 0-5 units insulin aspart daily at bedtime, 0-9 units insulin aspart TID before meals, snd 8 units insulin detemir BID).    Diet Order:   Diet Order             DIET DYS 2 Room service appropriate? Yes with Assist; Fluid consistency: Thin  Diet effective now                   EDUCATION NEEDS:   No education needs have been identified at this time  Skin:   Skin Assessment: Reviewed RN Assessment  Last BM:  08/07/21  Height:   Ht Readings from Last 1 Encounters:  08/02/21 5' 5.98" (1.676 m)    Weight:   Wt Readings from Last 1 Encounters:  08/07/21 80.1 kg    Ideal Body Weight:  59 kg  BMI:  Body mass index is 28.52 kg/m.  Estimated Nutritional Needs:   Kcal:  1700-1900kcal/day  Protein:  85-95g/day  Fluid:  1.5-1.7L/day    Loistine Chance, RD, LDN, [place] Registered Dietitian II Certified Diabetes Care and Education Specialist Please refer to AMION for RD and/or RD on-call/weekend/after hours pager

## 2021-08-08 NOTE — Progress Notes (Signed)
Date of Admission:  07/30/2021    ID: Diane Patton is a 76 y.o. female  Principal Problem:   Sepsis ([place]) Active Problems:   Essential hypertension   Chronic systolic heart failure (HCC)   Diabetes mellitus, type 2 (HCC)   Chronic obstructive pulmonary disease (H[facility])   Cardiomyopathy, nonischemic (H[facility])   Anemia in chronic kidney disease   Stage 3b chronic kidney disease ([place])   Acute metabolic encephalopathy   Acute respiratory failure with hypoxia ([facility])    Subjective: Says she is feeling better She does not remember cleaning her ear with hairpin and making it bleed Underwent aspiration of the knee joints today Medications:   Chlorhexidine Gluconate Cloth  6 each Topical Daily   enoxaparin (LOVENOX) injection  40 mg Subcutaneous Q24H   feeding supplement  237 mL Oral TID BM   insulin aspart  0-5 Units Subcutaneous QHS   insulin aspart  0-9 Units Subcutaneous TID AC   insulin detemir  8 Units Subcutaneous BID   multivitamin with minerals  1 tablet Oral Daily   pantoprazole  40 mg Oral Daily    Objective: Vital signs in last 24 hours: Temp:  [99.1 F (37.3 C)-102.3 F (39.1 C)] 99.2 F (37.3 C) (06/20 1156) Pulse Rate:  [79-102] 102 (06/20 1156) Resp:  [17-21] 20 (06/20 1156) BP: (129-166)/(51-77) 151/71 (06/20 1156) SpO2:  [98 %-100 %] 99 % (06/20 1156)    PHYSICAL EXAM:  General: Alert, cooperative, no distress, appears stated age. Hard of hearing left ear Head: Normocephalic, without obvious abnormality, atraumatic. Eyes: Conjunctivae clear, anicteric sclerae. Pupils are equal ENT Nares normal. No drainage or sinus tenderness. Lips, mucosa, and tongue normal. No Thrush Neck: Supple, symmetrical, no adenopathy, thyroid: non tender no carotid bruit and no JVD. Back: No CVA tenderness. Lungs: Clear to auscultation bilaterally. No Wheezing or Rhonchi. No rales. Heart: Regular rate and rhythm, no murmur, rub or gallop. Abdomen: Soft, non-tender,not distended.  Bowel sounds normal. No masses Extremities: atraumatic, no cyanosis. No edema. No clubbing Skin: No rashes or lesions. Or bruising Lymph: Cervical, supraclavicular normal. Neurologic: Grossly non-focal  Lab Results Recent Labs    08/07/21 0644 08/08/21 0628  WBC 12.1* 11.0*  HGB 8.3* 7.9*  HCT 25.9* 25.3*  NA 137 139  K 4.0 3.9  CL 103 104  CO2 27 28  BUN 13 12  CREATININE 0.97 1.07*   Liver Panel Recent Labs    08/07/21 0644 08/08/21 0628  PROT 6.4* 6.5  ALBUMIN 2.9* 2.6*  AST 23 24  ALT 79* 60*  ALKPHOS 60 61  BILITOT 0.6 0.7   Sedimentation Rate No results for input(s): "ESRSEDRATE" in the last 72 hours. C-Reactive Protein No results for input(s): "CRP" in the last 72 hours.  Microbiology:  Studies/Results: DG Knee 1-2 Views Left  Result Date: 08/07/2021 CLINICAL DATA:  Bilateral knee pain. EXAM: LEFT KNEE - 1-2 VIEW; RIGHT KNEE - 1-2 VIEW COMPARISON:  CT of the left knee 07/30/2013. FINDINGS: Right knee: The bones are demineralized. There is no evidence of acute fracture or dislocation. There are tricompartmental degenerative changes which are most advanced in the lateral compartment. Mild resulting genu valgus deformity. There is a large knee joint effusion without definite layering intra-articular fat. Meniscal chondrocalcinosis and vascular calcifications are noted. Left knee: The bones are demineralized. No evidence of acute fracture or dislocation. There are tricompartmental degenerative changes which are most advanced in the patellofemoral compartment. Small knee joint effusion, meniscal chondrocalcinosis and vascular calcifications are noted.  IMPRESSION: 1. No definite acute osseous findings within either knee. 2. Nonspecific large right and small left knee joint effusions. 3. 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 Electronically Signed   By: Richardean Sale M.D.   On: 08/07/2021 11:12   DG Knee 1-2 Views Right  Result Date:  08/07/2021 CLINICAL DATA:  Bilateral knee pain. EXAM: LEFT KNEE - 1-2 VIEW; RIGHT KNEE - 1-2 VIEW COMPARISON:  CT of the left knee 07/30/2013. FINDINGS: Right knee: The bones are demineralized. There is no evidence of acute fracture or dislocation. There are tricompartmental degenerative changes which are most advanced in the lateral compartment. Mild resulting genu valgus deformity. There is a large knee joint effusion without definite layering intra-articular fat. Meniscal chondrocalcinosis and vascular calcifications are noted. Left knee: The bones are demineralized. No evidence of acute fracture or dislocation. There are tricompartmental degenerative changes which are most advanced in the patellofemoral compartment. Small knee joint effusion, meniscal chondrocalcinosis and vascular calcifications are noted. IMPRESSION: 1. No definite acute osseous findings within either knee. 2. Nonspecific large right and small left knee joint effusions. 3. 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 Electronically Signed   By: Richardean Sale M.D.   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  B/l knee swelling and pain- aspirated --has CPPD b/l  Discussed the management with patient and family and care team

## 2021-08-08 NOTE — Progress Notes (Signed)
PROGRESS NOTE   HPI was taken from Dr. Damita Dunnings: Diane Patton is a 76 y.o. 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  EMS was called to the home where they found her temperature to be 101.3.  She was very confused and repeating words then started vomiting.  Due to agitation in route she was administered Versed and Haldol.  By arrival she was mostly somnolent, due to medications administered. ED course and data review: Temp 99.2, pulse 109, respirations 30-40 with O2 sat as low as 84% requiring 2 L O2 to maintain sats in the high 90s.  BP 154/66.  Blood work with WBC 11,300 and lactic acid 2.2.  Hemoglobin 10 which is her baseline.  Creatinine 1.38 at baseline.  Urinalysis unremarkable and COVID and flu negative.  EKG, personally viewed and interpreted with sinus tachycardia at 104 and left bundle branch block.  Chest x-ray with no acute cardiopulmonary disease. CT chest abdomen pelvis showed no acute findings as follows: IMPRESSION: 1. No acute intrathoracic, abdominal, or pelvic pathology. 2. Colonic diverticulosis. No bowel obstruction. 3. Aortic Atherosclerosis (ICD10-I70.0).   Patient started on broad-spectrum antibiotics for sepsis of unknown source and hospitalist consulted for admission.     As per Dr. Mortimer Fries: 07/30/21: Admit to SDU with Sepsis d/t unknown source (suspicious for meningitis) and AMS with PCCM consult due to [place] 6/11 patient emergently intubated, RT CVL placed  6/12 s/p LP via FLOURO CSF c/w MENINGITIS 6/13 failure to wean from vent, severe meningitis 6/14 extubated to [facility]   As per Dr. Jimmye Norman 6/16-6/20/23: Pt continue on IV rocephin as per ID.  Pt continued to have intermittent fevers. 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 Ortho surg was consulted for pain and swelling of both knees. XRs of b/l knee shows large right & small left knee joint effusions. ID was concerned about a possibility of septic arthritis. Of note, PT/OT evaluated the pt and recs SNF. Pt and pt's husband are now agreeable to SNF. Pt is spiking fevers again so pt must be fever free x 24 hrs prior to d/c.     Diane Patton  PJA:[id number] DOB: 05/13/1945 DOA: 07/30/2021 PCP: Tracie Harrier, MD   Assessment & Plan:   Principal Problem:   Sepsis [facility]) Active Problems:   Acute metabolic encephalopathy   Acute respiratory failure with hypoxia (HCC)   Chronic systolic heart failure ([place])   Essential hypertension   Diabetes mellitus, type 2 (HCC)   Chronic obstructive pulmonary disease (HCC)   Cardiomyopathy, nonischemic (HCC)   Anemia in chronic kidney disease   Stage 3b chronic kidney disease ([place])  Assessment and Plan: Severe acute hypoxic & hypercapnic respiratory: resolved. Currently on RA    Septic shock: see Dr. Zoila Shutter note on how pt meet septic shock criteria. Secondary to bacterial meningitis, strep pneumonia bacteremia. Off of pressors. 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 Hard of hearing is not new for the pt and was present prior to admission to the hospital as per pt's son. MRI brain showed persistent findings consistent w/ hx of meningitis, similar associated hemorrhage at the right temporal region, persistent large b/l mastoid effusions.  Spike a fever on 08/07/21. Possible concern for dehiscence of tegman tympani as per ID. ENT was consulted (Dr. Tami Ribas)   B/l knee effusions: large right & small left effusions as per XRs. Concern for septic arthritis as per ID. Ortho surg consulted (Dr. Mack Guise).   Bacteremia: blood cxs growing strep pneumonia. Repeat blood cxs NGTD. Continue on IV  ceftriaxone as per ID   Right temporal hemorrhage: EEG was WNL, no seizures or epileptiform dischages were seen. Neuro recs apprec & signed off (see notes)   Acute toxic encephalopathy: secondary to bacterial meningitis. Mental status is close to baseline   AKI: Cr is labile. Avoid nephrotoxic meds    Transaminitis: etiology unclear. AST is WNL. ALT is elevated but continues to trend down   Normocytic anemia:H&H are labile. Will transfuse if Hb < 7.0        DVT prophylaxis: lovenox  Code Status: full  Family Communication:  called pt's husband, Diane Patton, no answer & unable to leave a voicemail  Disposition Plan: likely d/c to SNF  Level of care: Progressive  Status is: Inpatient Remains inpatient appropriate because: severity of illness, spiked fever on 08/07/21. ENT & ortho surg consulted today     Consultants:  ICU ID Neuro  ENT Ortho surg   Procedures:   Antimicrobials: rocephin   Subjective: Pt c/o b/l knee pain   Objective: Vitals:   08/07/21 1949 08/07/21 2148 08/07/21 2321 08/08/21 0335  BP: (!) 146/62 (!) 148/62 (!) 155/60 129/70  Pulse: 97 83 98 96  Resp: '18  18 18  '$ Temp: 99.4 F (37.4 C) 99.1 F (37.3 C) 99.3 F (37.4 C) 99.4 F (37.4 C)  TempSrc:  Oral  Oral  SpO2: 100% 100% 98% 98%  Weight:      Height:        Intake/Output Summary (Last 24 hours) at 08/08/2021 0750 Last data filed at 08/08/2021 5053 Gross per 24 hour  Intake 237 ml  Output 1100 ml  Net -863 ml   Filed Weights   08/05/21 0500 08/06/21 0300 08/07/21 0500  Weight: 82.4 kg 80.5 kg 80.1 kg    Examination:  General exam: Appears calm & comfortable. Hard of hearing  Respiratory system: clear breath sounds b/l   Cardiovascular system: S1 & S2+. No rubs or clicks  Gastrointestinal system: Abd is soft, NT, ND & hypoactive bowel sounds  Central nervous system: Alert and oriented. Moves all extremities  Psychiatry: judgement and insight appears at baseline. Flat mood and  affect     Data Reviewed: I have personally reviewed following labs and imaging studies  CBC: Recent Labs  Lab 08/04/21 1222 08/05/21 0540 08/06/21 0504 08/07/21 0644 08/08/21 0628  WBC 8.7 8.7 9.4 12.1* 11.0*  HGB 7.9* 8.0* 8.1* 8.3* 7.9*  HCT 24.9* 25.1* 25.7* 25.9* 25.3*  MCV 96.9 95.4 96.3 95.2 96.6  PLT 171 198 218 278 976   Basic Metabolic Panel: Recent Labs  Lab 08/04/21 0552 08/04/21 1222 08/05/21 0540 08/06/21 0504 08/07/21 0644 08/08/21 0628  NA  --  141 140 140 137 139  K  --  3.6 3.7 3.8 4.0 3.9  CL  --  108 106 105 103 104  CO2  --  '27 28 28 27 28  '$ GLUCOSE  --  237* 124* 102* 131* 93  BUN  --  25* '22 18 13 12  '$ CREATININE  --  1.21* 0.91 1.02* 0.97 1.07*  CALCIUM  --  9.5 9.6 9.6 9.7 9.6  MG 1.8  --  1.7 1.7 1.7 1.8  PHOS 2.8  --  3.6 4.1 4.0 4.1   GFR: Estimated Creatinine Clearance: 47 mL/min (A) (by C-G formula based on SCr of 1.07 mg/dL (H)). Liver Function Tests: Recent Labs  Lab 08/04/21 1222 08/05/21 0540 08/06/21 0504 08/07/21 0644 08/08/21 0628  AST 176* 90* 43* 23 24  ALT 154* 154* 114* 79* 60*  ALKPHOS 69 64 61 60 61  BILITOT 0.8 0.7 0.6 0.6 0.7  PROT 5.8* 5.7* 6.2* 6.4* 6.5  ALBUMIN 2.8* 2.6* 2.7* 2.9* 2.6*   No results for input(s): "LIPASE", "AMYLASE" in the last 168 hours. No results for input(s): "AMMONIA" in the last 168 hours. Coagulation Profile: No results for input(s): "INR", "PROTIME" in the last 168 hours.  Cardiac Enzymes: No results for input(s): "CKTOTAL", "CKMB", "CKMBINDEX", "TROPONINI" in the last 168 hours. BNP (last 3 results) No results for input(s): "PROBNP" in the last 8760 hours. HbA1C: No results for input(s): "HGBA1C" in the last 72 hours. CBG: Recent Labs  Lab 08/07/21 0413 08/07/21 0801 08/07/21 1213 08/07/21 1709 08/07/21 2032  GLUCAP 100* 120* 179* 112* 165*   Lipid Profile: No results for input(s): "CHOL", "HDL", "LDLCALC", "TRIG", "CHOLHDL", "LDLDIRECT" in the last 72  hours.  Thyroid Function Tests: No results for input(s): "TSH", "T4TOTAL", "FREET4", "T3FREE", "THYROIDAB" in the last 72 hours. Anemia Panel: No results for input(s): "VITAMINB12", "FOLATE", "FERRITIN", "TIBC", "IRON", "RETICCTPCT" in the last 72 hours. Sepsis Labs: Recent Labs  Lab 08/02/21 0352  PROCALCITON 27.18    Recent Results (from the past 240 hour(s))  Resp Panel by RT-PCR (Flu A&B, Covid) Anterior Nasal Swab     Status: None   Collection Time: 07/30/21  6:20 PM   Specimen: Anterior Nasal Swab  Result Value Ref Range Status   SARS Coronavirus 2 by RT PCR NEGATIVE NEGATIVE Final    Comment: (NOTE) SARS-CoV-2 target nucleic acids are NOT DETECTED.  The SARS-CoV-2 RNA is generally detectable in upper respiratory specimens during the acute phase of infection. The lowest concentration of SARS-CoV-2 viral copies this assay can detect is 138 copies/mL. A negative result does not preclude SARS-Cov-2 infection and should not be used as the sole basis for treatment or other patient management decisions. A negative result may occur with  improper specimen collection/handling, submission of specimen other than nasopharyngeal swab, presence of viral mutation(s) within the areas targeted by this assay, and inadequate number of viral copies(<138 copies/mL). A negative result must be combined with clinical observations, patient history, and epidemiological information. The expected result is Negative.  Fact Sheet for Patients:  EntrepreneurPulse.com.au  Fact Sheet for Healthcare Providers:  IncredibleEmployment.be  This test is no t yet approved or cleared by the [place] FDA and  has been authorized for detection and/or diagnosis of SARS-CoV-2 by FDA under an Emergency Use Authorization (EUA). This EUA will remain  in effect (meaning this test can be used) for the duration of the COVID-19 declaration under Section 564(b)(1) of the Act,  21 U.S.C.section 360bbb-3(b)(1), unless the authorization is terminated  or revoked sooner.       Influenza A by PCR NEGATIVE NEGATIVE Final   Influenza B by PCR NEGATIVE NEGATIVE Final    Comment: (NOTE) The Xpert Xpress SARS-CoV-2/FLU/RSV plus assay is intended as an aid in the diagnosis of influenza from Nasopharyngeal swab specimens and should not be used as a sole basis for treatment. Nasal washings and aspirates are unacceptable for Xpert Xpress SARS-CoV-2/FLU/RSV testing.  Fact Sheet for Patients: EntrepreneurPulse.com.au  Fact Sheet for Healthcare Providers: IncredibleEmployment.be  This test is not yet approved or cleared by the [place] FDA and has been authorized for detection and/or diagnosis of SARS-CoV-2 by FDA under an Emergency Use Authorization (EUA). This EUA will remain in effect (meaning this test can be used) for the duration of the COVID-19 declaration under Section 564(b)(1) of the Act, 21 U.S.C. section 360bbb-3(b)(1), unless the authorization is terminated or revoked.  Performed at [facility], [address], [place], [place] [address]   Blood Culture (routine x 2)     Status: Abnormal   Collection Time: 07/30/21  6:20 PM   Specimen: BLOOD  Result Value Ref Range Status   Specimen Description   Final    BLOOD RAC Performed at [facility], [address], [place], [place] [address]    Special Requests   Final    BOTTLES DRAWN AEROBIC AND ANAEROBIC BCLV Performed at [facility], [address], [place], [place] [address]    Culture  Setup Time   Final    GRAM POSITIVE COCCI IN BOTH AEROBIC AND ANAEROBIC BOTTLES Organism ID to follow CRITICAL RESULT CALLED TO, READ BACK BY AND VERIFIED WITH: Violeta Gelinas 07/31/21 0515 MW Performed at [facility], [address] [place], [place] [address]    Culture STREPTOCOCCUS PNEUMONIAE (A)  Final   Report Status  08/02/2021 FINAL  Final   Organism ID, Bacteria STREPTOCOCCUS PNEUMONIAE  Final      Susceptibility   Streptococcus pneumoniae - MIC*    ERYTHROMYCIN >=8 RESISTANT Resistant     LEVOFLOXACIN 0.5 SENSITIVE Sensitive     VANCOMYCIN 0.25 SENSITIVE Sensitive     PENICILLIN (meningitis) 0.25 RESISTANT Resistant     PENO - penicillin 0.25      PENICILLIN (non-meningitis) 0.25 SENSITIVE Sensitive     PENICILLIN (oral) 0.25 INTERMEDIATE Intermediate     CEFTRIAXONE (non-meningitis) <=0.12 SENSITIVE Sensitive     CEFTRIAXONE (meningitis) <=0.12 SENSITIVE Sensitive     * STREPTOCOCCUS PNEUMONIAE  Urine Culture     Status: None   Collection Time: 07/30/21  6:20 PM   Specimen: Urine, Random  Result Value Ref Range Status   Specimen Description   Final    URINE, RANDOM Performed at [facility], [address], [place], [place] [address]    Special Requests   Final    NONE Performed at [facility], [address], [place], [place] [address]    Culture   Final    NO GROWTH Performed at [facility], [place] [address] [place], [place] [address]    Report Status 08/01/2021 FINAL  Final  Blood Culture ID Panel (Reflexed)     Status: Abnormal   Collection Time: 07/30/21  6:20 PM  Result Value Ref Range Status   Enterococcus faecalis NOT DETECTED NOT DETECTED Final   Enterococcus Faecium NOT DETECTED NOT DETECTED Final   Listeria monocytogenes NOT DETECTED NOT DETECTED Final   Staphylococcus species NOT DETECTED NOT DETECTED Final   Staphylococcus aureus (BCID) NOT DETECTED NOT DETECTED Final   Staphylococcus epidermidis NOT DETECTED NOT DETECTED Final   Staphylococcus lugdunensis NOT DETECTED NOT DETECTED Final   Streptococcus species DETECTED (A) NOT DETECTED Final    Comment: CRITICAL RESULT CALLED TO, READ BACK BY AND VERIFIED WITH: JASON ROBBINS 07/31/21 0515 MW    Streptococcus agalactiae NOT DETECTED NOT DETECTED Final   Streptococcus pneumoniae DETECTED (A)  NOT DETECTED Final    Comment: CRITICAL  RESULT CALLED TO, READ BACK BY AND VERIFIED WITH: JASON ROBBINS 07/31/21 0515 MW    Streptococcus pyogenes NOT DETECTED NOT DETECTED Final   A.calcoaceticus-baumannii NOT DETECTED NOT DETECTED Final   Bacteroides fragilis NOT DETECTED NOT DETECTED Final   Enterobacterales NOT DETECTED NOT DETECTED Final   Enterobacter cloacae complex NOT DETECTED NOT DETECTED Final   Escherichia coli NOT DETECTED NOT DETECTED Final   Klebsiella aerogenes NOT DETECTED NOT DETECTED Final   Klebsiella oxytoca NOT DETECTED NOT DETECTED Final   Klebsiella pneumoniae NOT DETECTED NOT DETECTED Final   Proteus species NOT DETECTED NOT DETECTED Final   Salmonella species NOT DETECTED NOT DETECTED Final   Serratia marcescens NOT DETECTED NOT DETECTED Final   Haemophilus influenzae NOT DETECTED NOT DETECTED Final   Neisseria meningitidis NOT DETECTED NOT DETECTED Final   Pseudomonas aeruginosa NOT DETECTED NOT DETECTED Final   Stenotrophomonas maltophilia NOT DETECTED NOT DETECTED Final   Candida albicans NOT DETECTED NOT DETECTED Final   Candida auris NOT DETECTED NOT DETECTED Final   Candida glabrata NOT DETECTED NOT DETECTED Final   Candida krusei NOT DETECTED NOT DETECTED Final   Candida parapsilosis NOT DETECTED NOT DETECTED Final   Candida tropicalis NOT DETECTED NOT DETECTED Final   Cryptococcus neoformans/gattii NOT DETECTED NOT DETECTED Final    Comment: Performed at [facility], [address] [place], [place] [address]  Culture, blood (Routine X 2) w Reflex to ID Panel     Status: None   Collection Time: 07/30/21 11:36 PM   Specimen: BLOOD  Result Value Ref Range Status   Specimen Description BLOOD RIGHT WRIST  Final   Special Requests   Final    BOTTLES DRAWN AEROBIC ONLY Blood Culture results may not be optimal due to an inadequate volume of blood received in culture bottles   Culture   Final    NO GROWTH 5 DAYS Performed at [facility], [address] [place], [place] [address]    Report Status 08/04/2021 FINAL  Final  Culture, Respiratory w Gram Stain     Status: None   Collection Time: 07/31/21  1:34 AM   Specimen: Tracheal Aspirate; Respiratory  Result Value Ref Range Status   Specimen Description   Final    TRACHEAL ASPIRATE Performed at [facility], [address] [place], [place] [address]    Special Requests   Final    NONE Performed at [facility], [place], [place] [address]    Gram Stain NO WBC SEEN NO ORGANISMS SEEN   Final   Culture   Final    Normal respiratory flora-no Staph aureus or Pseudomonas seen Performed at [place] [address] [place], [place] [address]    Report Status 08/02/2021 FINAL  Final  CSF culture w Gram Stain     Status: None   Collection Time: 07/31/21  2:42 PM   Specimen: PATH Cytology CSF; Cerebrospinal Fluid  Result Value Ref Range Status   Specimen Description   Final    CSF Performed at [facility], [address], [place], [place] [address]    Special Requests   Final    NONE Performed at [facility], [place], [place] [address]    Gram Stain   Final    NO ORGANISMS SEEN WBC SEEN RED BLOOD CELLS RESULT CALLED TO, READ BACK BY AND VERIFIED WITH: EVON CONNALLY AT 2505 ON 07/31/21 BY SS Performed at [facility], [address], [place], [place] [address]    Culture   Final    NO GROWTH 3 DAYS Performed at [place] [address], [place], [place] [address]    Report Status 08/03/2021 FINAL  Final  Culture, fungus without smear     Status: None (Preliminary result)   Collection Time: 07/31/21  2:42 PM   Specimen: PATH Cytology CSF; Cerebrospinal Fluid  Result Value Ref Range Status   Specimen Description   Final    CSF Performed at [facility], [address] [place], [place] [address]    Special Requests   Final     NONE Performed at [facility], [address] [place], [place] [address]    Culture   Final    NO FUNGUS ISOLATED AFTER 7 DAYS Performed at [facility], [place] [address], [place], [place] [address]    Report Status PENDING  Incomplete  Culture, blood (Routine X 2) w Reflex to ID Panel     Status: None (Preliminary result)   Collection Time: 08/02/21  3:50 AM   Specimen: BLOOD  Result Value Ref Range Status   Specimen Description BLOOD RIGHT HAND  Final   Special Requests   Final    BOTTLES DRAWN AEROBIC ONLY Blood Culture adequate volume   Culture   Final    NO GROWTH 4 DAYS Performed at [facility], [address], [place], [place] [address]    Report Status PENDING  Incomplete  Culture, blood (Routine X 2) w Reflex to ID Panel     Status: None (Preliminary result)   Collection Time: 08/02/21  3:50 AM   Specimen: BLOOD  Result Value Ref Range Status   Specimen Description BLOOD RIGHT INDEX FINGER  Final   Special Requests IN PEDIATRIC BOTTLE Blood Culture adequate volume  Final   Culture   Final    NO GROWTH 4 DAYS Performed at [facility], [address], [place], [place] [address]    Report Status PENDING  Incomplete         Radiology Studies: DG Knee 1-2 Views Left  Result Date: 08/07/2021 CLINICAL DATA:  Bilateral knee pain. EXAM: LEFT KNEE - 1-2 VIEW; RIGHT KNEE - 1-2 VIEW COMPARISON:  CT of the left knee 07/30/2013. FINDINGS: Right knee: The bones are demineralized. There is no evidence of acute fracture or dislocation. There are tricompartmental degenerative changes which are most advanced in the lateral compartment. Mild resulting genu valgus deformity. There is a large knee joint effusion without definite layering intra-articular fat. Meniscal chondrocalcinosis and vascular calcifications are noted. Left knee: The bones are demineralized. No evidence of acute fracture or dislocation. There are tricompartmental degenerative  changes which are most advanced in the patellofemoral compartment. Small knee joint effusion, meniscal chondrocalcinosis and vascular calcifications are noted. IMPRESSION: 1. No definite acute osseous findings within either knee. 2. Nonspecific large right and small left knee joint effusions. 3. 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 Electronically Signed   By: Richardean Sale M.D.   On: 08/07/2021 11:12   DG Knee 1-2 Views Right  Result Date: 08/07/2021 CLINICAL DATA:  Bilateral knee pain. EXAM: LEFT KNEE - 1-2 VIEW; RIGHT KNEE - 1-2 VIEW COMPARISON:  CT of the left knee 07/30/2013. FINDINGS: Right knee: The bones are demineralized. There is no evidence of acute fracture or dislocation. There are tricompartmental degenerative changes which are most advanced in the lateral compartment. Mild resulting  genu valgus deformity. There is a large knee joint effusion without definite layering intra-articular fat. Meniscal chondrocalcinosis and vascular calcifications are noted. Left knee: The bones are demineralized. No evidence of acute fracture or dislocation. There are tricompartmental degenerative changes which are most advanced in the patellofemoral compartment. Small knee joint effusion, meniscal chondrocalcinosis and vascular calcifications are noted. IMPRESSION: 1. No definite acute osseous findings within either knee. 2. Nonspecific large right and small left knee joint effusions. 3. 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 Electronically Signed   By: Richardean Sale M.D.   On: 08/07/2021 11:12        Scheduled Meds:  Chlorhexidine Gluconate Cloth  6 each Topical Daily   enoxaparin (LOVENOX) injection  40 mg Subcutaneous Q24H   feeding supplement  237 mL Oral BID BM   insulin aspart  0-5 Units Subcutaneous QHS   insulin aspart  0-9 Units Subcutaneous TID AC   insulin detemir  8 Units Subcutaneous BID   multivitamin with minerals  1 tablet Oral  Daily   pantoprazole  40 mg Oral Daily   Continuous Infusions:  sodium chloride Stopped (08/06/21 1546)   cefTRIAXone (ROCEPHIN)  IV 2 g (08/08/21 3953)     LOS: 9 days    Time spent: 30 mins     Wyvonnia Dusky, MD [facility] Pager [phone]  If 7PM-7AM, please contact night-coverage  08/08/2021, 7:50 AM

## 2021-08-08 NOTE — TOC Progression Note (Addendum)
Transition of Care [facility]) - Progression Note    Patient Details  Name: Diane Patton MRN: [id number] Date of Birth: [birth year]  Transition of [facility]) CM/SW [place], [place] Phone Number: 08/08/2021, 2:56 PM  Clinical Narrative:      Update: Patient requested CSW also call her granddaughter Diane Patton to update on dc plan to go to [facility], CSW has done so. Diane Patton requested calls from MD and RN as well, this requested has been relayed to treatment team.     Bed offers provided patient's spouse chooses [facility] due to location in [place] close to where they live. Patient's spouse aware to bring patient's home Ozempic at time of discharge per request from Admissions Diane Patton with [facility].   Insurance authorization has been started with [facility] pending auth at this time.    Expected Discharge Plan: [place] Barriers to Discharge: Continued Medical Work up  Expected Discharge Plan and Services Expected Discharge Plan: [place]   Discharge Planning Services: CM Consult   Living arrangements for the past 2 months: Single Family Home                                       Social Determinants of Health (SDOH) Interventions    Readmission Risk Interventions     No data to display

## 2021-08-08 NOTE — Consult Note (Signed)
Full consultation to follow.  Bilateral knee aspirations performed and sent to the lab for cell count, Gram stain and culture.

## 2021-08-08 NOTE — Consult Note (Signed)
ORTHOPAEDIC CONSULTATION  REQUESTING PHYSICIAN: Wyvonnia Dusky, MD  Chief Complaint: Bilateral knee pain and swelling  HPI: Diane Patton is a 76 y.o. female who was admitted to [facility] with sepsis of an unknown source.  She was admitted to the stepdown unit and has been on IV Rocephin per infectious disease.  Orthopedics is consulted for bilateral knee swelling and pain.  Infectious disease is concerned about the possibility of septic arthritis given her recent sepsis.  Patient was seen in her hospital room earlier today sitting up in a chair.  Patient was in no acute distress.  Patient explains that she does get knee swelling periodically and has received injections in her knee from her primary care physician in the past.  Patient denies recent injuries.  Past Medical History:  Diagnosis Date   Asthma    Cardiomyopathy, nonischemic (HCC)    CHF (congestive heart failure) (HCC)    COPD (chronic obstructive pulmonary disease) (HCC)    Diabetes mellitus, type 2 (HCC)    Gastric ulcer without hemorrhage or perforation    GERD (gastroesophageal reflux disease)    HTN (hypertension)    Hyperlipidemia    IDA (iron deficiency anemia) 04/05/2020   Obstructive sleep apnea on CPAP    Osteoarthritis    osteo of both knees   Scoliosis    Past Surgical History:  Procedure Laterality Date   ABDOMINAL HYSTERECTOMY     ankle (other)     APPENDECTOMY     BREAST BIOPSY Right 1999   neg   BREAST CYST ASPIRATION     neg   BREAST SURGERY     CARDIAC CATHETERIZATION     CLAVICLE SURGERY Right    Fx   COLONOSCOPY WITH PROPOFOL N/A 07/10/2016   Procedure: COLONOSCOPY WITH PROPOFOL;  Surgeon: Lollie Sails, MD;  Location: [facility] ENDOSCOPY;  Service: Endoscopy;  Laterality: N/A;   ear (otheR)     ESOPHAGOGASTRODUODENOSCOPY N/A 05/11/2019   Procedure: ESOPHAGOGASTRODUODENOSCOPY (EGD);  Surgeon: Toledo, Benay Pike, MD;  Location: [facility] ENDOSCOPY;  Service:  Gastroenterology;  Laterality: N/A;   ESOPHAGOGASTRODUODENOSCOPY (EGD) WITH PROPOFOL N/A 07/10/2016   Procedure: ESOPHAGOGASTRODUODENOSCOPY (EGD) WITH PROPOFOL;  Surgeon: Lollie Sails, MD;  Location: [facility] ENDOSCOPY;  Service: Endoscopy;  Laterality: N/A;   ESOPHAGOGASTRODUODENOSCOPY (EGD) WITH PROPOFOL N/A 09/17/2017   Procedure: ESOPHAGOGASTRODUODENOSCOPY (EGD) WITH PROPOFOL;  Surgeon: Lollie Sails, MD;  Location: [facility] ENDOSCOPY;  Service: Endoscopy;  Laterality: N/A;   feet (other)     HERNIA REPAIR     sinus (other)     stomach     UPPER ESOPHAGEAL ENDOSCOPIC ULTRASOUND (EUS) N/A 11/22/2016   Procedure: UPPER ESOPHAGEAL ENDOSCOPIC ULTRASOUND (EUS);  Surgeon: Reita Cliche, MD;  Location: [facility] ENDOSCOPY;  Service: Gastroenterology;  Laterality: N/A;   Social History   Socioeconomic History   Marital status: Married    Spouse name: Not on file   Number of children: Not on file   Years of education: Not on file   Highest education level: Not on file  Occupational History   Occupation: retired  Tobacco Use   Smoking status: Former    Packs/day: 1.00    Years: 15.00    Total pack years: 15.00    Types: Cigarettes    Quit date: 07/21/1982    Years since quitting: 39.0   Smokeless tobacco: Never  Vaping Use   Vaping Use: Never used  Substance and Sexual Activity   Alcohol use: No    Alcohol/week: 15.0  standard drinks of alcohol    Types: 15 Standard drinks or equivalent per week    Comment: quit drinking 04/1981   Drug use: No   Sexual activity: Not on file  Other Topics Concern   Not on file  Social History Narrative   Disabled. Regularly exercises.    Social Determinants of Health   Financial Resource Strain: Not on file  Food Insecurity: Not on file  Transportation Needs: Not on file  Physical Activity: Not on file  Stress: Not on file  Social Connections: Not on file   Family History  Problem Relation Age of Onset   Alzheimer's disease Other    Heart  attack Mother    Hypertension Mother    Cancer Brother        lung   Cancer Maternal Aunt        mouth and breast   Breast cancer Maternal Aunt    Cancer Daughter        throat   Dementia Father    Allergies  Allergen Reactions   Amoxicillin Other (See Comments)    GI distress   Aspirin Diarrhea and Nausea And Vomiting   Celebrex [Celecoxib] Nausea Only   Latex Itching and Swelling   Penicillins Other (See Comments)    GI distress   Salicylates    Allegra [Fexofenadine] Cough   Prior to Admission medications   Medication Sig Start Date End Date Taking? Authorizing Provider  albuterol (PROVENTIL HFA;VENTOLIN HFA) 108 (90 BASE) MCG/ACT inhaler Inhale 2 puffs into the lungs every 6 (six) hours as needed for wheezing or shortness of breath.   Yes [provider]  aspirin 81 MG tablet Take 81 mg by mouth daily.   Yes [provider]  Biotin 1000 MCG tablet Take 1,000 mcg by mouth daily.    Yes [provider]  busPIRone (BUSPAR) 5 MG tablet Take 5 mg by mouth 3 (three) times daily. 07/18/21  Yes [provider]  calcium carbonate (OS-CAL) 600 MG TABS tablet Take 600 mg by mouth 2 (two) times daily with a meal.    Yes [provider]  cetirizine (ZYRTEC) 10 MG tablet Take 10 mg by mouth daily as needed.   Yes [provider]  cholecalciferol (VITAMIN D) 400 units TABS tablet Take 1,000 Units by mouth daily.   Yes [provider]  [facility] Liver Oil w/Vit A & D CAPS Take 1 capsule by mouth daily.   Yes [provider]  ferrous gluconate (FERGON) 324 MG tablet Take 324 mg by mouth daily with breakfast.   Yes [provider]  furosemide (LASIX) 40 MG tablet Take 40 mg by mouth daily.   Yes [provider]  glimepiride (AMARYL) 2 MG tablet Take 2 mg by mouth daily with breakfast.   Yes [provider]  hydrALAZINE (APRESOLINE) 25 MG tablet Take 25 mg by mouth 2 (two) times daily. 04/06/21 04/06/22 Yes  [provider]  isosorbide mononitrate (IMDUR) 30 MG 24 hr tablet Take 30 mg by mouth daily. 04/06/21 04/06/22 Yes [provider]  levocetirizine (XYZAL) 5 MG tablet Take 5 mg by mouth every evening.   Yes [provider]  metFORMIN (GLUCOPHAGE) 500 MG tablet Take 500 mg by mouth 2 (two) times daily with a meal.   Yes [provider]  metoprolol succinate (TOPROL-XL) 100 MG 24 hr tablet Take 100 mg by mouth daily. 06/18/21  Yes [provider]  montelukast (SINGULAIR) 10 MG tablet Take  10 mg by mouth at bedtime.   Yes [provider]  Multiple Vitamin (MULTIVITAMIN) tablet Take 1 tablet by mouth daily.    Yes [provider]  Omega 3-6-9 Fatty Acids (TRIPLE OMEGA-3-6-9) CAPS Take 1 capsule by mouth daily.   Yes [provider]  pantoprazole (PROTONIX) 40 MG tablet Take 40 mg by mouth 2 (two) times daily.   Yes [provider]  potassium chloride (K-DUR) 10 MEQ tablet TAKE 1 TABLET BY MOUTH ONCE DAILY 09/18/17  Yes Darylene Price A, FNP  rosuvastatin (CRESTOR) 20 MG tablet Take 20 mg by mouth daily.   Yes [provider]  sucralfate (CARAFATE) 1 g tablet Take 1 g by mouth 2 (two) times daily. TAKE 1 TABLET BY MOUTH TWICE DAILY BEFORE MEALS 06/21/21  Yes [provider]  traMADol-acetaminophen (ULTRACET) 37.5-325 MG tablet Take 1 tablet by mouth every 6 (six) hours as needed. Takes twice a day   Yes [provider]  traZODone (DESYREL) 100 MG tablet Take 100 mg by mouth at bedtime.   Yes [provider]  vitamin B-12 (CYANOCOBALAMIN) 500 MCG tablet Take 1,000 mcg by mouth daily.   Yes [provider]  vitamin C (ASCORBIC ACID) 500 MG tablet Take 500 mg by mouth daily.    Yes [provider]  BIDIL 20-37.5 MG tablet TAKE 1 TABLET BY MOUTH THREE TIMES DAILY Patient not taking: Reported on 07/30/2021 05/20/17   Alisa Graff, FNP  ENTRESTO 97-103 MG TAKE 1 TABLET BY MOUTH  TWICE DAILY Patient not taking: Reported on 08/16/2020 09/18/17   Alisa Graff, FNP  fluticasone [facility]) 50 MCG/ACT nasal spray Place into the nose. 01/30/19 08/16/20  [provider]  Fluticasone-Salmeterol (ADVAIR) 500-50 MCG/DOSE AEPB Inhale 1 puff into the lungs 2 (two) times daily. Patient not taking: Reported on 07/30/2021    [provider]  Iron-Vitamin C (VITRON-C) 65-125 MG TABS Take 1 tablet by mouth daily. Patient not taking: Reported on 07/30/2021 10/08/19   Earlie Server, MD  meloxicam (MOBIC) 7.5 MG tablet Take 7.5 mg by mouth 2 (two) times daily as needed for pain. Takes one every other day Patient not taking: Reported on 07/30/2021    [provider]  metoprolol (TOPROL-XL) 200 MG 24 hr tablet TAKE 1 TABLET BY MOUTH ONCE DAILY. TAKE WITH OR IMMEDIATELY FOLLOWING A MEAL. Patient not taking: Reported on 11/29/2020 05/20/17   Alisa Graff, FNP  omeprazole (PRILOSEC) 20 MG capsule Take 20 mg by mouth 2 (two) times daily before a meal. Patient not taking: Reported on 07/30/2021    [provider]  ondansetron (ZOFRAN-ODT) 4 MG disintegrating tablet Take 4 mg every 8 (eight) hours as needed by mouth for nausea or vomiting. Patient not taking: Reported on 07/30/2021    [provider]  predniSONE (DELTASONE) 10 MG tablet Take by mouth. Patient not taking: Reported on 08/16/2020 06/01/19   [provider]  Semaglutide,0.25 or 0.'5MG'$ /DOS, (OZEMPIC, 0.25 OR 0.5 MG/DOSE,) 2 MG/1.5ML SOPN Inject 0.375 mLs into the skin once a week. Patient not taking: Reported on 04/08/2020 10/22/18   [provider]  Tiotropium Bromide Monohydrate 1.25 MCG/ACT AERS Inhale 2 puffs into the lungs daily. Patient not taking: Reported on 07/30/2021    [provider]   CT TEMPORAL BONES WO CONTRAST  Result Date: 08/08/2021 CLINICAL DATA:  Mastoiditis EXAM: CT TEMPORAL BONES WITHOUT CONTRAST TECHNIQUE: Axial and coronal plane CT imaging of the petrous  temporal bones was performed with thin-collimation  image reconstruction. No intravenous contrast was administered. Multiplanar CT image reconstructions were also generated. RADIATION DOSE REDUCTION: This exam was performed according to the departmental dose-optimization program which includes automated exposure control, adjustment of the mA and/or kV according to patient size and/or use of iterative reconstruction technique. COMPARISON:  None Available. FINDINGS: RIGHT TEMPORAL BONE External auditory canal: Unremarkable. Tympanic membrane is not well evaluated. Middle ear cavity: Opacified. Ossicles are intact. There is poor evaluation of the stapes footplate due to adjacent increased density. Tegmen tympani appears intact. Inner ear structures: Thin or dehiscent roof of the superior semicircular canal. Cochlea and semicircular canals are otherwise unremarkable. Internal auditory and facial nerve canals:  Unremarkable. Mastoid air cells: Opacified without erosive changes. Thin or dehiscent tegmen mastoideum. LEFT TEMPORAL BONE External auditory canal: Unremarkable. Tympanic membrane is poorly evaluated. Middle ear cavity: Opacified. Ossicles appear intact with suboptimal visualization of the stapes footplate due to surrounding increased density. Possible focal thinning or dehiscence of the tegmen tympani. Inner ear structures: Thin or dehiscent roof of the superior semicircular canal. Cochlea and semicircular canals are otherwise unremarkable. Internal auditory and facial nerve canals:  Unremarkable. Mastoid air cells: Opacified without erosive changes. Thinning or dehiscence of tegmen mastoideum. Vascular: Normal non-contrast appearance of the carotid canals, jugular bulbs and sigmoid plates. Limited intracranial:  Unremarkable. Visible orbits/paranasal sinuses: Unremarkable included orbits. Evidence of prior sinonasal surgery with mild mucosal thickening. Soft tissues: No inflammatory changes adjacent to the  mastoids. IMPRESSION: Nonspecific bilateral mastoid and middle ear opacification without erosive changes may reflect serous otomastoiditis. No soft tissue abscess. There is thinning or dehiscence of the tegmen mastoideum bilaterally and possibly the left tegmen tympani. Bilaterally thin or dehiscent roof of the superior semicircular canals. Electronically Signed   By: Macy Mis M.D.   On: 08/08/2021 16:05   DG Knee 1-2 Views Left  Result Date: 08/07/2021 CLINICAL DATA:  Bilateral knee pain. EXAM: LEFT KNEE - 1-2 VIEW; RIGHT KNEE - 1-2 VIEW COMPARISON:  CT of the left knee 07/30/2013. FINDINGS: Right knee: The bones are demineralized. There is no evidence of acute fracture or dislocation. There are tricompartmental degenerative changes which are most advanced in the lateral compartment. Mild resulting genu valgus deformity. There is a large knee joint effusion without definite layering intra-articular fat. Meniscal chondrocalcinosis and vascular calcifications are noted. Left knee: The bones are demineralized. No evidence of acute fracture or dislocation. There are tricompartmental degenerative changes which are most advanced in the patellofemoral compartment. Small knee joint effusion, meniscal chondrocalcinosis and vascular calcifications are noted. IMPRESSION: 1. No definite acute osseous findings within either knee. 2. Nonspecific large right and small left knee joint effusions. 3. 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 Electronically Signed   By: Richardean Sale M.D.   On: 08/07/2021 11:12   DG Knee 1-2 Views Right  Result Date: 08/07/2021 CLINICAL DATA:  Bilateral knee pain. EXAM: LEFT KNEE - 1-2 VIEW; RIGHT KNEE - 1-2 VIEW COMPARISON:  CT of the left knee 07/30/2013. FINDINGS: Right knee: The bones are demineralized. There is no evidence of acute fracture or dislocation. There are tricompartmental degenerative changes which are most advanced in the lateral compartment.  Mild resulting genu valgus deformity. There is a large knee joint effusion without definite layering intra-articular fat. Meniscal chondrocalcinosis and vascular calcifications are noted. Left knee: The bones are demineralized. No evidence of acute fracture or dislocation. There are tricompartmental degenerative changes which are most advanced in the patellofemoral compartment. Small knee joint effusion, meniscal  chondrocalcinosis and vascular calcifications are noted. IMPRESSION: 1. No definite acute osseous findings within either knee. 2. Nonspecific large right and small left knee joint effusions. 3. 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 Electronically Signed   By: Richardean Sale M.D.   On: 08/07/2021 11:12    Positive ROS: All other systems have been reviewed and were otherwise negative with the exception of those mentioned in the HPI and as above.  Physical Exam: General: Alert, no acute distress  MUSCULOSKELETAL:  Right knee: Patient has a large effusion without erythema or ecchymosis.  Patient has limited range of motion and has flexion contracture of approximately 10 to 15 degrees.  She can flex to approximately 90 degrees.  Patient has no calf tenderness or lower leg edema and is distally neurovascular intact.  She had tenderness over the medial and lateral joint lines.  Left knee: Patient has a moderate effusion without erythema or ecchymosis.  Patient has limited range of motion including a 15 degree flexion contracture.  She can flex to approximately 85 to 90 degrees.  She has no calf tenderness or lower leg edema and is distally neurovascular intact.  She has tenderness over the medial and lateral joint lines.  Assessment: Bilateral knee pain and effusions secondary to pseudogout  Plan: Orthopedics is consulted for aspiration of the patient's right knee.  However on examination today the patient had significant effusions in both knees.  The decision was made  to aspirate both knees and sent the fluid for cell count, Gram stain and culture.  The left knee demonstrates extracellular Calcium Pyrophosphate Crystals with a Eyer Blood Cell Count of 11,626.  The right knee demonstrates a cell count of 15,021 with intracellular calcium pyrophosphate crystals.  Based on these results the patient does not have septic knee joints.  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  Patient is not a candidate for intra-articular injection of corticosteroid at this time given her recent sepsis.  Patient may follow-up as an outpatient with her primary care physician or rheumatology for treatment of her pseudogout.  Patient has advanced arthritis in both knees and she may follow-up with orthopedics as an outpatient for management of bilateral knee osteoarthritis.  Patient may continue with physical therapy as tolerated.   Thornton Park, MD    08/08/2021 8:25 PM

## 2021-08-08 NOTE — Consult Note (Signed)
Diane Patton, Mol [id number] 16-May-1945 Diane Dusky, MD  Reason for Consult: Recent diagnosis of meningitis with strep pneumo, question if ENT source  HPI: 76 year old female well-known to me as a patient of mine in ENT ENT for multiple years.  She has had a long history of chronic otitis media with effusion and has had multiple ear tubes placed in the past.  Her tubes have been out for some time and I have not seen her since the beginning of this year.  Upon questioning today she denies any significant ear pain, no change in her hearing that she noted prior to this event.  There was a note of some drainage from the left ear of blood, however family reported that she had been cleaning it with a hairpin.  She currently is in no pain and does not report any significant loss in her hearing.  Her only complaint today is knee pain  Allergies:  Allergies  Allergen Reactions   Amoxicillin Other (See Comments)    GI distress   Aspirin Diarrhea and Nausea And Vomiting   Celebrex [Celecoxib] Nausea Only   Latex Itching and Swelling   Penicillins Other (See Comments)    GI distress   Salicylates    Allegra [Fexofenadine] Cough    ROS: Review of systems normal other than 12 systems except per HPI.  PMH:  Past Medical History:  Diagnosis Date   Asthma    Cardiomyopathy, nonischemic (HCC)    CHF (congestive heart failure) (HCC)    COPD (chronic obstructive pulmonary disease) (HCC)    Diabetes mellitus, type 2 (HCC)    Gastric ulcer without hemorrhage or perforation    GERD (gastroesophageal reflux disease)    HTN (hypertension)    Hyperlipidemia    IDA (iron deficiency anemia) 04/05/2020   Obstructive sleep apnea on CPAP    Osteoarthritis    osteo of both knees   Scoliosis     FH:  Family History  Problem Relation Age of Onset   Alzheimer's disease Other    Heart attack Mother    Hypertension Mother    Cancer Brother        lung   Cancer Maternal Aunt        mouth and breast    Breast cancer Maternal Aunt    Cancer Daughter        throat   Dementia Father     SH:  Social History   Socioeconomic History   Marital status: Married    Spouse name: Not on file   Number of children: Not on file   Years of education: Not on file   Highest education level: Not on file  Occupational History   Occupation: retired  Tobacco Use   Smoking status: Former    Packs/day: 1.00    Years: 15.00    Total pack years: 15.00    Types: Cigarettes    Quit date: 07/21/1982    Years since quitting: 39.0   Smokeless tobacco: Never  Vaping Use   Vaping Use: Never used  Substance and Sexual Activity   Alcohol use: No    Alcohol/week: 15.0 standard drinks of alcohol    Types: 15 Standard drinks or equivalent per week    Comment: quit drinking 04/1981   Drug use: No   Sexual activity: Not on file  Other Topics Concern   Not on file  Social History Narrative   Disabled. Regularly exercises.    Social Determinants of Health   Financial  Resource Strain: Not on file  Food Insecurity: Not on file  Transportation Needs: Not on file  Physical Activity: Not on file  Stress: Not on file  Social Connections: Not on file  Intimate Partner Violence: Not on file    PSH:  Past Surgical History:  Procedure Laterality Date   ABDOMINAL HYSTERECTOMY     ankle (other)     APPENDECTOMY     BREAST BIOPSY Right 1999   neg   BREAST CYST ASPIRATION     neg   BREAST SURGERY     CARDIAC CATHETERIZATION     CLAVICLE SURGERY Right    Fx   COLONOSCOPY WITH PROPOFOL N/A 07/10/2016   Procedure: COLONOSCOPY WITH PROPOFOL;  Surgeon: Lollie Sails, MD;  Location[facility] ENDOSCOPY;  Service: Endoscopy;  Laterality: N/A;   ear (otheR)     ESOPHAGOGASTRODUODENOSCOPY N/A 05/11/2019   Procedure: ESOPHAGOGASTRODUODENOSCOPY (EGD);  Surgeon: Toledo, Benay Pike, MD;  Location: [facility] ENDOSCOPY;  Service: Gastroenterology;  Laterality: N/A;   ESOPHAGOGASTRODUODENOSCOPY (EGD) WITH PROPOFOL N/A  07/10/2016   Procedure: ESOPHAGOGASTRODUODENOSCOPY (EGD) WITH PROPOFOL;  Surgeon: Lollie Sails, MD;  Location: [facility] ENDOSCOPY;  Service: Endoscopy;  Laterality: N/A;   ESOPHAGOGASTRODUODENOSCOPY (EGD) WITH PROPOFOL N/A 09/17/2017   Procedure: ESOPHAGOGASTRODUODENOSCOPY (EGD) WITH PROPOFOL;  Surgeon: Lollie Sails, MD;  Location: [facility] ENDOSCOPY;  Service: Endoscopy;  Laterality: N/A;   feet (other)     HERNIA REPAIR     sinus (other)     stomach     UPPER ESOPHAGEAL ENDOSCOPIC ULTRASOUND (EUS) N/A 11/22/2016   Procedure: UPPER ESOPHAGEAL ENDOSCOPIC ULTRASOUND (EUS);  Surgeon: Reita Cliche, MD;  Location: [facility] ENDOSCOPY;  Service: Gastroenterology;  Laterality: N/A;    Physical  Exam: Patient is awake and alert sitting up in the bed answering questions appropriately.  The anterior nose is widely patent and the oral cavity oropharynx is benign.  There is no evidence of mastoid tenderness or fluctuance.  The left ear canal has a small scratch in the canal with old dried blood.  Both tympanic membranes were intact it does appear that she has effusions bilaterally but they do not appear to be acutely infected, there is no inflammatory change, and no bulging of the eardrum.   A/P: Meningitis with a blood culture growing strep pneumo susceptible to both Rocephin and oral Levaquin.  She has been on Rocephin for the last 8 days while she has been in the hospital.  A temporal bone CT was done today she does have opacification of both middle ear spaces as well as the majority of both mastoids however there is some air within several of the mastoid air cells.  There is no coalescence of the mastoid, and no evidence of mastoid abscess.  There was a question of some thinning of the tegmen bilaterally and possible dehiscence, however when I reviewed the CT scan myself this is difficult to ascertain for certain whether there is a true dehiscence.  I have spoken with Dr. Steva Ready about the patient.  I do  not think that she needs any acute surgery at this point.  In review of her old scans including an MRI from 2017 she had the same mastoid opacification at that point, and this has been an ongoing issue with her for many years.  It is certainly possible that she developed an acute infection of 1 or both of her middle ear mastoid region which has led to this meningitis.  This bacteria is a common pathogen of the  sinuses and in the middle ear space.  I will discuss this case with otologist at [facility], it is certainly possible that placement of tubes within the tympanic membrane to allow drainage in the future may be warranted.  This would require general anesthetic to place the butterfly tubes in her ears and with acute meningitis I do not think that general anesthetic would be safe.  She is currently in no pain no significant change in her hearing and a exam of the does not show any acute infection of the middle ear space.  Dr. Steva Ready has recommended 14 days at least of IV antibiotic for the meningitis.  She is planning to have a PICC line placed for outpatient ministration of the antibiotic.  I have recommended a follow-up with me as an outpatient and we can discuss possible placement of ear tubes in the near future.  If her symptoms worsen feel free to call me back and I will relay any information from the otologist to Dr. Steva Ready as needed.   Roena Malady 08/08/2021 5:24 PM

## 2021-08-08 NOTE — Progress Notes (Signed)
Physical Therapy Treatment Patient Details Name: Diane Patton MRN: [id number] DOB: 14-Aug-1945 Today's Date: 08/08/2021   History of Present Illness Diane Patton is a 63yoF who comes to [facility] on 07/30/21 with AMS at home. Pt admitted in sepsis of unknown origin. PMH: sCHF, NICM, NIDDM2, HTN, COPD, CKD3b, anemia, OSA. Pt required mechanical ventilation through 6/14, extuabted to 4L. ID following for bacterial meningitis. Pt having severe bilat knee pain exacerbation starting around 6/18.    PT Comments    Pt in bed, meal eaten, asking about getting up with PT today. Granddaughter in room throughout. Assisted with bed-level leg exercises, ROM improved, pain improved, but force production only marginally improved on Rt side- left leg remains exceptionally weak. Both knees appear to be more edematous compared to previous day. Pt noted to be soaked in urine/sweat- pure wick malfunction. Pt assisted to EOB, then to recliner, stripped wet linen, assisted with set up in chair. Pt unable to contribute much force for transfers, but follows commands and participates despite pain in knees.     Recommendations for follow up therapy are one component of a multi-disciplinary discharge planning process, led by the attending physician.  Recommendations may be updated based on patient status, additional functional criteria and insurance authorization.  Follow Up Recommendations  Skilled nursing-short term rehab (<3 hours/day)     Assistance Recommended at Discharge Frequent or constant Supervision/Assistance  Patient can return home with the following Two people to help with bathing/dressing/bathroom;Two people to help with walking and/or transfers;Help with stairs or ramp for entrance   Equipment Recommendations  None recommended by PT    Recommendations for Other Services       Precautions / Restrictions Precautions Precautions: Fall Restrictions Weight Bearing Restrictions: No     Mobility  Bed  Mobility Overal bed mobility: Needs Assistance Bed Mobility: Supine to Sit     Supine to sit: Mod assist     General bed mobility comments: good seated balance    Transfers Overall transfer level: Needs assistance Equipment used: Rolling walker (2 wheels) Transfers: Sit to/from Stand, Bed to chair/wheelchair/BSC Sit to Stand: Max assist Stand pivot transfers: Max assist, +2 physical assistance              Ambulation/Gait                   Stairs             Wheelchair Mobility    Modified Rankin (Stroke Patients Only)       Balance                                            Cognition Arousal/Alertness: Awake/alert Behavior During Therapy: Flat affect Overall Cognitive Status: Difficult to assess                                          Exercises General Exercises - Lower Extremity Short Arc Quad: Right, AAROM, 10 reps, Supine, Limitations Short Arc Quad Limitations: unable on Left Long Arc Quad: 10 reps, Seated, Right, AROM Heel Slides: AAROM, Both, 10 reps, Supine Hip ABduction/ADduction: AROM, Right, 10 reps, Supine Mini-Sqauts: Right, Strengthening, 10 reps, Supine    General Comments        Pertinent Vitals/Pain Pain Assessment Pain Assessment:  Faces Faces Pain Scale: Hurts even more Pain Location: bilat knees with certain movements, Left > Right    Home Living Family/patient expects to be discharged to:: Private residence Living Arrangements: Spouse/significant other (Adult son (disabled)) Available Help at Discharge: Family Type of Home: House Home Access: Stairs to enter Entrance Stairs-Rails: Right Entrance Stairs-Number of Steps: 2   Home Layout: One level Home Equipment: Cane - single Barista (2 wheels);BSC/3in1      Prior Function            PT Goals (current goals can now be found in the care plan section) Progress towards PT goals: Progressing toward  goals    Frequency    Min 2X/week      PT Plan Current plan remains appropriate    Co-evaluation              AM-PAC PT "6 Clicks" Mobility   Outcome Measure  Help needed turning from your back to your side while in a flat bed without using bedrails?: Total Help needed moving from lying on your back to sitting on the side of a flat bed without using bedrails?: Total Help needed moving to and from a bed to a chair (including a wheelchair)?: Total Help needed standing up from a chair using your arms (e.g., wheelchair or bedside chair)?: Total Help needed to walk in hospital room?: Total Help needed climbing 3-5 steps with a railing? : Total 6 Click Score: 6    End of Session   Activity Tolerance: Patient tolerated treatment well;Patient limited by pain;Patient limited by fatigue Patient left: with call bell/phone within reach;in chair;with nursing/sitter in room;with family/visitor present Nurse Communication: Mobility status PT Visit Diagnosis: Difficulty in walking, not elsewhere classified (R26.2);Other abnormalities of gait and mobility (R26.89);Muscle weakness (generalized) (M62.81)     Time: 9622-2979 PT Time Calculation (min) (ACUTE ONLY): 57 min  Charges:  $Therapeutic Exercise: 23-37 mins $Therapeutic Activity: 23-37 mins                    12:35 PM, 08/08/21 Etta Grandchild, PT, DPT Physical Therapist - [facility]  [phone] ([place])     Ajeenah Heiny C 08/08/2021, 12:28 PM

## 2021-08-09 DIAGNOSIS — N1832 Chronic kidney disease, stage 3b: Secondary | ICD-10-CM

## 2021-08-09 DIAGNOSIS — R7881 Bacteremia: Secondary | ICD-10-CM | POA: Diagnosis not present

## 2021-08-09 DIAGNOSIS — D631 Anemia in chronic kidney disease: Secondary | ICD-10-CM

## 2021-08-09 DIAGNOSIS — I5022 Chronic systolic (congestive) heart failure: Secondary | ICD-10-CM

## 2021-08-09 DIAGNOSIS — E119 Type 2 diabetes mellitus without complications: Secondary | ICD-10-CM

## 2021-08-09 DIAGNOSIS — G9341 Metabolic encephalopathy: Secondary | ICD-10-CM | POA: Diagnosis not present

## 2021-08-09 DIAGNOSIS — A419 Sepsis, unspecified organism: Secondary | ICD-10-CM | POA: Diagnosis not present

## 2021-08-09 DIAGNOSIS — M1120 Other chondrocalcinosis, unspecified site: Secondary | ICD-10-CM

## 2021-08-09 DIAGNOSIS — G001 Pneumococcal meningitis: Secondary | ICD-10-CM

## 2021-08-09 LAB — COMPREHENSIVE METABOLIC PANEL
ALT: 93 U/L — ABNORMAL HIGH (ref 0–44)
AST: 69 U/L — ABNORMAL HIGH (ref 15–41)
Albumin: 2.4 g/dL — ABNORMAL LOW (ref 3.5–5.0)
Alkaline Phosphatase: 73 U/L (ref 38–126)
Anion gap: 4 — ABNORMAL LOW (ref 5–15)
BUN: 12 mg/dL (ref 8–23)
CO2: 29 mmol/L (ref 22–32)
Calcium: 9.6 mg/dL (ref 8.9–10.3)
Chloride: 104 mmol/L (ref 98–111)
Creatinine, Ser: 1.09 mg/dL — ABNORMAL HIGH (ref 0.44–1.00)
GFR, Estimated: 52 mL/min — ABNORMAL LOW (ref >60)
Glucose, Bld: 120 mg/dL — ABNORMAL HIGH (ref 70–99)
Potassium: 4.3 mmol/L (ref 3.5–5.1)
Sodium: 137 mmol/L (ref 135–145)
Total Bilirubin: 0.4 mg/dL (ref 0.3–1.2)
Total Protein: 6.1 g/dL — ABNORMAL LOW (ref 6.5–8.1)

## 2021-08-09 LAB — FERRITIN: Ferritin: 528 ng/mL — ABNORMAL HIGH (ref 11–307)

## 2021-08-09 LAB — MAGNESIUM: Magnesium: 1.9 mg/dL (ref 1.7–2.4)

## 2021-08-09 LAB — CBC
HCT: 23.5 % — ABNORMAL LOW (ref 36.0–46.0)
Hemoglobin: 7.3 g/dL — ABNORMAL LOW (ref 12.0–15.0)
MCH: 30.2 pg (ref 26.0–34.0)
MCHC: 31.1 g/dL (ref 30.0–36.0)
MCV: 97.1 fL (ref 80.0–100.0)
Platelets: 285 10*3/uL (ref 150–400)
RBC: 2.42 MIL/uL — ABNORMAL LOW (ref 3.87–5.11)
RDW: 13 % (ref 11.5–15.5)
WBC: 9 10*3/uL (ref 4.0–10.5)
nRBC: 0 % (ref 0.0–0.2)

## 2021-08-09 LAB — GLUCOSE, CAPILLARY
Glucose-Capillary: 110 mg/dL — ABNORMAL HIGH (ref 70–99)
Glucose-Capillary: 160 mg/dL — ABNORMAL HIGH (ref 70–99)
Glucose-Capillary: 136 mg/dL — ABNORMAL HIGH (ref 70–99)
Glucose-Capillary: 202 mg/dL — ABNORMAL HIGH (ref 70–99)

## 2021-08-09 LAB — PHOSPHORUS: Phosphorus: 3.8 mg/dL (ref 2.5–4.6)

## 2021-08-09 MED ORDER — COLCHICINE 0.6 MG PO TABS
0.6000 mg | ORAL_TABLET | Freq: Every day | ORAL | Status: DC
  Administered 2021-08-09 – 2021-08-10 (×2): 0.6 mg via ORAL
  Filled 2021-08-09 (×2): qty 1

## 2021-08-09 NOTE — Plan of Care (Signed)
  Problem: Clinical Measurements: Goal: Signs and symptoms of infection will decrease Outcome: Progressing   Problem: Respiratory: Goal: Ability to maintain adequate ventilation will improve Outcome: Progressing   Problem: Health Behavior/Discharge Planning: Goal: Ability to manage health-related needs will improve Outcome: Progressing   Problem: Clinical Measurements: Goal: Cardiovascular complication will be avoided Outcome: Progressing   Problem: Pain Managment: Goal: General experience of comfort will improve Outcome: Progressing   Problem: Safety: Goal: Ability to remain free from injury will improve Outcome: Progressing

## 2021-08-09 NOTE — Assessment & Plan Note (Addendum)
Continue IV Rocephin through 08/16/2021 then will be on p.o. Levaquin after that

## 2021-08-09 NOTE — Assessment & Plan Note (Addendum)
Continue Rocephin through 08/16/2021 and will be on p.o. Levaquin after that.

## 2021-08-09 NOTE — Progress Notes (Addendum)
Patient working with physical therapy today for bilateral knee range of motion.  Patient's bilateral knee pain has improved following aspiration.  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  There is no evidence of septic knee joints at this time and she is tolerating knee motion today with physical therapy without significant pain.  Her Gram stains demonstrate no organisms.  Synovial cultures are negative less than 24 hours status post collection.    Patient may weight-bear as tolerated on her bilateral lower extremities.  Patient should continue physical therapy as tolerated.    No indication for orthopedic surgery at this time.    I would not recommend intra-articular injection of corticosteroid injections for either knee given her recent history of sepsis.  Patient may be a candidate for corticosteroid injections in the future once she has completed her course of antibiotics and recovered from her pneumococcal bacteremia and meningitis.    Will sign off for now.

## 2021-08-09 NOTE — Progress Notes (Signed)
Date of Admission:  07/30/2021   ID: Diane Patton is a 76 y.o. female  Principal Problem:   Sepsis ([place]) Active Problems:   Essential hypertension   Chronic systolic heart failure (HCC)   Diabetes mellitus, type 2 (HCC)   Chronic obstructive pulmonary disease (HCC)   Cardiomyopathy, nonischemic (HCC)   Anemia in chronic kidney disease   Stage 3b chronic kidney disease (HCC)   Acute metabolic encephalopathy   Acute respiratory failure with hypoxia (HCC)    Subjective: Pt doing better No earache No fever Knee pain better  Medications:   Chlorhexidine Gluconate Cloth  6 each Topical Daily   enoxaparin (LOVENOX) injection  40 mg Subcutaneous Q24H   feeding supplement  237 mL Oral TID BM   insulin aspart  0-5 Units Subcutaneous QHS   insulin aspart  0-9 Units Subcutaneous TID AC   insulin detemir  8 Units Subcutaneous BID   multivitamin with minerals  1 tablet Oral Daily   pantoprazole  40 mg Oral Daily    Objective: Vital signs in last 24 hours: Temp:  [97.7 F (36.5 C)-99.4 F (37.4 C)] 97.7 F (36.5 C) (06/21 1135) Pulse Rate:  [91-104] 91 (06/21 1135) Resp:  [15-20] 15 (06/21 1135) BP: (139-154)/(56-100) 154/63 (06/21 1135) SpO2:  [98 %-100 %] 100 % (06/21 1135) Weight:  [81.4 kg] 81.4 kg (06/21 0300)   PHYSICAL EXAM:  General: Alert, cooperative, no distress, appears stated age.  hard of hearing Head: Normocephalic, without obvious abnormality, atraumatic. Eyes: Conjunctivae clear, anicteric sclerae. Pupils are equal ENT Nares normal. No drainage or sinus tenderness. Lips, mucosa, and tongue normal. No Thrush Neck: Supple, symmetrical, no adenopathy, thyroid: non tender no carotid bruit and no JVD. Lungs: Clear to auscultation bilaterally. No Wheezing or Rhonchi. No rales. Heart: Regular rate and rhythm, no murmur, rub or gallop. Abdomen: Soft, non-tender,not distended. Bowel sounds normal. No masses Extremities: atraumatic, no cyanosis. No edema. No  clubbing Skin: No rashes or lesions. Or bruising Lymph: Cervical, supraclavicular normal. Neurologic: Grossly non-focal  Lab Results Recent Labs    08/08/21 0628 08/09/21 0646  WBC 11.0* 9.0  HGB 7.9* 7.3*  HCT 25.3* 23.5*  NA 139 137  K 3.9 4.3  CL 104 104  CO2 28 29  BUN 12 12  CREATININE 1.07* 1.09*   Liver Panel Recent Labs    08/08/21 0628 08/09/21 0646  PROT 6.5 6.1*  ALBUMIN 2.6* 2.4*  AST 24 69*  ALT 60* 93*  ALKPHOS 61 73  BILITOT 0.7 0.4    Microbiology:  Studies/Results: CT TEMPORAL BONES WO CONTRAST  Result Date: 08/08/2021 CLINICAL DATA:  Mastoiditis EXAM: CT TEMPORAL BONES WITHOUT CONTRAST TECHNIQUE: Axial and coronal plane CT imaging of the petrous temporal bones was performed with thin-collimation image reconstruction. No intravenous contrast was administered. Multiplanar CT image reconstructions were also generated. RADIATION DOSE REDUCTION: This exam was performed according to the departmental dose-optimization program which includes automated exposure control, adjustment of the mA and/or kV according to patient size and/or use of iterative reconstruction technique. COMPARISON:  None Available. FINDINGS: RIGHT TEMPORAL BONE External auditory canal: Unremarkable. Tympanic membrane is not well evaluated. Middle ear cavity: Opacified. Ossicles are intact. There is poor evaluation of the stapes footplate due to adjacent increased density. Tegmen tympani appears intact. Inner ear structures: Thin or dehiscent roof of the superior semicircular canal. Cochlea and semicircular canals are otherwise unremarkable. Internal auditory and facial nerve canals:  Unremarkable. Mastoid air cells: Opacified without erosive changes. Thin or  dehiscent tegmen mastoideum. LEFT TEMPORAL BONE External auditory canal: Unremarkable. Tympanic membrane is poorly evaluated. Middle ear cavity: Opacified. Ossicles appear intact with suboptimal visualization of the stapes footplate due to  surrounding increased density. Possible focal thinning or dehiscence of the tegmen tympani. Inner ear structures: Thin or dehiscent roof of the superior semicircular canal. Cochlea and semicircular canals are otherwise unremarkable. Internal auditory and facial nerve canals:  Unremarkable. Mastoid air cells: Opacified without erosive changes. Thinning or dehiscence of tegmen mastoideum. Vascular: Normal non-contrast appearance of the carotid canals, jugular bulbs and sigmoid plates. Limited intracranial:  Unremarkable. Visible orbits/paranasal sinuses: Unremarkable included orbits. Evidence of prior sinonasal surgery with mild mucosal thickening. Soft tissues: No inflammatory changes adjacent to the mastoids. IMPRESSION: Nonspecific bilateral mastoid and middle ear opacification without erosive changes may reflect serous otomastoiditis. No soft tissue abscess. There is thinning or dehiscence of the tegmen mastoideum bilaterally and possibly the left tegmen tympani. Bilaterally thin or dehiscent roof of the superior semicircular canals. Electronically Signed   By: Macy Mis M.D.   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  Hemorrhage rt temporal lobe Seen by ENT - management as OP for possible butterfly drains Continue ceftriaxone 2 grams Iv Q 12 until 08/16/21  CPPD arthropathy b/l knees- could be causing t intermittent fever  AKI    Anemia  Discussed the management with the patient and care team

## 2021-08-09 NOTE — Assessment & Plan Note (Signed)
We will start low-dose colchicine.

## 2021-08-09 NOTE — Progress Notes (Signed)
Physical Therapy Treatment Patient Details Name: Diane Patton MRN: [id number] DOB: [birth year] Today's Date: 08/09/2021   History of Present Illness Nayely Dingus is a 1yoF who comes to [facility] on 07/30/21 with AMS at home. Pt admitted in sepsis of unknown origin. PMH: sCHF, NICM, NIDDM2, HTN, COPD, CKD3b, anemia, OSA. Pt required mechanical ventilation through 6/14, extuabted to 4L. ID following for bacterial meningitis. Pt having severe bilat knee pain exacerbation starting around 6/18, now followed by orthopedic for what has been consistent with pseudogout.    PT Comments    Pt in bed on arrival, awake, agreeable to session. Pt continues to stay motivated to regain her ability to mobilize despite ongoing pain and pseudogout in bilat knees. ROM is better tolerated today than previous 2 days, ability to produce force also improved. After bed level exercises, IV team arrives to fabricate an access. Author returns to move on to bed mobility/functional mobility. ModA to EOB, pt veyr steady at EOB, good trunk control. Attempted 4x STS transfers with heavy mod to max Assist, pt not able to fully come up, however noticed subsequent ability to clear buttock from bed while weight shifting ad lib. Moved to later scoot training at EOB, entire length bilat, elevated EOB and falls mat for improved foot comfort and grip. Breaks taken to allow for HR recovery which goes to high 120s. FOB is removed for lateral scoot into drop arm recliner, NA x2 asked to come watch so they can assist back to bed later in day. Pt left up in chair at EOS, all needs met.     Recommendations for follow up therapy are one component of a multi-disciplinary discharge planning process, led by the attending physician.  Recommendations may be updated based on patient status, additional functional criteria and insurance authorization.  Follow Up Recommendations  Skilled nursing-short term rehab (<3 hours/day)     Assistance Recommended at  Discharge Frequent or constant Supervision/Assistance  Patient can return home with the following Two people to help with bathing/dressing/bathroom;Two people to help with walking and/or transfers;Help with stairs or ramp for entrance   Equipment Recommendations  None recommended by PT    Recommendations for Other Services       Precautions / Restrictions Precautions Precautions: Fall Restrictions Weight Bearing Restrictions: No     Mobility  Bed Mobility Overal bed mobility: Needs Assistance Bed Mobility: Supine to Sit     Supine to sit: Mod assist     General bed mobility comments: excellent seated balance throughout    Transfers Overall transfer level: Needs assistance   Transfers: Sit to/from Stand, Bed to chair/wheelchair/BSC Sit to Stand: Mod assist          Lateral/Scoot Transfers: Supervision, From elevated surface (able to perform entire length of bed both directions, then into droparm chair at FOB) General transfer comment: heavy modA, does not appear to be able to get fully upright    Ambulation/Gait                   Stairs             Wheelchair Mobility    Modified Rankin (Stroke Patients Only)       Balance                                            Cognition Arousal/Alertness: Awake/alert Behavior During Therapy: [facility]  for tasks assessed/performed Overall Cognitive Status: Within Functional Limits for tasks assessed                                          Exercises General Exercises - Lower Extremity Ankle Circles/Pumps: PROM, Left, 20 reps, Supine, Limitations Ankle Circles/Pumps Limitations: It seems her Rt ankle is likely s/p arthrodesis/arthrofibrosis (pt reports is poor) but s/p ORIF/trauma at age 57, does not move well. Short Arc Quad: AAROM, Supine, PROM, Both, 20 reps (clear improvements in tolerance and ROM over time) Heel Slides: AAROM, Both, Supine, PROM, 20 reps (clear  improvements in tolerance and ROM over time) Other Exercises Other Exercises: manually resisted Leg press 2x15 on Right (improve force production since yesterday); 1x15 on left (able to push against ~3-4lb resistance)    General Comments        Pertinent Vitals/Pain Pain Assessment Pain Assessment: Faces Faces Pain Scale: Hurts little more Pain Location: bilat knees with certain movements, Left > Right    Home Living                          Prior Function            PT Goals (current goals can now be found in the care plan section) Acute Rehab PT Goals PT Goal Formulation: With patient Progress towards PT goals: Progressing toward goals    Frequency    Min 2X/week      PT Plan Current plan remains appropriate    Co-evaluation              AM-PAC PT "6 Clicks" Mobility   Outcome Measure  Help needed turning from your back to your side while in a flat bed without using bedrails?: Total Help needed moving from lying on your back to sitting on the side of a flat bed without using bedrails?: Total Help needed moving to and from a bed to a chair (including a wheelchair)?: Total Help needed standing up from a chair using your arms (e.g., wheelchair or bedside chair)?: Total Help needed to walk in hospital room?: Total Help needed climbing 3-5 steps with a railing? : Total 6 Click Score: 6    End of Session   Activity Tolerance: Patient tolerated treatment well;No increased pain Patient left: with call bell/phone within reach;in chair;with nursing/sitter in room;with family/visitor present Nurse Communication: Mobility status PT Visit Diagnosis: Difficulty in walking, not elsewhere classified (R26.2);Other abnormalities of gait and mobility (R26.89);Muscle weakness (generalized) (M62.81)     Time: 1440 (4514)-6047 [phone]; took a break while IV team came in halfway) PT Time Calculation (min) (ACUTE ONLY): 78 min  Charges:  $Therapeutic  Exercise: 53-67 mins                    4:25 PM, 08/09/21 Etta Grandchild, PT, DPT Physical Therapist - [facility] [place]  [phone] ([place])     Drevin Ortner C 08/09/2021, 4:18 PM

## 2021-08-09 NOTE — Progress Notes (Signed)
  Progress Note   Patient: Diane Patton XAJ:[id number] DOB: 1946-02-18 DOA: 07/30/2021     10 DOS: the patient was seen and examined on 08/09/2021     Assessment and Plan: * Septic shock ([place]) Present on admission with bacterial meningitis and streptococcal bacteremia  Pneumococcal bacteremia Continue IV Rocephin through 08/16/2021  Pneumococcal meningitis Continue Rocephin through 08/16/2021  Acute respiratory failure with hypoxia [facility]) Patient was tachypneic to the 30s to 40s with O2 sat down to 84% on room air. Patient now breathing comfortably on room air.   Acute metabolic encephalopathy Improved.  Secondary to sepsis.  Pseudogout We will start low-dose colchicine.  Chronic systolic heart failure (HCC) Ischemic cardiomyopathy Continue Lasix, BiDil, metoprolol Monitor for overload in view of sepsis fluid resuscitation Patient follows with cardiology, last seen in May.  Most recent EF 40% Daily weights with intake and output monitoring  Stage 3b chronic kidney disease (HCC) Creatinine 1.09.  With GFR 52.  Anemia in chronic kidney disease Last hemoglobin 7.3.  If hemoglobin stays the same or lower side may give a blood transfusion tomorrow.  Chronic obstructive pulmonary disease (HCC) Continue nebulizers and inhalers  Diabetes mellitus, type 2 (HCC) Sliding scale insulin coverage.  Will hold home glimepiride, metformin and Ozempic  Essential hypertension Continue blood pressure medications        Subjective: Admitted with meningitis and pneumococcal bacteremia.  Patient feeling little bit better today.  Still feels weak.  Physical Exam: Vitals:   08/09/21 0300 08/09/21 0437 08/09/21 0737 08/09/21 1135  BP:  (!) 143/63 (!) 147/63 (!) 154/63  Pulse:  95 96 91  Resp:  '18 16 15  '$ Temp:  99.4 F (37.4 C) 98.6 F (37 C) 97.7 F (36.5 C)  TempSrc:    Oral  SpO2:  100% 98% 100%  Weight: 81.4 kg     Height:       Physical Exam HENT:     Head: Normocephalic.      Mouth/Throat:     Pharynx: No oropharyngeal exudate.  Eyes:     General: Lids are normal.     Conjunctiva/sclera: Conjunctivae normal.  Cardiovascular:     Rate and Rhythm: Normal rate and regular rhythm.     Heart sounds: Normal heart sounds, S1 normal and S2 normal.  Pulmonary:     Breath sounds: Normal breath sounds. No decreased breath sounds, wheezing, rhonchi or rales.  Abdominal:     Palpations: Abdomen is soft.     Tenderness: There is no abdominal tenderness.  Musculoskeletal:     Right lower leg: No swelling.     Left lower leg: No swelling.  Skin:    General: Skin is warm.     Findings: No rash.  Neurological:     Mental Status: She is alert and oriented to person, place, and time.     Data Reviewed: Knee aspiration consistent with pseudogout Hemoglobin 7.3, ferritin 528, AST up at 69 ALT 93, creatinine 1.09  Family Communication: Updated husband on the phone  Disposition: Status is: Inpatient Remains inpatient appropriate because: Awaiting insurance authorization prior to rehab.  Waiting for patient to be afebrile for 48 hours before either midline or PICC line placement  Planned Discharge Destination: Rehab   Author: Loletha Grayer, MD 08/09/2021 4:20 PM  For on call review [email or web address].

## 2021-08-10 DIAGNOSIS — G001 Pneumococcal meningitis: Secondary | ICD-10-CM | POA: Diagnosis not present

## 2021-08-10 DIAGNOSIS — R7881 Bacteremia: Secondary | ICD-10-CM | POA: Diagnosis not present

## 2021-08-10 DIAGNOSIS — G9341 Metabolic encephalopathy: Secondary | ICD-10-CM | POA: Diagnosis not present

## 2021-08-10 DIAGNOSIS — A419 Sepsis, unspecified organism: Secondary | ICD-10-CM | POA: Diagnosis not present

## 2021-08-10 DIAGNOSIS — I428 Other cardiomyopathies: Secondary | ICD-10-CM

## 2021-08-10 LAB — PROTEIN ELECTROPHORESIS, SERUM
A/G Ratio: 0.8 (ref 0.7–1.7)
Albumin ELP: 2.5 g/dL — ABNORMAL LOW (ref 2.9–4.4)
Alpha-1-Globulin: 0.4 g/dL (ref 0.0–0.4)
Alpha-2-Globulin: 0.9 g/dL (ref 0.4–1.0)
Beta Globulin: 1.1 g/dL (ref 0.7–1.3)
Gamma Globulin: 0.6 g/dL (ref 0.4–1.8)
Globulin, Total: 3.2 g/dL (ref 2.2–3.9)
Total Protein ELP: 5.7 g/dL — ABNORMAL LOW (ref 6.0–8.5)

## 2021-08-10 LAB — CBC
HCT: 23.7 % — ABNORMAL LOW (ref 36.0–46.0)
HCT: 30.3 % — ABNORMAL LOW (ref 36.0–46.0)
Hemoglobin: 7.3 g/dL — ABNORMAL LOW (ref 12.0–15.0)
Hemoglobin: 9.5 g/dL — ABNORMAL LOW (ref 12.0–15.0)
MCH: 30.4 pg (ref 26.0–34.0)
MCH: 29.6 pg (ref 26.0–34.0)
MCHC: 30.8 g/dL (ref 30.0–36.0)
MCHC: 31.4 g/dL (ref 30.0–36.0)
MCV: 98.8 fL (ref 80.0–100.0)
MCV: 94.4 fL (ref 80.0–100.0)
Platelets: 330 10*3/uL (ref 150–400)
Platelets: 406 10*3/uL — ABNORMAL HIGH (ref 150–400)
RBC: 2.4 MIL/uL — ABNORMAL LOW (ref 3.87–5.11)
RBC: 3.21 MIL/uL — ABNORMAL LOW (ref 3.87–5.11)
RDW: 12.9 % (ref 11.5–15.5)
RDW: 15.5 % (ref 11.5–15.5)
WBC: 8.3 10*3/uL (ref 4.0–10.5)
WBC: 10.7 10*3/uL — ABNORMAL HIGH (ref 4.0–10.5)
nRBC: 0 % (ref 0.0–0.2)
nRBC: 0 % (ref 0.0–0.2)

## 2021-08-10 LAB — GLUCOSE, CAPILLARY
Glucose-Capillary: 136 mg/dL — ABNORMAL HIGH (ref 70–99)
Glucose-Capillary: 117 mg/dL — ABNORMAL HIGH (ref 70–99)
Glucose-Capillary: 119 mg/dL — ABNORMAL HIGH (ref 70–99)
Glucose-Capillary: 160 mg/dL — ABNORMAL HIGH (ref 70–99)

## 2021-08-10 LAB — PREPARE RBC (CROSSMATCH)

## 2021-08-10 LAB — PHOSPHORUS: Phosphorus: 3.8 mg/dL (ref 2.5–4.6)

## 2021-08-10 LAB — ABO/RH: ABO/RH(D): B POS

## 2021-08-10 LAB — MAGNESIUM: Magnesium: 1.9 mg/dL (ref 1.7–2.4)

## 2021-08-10 MED ORDER — COLCHICINE 0.3 MG HALF TABLET
0.3000 mg | ORAL_TABLET | Freq: Every day | ORAL | Status: DC
  Filled 2021-08-10: qty 1

## 2021-08-10 MED ORDER — POLYETHYLENE GLYCOL 3350 17 G PO PACK
17.0000 g | PACK | Freq: Once | ORAL | Status: DC
Start: 2021-08-10 — End: 2021-08-10

## 2021-08-10 MED ORDER — LEVOFLOXACIN 500 MG PO TABS: 500.0000 mg | ORAL_TABLET | Freq: Every day | ORAL | Status: DC

## 2021-08-10 MED ORDER — POLYETHYLENE GLYCOL 3350 17 G PO PACK: 17.0000 g | PACK | Freq: Every day | ORAL | Status: DC | PRN

## 2021-08-10 MED ORDER — SODIUM CHLORIDE 0.9% FLUSH
10.0000 mL | Freq: Two times a day (BID) | INTRAVENOUS | Status: DC
  Administered 2021-08-10 – 2021-08-11 (×2): 10 mL

## 2021-08-10 MED ORDER — LACTULOSE 10 GM/15ML PO SOLN: 30.0000 g | Freq: Every day | ORAL | Status: DC | PRN

## 2021-08-10 MED ORDER — SODIUM CHLORIDE 0.9% FLUSH: 10.0000 mL | INTRAVENOUS | Status: DC | PRN

## 2021-08-10 MED ORDER — ACETAMINOPHEN 325 MG PO TABS
650.0000 mg | ORAL_TABLET | Freq: Once | ORAL | Status: AC
  Administered 2021-08-10: 650 mg via ORAL
  Filled 2021-08-10: qty 2

## 2021-08-10 MED ORDER — POLYETHYLENE GLYCOL 3350 17 G PO PACK: 17.0000 g | PACK | Freq: Every day | ORAL | Status: DC

## 2021-08-10 MED ORDER — METOPROLOL SUCCINATE ER 25 MG PO TB24
25.0000 mg | ORAL_TABLET | Freq: Every day | ORAL | Status: DC
  Administered 2021-08-10 – 2021-08-11 (×2): 25 mg via ORAL
  Filled 2021-08-10 (×2): qty 1

## 2021-08-10 MED ORDER — FUROSEMIDE 10 MG/ML IJ SOLN
40.0000 mg | Freq: Once | INTRAMUSCULAR | Status: AC
  Administered 2021-08-10: 40 mg via INTRAVENOUS
  Filled 2021-08-10: qty 4

## 2021-08-10 MED ORDER — SODIUM CHLORIDE 0.9% IV SOLUTION
Freq: Once | INTRAVENOUS | Status: AC
Start: 2021-08-10 — End: 2021-08-10

## 2021-08-10 MED ORDER — SACUBITRIL-VALSARTAN 24-26 MG PO TABS
1.0000 | ORAL_TABLET | Freq: Two times a day (BID) | ORAL | Status: DC
Start: 2021-08-10 — End: 2021-08-11
  Administered 2021-08-10 – 2021-08-11 (×2): 1 via ORAL
  Filled 2021-08-10 (×2): qty 1

## 2021-08-10 NOTE — Progress Notes (Signed)
  Progress Note   Patient: Diane Patton ZJQ:[id number] DOB: Dec 02, 1945 DOA: 07/30/2021     11 DOS: the patient was seen and examined on 08/10/2021     Assessment and Plan: * Septic shock ([place]) Present on admission with bacterial meningitis and streptococcal bacteremia  Pneumococcal bacteremia Continue IV Rocephin through 08/16/2021 then will be on p.o. Levaquin after that  Pneumococcal meningitis Continue Rocephin through 08/16/2021 and will be on p.o. Levaquin after that.  Acute respiratory failure with hypoxia ([place]) Patient was tachypneic to the 30s to 40s with O2 sat down to 84% on room air. Patient now breathing comfortably on room air.   Acute metabolic encephalopathy Improved.  Secondary to sepsis.  Pseudogout We will start low-dose colchicine.  Chronic systolic heart failure (HCC) Restart Toprol.  Dose of IV Lasix today with blood transfusion.  We will restart low-dose Entresto.   Stage 3b chronic kidney disease (HCC) Last creatinine 1.09.  With GFR 52.  Anemia in chronic kidney disease Last hemoglobin 7.3.  Transfuse 1 unit of packed red blood cells today and hemoglobin came up from 7.3 to 9.5  Chronic obstructive pulmonary disease (HCC) Continue nebulizers and inhalers  Diabetes mellitus, type 2 (HCC) Sliding scale insulin coverage.  Will hold home glimepiride, metformin and Ozempic  Essential hypertension Restart Toprol and Entresto.        Subjective: Patient still feeling weak.  Hemoglobin still low at 7.3.  Patient okay with transfusion of blood today.  The pain is a little bit better.  Physical Exam: Vitals:   08/10/21 1047 08/10/21 1051 08/10/21 1100 08/10/21 1304  BP: (!) 136/56 (!) 136/56 (!) 144/58 (!) 158/70  Pulse: 88 88 92 94  Resp: '18  18 18  '$ Temp: 98.6 F (37 C) 98.6 F (37 C) 98.5 F (36.9 C) 98.5 F (36.9 C)  TempSrc: Oral  Oral Oral  SpO2: 100% 100% 94% 97%  Weight:      Height:       Physical Exam HENT:     Head:  Normocephalic.     Mouth/Throat:     Pharynx: No oropharyngeal exudate.  Eyes:     General: Lids are normal.     Conjunctiva/sclera: Conjunctivae normal.  Cardiovascular:     Rate and Rhythm: Normal rate and regular rhythm.     Heart sounds: Normal heart sounds, S1 normal and S2 normal.  Pulmonary:     Breath sounds: Normal breath sounds. No decreased breath sounds, wheezing, rhonchi or rales.  Abdominal:     Palpations: Abdomen is soft.     Tenderness: There is no abdominal tenderness.  Musculoskeletal:     Right lower leg: No swelling.     Left lower leg: No swelling.  Skin:    General: Skin is warm.     Findings: No rash.  Neurological:     Mental Status: She is alert and oriented to person, place, and time.     Data Reviewed: Hemoglobin 7.3 this morning up to 9.5 after transfusion.  Family Communication: Tried to reach patient's husband on the phone  Disposition: Status is: Inpatient Remains inpatient appropriate because: Blood transfusion today.  Midline placement ordered Planned Discharge Destination: Rehab on 08/11/2021.  Patient must have a midline placed before discharge.   Author: Loletha Grayer, MD 08/10/2021 3:38 PM  For on call review [email or web address].

## 2021-08-10 NOTE — Progress Notes (Addendum)
OT Cancellation Note  Patient Details Name: Diane Patton MRN: [id number] DOB: 1945/08/05   Cancelled Treatment:    Reason Eval/Treat Not Completed: Other (comment). Consult received, chart reviewed. Pt currently receiving blood transfusion. Will hold OT evaluation and re-attempt at later date/time once transfusion is completed and pt medically appropriate.   Update 11:15am: Per RN, blood transfusion still running. Will re-attempt OT evaluation next date.  Ardeth Perfect., MPH, MS, OTR/L ascom [phone] 08/10/21, 8:37 AM

## 2021-08-10 NOTE — Treatment Plan (Signed)
Diagnosis: Strep pneumo bacteremia and purulent meningitis Baseline Creatinine 1.09    Allergies  Allergen Reactions   Amoxicillin Other (See Comments)    GI distress   Aspirin Diarrhea and Nausea And Vomiting   Celebrex [Celecoxib] Nausea Only   Latex Itching and Swelling   Penicillins Other (See Comments)    GI distress   Salicylates    Allegra [Fexofenadine] Cough    OPAT Orders Discharge antibiotics: Ceftriaxone 2 grams IV every 12 hours until 08/16/21 Duration: Total 2 weeks followed by PO antibiotic for 2 weeks   PIC/midline Care Per Protocol:  Labs weekly while on IV antibiotics: _X_ CBC with differential  _X_ CMP   _X_ Please pull PIC at completion of IV antibiotics  _Fax weekly labs to [phone]  Clinic Follow Up Appt: 7/11 at 10.45am   Call [phone] with any questions

## 2021-08-10 NOTE — Progress Notes (Signed)

## 2021-08-10 NOTE — Progress Notes (Signed)
PHARMACY CONSULT NOTE FOR:  OUTPATIENT  PARENTERAL ANTIBIOTIC THERAPY (OPAT)  Indication: S. Pneumoniae bacteremia and meningitis Regimen: Ceftriaxone 2gm IV q12h End date: 08/16/2021  Note - patient to start levofloxacin '500mg'$  PO daily on 6/29 until 7/12  Please pull PIC/midline at completion of IV antibiotics Fax weekly labs to [phone]  IV antibiotic discharge orders are pended. To discharging provider:  please sign these orders via discharge navigator,  Select New Orders & click on the button choice - Manage This Unsigned Work.     Thank you for allowing pharmacy to be a part of this patient's care.  Doreene Eland, PharmD, BCPS, BCIDP Work Cell: [phone] 08/10/2021 12:06 PM

## 2021-08-11 DIAGNOSIS — R7881 Bacteremia: Secondary | ICD-10-CM | POA: Diagnosis not present

## 2021-08-11 DIAGNOSIS — H7093 Unspecified mastoiditis, bilateral: Secondary | ICD-10-CM

## 2021-08-11 DIAGNOSIS — I10 Essential (primary) hypertension: Secondary | ICD-10-CM

## 2021-08-11 DIAGNOSIS — G001 Pneumococcal meningitis: Secondary | ICD-10-CM | POA: Diagnosis not present

## 2021-08-11 DIAGNOSIS — G9341 Metabolic encephalopathy: Secondary | ICD-10-CM | POA: Diagnosis not present

## 2021-08-11 DIAGNOSIS — A419 Sepsis, unspecified organism: Secondary | ICD-10-CM | POA: Diagnosis not present

## 2021-08-11 LAB — TYPE AND SCREEN
ABO/RH(D): B POS
Antibody Screen: NEGATIVE
Unit division: 0

## 2021-08-11 LAB — CBC
HCT: 31.6 % — ABNORMAL LOW (ref 36.0–46.0)
Hemoglobin: 10 g/dL — ABNORMAL LOW (ref 12.0–15.0)
MCH: 29.7 pg (ref 26.0–34.0)
MCHC: 31.6 g/dL (ref 30.0–36.0)
MCV: 93.8 fL (ref 80.0–100.0)
Platelets: 413 10*3/uL — ABNORMAL HIGH (ref 150–400)
RBC: 3.37 MIL/uL — ABNORMAL LOW (ref 3.87–5.11)
RDW: 15.5 % (ref 11.5–15.5)
WBC: 9 10*3/uL (ref 4.0–10.5)
nRBC: 0 % (ref 0.0–0.2)

## 2021-08-11 LAB — BPAM RBC
Blood Product Expiration Date: 202307032359
ISSUE DATE / TIME: 202306221027
Unit Type and Rh: 7300

## 2021-08-11 LAB — BASIC METABOLIC PANEL
Anion gap: 6 (ref 5–15)
BUN: 23 mg/dL (ref 8–23)
CO2: 30 mmol/L (ref 22–32)
Calcium: 10.3 mg/dL (ref 8.9–10.3)
Chloride: 104 mmol/L (ref 98–111)
Creatinine, Ser: 1.13 mg/dL — ABNORMAL HIGH (ref 0.44–1.00)
GFR, Estimated: 50 mL/min — ABNORMAL LOW (ref >60)
Glucose, Bld: 131 mg/dL — ABNORMAL HIGH (ref 70–99)
Potassium: 4.1 mmol/L (ref 3.5–5.1)
Sodium: 140 mmol/L (ref 135–145)

## 2021-08-11 LAB — PHOSPHORUS: Phosphorus: 4.4 mg/dL (ref 2.5–4.6)

## 2021-08-11 LAB — GLUCOSE, CAPILLARY: Glucose-Capillary: 132 mg/dL — ABNORMAL HIGH (ref 70–99)

## 2021-08-11 LAB — MAGNESIUM: Magnesium: 2.1 mg/dL (ref 1.7–2.4)

## 2021-08-11 MED ORDER — POLYETHYLENE GLYCOL 3350 17 G PO PACK
17.0000 g | PACK | Freq: Every day | ORAL | 0 refills | Status: AC | PRN
Start: 2021-08-11 — End: ?

## 2021-08-11 MED ORDER — ENSURE ENLIVE PO LIQD: 237.0000 mL | Freq: Three times a day (TID) | ORAL | 0 refills | Status: AC

## 2021-08-11 MED ORDER — CEFTRIAXONE IV (FOR PTA / DISCHARGE USE ONLY): 2.0000 g | Freq: Two times a day (BID) | INTRAVENOUS | 0 refills | Status: AC

## 2021-08-11 MED ORDER — SODIUM CHLORIDE 0.9% FLUSH: 10.0000 mL | Freq: Two times a day (BID) | INTRAVENOUS | Status: DC

## 2021-08-11 MED ORDER — FUROSEMIDE 40 MG PO TABS: 20.0000 mg | ORAL_TABLET | Freq: Every day | ORAL | 0 refills | Status: DC

## 2021-08-11 MED ORDER — LEVOFLOXACIN 500 MG PO TABS: 500.0000 mg | ORAL_TABLET | Freq: Every day | ORAL | 0 refills | Status: DC

## 2021-08-11 MED ORDER — OXYCODONE-ACETAMINOPHEN 5-325 MG PO TABS
1.0000 | ORAL_TABLET | Freq: Four times a day (QID) | ORAL | 0 refills | Status: DC | PRN
Start: 2021-08-11 — End: 2021-09-20

## 2021-08-11 MED ORDER — METOPROLOL SUCCINATE ER 25 MG PO TB24: 25.0000 mg | ORAL_TABLET | Freq: Every day | ORAL | 0 refills | Status: AC

## 2021-08-11 MED ORDER — SACUBITRIL-VALSARTAN 24-26 MG PO TABS: 1.0000 | ORAL_TABLET | Freq: Two times a day (BID) | ORAL | 0 refills | Status: DC

## 2021-08-11 MED ORDER — COLCHICINE 0.6 MG PO TABS: 0.3000 mg | ORAL_TABLET | Freq: Every day | ORAL | 0 refills | Status: DC

## 2021-08-11 NOTE — TOC Transition Note (Addendum)
Transition of Care [facility]) - CM/SW Discharge Note   Patient Details  Name: Diane Patton MRN: [id number] Date of Birth: October 23, 1945  Transition of [facility] CM/SW Contact:  Gildardo Griffes, LCSW Phone Number: 08/11/2021, 9:09 AM   Clinical Narrative:     Patient will DC to: [facility] Anticipated DC date: 08/11/21 Family notified: spouse Ervin- vm is full, texted cell Transport by: Wendie Simmer  Per MD patient ready for DC to [facility]. RN, patient, patient's family, and facility notified of DC. Discharge Summary sent to facility. RN given number for report  [phone]. DC packet on chart. Ambulance transport requested for patient.  CSW signing off.  Angeline Slim, LCSW    Final next level of care: Skilled Nursing Facility Barriers to Discharge: No Barriers Identified   Patient Goals and CMS Choice Patient states their goals for this hospitalization and ongoing recovery are:: to go home CMS Medicare.gov Compare Post Acute Care list provided to:: Patient Choice offered to / list presented to : Patient  Discharge Placement              Patient [facility] bed at:  [facility]) Patient to be transferred to facility by: [facility] Name of family member notified: Ervin Patient and family notified of of transfer: 08/11/21  Discharge Plan and Services   Discharge Planning Services: CM Consult                                 Social Determinants of Health (SDOH) Interventions     Readmission Risk Interventions     No data to display

## 2021-08-12 LAB — BODY FLUID CULTURE W GRAM STAIN
Culture: NO GROWTH
Culture: NO GROWTH
Gram Stain: NONE SEEN

## 2021-08-14 ENCOUNTER — Telehealth: Payer: Self-pay

## 2021-08-21 LAB — CULTURE, FUNGUS WITHOUT SMEAR

## 2021-08-24 ENCOUNTER — Encounter: Payer: Self-pay | Admitting: Oncology

## 2021-08-29 ENCOUNTER — Inpatient Hospital Stay: Payer: Medicare Other | Admitting: Infectious Diseases

## 2021-08-31 ENCOUNTER — Ambulatory Visit: Payer: Medicare Other | Admitting: Family

## 2021-08-31 NOTE — Progress Notes (Deleted)
Patient ID: Diane Patton, female    DOB: 10/30/1945, 76 y.o.   MRN: [id number]  HPI  Ms Trzcinski is a 76 y/o female with a history of osteoarthritis, obstructive sleep apnea, hyperlipidemia, HTN, GERD, DM, COPD, pulmonary HTN, remote tobacco use and chronic heart failure.  Echo report from 07/18/21 reviewed and showed an EF of 35%. Echo done on 08/17/15 and showed an EF of 40% with mild MR, AR and TR. Mild/mod pulmonary HTN. This is a slight improvement from 12/06/13 when her EF was 35-40%. Had cardiac catheterization done 05/12/14.  Admitted 07/30/21 due to   She presents today for a follow-up visit although hasn't been seen since May 2019. She presents with a chief complaint of  Past Medical History:  Diagnosis Date   Asthma    Cardiomyopathy, nonischemic (HCC)    CHF (congestive heart failure) (HCC)    COPD (chronic obstructive pulmonary disease) (HCC)    Diabetes mellitus, type 2 (HCC)    Gastric ulcer without hemorrhage or perforation    GERD (gastroesophageal reflux disease)    HTN (hypertension)    Hyperlipidemia    IDA (iron deficiency anemia) 04/05/2020   Obstructive sleep apnea on CPAP    Osteoarthritis    osteo of both knees   Scoliosis    Past Surgical History:  Procedure Laterality Date   ABDOMINAL HYSTERECTOMY     ankle (other)     APPENDECTOMY     BREAST BIOPSY Right 1999   neg   BREAST CYST ASPIRATION     neg   BREAST SURGERY     CARDIAC CATHETERIZATION     CLAVICLE SURGERY Right    Fx   COLONOSCOPY WITH PROPOFOL N/A 07/10/2016   Procedure: COLONOSCOPY WITH PROPOFOL;  Surgeon: Lollie Sails, MD;  Location: [facility] ENDOSCOPY;  Service: Endoscopy;  Laterality: N/A;   ear (otheR)     ESOPHAGOGASTRODUODENOSCOPY N/A 05/11/2019   Procedure: ESOPHAGOGASTRODUODENOSCOPY (EGD);  Surgeon: Toledo, Benay Pike, MD;  Location: [facility] ENDOSCOPY;  Service: Gastroenterology;  Laterality: N/A;   ESOPHAGOGASTRODUODENOSCOPY (EGD) WITH PROPOFOL N/A 07/10/2016   Procedure:  ESOPHAGOGASTRODUODENOSCOPY (EGD) WITH PROPOFOL;  Surgeon: Lollie Sails, MD;  Location: [facility] ENDOSCOPY;  Service: Endoscopy;  Laterality: N/A;   ESOPHAGOGASTRODUODENOSCOPY (EGD) WITH PROPOFOL N/A 09/17/2017   Procedure: ESOPHAGOGASTRODUODENOSCOPY (EGD) WITH PROPOFOL;  Surgeon: Lollie Sails, MD;  Location: [facility] ENDOSCOPY;  Service: Endoscopy;  Laterality: N/A;   feet (other)     HERNIA REPAIR     sinus (other)     stomach     UPPER ESOPHAGEAL ENDOSCOPIC ULTRASOUND (EUS) N/A 11/22/2016   Procedure: UPPER ESOPHAGEAL ENDOSCOPIC ULTRASOUND (EUS);  Surgeon: Reita Cliche, MD;  Location[facility] ENDOSCOPY;  Service: Gastroenterology;  Laterality: N/A;   Family History  Problem Relation Age of Onset   Alzheimer's disease Other    Heart attack Mother    Hypertension Mother    Cancer Brother        lung   Cancer Maternal Aunt        mouth and breast   Breast cancer Maternal Aunt    Cancer Daughter        throat   Dementia Father    Social History   Tobacco Use   Smoking status: Former    Packs/day: 1.00    Years: 15.00    Total pack years: 15.00    Types: Cigarettes    Quit date: 07/21/1982    Years since quitting: 39.1   Smokeless tobacco: Never  Substance  Use Topics   Alcohol use: No    Alcohol/week: 15.0 standard drinks of alcohol    Types: 15 Standard drinks or equivalent per week    Comment: quit drinking 04/1981   Allergies  Allergen Reactions   Amoxicillin Other (See Comments)    GI distress   Aspirin Diarrhea and Nausea And Vomiting   Celebrex [Celecoxib] Nausea Only   Latex Itching and Swelling   Penicillins Other (See Comments)    GI distress   Salicylates    Allegra [Fexofenadine] Cough   Prior to Admission medications   Medication Sig Start Date End Date Taking? Authorizing Provider  albuterol (PROVENTIL HFA;VENTOLIN HFA) 108 (90 BASE) MCG/ACT inhaler Inhale 2 puffs into the lungs every 6 (six) hours as needed for wheezing or shortness of breath.   Yes  [provider]  aspirin 81 MG tablet Take 81 mg by mouth daily.   Yes [provider]  BIDIL 20-37.5 MG tablet TAKE 1 TABLET BY MOUTH THREE TIMES DAILY 05/20/17  Yes Darylene Price A, FNP  Biotin 1000 MCG tablet Take 1,000 mcg by mouth daily.    Yes [provider]  calcium carbonate (CALCIUM 600) 600 MG TABS tablet Take 600 mg by mouth 2 (two) times daily with a meal.    Yes [provider]  cholecalciferol (VITAMIN D) 400 units TABS tablet Take 2,000 Units by mouth daily.    Yes [provider]  [facility] Liver Oil w/Vit A & D CAPS Take 1 capsule by mouth daily.   Yes [provider]  ENTRESTO 97-103 MG TAKE ONE TABLET BY MOUTH TWICE DAILY 09/20/16  Yes Darylene Price A, FNP  ferrous gluconate (FERGON) 324 MG tablet Take 324 mg by mouth daily with breakfast.   Yes [provider]  furosemide (LASIX) 40 MG tablet Take 40 mg by mouth daily.   Yes [provider]  glimepiride (AMARYL) 2 MG tablet Take 2 mg by mouth daily with breakfast.   Yes [provider]  levocetirizine (XYZAL) 5 MG tablet Take 5 mg by mouth every evening.   Yes [provider]  meloxicam (MOBIC) 7.5 MG tablet Take 7.5 mg by mouth 2 (two) times daily as needed for pain. Takes one every other day   Yes [provider]  metFORMIN (GLUCOPHAGE) 500 MG tablet Take 1,000 mg by mouth 2 (two) times daily with a meal.   Yes [provider]  metoprolol (TOPROL-XL) 200 MG 24 hr tablet TAKE 1 TABLET BY MOUTH ONCE DAILY. TAKE WITH OR IMMEDIATELY FOLLOWING A MEAL. 05/20/17  Yes Magdelyn Roebuck A, FNP  montelukast (SINGULAIR) 10 MG tablet Take 10 mg by mouth at bedtime.   Yes [provider]  Multiple Vitamin (MULTIVITAMIN) tablet Take 1 tablet by mouth daily.    Yes [provider]  Omega 3-6-9 Fatty Acids (TRIPLE OMEGA-3-6-9) CAPS Take 1 capsule by mouth daily.   Yes [provider]  omeprazole (PRILOSEC) 20 MG capsule Take  20 mg by mouth 2 (two) times daily before a meal.   Yes [provider]  potassium chloride (K-DUR) 10 MEQ tablet TAKE ONE TABLET BY MOUTH ONCE DAILY 08/19/16  Yes Darylene Price A, FNP  rosuvastatin (CRESTOR) 20 MG tablet Take 20 mg by mouth daily.   Yes [provider]  Tiotropium Bromide Monohydrate (SPIRIVA RESPIMAT) 1.25 MCG/ACT AERS Inhale 2 puffs into the lungs daily.   Yes [provider]  traMADol-acetaminophen (ULTRACET) 37.5-325 MG tablet Take 1 tablet by  mouth every 6 (six) hours as needed. Takes twice a day   Yes [provider]  traZODone (DESYREL) 100 MG tablet Take 100 mg by mouth at bedtime.   Yes [provider]  vitamin B-12 (CYANOCOBALAMIN) 500 MCG tablet Take 500 mcg daily by mouth.   Yes [provider]  vitamin C (ASCORBIC ACID) 500 MG tablet Take 500 mg by mouth daily.    Yes [provider]  vitamin E 400 UNIT capsule Take 400 Units by mouth daily.   Yes [provider]  cetirizine (ZYRTEC) 10 MG tablet Take 10 mg by mouth daily.    [provider]  ondansetron (ZOFRAN-ODT) 4 MG disintegrating tablet Take 4 mg every 8 (eight) hours as needed by mouth for nausea or vomiting.    [provider]  pantoprazole (PROTONIX) 40 MG tablet Take 40 mg by mouth 2 (two) times daily.    [provider]    Review of Systems  Constitutional:  Negative for appetite change and fatigue.  HENT:  Positive for congestion, hearing loss and rhinorrhea. Negative for sore throat.   Eyes: Negative.   Respiratory:  Negative for cough, chest tightness, shortness of breath and wheezing.   Cardiovascular:  Positive for chest pain. Negative for palpitations and leg swelling.  Gastrointestinal:  Negative for abdominal distention and abdominal pain.  Endocrine: Negative.   Genitourinary: Negative.   Musculoskeletal:  Negative for back pain and neck pain.  Skin: Negative.   Allergic/Immunologic: Negative.    Neurological:  Negative for dizziness and light-headedness.  Hematological:  Negative for adenopathy. Does not bruise/bleed easily.  Psychiatric/Behavioral:  Negative for dysphoric mood, sleep disturbance (sleeping on 2 pillows) and suicidal ideas. The patient is not nervous/anxious.    There were no vitals filed for this visit.  Wt Readings from Last 3 Encounters:  08/11/21 170 lb 10.2 oz (77.4 kg)  04/06/21 168 lb (76.2 kg)  11/29/20 160 lb 4.8 oz (72.7 kg)   Lab Results  Component Value Date   CREATININE 1.13 (H) 08/11/2021   CREATININE 1.09 (H) 08/09/2021   CREATININE 1.07 (H) 08/08/2021    Physical Exam Vitals and nursing note reviewed.  Constitutional:      Appearance: She is well-developed.  HENT:     Head: Normocephalic and atraumatic.  Neck:     Vascular: No JVD.  Cardiovascular:     Rate and Rhythm: Normal rate and regular rhythm.  Pulmonary:     Effort: Pulmonary effort is normal.     Breath sounds: No wheezing or rales.  Abdominal:     General: There is no distension.     Palpations: Abdomen is soft.     Tenderness: There is no abdominal tenderness.  Musculoskeletal:        General: No tenderness.     Cervical back: Normal range of motion and neck supple.  Skin:    General: Skin is warm and dry.  Neurological:     Mental Status: She is alert and oriented to person, place, and time.  Psychiatric:        Behavior: Behavior normal.        Thought Content: Thought content normal.    Assessment & Plan:  1: Chronic heart failure with reduced ejection fraction- - NYHA class I - euvolemic - only using salt on baked potato; encouraged only 2000 mg per day of salt - continues to weigh daily. Reminded to call for an overnight weight gain of >2 pounds or a  weekly weight gain of >5 pounds - weight stable since the last time she was here - drinking around 30 ounces a day. Counseled on drinking between 45-50 ounces in a day - on max entrestro and toprol. On  bidil as well - saw her cardiologist ([place]) on 01/17/17 & returns 07/25/17 - PharmD reconciled medications with patient  2: HTN- - BP looks good today - saw her PCP Candiss Norse) on 01/22/17 - BMP from 01/15/17 reviewed and shows sodium 141, potassium 4.2 and GFR 56  3: Diabetes- - fasting glucose at home was 99 - follows with PCP about her diabetes - A1c on 09/19/16 was 6.3%  Medication bottles were reviewed.  Patient is to return as needed. She was instructed to call at any time in the future to make another appointment.

## 2021-09-18 ENCOUNTER — Ambulatory Visit: Payer: Medicare Other | Admitting: Family

## 2021-09-20 ENCOUNTER — Ambulatory Visit: Payer: Medicare Other | Attending: Family | Admitting: Family

## 2021-09-20 ENCOUNTER — Encounter: Payer: Self-pay | Admitting: Family

## 2021-09-20 VITALS — BP 131/79 | HR 73 | Resp 14 | Ht 62.0 in | Wt 168.2 lb

## 2021-09-20 DIAGNOSIS — Z7984 Long term (current) use of oral hypoglycemic drugs: Secondary | ICD-10-CM | POA: Diagnosis not present

## 2021-09-20 DIAGNOSIS — I10 Essential (primary) hypertension: Secondary | ICD-10-CM

## 2021-09-20 DIAGNOSIS — D631 Anemia in chronic kidney disease: Secondary | ICD-10-CM

## 2021-09-20 DIAGNOSIS — D649 Anemia, unspecified: Secondary | ICD-10-CM | POA: Insufficient documentation

## 2021-09-20 DIAGNOSIS — Z8249 Family history of ischemic heart disease and other diseases of the circulatory system: Secondary | ICD-10-CM | POA: Diagnosis not present

## 2021-09-20 DIAGNOSIS — I272 Pulmonary hypertension, unspecified: Secondary | ICD-10-CM | POA: Insufficient documentation

## 2021-09-20 DIAGNOSIS — E119 Type 2 diabetes mellitus without complications: Secondary | ICD-10-CM | POA: Diagnosis not present

## 2021-09-20 DIAGNOSIS — R42 Dizziness and giddiness: Secondary | ICD-10-CM | POA: Insufficient documentation

## 2021-09-20 DIAGNOSIS — E785 Hyperlipidemia, unspecified: Secondary | ICD-10-CM | POA: Insufficient documentation

## 2021-09-20 DIAGNOSIS — M25562 Pain in left knee: Secondary | ICD-10-CM | POA: Insufficient documentation

## 2021-09-20 DIAGNOSIS — H9201 Otalgia, right ear: Secondary | ICD-10-CM | POA: Insufficient documentation

## 2021-09-20 DIAGNOSIS — K219 Gastro-esophageal reflux disease without esophagitis: Secondary | ICD-10-CM | POA: Diagnosis not present

## 2021-09-20 DIAGNOSIS — I110 Hypertensive heart disease with heart failure: Secondary | ICD-10-CM | POA: Diagnosis not present

## 2021-09-20 DIAGNOSIS — M25561 Pain in right knee: Secondary | ICD-10-CM | POA: Diagnosis not present

## 2021-09-20 DIAGNOSIS — I5022 Chronic systolic (congestive) heart failure: Secondary | ICD-10-CM | POA: Diagnosis present

## 2021-09-20 DIAGNOSIS — Z87891 Personal history of nicotine dependence: Secondary | ICD-10-CM | POA: Insufficient documentation

## 2021-09-20 DIAGNOSIS — G4733 Obstructive sleep apnea (adult) (pediatric): Secondary | ICD-10-CM | POA: Diagnosis not present

## 2021-09-20 DIAGNOSIS — N181 Chronic kidney disease, stage 1: Secondary | ICD-10-CM

## 2021-09-20 DIAGNOSIS — J449 Chronic obstructive pulmonary disease, unspecified: Secondary | ICD-10-CM | POA: Diagnosis not present

## 2021-09-20 NOTE — Patient Instructions (Addendum)
Resume weighing daily and call for an overnight weight gain of 3 pounds or more or a weekly weight gain of more than 5 pounds.   If you have voicemail, please make sure your mailbox is cleaned out so that we may leave a message and please make sure to listen to any voicemails.    Bring ALL medications including any over the counter or vitamins to every visit.    Take our medication list today and compare it to your medication bottles and let us know of anything different

## 2021-09-20 NOTE — Progress Notes (Signed)
Patient ID: Diane Patton, female    DOB: [birth year], 76 y.o.   MRN: [id number]  HPI  Diane Patton is a 76 y/o female with a history of osteoarthritis, obstructive sleep apnea, hyperlipidemia, HTN, GERD, DM, COPD, pulmonary HTN, remote tobacco use and chronic heart failure.  Echo report from 07/18/21 reviewed and showed an EF of 35%. Echo done on 08/17/15 and showed an EF of 40% with mild MR, AR and TR. Mild/mod pulmonary HTN. This is a slight improvement from 12/06/13 when her EF was 35-40%. Had cardiac catheterization done 05/12/14.  Admitted 07/30/21 due to AMS. 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 Started on antibiotics IV with transition to oral meds. MRIs did show temporal lobe hemorrhage. Her aspirin was held. Repeat MRI did not show this has improved. ID, neurology, orthopedics and surgical consults obtained. Given 1 unit PRBC's. PT/OT evaluations done. Discharged after 12 days.   She presents today for a follow-up visit although hasn't been seen since May 2019. She presents with a chief complaint of moderate fatigue with minimal exertion. Describes this as chronic in nature although she feels like it's slowly improving. She has associated right ear pain, light-headedness and bilateral knee pain. She denies any difficulty sleeping, abdominal distention, palpitations, pedal edema, chest pain, shortness of breath, cough or chest tightness.   Not weighing daily because she says her scales don't work even after replacing the batteries.   Brought a medication list in for review but it's from Feb and she's been admitted since that time.   Past Medical History:  Diagnosis Date   Asthma    Cardiomyopathy, nonischemic (HCC)    CHF (congestive heart failure) (HCC)    COPD (chronic obstructive pulmonary disease) (HCC)    Diabetes mellitus, type 2 (HCC)    Gastric ulcer without hemorrhage or perforation    GERD (gastroesophageal reflux disease)    HTN  (hypertension)    Hyperlipidemia    IDA (iron deficiency anemia) 04/05/2020   Obstructive sleep apnea on CPAP    Osteoarthritis    osteo of both knees   Scoliosis    Past Surgical History:  Procedure Laterality Date   ABDOMINAL HYSTERECTOMY     ankle (other)     APPENDECTOMY     BREAST BIOPSY Right 1999   neg   BREAST CYST ASPIRATION     neg   BREAST SURGERY     CARDIAC CATHETERIZATION     CLAVICLE SURGERY Right    Fx   COLONOSCOPY WITH PROPOFOL N/A 07/10/2016   Procedure: COLONOSCOPY WITH PROPOFOL;  Surgeon: Lollie Sails, MD;  Location: [facility] ENDOSCOPY;  Service: Endoscopy;  Laterality: N/A;   ear (otheR)     ESOPHAGOGASTRODUODENOSCOPY N/A 05/11/2019   Procedure: ESOPHAGOGASTRODUODENOSCOPY (EGD);  Surgeon: Toledo, Benay Pike, MD;  Location: [facility] ENDOSCOPY;  Service: Gastroenterology;  Laterality: N/A;   ESOPHAGOGASTRODUODENOSCOPY (EGD) WITH PROPOFOL N/A 07/10/2016   Procedure: ESOPHAGOGASTRODUODENOSCOPY (EGD) WITH PROPOFOL;  Surgeon: Lollie Sails, MD;  Location: [facility] ENDOSCOPY;  Service: Endoscopy;  Laterality: N/A;   ESOPHAGOGASTRODUODENOSCOPY (EGD) WITH PROPOFOL N/A 09/17/2017   Procedure: ESOPHAGOGASTRODUODENOSCOPY (EGD) WITH PROPOFOL;  Surgeon: Lollie Sails, MD;  Location: [facility] ENDOSCOPY;  Service: Endoscopy;  Laterality: N/A;   feet (other)     HERNIA REPAIR     sinus (other)     stomach     UPPER ESOPHAGEAL ENDOSCOPIC ULTRASOUND (EUS) N/A 11/22/2016   Procedure: UPPER ESOPHAGEAL ENDOSCOPIC ULTRASOUND (EUS);  Surgeon: Reita Cliche,  MD;  Location: [facility] ENDOSCOPY;  Service: Gastroenterology;  Laterality: N/A;   Family History  Problem Relation Age of Onset   Alzheimer's disease Other    Heart attack Mother    Hypertension Mother    Cancer Brother        lung   Cancer Maternal Aunt        mouth and breast   Breast cancer Maternal Aunt    Cancer Daughter        throat   Dementia Father    Social History   Tobacco Use   Smoking status: Former     Packs/day: 1.00    Years: 15.00    Total pack years: 15.00    Types: Cigarettes    Quit date: 07/21/1982    Years since quitting: 39.1   Smokeless tobacco: Never  Substance Use Topics   Alcohol use: No    Alcohol/week: 15.0 standard drinks of alcohol    Types: 15 Standard drinks or equivalent per week    Comment: quit drinking 04/1981   Allergies  Allergen Reactions   Amoxicillin Other (See Comments)    GI distress   Aspirin Diarrhea and Nausea And Vomiting   Celebrex [Celecoxib] Nausea Only   Latex Itching and Swelling   Penicillins Other (See Comments)    GI distress   Salicylates    Allegra [Fexofenadine] Cough   Prior to Admission medications   Medication Sig Start Date End Date Taking? Authorizing Provider  albuterol (PROVENTIL HFA;VENTOLIN HFA) 108 (90 BASE) MCG/ACT inhaler Inhale 2 puffs into the lungs every 6 (six) hours as needed for wheezing or shortness of breath.   Yes [provider]  ascorbic acid (VITAMIN C) 500 MG tablet Take 500 mg by mouth daily.   Yes [provider]  aspirin 81 MG chewable tablet Chew by mouth daily.   Yes [provider]  benzonatate (TESSALON) 200 MG capsule Take 1 capsule by mouth 3 (three) times daily as needed. 02/16/21  Yes [provider]  busPIRone (BUSPAR) 5 MG tablet Take 1 tablet by mouth 3 (three) times daily. 07/15/20  Yes [provider]  calcium carbonate (OS-CAL) 600 MG TABS tablet Take 600 mg by mouth 2 (two) times daily with a meal.    Yes [provider]  cetirizine (ZYRTEC) 10 MG tablet Take 10 mg by mouth daily as needed.   Yes [provider]  cholecalciferol (VITAMIN D) 400 units TABS tablet Take 1,000 Units by mouth daily.   Yes [provider]  [facility] Liver Oil w/Vit A & D CAPS Take 1 capsule by mouth daily.   Yes [provider]  feeding supplement (ENSURE ENLIVE / ENSURE PLUS) LIQD Take 237 mLs by mouth 3 (three) times daily between meals.  08/11/21  Yes Wieting, Richard, MD  ferrous gluconate (FERGON) 324 MG tablet Take 324 mg by mouth daily with breakfast.   Yes [provider]  Ferrous Gluconate 324 (37.5 Fe) MG TABS Take 1 tablet by mouth daily. 08/11/21  Yes [provider]  furosemide (LASIX) 40 MG tablet Take 0.5 tablets (20 mg total) by mouth daily. 08/11/21  Yes Wieting, Richard, MD  glimepiride (AMARYL) 2 MG tablet Take 2 mg by mouth daily with breakfast.   Yes [provider]  isosorbide mononitrate (IMDUR) 30 MG 24 hr tablet Take 30 mg by mouth daily. 04/06/21 04/06/22 Yes [provider]  levocetirizine (XYZAL) 5 MG tablet Take 5 mg by mouth every evening.  Yes [provider]  metoprolol succinate (TOPROL-XL) 25 MG 24 hr tablet Take 1 tablet (25 mg total) by mouth daily. 08/11/21  Yes Wieting, Richard, MD  Multiple Vitamin (MULTIVITAMIN) tablet Take 1 tablet by mouth daily.    Yes [provider]  pantoprazole (PROTONIX) 40 MG tablet Take 40 mg by mouth 2 (two) times daily.   Yes [provider]  polyethylene glycol (MIRALAX / GLYCOLAX) 17 g packet Take 17 g by mouth daily as needed for severe constipation. 08/11/21  Yes Wieting, Richard, MD  rosuvastatin (CRESTOR) 20 MG tablet Take 1 tablet by mouth daily. 05/18/21  Yes [provider]  sacubitril-valsartan (ENTRESTO) 24-26 MG Take 1 tablet by mouth 2 (two) times daily. 08/11/21  Yes Wieting, Richard, MD  sucralfate (CARAFATE) 1 g tablet Take 1 tablet by mouth 2 (two) times daily before a meal. 07/18/21  Yes [provider]  traMADol-acetaminophen (ULTRACET) 37.5-325 MG tablet Take 1 tablet by mouth every 8 (eight) hours as needed. 07/18/21  Yes [provider]  traZODone (DESYREL) 100 MG tablet Take 1 tablet by mouth at bedtime. 06/16/21  Yes [provider]   Review of Systems  Constitutional:  Positive for fatigue (tire easily). Negative for appetite change.  HENT:  Positive for  ear pain (right ear) and hearing loss. Negative for congestion, rhinorrhea and sore throat.   Eyes: Negative.   Respiratory:  Negative for cough, chest tightness, shortness of breath and wheezing.   Cardiovascular:  Negative for chest pain, palpitations and leg swelling.  Gastrointestinal:  Negative for abdominal distention and abdominal pain.  Endocrine: Negative.   Genitourinary: Negative.   Musculoskeletal:  Positive for arthralgias (bilateral knee pain). Negative for back pain and neck pain.  Skin: Negative.   Allergic/Immunologic: Negative.   Neurological:  Positive for light-headedness. Negative for dizziness.  Hematological:  Negative for adenopathy. Does not bruise/bleed easily.  Psychiatric/Behavioral:  Negative for dysphoric mood, sleep disturbance (sleeping on 3 pillows) and suicidal ideas. The patient is not nervous/anxious.    Vitals:   09/20/21 1024  BP: 131/79  Pulse: 73  Resp: 14  SpO2: 99%  Weight: 168 lb 4 oz (76.3 kg)  Height: '5\' 2"'$  (1.575 m)   Wt Readings from Last 3 Encounters:  09/20/21 168 lb 4 oz (76.3 kg)  08/11/21 170 lb 10.2 oz (77.4 kg)  04/06/21 168 lb (76.2 kg)   Lab Results  Component Value Date   CREATININE 1.13 (H) 08/11/2021   CREATININE 1.09 (H) 08/09/2021   CREATININE 1.07 (H) 08/08/2021   Physical Exam Vitals and nursing note reviewed.  Constitutional:      Appearance: She is well-developed.  HENT:     Head: Normocephalic and atraumatic.  Neck:     Vascular: No JVD.  Cardiovascular:     Rate and Rhythm: Normal rate and regular rhythm.  Pulmonary:     Effort: Pulmonary effort is normal.     Breath sounds: No wheezing or rales.  Abdominal:     General: There is no distension.     Palpations: Abdomen is soft.     Tenderness: There is no abdominal tenderness.  Musculoskeletal:        General: No tenderness.     Cervical back: Normal range of motion and neck supple.  Skin:    General: Skin is warm and dry.  Neurological:      Mental Status: She is alert and oriented to person, place, and time.  Psychiatric:  Behavior: Behavior normal.        Thought Content: Thought content normal.    Assessment & Plan:  1: Chronic heart failure with reduced ejection fraction- - NYHA class III - euvolemic - scales provided today and instructed to weigh every morning and call for an overnight weight gain of > 2 pounds or a weekly weight gain of > 5 pounds - not adding any salt to her food - drinking around 40-60 ounces of fluid daily - on GDMT of metoprolol & entresto - consider SGLT2 or MRA at next visit once she brings medications in to confirm them - did advise her to take today's med list and compare with her bottles at home and let me know of any errors - saw cardiology Margarito Courser) 06/28/21 - BNP 07/30/21 was 486.9  2: HTN- - BP mildly elevated (131/79) but she hasn't taken any of her meds yet today but will do so upon her return home - saw her PCP (Hande) 03/30/21 - BMP from 08/11/21 reviewed and shows sodium 140, potassium 4.1, creatinine 1.13 and GFR 50 - saw nephrology (Korrapati) 07/13/21  3: Diabetes- - A1c 07/30/21 was 6.7%  4: Anemia- - saw hematology Tasia Catchings) 04/06/21 - hemoglobin 09/20/21 was 11.1   Medication list was brought but it was from February and she's not completely sure of what she's taking. Emphasized the importance of bringing medication bottles to every visit.   Return in 2 months, sooner if needed.

## 2021-10-09 ENCOUNTER — Ambulatory Visit: Payer: Medicaid Other | Admitting: Diagnostic Neuroimaging

## 2021-10-09 ENCOUNTER — Encounter: Payer: Self-pay | Admitting: Diagnostic Neuroimaging

## 2021-10-13 ENCOUNTER — Ambulatory Visit: Payer: Medicare Other | Admitting: Family

## 2021-11-28 ENCOUNTER — Encounter: Payer: Self-pay | Admitting: Family

## 2021-11-28 ENCOUNTER — Ambulatory Visit: Payer: Medicare Other | Attending: Family | Admitting: Family

## 2021-11-28 VITALS — BP 150/59 | HR 88 | Resp 16 | Ht 63.0 in | Wt 174.0 lb

## 2021-11-28 DIAGNOSIS — R42 Dizziness and giddiness: Secondary | ICD-10-CM | POA: Insufficient documentation

## 2021-11-28 DIAGNOSIS — I10 Essential (primary) hypertension: Secondary | ICD-10-CM | POA: Diagnosis not present

## 2021-11-28 DIAGNOSIS — J4489 Other specified chronic obstructive pulmonary disease: Secondary | ICD-10-CM | POA: Insufficient documentation

## 2021-11-28 DIAGNOSIS — R0602 Shortness of breath: Secondary | ICD-10-CM | POA: Insufficient documentation

## 2021-11-28 DIAGNOSIS — D649 Anemia, unspecified: Secondary | ICD-10-CM | POA: Diagnosis not present

## 2021-11-28 DIAGNOSIS — K219 Gastro-esophageal reflux disease without esophagitis: Secondary | ICD-10-CM | POA: Diagnosis not present

## 2021-11-28 DIAGNOSIS — M1990 Unspecified osteoarthritis, unspecified site: Secondary | ICD-10-CM | POA: Insufficient documentation

## 2021-11-28 DIAGNOSIS — Z79899 Other long term (current) drug therapy: Secondary | ICD-10-CM | POA: Diagnosis not present

## 2021-11-28 DIAGNOSIS — E119 Type 2 diabetes mellitus without complications: Secondary | ICD-10-CM

## 2021-11-28 DIAGNOSIS — E785 Hyperlipidemia, unspecified: Secondary | ICD-10-CM | POA: Insufficient documentation

## 2021-11-28 DIAGNOSIS — I272 Pulmonary hypertension, unspecified: Secondary | ICD-10-CM | POA: Insufficient documentation

## 2021-11-28 DIAGNOSIS — M25569 Pain in unspecified knee: Secondary | ICD-10-CM | POA: Insufficient documentation

## 2021-11-28 DIAGNOSIS — Z87891 Personal history of nicotine dependence: Secondary | ICD-10-CM | POA: Insufficient documentation

## 2021-11-28 DIAGNOSIS — I110 Hypertensive heart disease with heart failure: Secondary | ICD-10-CM | POA: Diagnosis not present

## 2021-11-28 DIAGNOSIS — G4733 Obstructive sleep apnea (adult) (pediatric): Secondary | ICD-10-CM | POA: Insufficient documentation

## 2021-11-28 DIAGNOSIS — Z7901 Long term (current) use of anticoagulants: Secondary | ICD-10-CM | POA: Diagnosis not present

## 2021-11-28 DIAGNOSIS — I5022 Chronic systolic (congestive) heart failure: Secondary | ICD-10-CM

## 2021-11-28 DIAGNOSIS — N181 Chronic kidney disease, stage 1: Secondary | ICD-10-CM | POA: Diagnosis not present

## 2021-11-28 DIAGNOSIS — D631 Anemia in chronic kidney disease: Secondary | ICD-10-CM

## 2021-11-28 DIAGNOSIS — R5383 Other fatigue: Secondary | ICD-10-CM | POA: Diagnosis present

## 2021-11-28 DIAGNOSIS — Z7984 Long term (current) use of oral hypoglycemic drugs: Secondary | ICD-10-CM | POA: Diagnosis not present

## 2021-11-28 NOTE — Patient Instructions (Addendum)
Continue weighing daily and call for an overnight weight gain of 3 pounds or more or a weekly weight gain of more than 5 pounds.  If you have voicemail, please make sure your mailbox is cleaned out so that we may leave a message and please make sure to listen to any voicemails.     Make sure you break the fluid pill in half and take 1/2 tablet every day. Take another 1/2 tablet if you need it for above weight gain or swelling.

## 2021-11-28 NOTE — Progress Notes (Signed)
Patient ID: Diane Patton, female    DOB: [birth year], 76 y.o.   MRN: [id number]  HPI  [name] is a 76 y/o female with a history of osteoarthritis, obstructive sleep apnea, hyperlipidemia, HTN, GERD, DM, COPD, pulmonary HTN, remote tobacco use and chronic heart failure.  Echo report from 07/18/21 reviewed and showed an EF of 35%. Echo done on 08/17/15 and showed an EF of 40% with mild MR, AR and TR. Mild/mod pulmonary HTN. This is a slight improvement from 12/06/13 when her EF was 35-40%. Had cardiac catheterization done 05/12/14.  Admitted 07/30/21 due to AMS. 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 Started on antibiotics IV with transition to oral meds. MRIs did show temporal lobe hemorrhage. Her aspirin was held. Repeat MRI did not show this has improved. ID, neurology, orthopedics and surgical consults obtained. Given 1 unit PRBC's. PT/OT evaluations done. Discharged after 12 days.   She presents today for a follow-up visit with a chief complaint of moderate fatigue with minimal exertion. Describes this as chronic in nature. She has associated shortness of breath, light-headedness, weakness and knee pain along with this. She denies any difficulty sleeping, abdominal distention, palpitations, pedal edema, chest pain, wheezing, cough or weight gain.   Says that she continues to be very fatigued and weak ever since she had meningitis back in June. Does feel like she's slowly improving though. Uses a walker for stability.   Did not bring her medication bottles or a list and is unclear of exactly what she's taking. Does mention that she's been still taking a whole tablet of furosemide daily but can not say what the mg is.   Past Medical History:  Diagnosis Date   Asthma    Cardiomyopathy, nonischemic (HCC)    CHF (congestive heart failure) (HCC)    COPD (chronic obstructive pulmonary disease) (HCC)    Diabetes mellitus, type 2 (HCC)    Gastric ulcer without  hemorrhage or perforation    GERD (gastroesophageal reflux disease)    HTN (hypertension)    Hyperlipidemia    IDA (iron deficiency anemia) 04/05/2020   Obstructive sleep apnea on CPAP    Osteoarthritis    osteo of both knees   Scoliosis    Past Surgical History:  Procedure Laterality Date   ABDOMINAL HYSTERECTOMY     ankle (other)     APPENDECTOMY     BREAST BIOPSY Right 1999   neg   BREAST CYST ASPIRATION     neg   BREAST SURGERY     CARDIAC CATHETERIZATION     CLAVICLE SURGERY Right    Fx   COLONOSCOPY WITH PROPOFOL N/A 07/10/2016   Procedure: COLONOSCOPY WITH PROPOFOL;  Surgeon: Lollie Sails, MD;  Location: [facility] ENDOSCOPY;  Service: Endoscopy;  Laterality: N/A;   ear (otheR)     ESOPHAGOGASTRODUODENOSCOPY N/A 05/11/2019   Procedure: ESOPHAGOGASTRODUODENOSCOPY (EGD);  Surgeon: Toledo, Benay Pike, MD;  Location: [facility] ENDOSCOPY;  Service: Gastroenterology;  Laterality: N/A;   ESOPHAGOGASTRODUODENOSCOPY (EGD) WITH PROPOFOL N/A 07/10/2016   Procedure: ESOPHAGOGASTRODUODENOSCOPY (EGD) WITH PROPOFOL;  Surgeon: Lollie Sails, MD;  Location: [facility] ENDOSCOPY;  Service: Endoscopy;  Laterality: N/A;   ESOPHAGOGASTRODUODENOSCOPY (EGD) WITH PROPOFOL N/A 09/17/2017   Procedure: ESOPHAGOGASTRODUODENOSCOPY (EGD) WITH PROPOFOL;  Surgeon: Lollie Sails, MD;  Location: [facility] ENDOSCOPY;  Service: Endoscopy;  Laterality: N/A;   feet (other)     HERNIA REPAIR     sinus (other)     stomach  UPPER ESOPHAGEAL ENDOSCOPIC ULTRASOUND (EUS) N/A 11/22/2016   Procedure: UPPER ESOPHAGEAL ENDOSCOPIC ULTRASOUND (EUS);  Surgeon: Reita Cliche, MD;  Location: [facility] ENDOSCOPY;  Service: Gastroenterology;  Laterality: N/A;   Family History  Problem Relation Age of Onset   Alzheimer's disease Other    Heart attack Mother    Hypertension Mother    Cancer Brother        lung   Cancer Maternal Aunt        mouth and breast   Breast cancer Maternal Aunt    Cancer Daughter        throat    Dementia Father    Social History   Tobacco Use   Smoking status: Former    Packs/day: 1.00    Years: 15.00    Total pack years: 15.00    Types: Cigarettes    Quit date: 07/21/1982    Years since quitting: 39.3   Smokeless tobacco: Never  Substance Use Topics   Alcohol use: No    Alcohol/week: 15.0 standard drinks of alcohol    Types: 15 Standard drinks or equivalent per week    Comment: quit drinking 04/1981   Allergies  Allergen Reactions   Amoxicillin Other (See Comments)    GI distress   Aspirin Diarrhea and Nausea And Vomiting   Celebrex [Celecoxib] Nausea Only   Latex Itching and Swelling   Penicillins Other (See Comments)    GI distress   Salicylates    Allegra [Fexofenadine] Cough   Prior to Admission medications   Medication Sig Start Date End Date Taking? Authorizing Provider  albuterol (PROVENTIL HFA;VENTOLIN HFA) 108 (90 BASE) MCG/ACT inhaler Inhale 2 puffs into the lungs every 6 (six) hours as needed for wheezing or shortness of breath.   Yes [provider]  ascorbic acid (VITAMIN C) 500 MG tablet Take 500 mg by mouth daily.   Yes [provider]  aspirin 81 MG chewable tablet Chew by mouth daily.   Yes [provider]  benzonatate (TESSALON) 200 MG capsule Take 1 capsule by mouth 3 (three) times daily as needed. 02/16/21  Yes [provider]  busPIRone (BUSPAR) 5 MG tablet Take 1 tablet by mouth 3 (three) times daily. 07/15/20  Yes [provider]  calcium carbonate (OS-CAL) 600 MG TABS tablet Take 600 mg by mouth 2 (two) times daily with a meal.    Yes [provider]  cetirizine (ZYRTEC) 10 MG tablet Take 10 mg by mouth daily as needed.   Yes [provider]  cholecalciferol (VITAMIN D) 400 units TABS tablet Take 1,000 Units by mouth daily.   Yes [provider]  [facility] Liver Oil w/Vit A & D CAPS Take 1 capsule by mouth daily.   Yes [provider]  ferrous gluconate (FERGON) 324 MG  tablet Take 324 mg by mouth daily with breakfast.   Yes [provider]  Ferrous Gluconate 324 (37.5 Fe) MG TABS Take 1 tablet by mouth daily. 08/11/21  Yes [provider]  furosemide (LASIX) 40 MG tablet Take 0.5 tablets (20 mg total) by mouth daily. 08/11/21  Yes Wieting, Richard, MD  glimepiride (AMARYL) 2 MG tablet Take 2 mg by mouth daily with breakfast.   Yes [provider]  isosorbide mononitrate (IMDUR) 30 MG 24 hr tablet Take 30 mg by mouth daily. 04/06/21 04/06/22 Yes [provider]  levocetirizine (XYZAL) 5 MG tablet Take 5 mg by mouth every evening.   Yes [provider]  metoprolol  succinate (TOPROL-XL) 25 MG 24 hr tablet Take 1 tablet (25 mg total) by mouth daily. 08/11/21  Yes Wieting, Richard, MD  Multiple Vitamin (MULTIVITAMIN) tablet Take 1 tablet by mouth daily.    Yes [provider]  pantoprazole (PROTONIX) 40 MG tablet Take 40 mg by mouth 2 (two) times daily.   Yes [provider]  polyethylene glycol (MIRALAX / GLYCOLAX) 17 g packet Take 17 g by mouth daily as needed for severe constipation. 08/11/21  Yes Wieting, Richard, MD  rosuvastatin (CRESTOR) 20 MG tablet Take 1 tablet by mouth daily. 05/18/21  Yes [provider]  sacubitril-valsartan (ENTRESTO) 24-26 MG Take 1 tablet by mouth 2 (two) times daily. 08/11/21  Yes Wieting, Richard, MD  sucralfate (CARAFATE) 1 g tablet Take 1 tablet by mouth 2 (two) times daily before a meal. 07/18/21  Yes [provider]  traMADol-acetaminophen (ULTRACET) 37.5-325 MG tablet Take 1 tablet by mouth every 8 (eight) hours as needed. 07/18/21  Yes [provider]  traZODone (DESYREL) 100 MG tablet Take 1 tablet by mouth at bedtime. 06/16/21  Yes [provider]  feeding supplement (ENSURE ENLIVE / ENSURE PLUS) LIQD Take 237 mLs by mouth 3 (three) times daily between meals. Patient not taking: Reported on 11/28/2021 08/11/21   Loletha Grayer, MD     Review of Systems  Constitutional:  Positive for fatigue (tire easily). Negative for appetite change.  HENT:  Positive for hearing loss. Negative for congestion, rhinorrhea and sore throat.   Eyes: Negative.   Respiratory:  Positive for shortness of breath. Negative for cough, chest tightness and wheezing.   Cardiovascular:  Negative for chest pain, palpitations and leg swelling.  Gastrointestinal:  Negative for abdominal distention and abdominal pain.  Endocrine: Negative.   Genitourinary: Negative.   Musculoskeletal:  Positive for arthralgias (bilateral knee pain). Negative for back pain and neck pain.  Skin: Negative.   Allergic/Immunologic: Negative.   Neurological:  Positive for weakness and light-headedness. Negative for dizziness.  Hematological:  Negative for adenopathy. Does not bruise/bleed easily.  Psychiatric/Behavioral:  Negative for dysphoric mood and sleep disturbance (sleeping on 3 pillows). The patient is not nervous/anxious.    Vitals:   11/28/21 1059  BP: (!) 150/59  Pulse: 88  Resp: 16  SpO2: 100%  Weight: 174 lb (78.9 kg)  Height: '5\' 3"'$  (1.6 m)   Wt Readings from Last 3 Encounters:  11/28/21 174 lb (78.9 kg)  09/20/21 168 lb 4 oz (76.3 kg)  08/11/21 170 lb 10.2 oz (77.4 kg)   Lab Results  Component Value Date   CREATININE 1.13 (H) 08/11/2021   CREATININE 1.09 (H) 08/09/2021   CREATININE 1.07 (H) 08/08/2021   Physical Exam Vitals and nursing note reviewed.  Constitutional:      Appearance: She is well-developed.  HENT:     Head: Normocephalic and atraumatic.  Neck:     Vascular: No JVD.  Cardiovascular:     Rate and Rhythm: Normal rate and regular rhythm.  Pulmonary:     Effort: Pulmonary effort is normal.     Breath sounds: No wheezing or rales.  Abdominal:     General: There is no distension.     Palpations: Abdomen is soft.     Tenderness: There is no abdominal tenderness.  Musculoskeletal:        General: No tenderness.     Cervical  back: Normal range of motion and neck supple.  Skin:    General: Skin is warm and dry.  Neurological:     Mental Status: She is alert and oriented to person, place, and time.  Psychiatric:        Behavior: Behavior normal.        Thought Content: Thought content normal.    Assessment & Plan:  1: Chronic heart failure with reduced ejection fraction- - NYHA class III - euvolemic - weighing daily; reminded to call for an overnight weight gain of > 2 pounds or a weekly weight gain of > 5 pounds - weight up 6 pounds from last visit here 2 months ago; admits to not being very active because of the fatigue/ weakness - not adding any salt to her food - drinking around 40-60 ounces of fluid daily - on GDMT of metoprolol & entresto - called patient's pharmacy to clarify diuretic instructions; confirmed that she has '40mg'$  furosemide tablet and she's to take 1/2 tablet daily - this information was put on her AVS and explained that she could take an additional 1/2 tablet if needed for above weight gain or swelling - saw cardiology (Vance) 06/28/21 - BNP 07/30/21 was 486.9  2: HTN- - BP elevated (150/59) - saw her PCP (Hande) 09/27/21; returns in 6 months.  - BMP from 09/20/21 reviewed and shows sodium 139, potassium 4.6, creatinine 1.3 and GFR 48 - saw nephrology (Korrapati) 07/13/21  3: Diabetes- - A1c 09/20/21 was 6.5% - home glucose yesterday was 100  4: Anemia- - saw hematology Tasia Catchings) 04/06/21 - hemoglobin 09/20/21 was 11.1   Patient did not bring her medications nor a list. Each medication was verbally reviewed with the patient and she was encouraged to bring the bottles to every visit to confirm accuracy of list. Patient is unclear of exactly what she's taking. Have asked her pharmacy to fax [place] a list.   Return in April 2024 (per her preference), sooner if needed for any questions/ problems before then.

## 2022-01-17 ENCOUNTER — Encounter: Payer: Self-pay | Admitting: Oncology

## 2022-01-24 ENCOUNTER — Ambulatory Visit (LOCAL_COMMUNITY_HEALTH_CENTER): Payer: Medicare Other

## 2022-01-24 DIAGNOSIS — Z23 Encounter for immunization: Secondary | ICD-10-CM | POA: Diagnosis not present

## 2022-01-24 DIAGNOSIS — Z719 Counseling, unspecified: Secondary | ICD-10-CM

## 2022-01-24 NOTE — Progress Notes (Signed)
Pt was seen in clinic requesting Shingles and Meningococcal vaccines. 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 Reviewed [facility] standing order, pt doesn't meet any category for Meningococcal vaccine. Consulted with Dr. Ernestina Patches via Epic who gave order to give Shingles vaccine today and recommended to follow up with her PCP regarding Meningitis vaccine. Pt informed of the order received and administered Shingrix vaccine, tolerated well. Provided VIS and NCIR copy. M.Marion Rosenberry, LPN.

## 2022-01-30 NOTE — Progress Notes (Signed)
Attestation of Attending Supervision of clinical support staff: I agree with the care provided to this patient and was available for any consultation.  I have reviewed the LPN's note and chart, and I agree with the management and plan.  Lauretta Chester MD MPH, ABFM

## 2022-03-06 ENCOUNTER — Encounter: Payer: Self-pay | Admitting: Oncology

## 2022-03-06 ENCOUNTER — Encounter: Payer: Self-pay | Admitting: Unknown Physician Specialty

## 2022-03-06 ENCOUNTER — Other Ambulatory Visit: Payer: Self-pay

## 2022-03-08 ENCOUNTER — Encounter: Payer: Self-pay | Admitting: Oncology

## 2022-03-08 ENCOUNTER — Other Ambulatory Visit: Payer: Self-pay

## 2022-03-08 MED ORDER — CIPROFLOXACIN-DEXAMETHASONE 0.3-0.1 % OT SUSP
4.0000 [drp] | Freq: Two times a day (BID) | OTIC | 0 refills | Status: DC
  Filled 2022-03-08: qty 7.5, 19d supply, fill #0

## 2022-03-09 NOTE — Discharge Instructions (Signed)
[facility] DISCHARGE INSTRUCTIONS FOR MYRINGOTOMY AND TUBE INSERTION  [facility] CHAPMAN T. MCQUEEN, M.D.   Diet:   After surgery, the patient should take only liquids and foods as tolerated.  The patient may then have a regular diet after the effects of anesthesia have worn off, usually about four to six hours after surgery.  Activities:   The patient should rest until the effects of anesthesia have worn off.  After this, there are no restrictions on the normal daily activities.  Medications:   You will be given a prescription for antibiotic drops to be used in the ears postoperatively.  It is recommended to use 4 drops 2 times a day for 7 days, then the drops should be saved for possible future use.  The tubes should not cause any discomfort to the patient, but if there is any question, Tylenol should be given according to the instructions for the age of the patient.  Other medications should be continued normally.  Precautions:   Should there be recurrent drainage after the tubes are placed, the drops should be used for approximately 3-4 days.  If it does not clear, you should call the ENT office.  Earplugs:   Earplugs are only needed for those who are going to be submerged under water.  When taking a bath or shower and using a cup or showerhead to rinse hair, it is not necessary to wear earplugs.  These come in a variety of fashions, all of which can be obtained at our office.  However, if one is not able to come by the office, then silicone plugs can be found at most pharmacies.  It is not advised to stick anything in the ear that is not approved as an earplug.  Silly putty is not to be used as an earplug.  Swimming is allowed in patients after ear tubes are inserted, however, they must wear earplugs if they are going to be submerged under water.  For those children who are going to be swimming a lot, it is recommended to use a fitted ear mold, which can be  made by our audiologist.  If discharge is noticed from the ears, this most likely represents an ear infection.  We would recommend getting your eardrops and using them as indicated above.  If it does not clear, then you should call the ENT office.  For follow up, the patient should return to the ENT office three weeks postoperatively and then every six months as required by the doctor. 

## 2022-03-16 ENCOUNTER — Ambulatory Visit
Admission: RE | Admit: 2022-03-16 | Discharge: 2022-03-16 | Disposition: A | Discharge status: Home / Self Care | Payer: 59 | Attending: Unknown Physician Specialty | Admitting: Unknown Physician Specialty

## 2022-03-16 ENCOUNTER — Encounter
Admission: RE | Disposition: A | Discharge status: Home / Self Care | Payer: Self-pay | Source: Home / Self Care | Attending: Unknown Physician Specialty

## 2022-03-16 ENCOUNTER — Encounter: Payer: Self-pay | Admitting: Unknown Physician Specialty

## 2022-03-16 ENCOUNTER — Ambulatory Visit: Payer: 59 | Admitting: Anesthesiology

## 2022-03-16 ENCOUNTER — Other Ambulatory Visit: Payer: Self-pay

## 2022-03-16 DIAGNOSIS — I110 Hypertensive heart disease with heart failure: Secondary | ICD-10-CM | POA: Insufficient documentation

## 2022-03-16 DIAGNOSIS — I509 Heart failure, unspecified: Secondary | ICD-10-CM | POA: Insufficient documentation

## 2022-03-16 DIAGNOSIS — E669 Obesity, unspecified: Secondary | ICD-10-CM | POA: Insufficient documentation

## 2022-03-16 DIAGNOSIS — Z87891 Personal history of nicotine dependence: Secondary | ICD-10-CM | POA: Diagnosis not present

## 2022-03-16 DIAGNOSIS — H6533 Chronic mucoid otitis media, bilateral: Secondary | ICD-10-CM | POA: Insufficient documentation

## 2022-03-16 DIAGNOSIS — E119 Type 2 diabetes mellitus without complications: Secondary | ICD-10-CM | POA: Diagnosis not present

## 2022-03-16 DIAGNOSIS — Z79899 Other long term (current) drug therapy: Secondary | ICD-10-CM

## 2022-03-16 DIAGNOSIS — Z6832 Body mass index (BMI) 32.0-32.9, adult: Secondary | ICD-10-CM | POA: Diagnosis not present

## 2022-03-16 DIAGNOSIS — Z01812 Encounter for preprocedural laboratory examination: Secondary | ICD-10-CM

## 2022-03-16 HISTORY — DX: Other seasonal allergic rhinitis: J30.2

## 2022-03-16 HISTORY — PX: MYRINGOTOMY WITH TUBE PLACEMENT: SHX5663

## 2022-03-16 HISTORY — DX: Unspecified hearing loss, unspecified ear: H91.90

## 2022-03-16 HISTORY — DX: Complete loss of teeth, unspecified cause, unspecified class: Z97.2

## 2022-03-16 HISTORY — DX: Complete loss of teeth, unspecified cause, unspecified class: K08.109

## 2022-03-16 LAB — GLUCOSE, CAPILLARY: Glucose-Capillary: 136 mg/dL — ABNORMAL HIGH (ref 70–99)

## 2022-03-16 SURGERY — MYRINGOTOMY WITH TUBE PLACEMENT
Anesthesia: General | Site: Ear | Laterality: Bilateral

## 2022-03-16 MED ORDER — LACTATED RINGERS IV SOLN: INTRAVENOUS | Status: DC

## 2022-03-16 MED ORDER — FENTANYL CITRATE (PF) 100 MCG/2ML IJ SOLN
INTRAMUSCULAR | Status: DC | PRN
  Administered 2022-03-16: 50 ug via INTRAVENOUS

## 2022-03-16 MED ORDER — LIDOCAINE HCL (CARDIAC) PF 100 MG/5ML IV SOSY
PREFILLED_SYRINGE | INTRAVENOUS | Status: DC | PRN
  Administered 2022-03-16: 50 mg via INTRAVENOUS

## 2022-03-16 MED ORDER — CIPROFLOXACIN-DEXAMETHASONE 0.3-0.1 % OT SUSP
OTIC | Status: DC | PRN
  Administered 2022-03-16: 1 [drp] via OTIC

## 2022-03-16 MED ORDER — PROPOFOL 10 MG/ML IV BOLUS
INTRAVENOUS | Status: DC | PRN
  Administered 2022-03-16: 100 mg via INTRAVENOUS

## 2022-03-16 SURGICAL SUPPLY — 10 items
BALL CTTN LRG ABS STRL LF (GAUZE/BANDAGES/DRESSINGS) ×1
BLADE MYR LANCE NRW W/HDL (BLADE) IMPLANT
CANISTER SUCT 1200ML W/VALVE (MISCELLANEOUS) ×1 IMPLANT
COTTONBALL LRG STERILE PKG (GAUZE/BANDAGES/DRESSINGS) ×1 IMPLANT
GLOVE SURG ENC TEXT LTX SZ7.5 (GLOVE) ×1 IMPLANT
STRAP BODY AND KNEE 60X3 (MISCELLANEOUS) ×1 IMPLANT
TOWEL OR 17X26 4PK STRL BLUE (TOWEL DISPOSABLE) ×1 IMPLANT
TUBE EAR T 1.27X5.3 BFLY (OTOLOGIC RELATED) IMPLANT
TUBING CONN 6MMX3.1M (TUBING) ×1
TUBING SUCTION CONN 0.25 STRL (TUBING) ×1 IMPLANT

## 2022-03-16 NOTE — Anesthesia Postprocedure Evaluation (Signed)
Anesthesia Post Note  Patient: Diane Patton  Procedure(s) Performed: MYRINGOTOMY WITH TUBE PLACEMENT WITH BUTTERFLY TUBES (Bilateral: Ear)  Patient location during evaluation: PACU Anesthesia Type: General Level of consciousness: awake and alert Pain management: pain level controlled Vital Signs Assessment: post-procedure vital signs reviewed and stable Respiratory status: spontaneous breathing, nonlabored ventilation, respiratory function stable and patient connected to nasal cannula oxygen Cardiovascular status: blood pressure returned to baseline and stable Postop Assessment: no apparent nausea or vomiting Anesthetic complications: no  No notable events documented.   Last Vitals:  Vitals:   03/16/22 0925 03/16/22 0930  BP:  135/60  Pulse: 86   Resp: 14 14  Temp:    SpO2: 97%     Last Pain:  Vitals:   03/16/22 0930  PainSc: 0-No pain                 Dimas Millin

## 2022-03-16 NOTE — H&P (Signed)
The patient's history has been reviewed, patient examined, no change in status, stable for surgery.  Questions were answered to the patients satisfaction.  

## 2022-03-16 NOTE — Transfer of Care (Signed)
Immediate Anesthesia Transfer of Care Note  Patient: Diane Patton  Procedure(s) Performed: MYRINGOTOMY WITH TUBE PLACEMENT WITH BUTTERFLY TUBES (Bilateral: Ear)  Patient Location: PACU  Anesthesia Type: General  Level of Consciousness: awake, alert  and patient cooperative  Airway and Oxygen Therapy: Patient Spontanous Breathing and Patient connected to supplemental oxygen  Post-op Assessment: Post-op Vital signs reviewed, Patient's Cardiovascular Status Stable, Respiratory Function Stable, Patent Airway and No signs of Nausea or vomiting  Post-op Vital Signs: Reviewed and stable  Complications: No notable events documented.

## 2022-03-16 NOTE — Anesthesia Preprocedure Evaluation (Signed)
Anesthesia Evaluation  Patient identified by MRN, date of birth, ID band Patient awake    Reviewed: Allergy & Precautions, NPO status , Patient's Chart, lab work & pertinent test results  History of Anesthesia Complications Negative for: history of anesthetic complications  Airway Mallampati: II  TM Distance: >3 FB Neck ROM: Full    Dental  (+) Edentulous Lower, Edentulous Upper   Pulmonary neg pulmonary ROS, neg sleep apnea, neg COPD, Patient abstained from smoking.Not current smoker, former smoker   breath sounds clear to auscultation- rhonchi (-) wheezing      Cardiovascular Exercise Tolerance: Good METShypertension, Pt. on medications +CHF  (-) CAD, (-) Past MI, (-) Cardiac Stents and (-) CABG (-) dysrhythmias  Rhythm:Regular Rate:Normal - Systolic murmurs and - Diastolic murmurs Echo 10/07/54: MILD LV SYSTOLIC DYSFUNCTION (See above)   WITH MODERATE LVH NORMAL RIGHT VENTRICULAR SYSTOLIC FUNCTION MILD VALVULAR REGURGITATION (See above) NO VALVULAR STENOSIS MILD to MODERATE TR MILD to MODERATE PHTN EF 40%   Neuro/Psych negative neurological ROS  negative psych ROS   GI/Hepatic Neg liver ROS,neg GERD  ,,(+)     (-) substance abuse    Endo/Other  diabetes, Oral Hypoglycemic Agents    Renal/GU negative Renal ROS     Musculoskeletal  (+) Arthritis ,    Abdominal  (+) + obese  Peds  Hematology negative hematology ROS (+)   Anesthesia Other Findings Past Medical History: No date: Asthma No date: Cardiomyopathy, nonischemic (HCC) No date: CHF (congestive heart failure) (HCC) No date: COPD (chronic obstructive pulmonary disease) (HCC) No date: Diabetes mellitus, type 2 (HCC) No date: Gastric ulcer without hemorrhage or perforation No date: GERD (gastroesophageal reflux disease) No date: HTN (hypertension) No date: Hyperlipidemia No date: IDA (iron deficiency anemia) No date: Obstructive sleep apnea on  CPAP No date: Osteoarthritis     Comment:  osteo of both knees No date: Scoliosis  Reproductive/Obstetrics                              Anesthesia Physical Anesthesia Plan  ASA: 3  Anesthesia Plan: General   Post-op Pain Management:    Induction: Intravenous  PONV Risk Score and Plan: 2 and Propofol infusion  Airway Management Planned: Natural Airway and Nasal Cannula  Additional Equipment: None  Intra-op Plan:   Post-operative Plan:   Informed Consent: I have reviewed the patients History and Physical, chart, labs and discussed the procedure including the risks, benefits and alternatives for the proposed anesthesia with the patient or authorized representative who has indicated his/her understanding and acceptance.     Dental Advisory Given  Plan Discussed with: Anesthesiologist, CRNA and Surgeon  Anesthesia Plan Comments: (Patient consented for risks of anesthesia including but not limited to:  - adverse reactions to medications - risk of airway placement if required - damage to eyes, teeth, lips or other oral mucosa - nerve damage due to positioning  - sore throat or hoarseness - Damage to heart, brain, nerves, lungs, other parts of body or loss of life  Patient voiced understanding.)         Anesthesia Quick Evaluation

## 2022-03-16 NOTE — Op Note (Signed)
03/16/2022  9:07 AM    Diane Patton  [id number]   Pre-Op Dx: Otitis Media  Post-op Dx: Same  Proc:Bilateral myringotomy with tubes  Surg: Roena Malady  Anes:  General by mask  EBL:  None  Findings:  R-serous fluid L-serous fluid  Procedure: With the patient in a comfortable supine position, general mask anesthesia was administered.  At an appropriate level, microscope and speculum were used to examine and clean the RIGHT ear canal.  The findings were as described above.  An anterior inferior radial myringotomy incision was sharply executed.  Middle ear contents were suctioned clear.  A butterfly PE tube was placed without difficulty.  Ciprodex otic solution was instilled into the external canal, and insufflated into the middle ear.  A cotton ball was placed at the external meatus. Hemostasis was observed.  This side was completed.  After completing the RIGHT side, the LEFT side was done in identical fashion.    Following this  The patient was returned to anesthesia, awakened, and transferred to recovery in stable condition.  Dispo:  PACU to home  Plan: Routine drop use and water precautions.  Recheck my office three weeks.   Roena Malady  9:07 AM  03/16/2022

## 2022-03-19 ENCOUNTER — Encounter: Payer: Self-pay | Admitting: Unknown Physician Specialty

## 2022-05-30 ENCOUNTER — Encounter: Payer: Self-pay | Admitting: Family

## 2022-05-30 ENCOUNTER — Ambulatory Visit: Payer: 59 | Attending: Family | Admitting: Family

## 2022-05-30 VITALS — BP 141/56 | HR 71 | Resp 14 | Wt 174.2 lb

## 2022-05-30 DIAGNOSIS — I509 Heart failure, unspecified: Secondary | ICD-10-CM | POA: Insufficient documentation

## 2022-05-30 DIAGNOSIS — I272 Pulmonary hypertension, unspecified: Secondary | ICD-10-CM | POA: Diagnosis present

## 2022-05-30 DIAGNOSIS — I110 Hypertensive heart disease with heart failure: Secondary | ICD-10-CM | POA: Insufficient documentation

## 2022-05-30 DIAGNOSIS — K219 Gastro-esophageal reflux disease without esophagitis: Secondary | ICD-10-CM | POA: Diagnosis not present

## 2022-05-30 DIAGNOSIS — M1990 Unspecified osteoarthritis, unspecified site: Secondary | ICD-10-CM | POA: Diagnosis not present

## 2022-05-30 DIAGNOSIS — I5022 Chronic systolic (congestive) heart failure: Secondary | ICD-10-CM

## 2022-05-30 DIAGNOSIS — J4489 Other specified chronic obstructive pulmonary disease: Secondary | ICD-10-CM | POA: Diagnosis not present

## 2022-05-30 DIAGNOSIS — Z79899 Other long term (current) drug therapy: Secondary | ICD-10-CM | POA: Diagnosis not present

## 2022-05-30 DIAGNOSIS — I10 Essential (primary) hypertension: Secondary | ICD-10-CM | POA: Diagnosis not present

## 2022-05-30 DIAGNOSIS — G4733 Obstructive sleep apnea (adult) (pediatric): Secondary | ICD-10-CM | POA: Insufficient documentation

## 2022-05-30 DIAGNOSIS — E119 Type 2 diabetes mellitus without complications: Secondary | ICD-10-CM | POA: Diagnosis not present

## 2022-05-30 DIAGNOSIS — I428 Other cardiomyopathies: Secondary | ICD-10-CM | POA: Insufficient documentation

## 2022-05-30 DIAGNOSIS — N181 Chronic kidney disease, stage 1: Secondary | ICD-10-CM | POA: Diagnosis not present

## 2022-05-30 DIAGNOSIS — D649 Anemia, unspecified: Secondary | ICD-10-CM | POA: Insufficient documentation

## 2022-05-30 DIAGNOSIS — E785 Hyperlipidemia, unspecified: Secondary | ICD-10-CM | POA: Insufficient documentation

## 2022-05-30 DIAGNOSIS — D631 Anemia in chronic kidney disease: Secondary | ICD-10-CM

## 2022-05-30 MED ORDER — EMPAGLIFLOZIN 10 MG PO TABS: 10.0000 mg | ORAL_TABLET | Freq: Every day | ORAL | 5 refills | Status: DC

## 2022-05-30 NOTE — Progress Notes (Signed)
Patient ID: Diane Patton, female    DOB: 09/27/1945, 77 y.o.   MRN: [id number]  Primary cardiologist: Leanora Ivanoffrane, Anna, PA/ Marcina MillardParaschos, Alexander, MD (last seen 03/24) PCP: Barbette ReichmannHande, Vishwanath, MD (last seen 02/24)  HPI  Diane Patton is a 77 y/o female with a history of osteoarthritis, obstructive sleep apnea, hyperlipidemia, HTN, GERD, DM, COPD, pulmonary HTN, remote tobacco use and chronic heart failure.  Echo 07/18/21: EF of 35%. Echo 08/17/15: EF of 40% with mild MR, AR and TR. Mild/mod pulmonary HTN. This is a slight improvement from 12/06/13 when her EF was 35-40%.  . Had cardiac catheterization done 05/12/14.  ENT same day surgery 01/24.  She presents today for a HF follow-up visit with a chief complaint of moderate fatigue with minimal exertion. Chronic in nature. Has SOB, weakness and chronic joint pain along with this. Denies difficulty sleeping, dizziness, abdominal distention, palpitations, pedal edema, chest pain, wheezing, cough or weight gain.   Has not taken her medications yet today because she hasn't eaten breakfast but will do so upon her return home.   Past Medical History:  Diagnosis Date   Asthma    Cardiomyopathy, nonischemic (HCC)    CHF (congestive heart failure) (HCC)    COPD (chronic obstructive pulmonary disease) (HCC)    Diabetes mellitus, type 2 (HCC)    Full dentures    Gastric ulcer without hemorrhage or perforation    GERD (gastroesophageal reflux disease)    HOH (hard of hearing)    Right Ear   HTN (hypertension)    Hyperlipidemia    IDA (iron deficiency anemia) 04/05/2020   Obstructive sleep apnea on CPAP    Not using CPAP   Osteoarthritis    osteo of both knees   Scoliosis    Seasonal allergies    Past Surgical History:  Procedure Laterality Date   ABDOMINAL HYSTERECTOMY     ankle (other)     APPENDECTOMY     BREAST BIOPSY Right 1999   neg   BREAST CYST ASPIRATION     neg   BREAST SURGERY     CARDIAC CATHETERIZATION     CLAVICLE SURGERY Right     Fx   COLONOSCOPY WITH PROPOFOL N/A 07/10/2016   Procedure: COLONOSCOPY WITH PROPOFOL;  Surgeon: Christena DeemSkulskie, Martin U, MD;  [facility];  Service: Endoscopy;  Laterality: N/A;   ear (otheR)     ESOPHAGOGASTRODUODENOSCOPY N/A 05/11/2019   Procedure: ESOPHAGOGASTRODUODENOSCOPY (EGD);  Surgeon: Toledo, Boykin Nearingeodoro K, MD;  Location: [facility] ENDOSCOPY;  Service: Gastroenterology;  Laterality: N/A;   ESOPHAGOGASTRODUODENOSCOPY (EGD) WITH PROPOFOL N/A 07/10/2016   Procedure: ESOPHAGOGASTRODUODENOSCOPY (EGD) WITH PROPOFOL;  Surgeon: Christena DeemSkulskie, Martin U, MD;  [facility];  Service: Endoscopy;  Laterality: N/A;   ESOPHAGOGASTRODUODENOSCOPY (EGD) WITH PROPOFOL N/A 09/17/2017   Procedure: ESOPHAGOGASTRODUODENOSCOPY (EGD) WITH PROPOFOL;  Surgeon: Christena DeemSkulskie, Martin U, MD;  [facility];  Service: Endoscopy;  Laterality: N/A;   feet (other)     HERNIA REPAIR     MYRINGOTOMY WITH TUBE PLACEMENT Bilateral 03/16/2022   Procedure: MYRINGOTOMY WITH TUBE PLACEMENT WITH BUTTERFLY TUBES;  Surgeon: Linus SalmonsMcQueen, Chapman, MD;  [facility];  Service: ENT;  Laterality: Bilateral;   sinus (other)     stomach     UPPER ESOPHAGEAL ENDOSCOPIC ULTRASOUND (EUS) N/A 11/22/2016   Procedure: UPPER ESOPHAGEAL ENDOSCOPIC ULTRASOUND (EUS);  Surgeon: Doren CustardSpaete, Joshua P, MD;  [facility];  Service: Gastroenterology;  Laterality: N/A;   Family History  Problem Relation Age of Onset   Alzheimer's disease  Other    Heart attack Mother    Hypertension Mother    Cancer Brother        lung   Cancer Maternal Aunt        mouth and breast   Breast cancer Maternal Aunt    Cancer Daughter        throat   Dementia Father    Social History   Tobacco Use   Smoking status: Former    Packs/day: 1.00    Years: 15.00    Additional pack years: 0.00    Total pack years: 15.00    Types: Cigarettes    Quit date: 07/21/1982    Years since quitting: 39.8   Smokeless tobacco: Never  Substance  Use Topics   Alcohol use: No    Alcohol/week: 15.0 standard drinks of alcohol    Types: 15 Standard drinks or equivalent per week    Comment: quit drinking 04/1981   Allergies  Allergen Reactions   Amoxicillin Other (See Comments)    GI distress   Aspirin Diarrhea and Nausea And Vomiting    Can use coated aspirin   Celebrex [Celecoxib] Nausea Only   Latex Itching and Swelling    gloves   Penicillins Other (See Comments)    GI distress   Salicylates    Allegra [Fexofenadine] Cough   Prior to Admission medications   Medication Sig Start Date End Date Taking? Authorizing Provider  albuterol (PROVENTIL HFA;VENTOLIN HFA) 108 (90 BASE) MCG/ACT inhaler Inhale 2 puffs into the lungs every 6 (six) hours as needed for wheezing or shortness of breath.   Yes [provider]  ascorbic acid (VITAMIN C) 500 MG tablet Take 500 mg by mouth daily.   Yes [provider]  aspirin 81 MG chewable tablet Chew by mouth daily.   Yes [provider]  benzonatate (TESSALON) 200 MG capsule Take 1 capsule by mouth 3 (three) times daily as needed. 02/16/21  Yes [provider]  busPIRone (BUSPAR) 5 MG tablet Take 1 tablet by mouth 3 (three) times daily. 07/15/20  Yes [provider]  calcium carbonate (OS-CAL) 600 MG TABS tablet Take 600 mg by mouth 2 (two) times daily with a meal.    Yes [provider]  cetirizine (ZYRTEC) 10 MG tablet Take 10 mg by mouth daily as needed.   Yes [provider]  cholecalciferol (VITAMIN D) 400 units TABS tablet Take 1,000 Units by mouth daily.   Yes [provider]  ciprofloxacin-dexamethasone (CIPRODEX) OTIC suspension Place 4 drops into both ears 2 (two) times daily for 7 days. DOS 03/16/22 02/26/22  Yes Linus Salmons, MD  [facility] Liver Oil w/Vit A & D CAPS Take 1 capsule by mouth daily.   Yes [provider]  feeding supplement (ENSURE ENLIVE / ENSURE PLUS) LIQD Take 237 mLs by mouth 3 (three) times  daily between meals. 08/11/21  Yes Wieting, Richard, MD  ferrous gluconate (FERGON) 324 MG tablet Take 324 mg by mouth daily with breakfast.   Yes [provider]  furosemide (LASIX) 40 MG tablet Take 0.5 tablets (20 mg total) by mouth daily. 08/11/21  Yes Wieting, Richard, MD  glimepiride (AMARYL) 2 MG tablet Take 2 mg by mouth daily with breakfast.   Yes [provider]  hydrALAZINE (APRESOLINE) 25 MG tablet Take 1 tablet by mouth 2 (two) times daily. 12/11/21 12/11/22 Yes [provider]  isosorbide mononitrate (IMDUR) 30 MG 24 hr tablet Take 1 tablet by mouth daily. 02/05/22  Yes [provider]  levocetirizine (XYZAL) 5 MG tablet Take by mouth every evening.   Yes [provider]  metFORMIN (GLUCOPHAGE) 500 MG tablet Take 1 tablet by mouth 2 (two) times daily with a meal. 02/21/22  Yes [provider]  metoprolol succinate (TOPROL-XL) 25 MG 24 hr tablet Take 1 tablet (25 mg total) by mouth daily. 08/11/21  Yes Wieting, Richard, MD  montelukast (SINGULAIR) 10 MG tablet Take 1 tablet by mouth daily. 02/21/22  Yes [provider]  Multiple Vitamin (MULTIVITAMIN) tablet Take 1 tablet by mouth daily.    Yes [provider]  pantoprazole (PROTONIX) 40 MG tablet Take 40 mg by mouth 2 (two) times daily.   Yes [provider]  polyethylene glycol (MIRALAX / GLYCOLAX) 17 g packet Take 17 g by mouth daily as needed for severe constipation. 08/11/21  Yes Wieting, Richard, MD  rosuvastatin (CRESTOR) 20 MG tablet Take 1 tablet by mouth daily. 05/18/21  Yes [provider]  sacubitril-valsartan (ENTRESTO) 24-26 MG Take 1 tablet by mouth 2 (two) times daily. 08/11/21  Yes Wieting, Richard, MD  sucralfate (CARAFATE) 1 g tablet Take 1 tablet by mouth 2 (two) times daily before a meal. 07/18/21  Yes [provider]  traMADol-acetaminophen (ULTRACET) 37.5-325 MG tablet Take 1 tablet by mouth every 8 (eight) hours as needed.  07/18/21  Yes [provider]  traZODone (DESYREL) 100 MG tablet Take 1 tablet by mouth at bedtime. 06/16/21  Yes [provider]   Review of Systems  Constitutional:  Positive for fatigue (tire easily). Negative for appetite change.  HENT:  Positive for hearing loss. Negative for congestion, rhinorrhea and sore throat.   Eyes: Negative.   Respiratory:  Positive for shortness of breath. Negative for cough, chest tightness and wheezing.   Cardiovascular:  Negative for chest pain, palpitations and leg swelling.  Gastrointestinal:  Negative for abdominal distention and abdominal pain.  Endocrine: Negative.   Genitourinary: Negative.   Musculoskeletal:  Positive for arthralgias (bilateral knee pain). Negative for back pain and neck pain.  Skin: Negative.   Allergic/Immunologic: Negative.   Neurological:  Positive for weakness. Negative for dizziness and light-headedness.  Hematological:  Negative for adenopathy. Does not bruise/bleed easily.  Psychiatric/Behavioral:  Negative for dysphoric mood and sleep disturbance (sleeping on 3 pillows). The patient is not nervous/anxious.    Vitals:   05/30/22 1057  BP: (!) 141/56  Pulse: 71  Resp: 14  SpO2: 100%  Weight: 174 lb 4 oz (79 kg)   Wt Readings from Last 3 Encounters:  05/30/22 174 lb 4 oz (79 kg)  03/16/22 178 lb (80.7 kg)  11/28/21 174 lb (78.9 kg)   Lab Results  Component Value Date   CREATININE 1.13 (H) 08/11/2021   CREATININE 1.09 (H) 08/09/2021   CREATININE 1.07 (H) 08/08/2021   Physical Exam Vitals and nursing note reviewed.  Constitutional:      Appearance: She is well-developed.  HENT:     Head: Normocephalic and atraumatic.  Neck:     Vascular: No JVD.  Cardiovascular:     Rate and Rhythm: Normal rate and regular rhythm.  Pulmonary:     Effort: Pulmonary effort is normal.     Breath sounds: No wheezing or rales.  Abdominal:     General: There is no distension.     Palpations: Abdomen is soft.      Tenderness: There is no abdominal tenderness.  Musculoskeletal:        General: No  tenderness.     Cervical back: Normal range of motion and neck supple.     Right lower leg: Edema (trace pitting) present.     Left lower leg: Edema (trace pitting) present.  Skin:    General: Skin is warm and dry.  Neurological:     Mental Status: She is alert and oriented to person, place, and time.  Psychiatric:        Behavior: Behavior normal.        Thought Content: Thought content normal.   Assessment & Plan:  1: NICM with reduced ejection fraction- - NYHA class III - euvolemic - weighing daily; reminded to call for an overnight weight gain of > 2 pounds or a weekly weight gain of > 5 pounds - weight unchanged from last visit here 6 months ago - Echo 07/18/21: EF of 35%. Echo 08/17/15: EF of 40% with mild MR, AR and TR. Mild/mod pulmonary HTN. This is a slight improvement from 12/06/13 when her EF was 35-40%.  - cardiac catheterization done 05/12/14. - not adding any salt to her food - drinking around 40-60 ounces of fluid daily - continue metoprolol succinate 25mg  daily - continue entresto 24/26mg  BID - continue furosemide 20mg  daily - begin jardiance 10mg  daily; 30 day voucher provided - BMP next visit - saw cardiology Mellissa Kohut) 03/24; returns 07/24 - BNP 07/30/21 was 486.9  2: HTN- - BP 141/56 - saw her PCP (Hande) 02/24; returns 08/24 - BMP from 03/27/22 reviewed and shows sodium 139, potassium 4.5, creatinine 1.13 and GFR 50 - saw nephrology Suezanne Jacquet) 07/13/21  3: Diabetes- - A1c 03/27/22 was 7.0% - checks glucose every 2-3 days due to cost of test strips - instructed to keep an eye on her glucose and if it starts to drop with the addition of jardiance, may need to stop glimepiride  4: Anemia- - saw hematology Cathie Hoops) 04/06/21 - hemoglobin 03/27/22 was 11.5   Return in 3 weeks, sooner if needed.

## 2022-05-30 NOTE — Patient Instructions (Addendum)
Bring ALL medications including any over the counter or vitamins to every visit.    Start taking jardiance as 1 tablet every morning

## 2022-06-21 ENCOUNTER — Encounter: Payer: 59 | Admitting: Family

## 2022-07-11 ENCOUNTER — Ambulatory Visit (HOSPITAL_BASED_OUTPATIENT_CLINIC_OR_DEPARTMENT_OTHER): Payer: 59 | Admitting: Family

## 2022-07-11 ENCOUNTER — Other Ambulatory Visit
Admission: RE | Admit: 2022-07-11 | Discharge: 2022-07-11 | Disposition: A | Discharge status: Home / Self Care | Payer: 59 | Source: Ambulatory Visit | Attending: Family | Admitting: Family

## 2022-07-11 ENCOUNTER — Other Ambulatory Visit (HOSPITAL_COMMUNITY): Payer: Self-pay

## 2022-07-11 VITALS — BP 152/76 | HR 93 | Ht 63.0 in | Wt 170.0 lb

## 2022-07-11 DIAGNOSIS — I10 Essential (primary) hypertension: Secondary | ICD-10-CM

## 2022-07-11 DIAGNOSIS — N181 Chronic kidney disease, stage 1: Secondary | ICD-10-CM | POA: Diagnosis not present

## 2022-07-11 DIAGNOSIS — D631 Anemia in chronic kidney disease: Secondary | ICD-10-CM

## 2022-07-11 DIAGNOSIS — E119 Type 2 diabetes mellitus without complications: Secondary | ICD-10-CM | POA: Diagnosis not present

## 2022-07-11 DIAGNOSIS — I5022 Chronic systolic (congestive) heart failure: Secondary | ICD-10-CM | POA: Diagnosis present

## 2022-07-11 LAB — BASIC METABOLIC PANEL
Anion gap: 7 (ref 5–15)
BUN: 14 mg/dL (ref 8–23)
CO2: 25 mmol/L (ref 22–32)
Calcium: 9.6 mg/dL (ref 8.9–10.3)
Chloride: 109 mmol/L (ref 98–111)
Creatinine, Ser: 1.27 mg/dL — ABNORMAL HIGH (ref 0.44–1.00)
GFR, Estimated: 43 mL/min — ABNORMAL LOW (ref >60)
Glucose, Bld: 144 mg/dL — ABNORMAL HIGH (ref 70–99)
Potassium: 4.5 mmol/L (ref 3.5–5.1)
Sodium: 141 mmol/L (ref 135–145)

## 2022-07-11 NOTE — Progress Notes (Signed)
[facility] - Pharmacist Note  Diane Patton is a 77 y.o. female with HFrEF (EF <40%) presenting to the Heart Failure Clinic for follow up. Patient reports no medication access issues. She reports feeling better since starting empagliflozin at last visit. She states that she would like to lose 30 lbs as she believes that this will help her feel better and be more active. She reports no signs or symptoms of volume overload and reports adherence to 64 ox fluid restriction.   Recent ED Visit (past 6 months): none  Guideline-Directed Medical Therapy/Evidence Based Medicine ACE/ARB/ARNI: Sacubitril/valsartan 24/26 mg BID Beta Blocker: Metoprolol succinate 25 mg daily Aldosterone Antagonist:  none Diuretic: Furosemide 20 mg daily SGLT2i: Empagliflozin 10 mg daily  Adherence Assessment Do you ever forget to take your medication? [] Yes [x] No  Do you ever skip doses due to side effects? [] Yes [x] No  Do you have trouble affording your medicines? [] Yes [x] No  Are you ever unable to pick up your medication due to transportation difficulties? [] Yes [x] No  Do you ever stop taking your medications because you don't believe they are helping? [] Yes [x] No  Do you check your weight daily? [x] Yes [] No  Adherence strategy: none reported Barriers to obtaining medications: none reported  Diagnostics ECHO: Date 07/18/2021, EF 35-40%, G1DD  Vitals    07/11/2022   12:48 PM 05/30/2022   10:57 AM 03/16/2022    9:30 AM  Vitals with BMI  Height 5\' 3"     Weight 170 lbs 174 lbs 4 oz   BMI 30.12    Systolic 152 141 846  Diastolic 76 56 60  Pulse 93 71      Recent Labs    Latest Ref Rng & Units 08/11/2021    6:35 AM 08/09/2021    6:46 AM 08/08/2021    6:28 AM  BMP  Glucose 70 - 99 mg/dL 962  952  93   BUN 8 - 23 mg/dL 23  12  12    Creatinine 0.44 - 1.00 mg/dL 8.41  3.24  4.01   Sodium 135 - 145 mmol/L 140  137  139   Potassium 3.5 - 5.1 mmol/L 4.1  4.3  3.9   Chloride 98 - 111 mmol/L  104  104  104   CO2 22 - 32 mmol/L 30  29  28    Calcium 8.9 - 10.3 mg/dL 02.7  9.6  9.6     Past Medical History Past Medical History:  Diagnosis Date   Asthma    Cardiomyopathy, nonischemic (HCC)    CHF (congestive heart failure) (HCC)    COPD (chronic obstructive pulmonary disease) (HCC)    Diabetes mellitus, type 2 (HCC)    Full dentures    Gastric ulcer without hemorrhage or perforation    GERD (gastroesophageal reflux disease)    HOH (hard of hearing)    Right Ear   HTN (hypertension)    Hyperlipidemia    IDA (iron deficiency anemia) 04/05/2020   Obstructive sleep apnea on CPAP    Not using CPAP   Osteoarthritis    osteo of both knees   Scoliosis    Seasonal allergies     Plan Continue regimen as directed by NP Check labs today and titrate Entresto as able per renal function and BP Consider addition of semaglutide for weight management and T2DM ($0 copay) Annual echo due 06/2022  Time spent: 10 minutes  Celene Squibb, PharmD PGY1 Pharmacy Resident 07/11/2022 1:11 PM

## 2022-07-11 NOTE — Patient Instructions (Signed)
Begin weighing daily and call for an overnight weight gain of 3 pounds or more or a weekly weight gain of more than 5 pounds.   When you call the Diane Patton to pick you up, tell them to pick you up in the [facility].

## 2022-07-11 NOTE — Progress Notes (Unsigned)
PCP: Barbette Reichmann, MD (last seen 02/24) Primary Cardiologist: Marcina Millard, MD (last seen 03/24; returns 07/24)   HPI:  Diane Patton is a 77 y/o female with a history of osteoarthritis, obstructive sleep apnea, hyperlipidemia, HTN, GERD, DM, COPD, pulmonary HTN, remote tobacco use and chronic heart failure.  Echo 07/18/21: EF of 35%. Echo 08/17/15: EF of 40% with mild MR, AR and TR. Mild/mod pulmonary HTN. This is a slight improvement from 12/06/13 when her EF was 35-40%.  . Had cardiac catheterization done 05/12/14.  ENT same day surgery 01/24.  She presents today for a HF follow-up visit with a chief complaint of moderate fatigue with minimal exertion. Chronic in nature. Has associated SOB, intermittent chest pain, palpitations, cough, rare dizziness, gradual weight loss.  She was started on jardiance 10mg  at last OV 6 weeks ago.    ROS: All systems negative except as listed in HPI, PMH and Problem List.  SH:  Social History   Socioeconomic History   Marital status: Married    Spouse name: Not on file   Number of children: Not on file   Years of education: Not on file   Highest education level: Not on file  Occupational History   Occupation: retired  Tobacco Use   Smoking status: Former    Packs/day: 1.00    Years: 15.00    Additional pack years: 0.00    Total pack years: 15.00    Types: Cigarettes    Quit date: 07/21/1982    Years since quitting: 40.0   Smokeless tobacco: Never  Vaping Use   Vaping Use: Never used  Substance and Sexual Activity   Alcohol use: No    Alcohol/week: 15.0 standard drinks of alcohol    Types: 15 Standard drinks or equivalent per week    Comment: quit drinking 04/1981   Drug use: No   Sexual activity: Not on file  Other Topics Concern   Not on file  Social History Narrative   Disabled. Regularly exercises.    Social Determinants of Health   Financial Resource Strain: Not on file  Food Insecurity: Not on file  Transportation  Needs: Not on file  Physical Activity: Not on file  Stress: Not on file  Social Connections: Not on file  Intimate Partner Violence: Not on file    FH:  Family History  Problem Relation Age of Onset   Alzheimer's disease Other    Heart attack Mother    Hypertension Mother    Cancer Brother        lung   Cancer Maternal Aunt        mouth and breast   Breast cancer Maternal Aunt    Cancer Daughter        throat   Dementia Father     Past Medical History:  Diagnosis Date   Asthma    Cardiomyopathy, nonischemic (HCC)    CHF (congestive heart failure) (HCC)    COPD (chronic obstructive pulmonary disease) (HCC)    Diabetes mellitus, type 2 (HCC)    Full dentures    Gastric ulcer without hemorrhage or perforation    GERD (gastroesophageal reflux disease)    HOH (hard of hearing)    Right Ear   HTN (hypertension)    Hyperlipidemia    IDA (iron deficiency anemia) 04/05/2020   Obstructive sleep apnea on CPAP    Not using CPAP   Osteoarthritis    osteo of both knees   Scoliosis    Seasonal allergies  Current Outpatient Medications  Medication Sig Dispense Refill   albuterol (PROVENTIL HFA;VENTOLIN HFA) 108 (90 BASE) MCG/ACT inhaler Inhale 2 puffs into the lungs every 6 (six) hours as needed for wheezing or shortness of breath.     ascorbic acid (VITAMIN C) 500 MG tablet Take 500 mg by mouth daily.     aspirin 81 MG chewable tablet Chew by mouth daily.     benzonatate (TESSALON) 200 MG capsule Take 1 capsule by mouth 3 (three) times daily as needed.     busPIRone (BUSPAR) 5 MG tablet Take 1 tablet by mouth 3 (three) times daily.     calcium carbonate (OS-CAL) 600 MG TABS tablet Take 600 mg by mouth 2 (two) times daily with a meal.      cetirizine (ZYRTEC) 10 MG tablet Take 10 mg by mouth daily as needed.     cholecalciferol (VITAMIN D) 400 units TABS tablet Take 1,000 Units by mouth daily.     ciprofloxacin-dexamethasone (CIPRODEX) OTIC suspension Place 4 drops into  both ears 2 (two) times daily for 7 days. DOS 03/16/22 7.5 mL 0   Cod Liver Oil w/Vit A & D CAPS Take 1 capsule by mouth daily.     empagliflozin (JARDIANCE) 10 MG TABS tablet Take 1 tablet (10 mg total) by mouth daily before breakfast. 30 tablet 5   feeding supplement (ENSURE ENLIVE / ENSURE PLUS) LIQD Take 237 mLs by mouth 3 (three) times daily between meals. 21330 mL 0   ferrous gluconate (FERGON) 324 MG tablet Take 324 mg by mouth daily with breakfast.     furosemide (LASIX) 40 MG tablet Take 0.5 tablets (20 mg total) by mouth daily. 30 tablet 0   glimepiride (AMARYL) 2 MG tablet Take 2 mg by mouth daily with breakfast.     hydrALAZINE (APRESOLINE) 25 MG tablet Take 1 tablet by mouth 2 (two) times daily.     isosorbide mononitrate (IMDUR) 30 MG 24 hr tablet Take 1 tablet by mouth daily.     levocetirizine (XYZAL) 5 MG tablet Take by mouth every evening.     metFORMIN (GLUCOPHAGE) 500 MG tablet Take 1 tablet by mouth 2 (two) times daily with a meal.     metoprolol succinate (TOPROL-XL) 25 MG 24 hr tablet Take 1 tablet (25 mg total) by mouth daily. 30 tablet 0   montelukast (SINGULAIR) 10 MG tablet Take 1 tablet by mouth daily.     Multiple Vitamin (MULTIVITAMIN) tablet Take 1 tablet by mouth daily.      pantoprazole (PROTONIX) 40 MG tablet Take 40 mg by mouth 2 (two) times daily.     polyethylene glycol (MIRALAX / GLYCOLAX) 17 g packet Take 17 g by mouth daily as needed for severe constipation. 14 each 0   rosuvastatin (CRESTOR) 20 MG tablet Take 1 tablet by mouth daily.     sacubitril-valsartan (ENTRESTO) 24-26 MG Take 1 tablet by mouth 2 (two) times daily. 60 tablet 0   sucralfate (CARAFATE) 1 g tablet Take 1 tablet by mouth 2 (two) times daily before a meal.     traMADol-acetaminophen (ULTRACET) 37.5-325 MG tablet Take 1 tablet by mouth every 8 (eight) hours as needed.     traZODone (DESYREL) 100 MG tablet Take 1 tablet by mouth at bedtime.     No current facility-administered medications  for this visit.      PHYSICAL EXAM:  General:  Well appearing. No resp difficulty HEENT: normal Neck: supple. JVP flat. Carotids 2+ bilaterally; no bruits. No  lymphadenopathy or thryomegaly appreciated. Cor: PMI normal. Regular rate & rhythm. No rubs, gallops or murmurs. Lungs: clear Abdomen: soft, nontender, nondistended. No hepatosplenomegaly. No bruits or masses. Good bowel sounds. Extremities: no cyanosis, clubbing, rash, edema Neuro: alert & orientedx3, cranial nerves grossly intact. Moves all 4 extremities w/o difficulty. Affect pleasant.   ECG:   ASSESSMENT & PLAN:  1: NICM with reduced ejection fraction- - likely due to HTN - NYHA class III - euvolemic - weighing daily; reminded to call for an overnight weight gain of > 2 pounds or a weekly weight gain of > 5 pounds - weight 174.4 from last visit here 6 weeks ago - Echo 07/18/21: EF of 35%.  - Echo 08/17/15: EF of 40% with mild MR, AR and TR. Mild/mod pulmonary HTN. This is a slight improvement from 12/06/13 when her EF was 35-40%.  - cardiac catheterization done 05/12/14. - not adding any salt to her food - drinking around 40-60 ounces of fluid daily - continue metoprolol succinate 25mg  daily - continue entresto 24/26mg  BID - continue furosemide 20mg  daily - continue jardiance 10mg  daily - BMP today - saw cardiology Mellissa Kohut) 03/24; returns 07/24 - BNP 07/30/21 was 486.9 - PharmD reconciled meds w/ patient  2: HTN- - BP  - saw her PCP (Hande) 02/24; returns 08/24 - BMP from 03/27/22 reviewed and shows sodium 139, potassium 4.5, creatinine 1.13 and GFR 50 - saw nephrology Suezanne Jacquet) 07/13/21  3: Diabetes- - A1c 03/27/22 was 7.0% - checks glucose every 2-3 days due to cost of test strips - instructed to keep an eye on her glucose and if it starts to drop with the addition of jardiance, may need to stop glimepiride  4: Anemia- - saw hematology Cathie Hoops) 04/06/21 - hemoglobin 03/27/22 was 11.5

## 2022-07-12 ENCOUNTER — Encounter: Payer: Self-pay | Admitting: Family

## 2022-07-12 ENCOUNTER — Telehealth: Payer: Self-pay

## 2022-07-12 ENCOUNTER — Other Ambulatory Visit: Payer: Self-pay

## 2022-07-12 MED ORDER — ENTRESTO 49-51 MG PO TABS: 1.0000 | ORAL_TABLET | Freq: Two times a day (BID) | ORAL | 5 refills | Status: DC

## 2022-07-12 NOTE — Telephone Encounter (Signed)
-----   Message from Delma Freeze, [place] sent at 07/12/2022  1:07 PM EDT ----- Sherryll Burger will be increased to 49/51mg  BID, not 97/103mg , sorry. Will need RX for 49/51mg  BID send in

## 2022-07-13 NOTE — Telephone Encounter (Signed)
Spoke with patient this morning patient verbalized understanding and will pick up medication from pharmacy today.

## 2022-08-16 ENCOUNTER — Ambulatory Visit (HOSPITAL_BASED_OUTPATIENT_CLINIC_OR_DEPARTMENT_OTHER): Payer: 59 | Admitting: Family

## 2022-08-16 ENCOUNTER — Encounter: Payer: Self-pay | Admitting: Family

## 2022-08-16 ENCOUNTER — Other Ambulatory Visit
Admission: RE | Admit: 2022-08-16 | Discharge: 2022-08-16 | Disposition: A | Discharge status: Home / Self Care | Payer: 59 | Source: Ambulatory Visit | Attending: Family | Admitting: Family

## 2022-08-16 VITALS — BP 116/60 | HR 96 | Ht 63.0 in | Wt 168.0 lb

## 2022-08-16 DIAGNOSIS — I10 Essential (primary) hypertension: Secondary | ICD-10-CM

## 2022-08-16 DIAGNOSIS — I5022 Chronic systolic (congestive) heart failure: Secondary | ICD-10-CM | POA: Insufficient documentation

## 2022-08-16 DIAGNOSIS — D631 Anemia in chronic kidney disease: Secondary | ICD-10-CM

## 2022-08-16 DIAGNOSIS — E119 Type 2 diabetes mellitus without complications: Secondary | ICD-10-CM | POA: Diagnosis not present

## 2022-08-16 DIAGNOSIS — N181 Chronic kidney disease, stage 1: Secondary | ICD-10-CM

## 2022-08-16 LAB — BASIC METABOLIC PANEL
Anion gap: 13 (ref 5–15)
BUN: 22 mg/dL (ref 8–23)
CO2: 22 mmol/L (ref 22–32)
Calcium: 10.5 mg/dL — ABNORMAL HIGH (ref 8.9–10.3)
Chloride: 106 mmol/L (ref 98–111)
Creatinine, Ser: 1.5 mg/dL — ABNORMAL HIGH (ref 0.44–1.00)
GFR, Estimated: 35 mL/min — ABNORMAL LOW (ref >60)
Glucose, Bld: 159 mg/dL — ABNORMAL HIGH (ref 70–99)
Potassium: 4.2 mmol/L (ref 3.5–5.1)
Sodium: 141 mmol/L (ref 135–145)

## 2022-08-16 MED ORDER — ENTRESTO 49-51 MG PO TABS: 1.0000 | ORAL_TABLET | Freq: Two times a day (BID) | ORAL | 1 refills | Status: DC

## 2022-08-16 NOTE — Progress Notes (Signed)
PCP: Barbette Reichmann, MD (last seen 02/24) Primary Cardiologist: Marcina Millard, MD (last seen 03/24; returns 07/24)   HPI:  Diane Patton is a 77 y/o female with a history of osteoarthritis, obstructive sleep apnea, hyperlipidemia, HTN, GERD, DM, COPD, pulmonary HTN, remote tobacco use and chronic heart failure.  Echo 07/18/21: EF of 35%. Echo 08/17/15: EF of 40% with mild MR, AR and TR. Mild/mod pulmonary HTN. This is a slight improvement from 12/06/13 when her EF was 35-40%.  . Had cardiac catheterization done 05/12/14.  ENT same day surgery 01/24.  She presents today for a HF follow-up visit with a chief complaint of moderate fatigue with minimal exertion. Chronic in nature. Has associated intermittent chest pain, SOB, slight swelling in right ankle and some difficulty sleeping at night. Denies palpitations, abdominal distention or weight gain.   At last visit her entresto was increased to 49/51mg  BID. She denies any difficulty with this. Using a walker to ambulate.   ROS: All systems negative except as listed in HPI, PMH and Problem List.  SH:  Social History   Socioeconomic History   Marital status: Married    Spouse name: Not on file   Number of children: Not on file   Years of education: Not on file   Highest education level: Not on file  Occupational History   Occupation: retired  Tobacco Use   Smoking status: Former    Packs/day: 1.00    Years: 15.00    Additional pack years: 0.00    Total pack years: 15.00    Types: Cigarettes    Quit date: 07/21/1982    Years since quitting: 40.0   Smokeless tobacco: Never  Vaping Use   Vaping Use: Never used  Substance and Sexual Activity   Alcohol use: No    Alcohol/week: 15.0 standard drinks of alcohol    Types: 15 Standard drinks or equivalent per week    Comment: quit drinking 04/1981   Drug use: No   Sexual activity: Not on file  Other Topics Concern   Not on file  Social History Narrative   Disabled. Regularly  exercises.    Social Determinants of Health   Financial Resource Strain: Not on file  Food Insecurity: Not on file  Transportation Needs: Not on file  Physical Activity: Not on file  Stress: Not on file  Social Connections: Not on file  Intimate Partner Violence: Not on file    FH:  Family History  Problem Relation Age of Onset   Alzheimer's disease Other    Heart attack Mother    Hypertension Mother    Cancer Brother        lung   Cancer Maternal Aunt        mouth and breast   Breast cancer Maternal Aunt    Cancer Daughter        throat   Dementia Father     Past Medical History:  Diagnosis Date   Asthma    Cardiomyopathy, nonischemic (HCC)    CHF (congestive heart failure) (HCC)    COPD (chronic obstructive pulmonary disease) (HCC)    Diabetes mellitus, type 2 (HCC)    Full dentures    Gastric ulcer without hemorrhage or perforation    GERD (gastroesophageal reflux disease)    HOH (hard of hearing)    Right Ear   HTN (hypertension)    Hyperlipidemia    IDA (iron deficiency anemia) 04/05/2020   Obstructive sleep apnea on CPAP    Not using  CPAP   Osteoarthritis    osteo of both knees   Scoliosis    Seasonal allergies     Current Outpatient Medications  Medication Sig Dispense Refill   albuterol (PROVENTIL HFA;VENTOLIN HFA) 108 (90 BASE) MCG/ACT inhaler Inhale 2 puffs into the lungs every 6 (six) hours as needed for wheezing or shortness of breath.     ascorbic acid (VITAMIN C) 500 MG tablet Take 500 mg by mouth daily.     aspirin 81 MG chewable tablet Chew by mouth daily.     benzonatate (TESSALON) 200 MG capsule Take 1 capsule by mouth 3 (three) times daily as needed.     busPIRone (BUSPAR) 5 MG tablet Take 1 tablet by mouth 3 (three) times daily. (Patient not taking: Reported on 07/11/2022)     calcium carbonate (OS-CAL) 600 MG TABS tablet Take 600 mg by mouth 2 (two) times daily with a meal.      cetirizine (ZYRTEC) 10 MG tablet Take 10 mg by mouth daily  as needed.     cholecalciferol (VITAMIN D) 400 units TABS tablet Take 1,000 Units by mouth daily.     Cod Liver Oil w/Vit A & D CAPS Take 1 capsule by mouth daily.     empagliflozin (JARDIANCE) 10 MG TABS tablet Take 1 tablet (10 mg total) by mouth daily before breakfast. 30 tablet 5   feeding supplement (ENSURE ENLIVE / ENSURE PLUS) LIQD Take 237 mLs by mouth 3 (three) times daily between meals. 21330 mL 0   ferrous gluconate (FERGON) 324 MG tablet Take 324 mg by mouth daily with breakfast.     furosemide (LASIX) 40 MG tablet Take 0.5 tablets (20 mg total) by mouth daily. 30 tablet 0   glimepiride (AMARYL) 2 MG tablet Take 2 mg by mouth daily with breakfast.     hydrALAZINE (APRESOLINE) 25 MG tablet Take 1 tablet by mouth 2 (two) times daily.     isosorbide mononitrate (IMDUR) 30 MG 24 hr tablet Take 1 tablet by mouth daily.     levocetirizine (XYZAL) 5 MG tablet Take by mouth every evening.     metFORMIN (GLUCOPHAGE) 500 MG tablet Take 1 tablet by mouth 2 (two) times daily with a meal.     metoprolol succinate (TOPROL-XL) 25 MG 24 hr tablet Take 1 tablet (25 mg total) by mouth daily. 30 tablet 0   montelukast (SINGULAIR) 10 MG tablet Take 1 tablet by mouth daily.     Multiple Vitamin (MULTIVITAMIN) tablet Take 1 tablet by mouth daily.      pantoprazole (PROTONIX) 40 MG tablet Take 40 mg by mouth 2 (two) times daily.     polyethylene glycol (MIRALAX / GLYCOLAX) 17 g packet Take 17 g by mouth daily as needed for severe constipation. 14 each 0   rosuvastatin (CRESTOR) 20 MG tablet Take 1 tablet by mouth daily.     sacubitril-valsartan (ENTRESTO) 24-26 MG Take 1 tablet by mouth 2 (two) times daily. 60 tablet 0   sacubitril-valsartan (ENTRESTO) 49-51 MG Take 1 tablet by mouth 2 (two) times daily. 60 tablet 5   sucralfate (CARAFATE) 1 g tablet Take 1 tablet by mouth 2 (two) times daily before a meal.     traMADol-acetaminophen (ULTRACET) 37.5-325 MG tablet Take 1 tablet by mouth every 8 (eight)  hours as needed.     traZODone (DESYREL) 100 MG tablet Take 0.5 tablets by mouth at bedtime.     No current facility-administered medications for this visit.   Vitals:  08/16/22 1336  BP: 116/60  Pulse: 96  SpO2: 100%  Weight: 168 lb (76.2 kg)  Height: 5\' 3"  (1.6 m)   Wt Readings from Last 3 Encounters:  08/16/22 168 lb (76.2 kg)  07/11/22 170 lb (77.1 kg)  05/30/22 174 lb 4 oz (79 kg)   Lab Results  Component Value Date   CREATININE 1.27 (H) 07/11/2022   CREATININE 1.13 (H) 08/11/2021   CREATININE 1.09 (H) 08/09/2021   PHYSICAL EXAM:  General:  Well appearing. No resp difficulty HEENT: HOH Neck: supple. JVP flat. No lymphadenopathy or thryomegaly appreciated. Cor: PMI normal. Regular rate & rhythm. No rubs, gallops or murmurs. Lungs: clear Abdomen: soft, nontender, nondistended. No hepatosplenomegaly. No bruits or masses.  Extremities: no cyanosis, clubbing, rash, trace edema around right ankle Neuro: alert & orientedx3, cranial nerves grossly intact. Moves all 4 extremities w/o difficulty. Affect pleasant.  ECG: not done  ASSESSMENT & PLAN:  1: NICM with reduced ejection fraction- - likely due to HTN - NYHA class III - euvolemic - encouraged to resume weighing daily; reminded to call for an overnight weight gain of > 2 pounds or a weekly weight gain of > 5 pounds - weight down 2 pounds from last visit here 1 month ago - Echo 07/18/21: EF of 35%.  - Echo 08/17/15: EF of 40% with mild MR, AR and TR. Mild/mod pulmonary HTN. This is a slight improvement from 12/06/13 when her EF was 35-40%.  - echo has been scheduled for 09/04/22 - cardiac catheterization done 05/12/14. - not adding any salt to her food - drinking around 40-60 ounces of fluid daily - continue metoprolol succinate 25mg  daily - continue entresto 49/51mg  BID - continue furosemide 20mg  daily - continue jardiance 10mg  daily - consider adding 12.5mg  spironolactone at next visit if BP allows - BMP today   - saw cardiology Mellissa Kohut) 03/24; returns 07/24 - BNP 07/30/21 was 486.9  2: HTN- - BP 116/60 - saw PCP (Hande) 02/24; returns 08/24 - BMP from 07/11/22 reviewed and shows sodium 141, potassium 4.5, creatinine 1.27 and GFR 43 - saw nephrology (Korrapati) 05/23  3: Diabetes- - A1c 03/27/22 was 7.0% - checks glucose every 2-3 days due to cost of test strips  4: Anemia- - saw hematology Cathie Hoops) 02/23 - hemoglobin 03/27/22 was 11.5  Return in 1 month, sooner if needed.

## 2022-08-16 NOTE — Patient Instructions (Signed)
[facility] to get [facility] work drawn.  

## 2022-08-28 ENCOUNTER — Telehealth: Payer: Self-pay

## 2022-08-28 ENCOUNTER — Encounter: Payer: Self-pay | Admitting: Oncology

## 2022-08-30 NOTE — Telephone Encounter (Signed)
Pt called stating she's like to speak w/ tina because she has had foot swelling since sunday. (pt states she did NOT take her diuretic Sunday- usually take half a tab) ; Also has left hand soreness that is raidating up her arm. Please advise.   She can take a whole furosemide for 2 days. Watch sodium and fluid intake carefully and let us know if this doesn't improve.   Called and spoke w/ pt about Tina's advise. Pt had good understanding and on further questions.

## 2022-09-04 ENCOUNTER — Ambulatory Visit
Admission: RE | Admit: 2022-09-04 | Discharge: 2022-09-04 | Disposition: A | Discharge status: Home / Self Care | Payer: 59 | Source: Ambulatory Visit | Attending: Family | Admitting: Family

## 2022-09-04 DIAGNOSIS — I509 Heart failure, unspecified: Secondary | ICD-10-CM | POA: Insufficient documentation

## 2022-09-04 DIAGNOSIS — E1122 Type 2 diabetes mellitus with diabetic chronic kidney disease: Secondary | ICD-10-CM | POA: Insufficient documentation

## 2022-09-04 DIAGNOSIS — I340 Nonrheumatic mitral (valve) insufficiency: Secondary | ICD-10-CM | POA: Diagnosis not present

## 2022-09-04 DIAGNOSIS — E785 Hyperlipidemia, unspecified: Secondary | ICD-10-CM | POA: Diagnosis not present

## 2022-09-04 DIAGNOSIS — I5022 Chronic systolic (congestive) heart failure: Secondary | ICD-10-CM

## 2022-09-04 DIAGNOSIS — N189 Chronic kidney disease, unspecified: Secondary | ICD-10-CM | POA: Insufficient documentation

## 2022-09-04 DIAGNOSIS — G4730 Sleep apnea, unspecified: Secondary | ICD-10-CM | POA: Insufficient documentation

## 2022-09-04 DIAGNOSIS — I130 Hypertensive heart and chronic kidney disease with heart failure and stage 1 through stage 4 chronic kidney disease, or unspecified chronic kidney disease: Secondary | ICD-10-CM | POA: Diagnosis not present

## 2022-09-04 DIAGNOSIS — J449 Chronic obstructive pulmonary disease, unspecified: Secondary | ICD-10-CM | POA: Insufficient documentation

## 2022-09-04 DIAGNOSIS — I371 Nonrheumatic pulmonary valve insufficiency: Secondary | ICD-10-CM | POA: Diagnosis not present

## 2022-09-04 LAB — ECHOCARDIOGRAM COMPLETE
AR max vel: 2.37 cm2
AV Area VTI: 2.17 cm2
AV Area mean vel: 2.18 cm2
AV Mean grad: 6 mmHg
AV Peak grad: 11.6 mmHg
Ao pk vel: 1.7 m/s
Area-P 1/2: 3.3 cm2
MV VTI: 2.44 cm2
S' Lateral: 3.1 cm
Single Plane A4C EF: 45.8 %

## 2022-09-04 NOTE — Progress Notes (Signed)
*  PRELIMINARY RESULTS* Echocardiogram 2D Echocardiogram has been performed.  Carolyne Fiscal 09/04/2022, 12:18 PM

## 2022-09-11 ENCOUNTER — Encounter: Payer: Self-pay | Admitting: Family

## 2022-09-11 ENCOUNTER — Other Ambulatory Visit
Admission: RE | Admit: 2022-09-11 | Discharge: 2022-09-11 | Disposition: A | Discharge status: Home / Self Care | Payer: 59 | Source: Ambulatory Visit | Attending: Family | Admitting: Family

## 2022-09-11 ENCOUNTER — Ambulatory Visit (HOSPITAL_BASED_OUTPATIENT_CLINIC_OR_DEPARTMENT_OTHER): Payer: 59 | Admitting: Family

## 2022-09-11 VITALS — BP 112/52 | HR 84 | Resp 14 | Wt 165.2 lb

## 2022-09-11 DIAGNOSIS — I5022 Chronic systolic (congestive) heart failure: Secondary | ICD-10-CM | POA: Diagnosis present

## 2022-09-11 DIAGNOSIS — I10 Essential (primary) hypertension: Secondary | ICD-10-CM

## 2022-09-11 DIAGNOSIS — D631 Anemia in chronic kidney disease: Secondary | ICD-10-CM

## 2022-09-11 DIAGNOSIS — E119 Type 2 diabetes mellitus without complications: Secondary | ICD-10-CM

## 2022-09-11 DIAGNOSIS — N181 Chronic kidney disease, stage 1: Secondary | ICD-10-CM

## 2022-09-11 LAB — BASIC METABOLIC PANEL
Anion gap: 8 (ref 5–15)
BUN: 13 mg/dL (ref 8–23)
CO2: 24 mmol/L (ref 22–32)
Calcium: 9.4 mg/dL (ref 8.9–10.3)
Chloride: 108 mmol/L (ref 98–111)
Creatinine, Ser: 1.3 mg/dL — ABNORMAL HIGH (ref 0.44–1.00)
GFR, Estimated: 42 mL/min — ABNORMAL LOW (ref >60)
Glucose, Bld: 120 mg/dL — ABNORMAL HIGH (ref 70–99)
Potassium: 3.8 mmol/L (ref 3.5–5.1)
Sodium: 140 mmol/L (ref 135–145)

## 2022-09-11 MED ORDER — EMPAGLIFLOZIN 10 MG PO TABS: 10.0000 mg | ORAL_TABLET | Freq: Every day | ORAL | 3 refills | Status: DC

## 2022-09-11 NOTE — Progress Notes (Signed)
PCP: Barbette Reichmann, MD (last seen 02/24) Primary Cardiologist: Dorothyann Peng, MD (last seen 07/24)   HPI:  Diane Patton is a 77 y/o female with a history of osteoarthritis, obstructive sleep apnea, hyperlipidemia, HTN, GERD, DM, COPD, pulmonary HTN, meningitis 2023, remote tobacco use and chronic heart failure.  Echo 08/17/15: EF of 40% with mild MR, AR and TR. Mild/mod pulmonary HTN. This is a slight improvement from 12/06/13 when her EF was 35-40%.  Echo 07/18/21: EF of 35%. Echo 09/04/22: EF 40-45% along with Grade I DD, mildly elevated PA pressure and mild/ moderate MR  Had cardiac catheterization done 05/12/14.  ENT same day surgery 01/24.  She presents today for a HF follow-up visit with a chief complaint of minimal SOB with moderate exertion. Chronic in nature. Has associated fatigue, intermittent chest pain and chronic right ankle swelling at times (previously broke that ankle). Denies palpitations, cough, abdominal distention, dizziness, weight gain or difficulty sleeping.   Had her echo done and she is here to get those results.   ROS: All systems negative except as listed in HPI, PMH and Problem List.  SH:  Social History   Socioeconomic History   Marital status: Married    Spouse name: Not on file   Number of children: Not on file   Years of education: Not on file   Highest education level: Not on file  Occupational History   Occupation: retired  Tobacco Use   Smoking status: Former    Current packs/day: 0.00    Average packs/day: 1 pack/day for 15.0 years (15.0 ttl pk-yrs)    Types: Cigarettes    Start date: 07/21/1967    Quit date: 07/21/1982    Years since quitting: 40.1   Smokeless tobacco: Never  Vaping Use   Vaping status: Never Used  Substance and Sexual Activity   Alcohol use: No    Alcohol/week: 15.0 standard drinks of alcohol    Types: 15 Standard drinks or equivalent per week    Comment: quit drinking 04/1981   Drug use: No   Sexual activity: Not on  file  Other Topics Concern   Not on file  Social History Narrative   Disabled. Regularly exercises.    Social Determinants of Health   Financial Resource Strain: Not on file  Food Insecurity: Not on file  Transportation Needs: Not on file  Physical Activity: Not on file  Stress: Not on file  Social Connections: Not on file  Intimate Partner Violence: Not on file    FH:  Family History  Problem Relation Age of Onset   Alzheimer's disease Other    Heart attack Mother    Hypertension Mother    Cancer Brother        lung   Cancer Maternal Aunt        mouth and breast   Breast cancer Maternal Aunt    Cancer Daughter        throat   Dementia Father     Past Medical History:  Diagnosis Date   Asthma    Cardiomyopathy, nonischemic (HCC)    CHF (congestive heart failure) (HCC)    COPD (chronic obstructive pulmonary disease) (HCC)    Diabetes mellitus, type 2 (HCC)    Full dentures    Gastric ulcer without hemorrhage or perforation    GERD (gastroesophageal reflux disease)    HOH (hard of hearing)    Right Ear   HTN (hypertension)    Hyperlipidemia    IDA (iron deficiency anemia)  04/05/2020   Obstructive sleep apnea on CPAP    Not using CPAP   Osteoarthritis    osteo of both knees   Scoliosis    Seasonal allergies     Current Outpatient Medications  Medication Sig Dispense Refill   albuterol (PROVENTIL HFA;VENTOLIN HFA) 108 (90 BASE) MCG/ACT inhaler Inhale 2 puffs into the lungs every 6 (six) hours as needed for wheezing or shortness of breath.     ascorbic acid (VITAMIN C) 500 MG tablet Take 500 mg by mouth daily.     aspirin 81 MG chewable tablet Chew by mouth daily.     benzonatate (TESSALON) 200 MG capsule Take 1 capsule by mouth 3 (three) times daily as needed. (Patient not taking: Reported on 08/16/2022)     busPIRone (BUSPAR) 5 MG tablet Take 1 tablet by mouth 3 (three) times daily. (Patient not taking: Reported on 08/16/2022)     calcium carbonate (OS-CAL)  600 MG TABS tablet Take 600 mg by mouth 2 (two) times daily with a meal.      cetirizine (ZYRTEC) 10 MG tablet Take 10 mg by mouth daily as needed.     cholecalciferol (VITAMIN D) 400 units TABS tablet Take 1,000 Units by mouth daily.     ciprofloxacin (CIPRO) 500 MG tablet Take 500 mg by mouth 2 (two) times daily.     ciprofloxacin-dexamethasone (CIPRODEX) OTIC suspension Place 4 drops into the left ear 2 (two) times daily.     Cod Liver Oil w/Vit A & D CAPS Take 1 capsule by mouth daily.     empagliflozin (JARDIANCE) 10 MG TABS tablet Take 1 tablet (10 mg total) by mouth daily before breakfast. 30 tablet 5   feeding supplement (ENSURE ENLIVE / ENSURE PLUS) LIQD Take 237 mLs by mouth 3 (three) times daily between meals. 21330 mL 0   ferrous gluconate (FERGON) 324 MG tablet Take 324 mg by mouth daily with breakfast.     furosemide (LASIX) 40 MG tablet Take 0.5 tablets (20 mg total) by mouth daily. 30 tablet 0   glimepiride (AMARYL) 2 MG tablet Take 2 mg by mouth daily with breakfast.     hydrALAZINE (APRESOLINE) 25 MG tablet Take 1 tablet by mouth 2 (two) times daily.     isosorbide mononitrate (IMDUR) 30 MG 24 hr tablet Take 1 tablet by mouth daily.     levocetirizine (XYZAL) 5 MG tablet Take by mouth every evening.     metFORMIN (GLUCOPHAGE) 500 MG tablet Take 1 tablet by mouth 2 (two) times daily with a meal.     metoprolol succinate (TOPROL-XL) 25 MG 24 hr tablet Take 1 tablet (25 mg total) by mouth daily. 30 tablet 0   montelukast (SINGULAIR) 10 MG tablet Take 1 tablet by mouth daily.     Multiple Vitamin (MULTIVITAMIN) tablet Take 1 tablet by mouth daily.      pantoprazole (PROTONIX) 40 MG tablet Take 40 mg by mouth 2 (two) times daily.     polyethylene glycol (MIRALAX / GLYCOLAX) 17 g packet Take 17 g by mouth daily as needed for severe constipation. 14 each 0   rosuvastatin (CRESTOR) 20 MG tablet Take 1 tablet by mouth daily.     sacubitril-valsartan (ENTRESTO) 24-26 MG Take 1 tablet by  mouth 2 (two) times daily. 60 tablet 0   sacubitril-valsartan (ENTRESTO) 49-51 MG Take 1 tablet by mouth 2 (two) times daily. 180 tablet 1   sucralfate (CARAFATE) 1 g tablet Take 1 tablet by mouth 2 (two)  times daily before a meal.     traMADol-acetaminophen (ULTRACET) 37.5-325 MG tablet Take 1 tablet by mouth every 8 (eight) hours as needed.     traZODone (DESYREL) 100 MG tablet Take 0.5 tablets by mouth at bedtime.     No current facility-administered medications for this visit.   Vitals:   09/11/22 1413  BP: (!) 112/52  Pulse: 84  Resp: 14  SpO2: 100%  Weight: 165 lb 4 oz (75 kg)   Wt Readings from Last 3 Encounters:  09/11/22 165 lb 4 oz (75 kg)  08/16/22 168 lb (76.2 kg)  07/11/22 170 lb (77.1 kg)   Lab Results  Component Value Date   CREATININE 1.50 (H) 08/16/2022   CREATININE 1.27 (H) 07/11/2022   CREATININE 1.13 (H) 08/11/2021   PHYSICAL EXAM:  General:  Well appearing. No resp difficulty HEENT: normal Neck: supple. JVP flat. No lymphadenopathy or thryomegaly appreciated. Cor: PMI normal. Regular rate & rhythm. No rubs, gallops or murmurs. Lungs: clear Abdomen: soft, nontender, nondistended. No hepatosplenomegaly. No bruits or masses.  Extremities: no cyanosis, clubbing, rash, edema Neuro: alert & oriented x3, cranial nerves grossly intact. Moves all 4 extremities w/o difficulty. Affect pleasant.   ECG: not done   ASSESSMENT & PLAN:  1: NICM with reduced ejection fraction (although improving)- - likely due to HTN - NYHA class III - euvolemic - encouraged to resume weighing daily; reminded to call for an overnight weight gain of > 2 pounds or a weekly weight gain of > 5 pounds - weight down 3 pounds from last visit here 1 month ago - Echo 08/17/15: EF of 40% with mild MR, AR and TR. Mild/mod pulmonary HTN. This is a slight improvement from 12/06/13 when her EF was 35-40%.  - Echo 07/18/21: EF of 35%.  - Echo 09/04/22: EF 40-45% along with Grade I DD, mildly  elevated PA pressure and mild/ moderate MR - cardiac catheterization done 05/12/14 - not adding any salt to her food - drinking around 40-60 ounces of fluid daily - continue metoprolol succinate 25mg  daily - continue entresto 24/26mg  BID - continue furosemide 20mg  daily - continue jardiance 10mg  daily - BMP today; depending on kidney function, discussed adding spironolactone 12.5mg  daily and using furosemide PRN; will call patient once we get results back - saw cardiology Mellissa Kohut) 07/24 - BNP 07/30/21 was 486.9  2: HTN- - BP 112/52 - saw her PCP (Hande) 02/24; returns 08/24 - BMP from 08/16/22 reviewed and shows sodium 141, potassium 4.2, creatinine 1.5 and GFR 35 - BMP today - saw nephrology Suezanne Jacquet) 05/23  3: Diabetes- - A1c 03/27/22 was 7.0% - checks glucose every 2-3 days due to cost of test strips  4: Anemia- - saw hematology Cathie Hoops) 02/23 - hemoglobin 03/27/22 was 11.5  Return in 1 month, sooner if needed.

## 2022-09-12 ENCOUNTER — Telehealth: Payer: Self-pay

## 2022-09-12 DIAGNOSIS — I5022 Chronic systolic (congestive) heart failure: Secondary | ICD-10-CM

## 2022-09-12 MED ORDER — SPIRONOLACTONE 25 MG PO TABS: 12.5000 mg | ORAL_TABLET | Freq: Every day | ORAL | 3 refills | Status: DC

## 2022-09-12 NOTE — Telephone Encounter (Signed)
-----   Message from Delma Freeze sent at 09/12/2022  8:15 AM EDT ----- Potassium is normal and kidney function is improving. Begin spironolactone 12.5mg  daily. When you start this, only take the furosemide as needed for weight gain, swelling or SOB. BMP in 7-10 days.

## 2022-10-17 ENCOUNTER — Ambulatory Visit: Payer: 59 | Attending: Family | Admitting: Family

## 2022-10-17 ENCOUNTER — Encounter: Payer: Self-pay | Admitting: Family

## 2022-10-17 VITALS — BP 124/57 | HR 84 | Wt 164.0 lb

## 2022-10-17 DIAGNOSIS — I428 Other cardiomyopathies: Secondary | ICD-10-CM | POA: Insufficient documentation

## 2022-10-17 DIAGNOSIS — D631 Anemia in chronic kidney disease: Secondary | ICD-10-CM

## 2022-10-17 DIAGNOSIS — N1831 Chronic kidney disease, stage 3a: Secondary | ICD-10-CM

## 2022-10-17 DIAGNOSIS — E119 Type 2 diabetes mellitus without complications: Secondary | ICD-10-CM

## 2022-10-17 DIAGNOSIS — I110 Hypertensive heart disease with heart failure: Secondary | ICD-10-CM | POA: Diagnosis not present

## 2022-10-17 DIAGNOSIS — I10 Essential (primary) hypertension: Secondary | ICD-10-CM | POA: Diagnosis not present

## 2022-10-17 DIAGNOSIS — D649 Anemia, unspecified: Secondary | ICD-10-CM | POA: Insufficient documentation

## 2022-10-17 DIAGNOSIS — I5022 Chronic systolic (congestive) heart failure: Secondary | ICD-10-CM | POA: Diagnosis not present

## 2022-10-17 NOTE — Progress Notes (Signed)
PCP: Barbette Reichmann, MD (last seen 08/24) Primary Cardiologist: Dorothyann Peng, MD (last seen 07/24)   HPI:  Diane Patton is a 77 y/o female with a history of osteoarthritis, obstructive sleep apnea, hyperlipidemia, HTN, GERD, DM, COPD, pulmonary HTN, meningitis 2023, remote tobacco use and chronic heart failure.  ENT same day surgery 01/24.  Echo 08/17/15: EF of 40% with mild MR, AR and TR. Mild/mod pulmonary HTN. This is a slight improvement from 12/06/13 when her EF was 35-40%.  Echo 07/18/21: EF of 35%. Echo 09/04/22: EF 40-45% along with Grade I DD, mildly elevated PA pressure and mild/ moderate MR  Had cardiac catheterization done 05/12/14.  She presents today for a HF follow-up visit with a chief complaint of moderate fatigue with minimal exertion. Chronic in nature. Has associated back pain with exertion and occasional dizziness along with this, Denies SOB, chest pain, cough, palpitations, abdominal distention, weight gain or difficulty sleeping.   Since last visit, she has started spironolactone 12.5mg  daily & furosemide was changed to PRN. She says that she hasn't had to take any of the furosemide since last here.   ROS: All systems negative except as listed in HPI, PMH and Problem List.  SH:  Social History   Socioeconomic History   Marital status: Married    Spouse name: Not on file   Number of children: Not on file   Years of education: Not on file   Highest education level: Not on file  Occupational History   Occupation: retired  Tobacco Use   Smoking status: Former    Current packs/day: 0.00    Average packs/day: 1 pack/day for 15.0 years (15.0 ttl pk-yrs)    Types: Cigarettes    Start date: 07/21/1967    Quit date: 07/21/1982    Years since quitting: 40.2   Smokeless tobacco: Never  Vaping Use   Vaping status: Never Used  Substance and Sexual Activity   Alcohol use: No    Alcohol/week: 15.0 standard drinks of alcohol    Types: 15 Standard drinks or equivalent  per week    Comment: quit drinking 04/1981   Drug use: No   Sexual activity: Not on file  Other Topics Concern   Not on file  Social History Narrative   Disabled. Regularly exercises.    Social Determinants of Health   Financial Resource Strain: Low Risk  (10/02/2022)   Received from [facility]   Overall Financial Resource Strain (CARDIA)    Difficulty of Paying Living Expenses: Not hard at all  Food Insecurity: No Food Insecurity (10/02/2022)   Received from [facility]   Hunger Vital Sign    Worried About Running Out of Food in the Last Year: Never true    Ran Out of Food in the Last Year: Never true  Transportation Needs: No Transportation Needs (10/02/2022)   Received from [facility] - Transportation    In the past 12 months, has lack of transportation kept you from medical appointments or from getting medications?: No    Lack of Transportation (Non-Medical): No  Physical Activity: Not on file  Stress: Not on file  Social Connections: Not on file  Intimate Partner Violence: Not on file    FH:  Family History  Problem Relation Age of Onset   Alzheimer's disease Other    Heart attack Mother    Hypertension Mother    Cancer Brother        lung  Cancer Maternal Aunt        mouth and breast   Breast cancer Maternal Aunt    Cancer Daughter        throat   Dementia Father     Past Medical History:  Diagnosis Date   Asthma    Cardiomyopathy, nonischemic (HCC)    CHF (congestive heart failure) (HCC)    COPD (chronic obstructive pulmonary disease) (HCC)    Diabetes mellitus, type 2 (HCC)    Full dentures    Gastric ulcer without hemorrhage or perforation    GERD (gastroesophageal reflux disease)    HOH (hard of hearing)    Right Ear   HTN (hypertension)    Hyperlipidemia    IDA (iron deficiency anemia) 04/05/2020   Obstructive sleep apnea on CPAP    Not using CPAP   Osteoarthritis    osteo of  both knees   Scoliosis    Seasonal allergies     Current Outpatient Medications  Medication Sig Dispense Refill   albuterol (PROVENTIL HFA;VENTOLIN HFA) 108 (90 BASE) MCG/ACT inhaler Inhale 2 puffs into the lungs every 6 (six) hours as needed for wheezing or shortness of breath.     ascorbic acid (VITAMIN C) 500 MG tablet Take 500 mg by mouth daily.     aspirin 81 MG chewable tablet Chew by mouth daily.     benzonatate (TESSALON) 200 MG capsule Take 1 capsule by mouth 3 (three) times daily as needed.     busPIRone (BUSPAR) 5 MG tablet Take 1 tablet by mouth 3 (three) times daily.     calcium carbonate (OS-CAL) 600 MG TABS tablet Take 600 mg by mouth 2 (two) times daily with a meal.      cetirizine (ZYRTEC) 10 MG tablet Take 10 mg by mouth daily as needed.     cholecalciferol (VITAMIN D) 400 units TABS tablet Take 1,000 Units by mouth daily.     ciprofloxacin-dexamethasone (CIPRODEX) OTIC suspension Place 4 drops into the left ear 2 (two) times daily.     Cod Liver Oil w/Vit A & D CAPS Take 1 capsule by mouth daily.     empagliflozin (JARDIANCE) 10 MG TABS tablet Take 1 tablet (10 mg total) by mouth daily before breakfast. 90 tablet 3   feeding supplement (ENSURE ENLIVE / ENSURE PLUS) LIQD Take 237 mLs by mouth 3 (three) times daily between meals. 21330 mL 0   ferrous gluconate (FERGON) 324 MG tablet Take 324 mg by mouth daily with breakfast.     furosemide (LASIX) 40 MG tablet Take 0.5 tablets (20 mg total) by mouth daily. 30 tablet 0   glimepiride (AMARYL) 2 MG tablet Take 2 mg by mouth daily with breakfast.     hydrALAZINE (APRESOLINE) 25 MG tablet Take 1 tablet by mouth 2 (two) times daily.     isosorbide mononitrate (IMDUR) 30 MG 24 hr tablet Take 1 tablet by mouth daily.     levocetirizine (XYZAL) 5 MG tablet Take by mouth every evening.     metFORMIN (GLUCOPHAGE) 500 MG tablet Take 1 tablet by mouth 2 (two) times daily with a meal.     metoprolol succinate (TOPROL-XL) 25 MG 24 hr  tablet Take 1 tablet (25 mg total) by mouth daily. 30 tablet 0   montelukast (SINGULAIR) 10 MG tablet Take 1 tablet by mouth daily.     Multiple Vitamin (MULTIVITAMIN) tablet Take 1 tablet by mouth daily.      pantoprazole (PROTONIX) 40 MG tablet Take 40  mg by mouth 2 (two) times daily.     polyethylene glycol (MIRALAX / GLYCOLAX) 17 g packet Take 17 g by mouth daily as needed for severe constipation. 14 each 0   rosuvastatin (CRESTOR) 20 MG tablet Take 1 tablet by mouth daily.     sacubitril-valsartan (ENTRESTO) 49-51 MG Take 1 tablet by mouth 2 (two) times daily. 180 tablet 1   spironolactone (ALDACTONE) 25 MG tablet Take 0.5 tablets (12.5 mg total) by mouth daily. 45 tablet 3   sucralfate (CARAFATE) 1 g tablet Take 1 tablet by mouth 2 (two) times daily before a meal.     traMADol-acetaminophen (ULTRACET) 37.5-325 MG tablet Take 1 tablet by mouth every 8 (eight) hours as needed.     traZODone (DESYREL) 100 MG tablet Take 0.5 tablets by mouth at bedtime.     No current facility-administered medications for this visit.   Vitals:   10/17/22 1429  BP: (!) 124/57  Pulse: 84  SpO2: 100%  Weight: 164 lb (74.4 kg)   Wt Readings from Last 3 Encounters:  10/17/22 164 lb (74.4 kg)  09/11/22 165 lb 4 oz (75 kg)  08/16/22 168 lb (76.2 kg)   Lab Results  Component Value Date   CREATININE 1.30 (H) 09/11/2022   CREATININE 1.50 (H) 08/16/2022   CREATININE 1.27 (H) 07/11/2022   PHYSICAL EXAM:  General:  Well appearing. No resp difficulty HEENT: normal Neck: supple. JVP flat. No lymphadenopathy or thryomegaly appreciated. Cor: PMI normal. Regular rate & rhythm. No rubs, gallops or murmurs. Lungs: clear Abdomen: soft, nontender, nondistended. No hepatosplenomegaly. No bruits or masses.  Extremities: no cyanosis, clubbing, rash, edema Neuro: alert & oriented x3, cranial nerves grossly intact. Moves all 4 extremities w/o difficulty. Affect pleasant.   ECG: not done   ASSESSMENT &  PLAN:  1: NICM with reduced ejection fraction (although improving)- - likely due to HTN - NYHA class III - euvolemic - weighing daily; reminded to call for an overnight weight gain of > 2 pounds or a weekly weight gain of > 5 pounds - weight stable from last visit here 1 month ago - Echo 08/17/15: EF of 40% with mild MR, AR and TR. Mild/mod pulmonary HTN. This is a slight improvement from 12/06/13 when her EF was 35-40%.  - Echo 07/18/21: EF of 35%.  - Echo 09/04/22: EF 40-45% along with Grade I DD, mildly elevated PA pressure and mild/ moderate MR - cardiac catheterization done 05/12/14 - not adding any salt to her food - drinking around 40-60 ounces of fluid daily - continue metoprolol succinate 25mg  daily - continue entresto 24/26mg  BID - continue furosemide 20mg  daily PRN - continue jardiance 10mg  daily - continue spironolactone 12.5mg  daily - saw cardiology Diane Patton) 07/24 - BNP 07/30/21 was 486.9  2: HTN- - BP 124/57 - saw PCP (Diane Patton) 08/24 - BMP 09/25/22 reviewed and shows sodium 144, potassium 4.0, creatinine 1.2 and GFR 46 - BMP today - saw nephrology Diane Patton) 05/23  3: Diabetes- - A1c 09/25/22 was 7.4% - checks glucose every 2-3 days due to cost of test strips  4: Anemia- - saw hematology Diane Patton) 02/23 - hemoglobin 09/25/22 was 11.4  Return in 8 months, sooner if needed.

## 2022-10-18 LAB — BASIC METABOLIC PANEL
BUN/Creatinine Ratio: 11 — ABNORMAL LOW (ref 12–28)
BUN: 15 mg/dL (ref 8–27)
CO2: 23 mmol/L (ref 20–29)
Calcium: 11.3 mg/dL — ABNORMAL HIGH (ref 8.7–10.3)
Chloride: 104 mmol/L (ref 96–106)
Creatinine, Ser: 1.39 mg/dL — ABNORMAL HIGH (ref 0.57–1.00)
Glucose: 120 mg/dL — ABNORMAL HIGH (ref 70–99)
Potassium: 4.6 mmol/L (ref 3.5–5.2)
Sodium: 144 mmol/L (ref 134–144)
eGFR: 39 mL/min/{1.73_m2} — ABNORMAL LOW (ref >59)

## 2023-01-27 ENCOUNTER — Encounter: Payer: Self-pay | Admitting: Emergency Medicine

## 2023-01-27 ENCOUNTER — Emergency Department: Payer: 59

## 2023-01-27 ENCOUNTER — Other Ambulatory Visit: Payer: Self-pay

## 2023-01-27 ENCOUNTER — Inpatient Hospital Stay
Admission: EM | Admit: 2023-01-27 | Discharge: 2023-01-29 | DRG: 190 | Disposition: A | Discharge status: Home / Self Care | Payer: 59 | Attending: Internal Medicine | Admitting: Internal Medicine

## 2023-01-27 DIAGNOSIS — I428 Other cardiomyopathies: Secondary | ICD-10-CM | POA: Diagnosis present

## 2023-01-27 DIAGNOSIS — K219 Gastro-esophageal reflux disease without esophagitis: Secondary | ICD-10-CM | POA: Diagnosis present

## 2023-01-27 DIAGNOSIS — G4730 Sleep apnea, unspecified: Secondary | ICD-10-CM | POA: Diagnosis present

## 2023-01-27 DIAGNOSIS — J440 Chronic obstructive pulmonary disease with acute lower respiratory infection: Secondary | ICD-10-CM | POA: Diagnosis present

## 2023-01-27 DIAGNOSIS — I5023 Acute on chronic systolic (congestive) heart failure: Secondary | ICD-10-CM | POA: Diagnosis present

## 2023-01-27 DIAGNOSIS — G4733 Obstructive sleep apnea (adult) (pediatric): Secondary | ICD-10-CM | POA: Diagnosis present

## 2023-01-27 DIAGNOSIS — I452 Bifascicular block: Secondary | ICD-10-CM | POA: Diagnosis present

## 2023-01-27 DIAGNOSIS — J441 Chronic obstructive pulmonary disease with (acute) exacerbation: Secondary | ICD-10-CM | POA: Diagnosis not present

## 2023-01-27 DIAGNOSIS — I10 Essential (primary) hypertension: Secondary | ICD-10-CM | POA: Diagnosis present

## 2023-01-27 DIAGNOSIS — Z79899 Other long term (current) drug therapy: Secondary | ICD-10-CM

## 2023-01-27 DIAGNOSIS — I130 Hypertensive heart and chronic kidney disease with heart failure and stage 1 through stage 4 chronic kidney disease, or unspecified chronic kidney disease: Principal | ICD-10-CM | POA: Diagnosis present

## 2023-01-27 DIAGNOSIS — Z7984 Long term (current) use of oral hypoglycemic drugs: Secondary | ICD-10-CM

## 2023-01-27 DIAGNOSIS — Z888 Allergy status to other drugs, medicaments and biological substances status: Secondary | ICD-10-CM

## 2023-01-27 DIAGNOSIS — I272 Pulmonary hypertension, unspecified: Secondary | ICD-10-CM | POA: Diagnosis present

## 2023-01-27 DIAGNOSIS — Z7982 Long term (current) use of aspirin: Secondary | ICD-10-CM

## 2023-01-27 DIAGNOSIS — Z87891 Personal history of nicotine dependence: Secondary | ICD-10-CM

## 2023-01-27 DIAGNOSIS — Z8249 Family history of ischemic heart disease and other diseases of the circulatory system: Secondary | ICD-10-CM

## 2023-01-27 DIAGNOSIS — Z1152 Encounter for screening for COVID-19: Secondary | ICD-10-CM

## 2023-01-27 DIAGNOSIS — D631 Anemia in chronic kidney disease: Secondary | ICD-10-CM | POA: Diagnosis present

## 2023-01-27 DIAGNOSIS — I214 Non-ST elevation (NSTEMI) myocardial infarction: Principal | ICD-10-CM | POA: Diagnosis present

## 2023-01-27 DIAGNOSIS — Z91040 Latex allergy status: Secondary | ICD-10-CM

## 2023-01-27 DIAGNOSIS — E1122 Type 2 diabetes mellitus with diabetic chronic kidney disease: Secondary | ICD-10-CM | POA: Diagnosis present

## 2023-01-27 DIAGNOSIS — E785 Hyperlipidemia, unspecified: Secondary | ICD-10-CM | POA: Diagnosis present

## 2023-01-27 DIAGNOSIS — I2489 Other forms of acute ischemic heart disease: Secondary | ICD-10-CM | POA: Diagnosis present

## 2023-01-27 DIAGNOSIS — Z820 Family history of epilepsy and other diseases of the nervous system: Secondary | ICD-10-CM

## 2023-01-27 DIAGNOSIS — J209 Acute bronchitis, unspecified: Secondary | ICD-10-CM | POA: Insufficient documentation

## 2023-01-27 DIAGNOSIS — Z880 Allergy status to penicillin: Secondary | ICD-10-CM

## 2023-01-27 DIAGNOSIS — N1832 Chronic kidney disease, stage 3b: Secondary | ICD-10-CM | POA: Diagnosis present

## 2023-01-27 DIAGNOSIS — E119 Type 2 diabetes mellitus without complications: Secondary | ICD-10-CM

## 2023-01-27 LAB — CBC WITH DIFFERENTIAL/PLATELET
Abs Immature Granulocytes: 0.02 10*3/uL (ref 0.00–0.07)
Basophils Absolute: 0 10*3/uL (ref 0.0–0.1)
Basophils Relative: 0 %
Eosinophils Absolute: 0.2 10*3/uL (ref 0.0–0.5)
Eosinophils Relative: 3 %
HCT: 39.8 % (ref 36.0–46.0)
Hemoglobin: 12.7 g/dL (ref 12.0–15.0)
Immature Granulocytes: 0 %
Lymphocytes Relative: 28 %
Lymphs Abs: 2.5 10*3/uL (ref 0.7–4.0)
MCH: 32.1 pg (ref 26.0–34.0)
MCHC: 31.9 g/dL (ref 30.0–36.0)
MCV: 100.5 fL — ABNORMAL HIGH (ref 80.0–100.0)
Monocytes Absolute: 0.4 10*3/uL (ref 0.1–1.0)
Monocytes Relative: 5 %
Neutro Abs: 5.6 10*3/uL (ref 1.7–7.7)
Neutrophils Relative %: 64 %
Platelets: 216 10*3/uL (ref 150–400)
RBC: 3.96 MIL/uL (ref 3.87–5.11)
RDW: 12.1 % (ref 11.5–15.5)
WBC: 8.7 10*3/uL (ref 4.0–10.5)
nRBC: 0 % (ref 0.0–0.2)

## 2023-01-27 LAB — RESP PANEL BY RT-PCR (RSV, FLU A&B, COVID)  RVPGX2
Influenza A by PCR: NEGATIVE
Influenza B by PCR: NEGATIVE
Resp Syncytial Virus by PCR: NEGATIVE
SARS Coronavirus 2 by RT PCR: NEGATIVE

## 2023-01-27 LAB — COMPREHENSIVE METABOLIC PANEL
ALT: 14 U/L (ref 0–44)
AST: 19 U/L (ref 15–41)
Albumin: 4.5 g/dL (ref 3.5–5.0)
Alkaline Phosphatase: 44 U/L (ref 38–126)
Anion gap: 10 (ref 5–15)
BUN: 25 mg/dL — ABNORMAL HIGH (ref 8–23)
CO2: 22 mmol/L (ref 22–32)
Calcium: 10.1 mg/dL (ref 8.9–10.3)
Chloride: 106 mmol/L (ref 98–111)
Creatinine, Ser: 1.33 mg/dL — ABNORMAL HIGH (ref 0.44–1.00)
GFR, Estimated: 41 mL/min — ABNORMAL LOW (ref >60)
Glucose, Bld: 172 mg/dL — ABNORMAL HIGH (ref 70–99)
Potassium: 4.1 mmol/L (ref 3.5–5.1)
Sodium: 138 mmol/L (ref 135–145)
Total Bilirubin: 0.7 mg/dL (ref <1.2)
Total Protein: 7.7 g/dL (ref 6.5–8.1)

## 2023-01-27 LAB — TROPONIN I (HIGH SENSITIVITY): Troponin I (High Sensitivity): 28 ng/L — ABNORMAL HIGH (ref <18)

## 2023-01-27 MED ORDER — IPRATROPIUM-ALBUTEROL 0.5-2.5 (3) MG/3ML IN SOLN
3.0000 mL | Freq: Once | RESPIRATORY_TRACT | Status: AC
  Administered 2023-01-28: 3 mL via RESPIRATORY_TRACT
  Filled 2023-01-27: qty 3

## 2023-01-27 MED ORDER — METHYLPREDNISOLONE SODIUM SUCC 125 MG IJ SOLR
125.0000 mg | Freq: Once | INTRAMUSCULAR | Status: AC
  Administered 2023-01-28: 125 mg via INTRAVENOUS
  Filled 2023-01-27: qty 2

## 2023-01-27 NOTE — ED Provider Notes (Signed)
[facility] Provider Note    Event Date/Time   First MD Initiated Contact with Patient 01/27/23 2259     (approximate)   History   Shortness of Breath   HPI  Diane Patton is a 77 y.o. female with history of asthma, COPD, CHF, hypertension, diabetes, hyperlipidemia who presents to the emergency department with cough ongoing for about 2 to 3 weeks that worsened today.  She has been feeling short of breath as well.  She has also had some chest discomfort.  No fevers, lower extremity swelling or discomfort.  No history of previous PE or DVT.  She states she has been wheezing and using her nebulizer treatment at home without relief.  Feeling better currently at rest.  No medications given with EMS and route.   History provided by patient.    Past Medical History:  Diagnosis Date   Asthma    Cardiomyopathy, nonischemic (HCC)    CHF (congestive heart failure) (HCC)    COPD (chronic obstructive pulmonary disease) (HCC)    Diabetes mellitus, type 2 (HCC)    Full dentures    Gastric ulcer without hemorrhage or perforation    GERD (gastroesophageal reflux disease)    HOH (hard of hearing)    Right Ear   HTN (hypertension)    Hyperlipidemia    IDA (iron deficiency anemia) 04/05/2020   Obstructive sleep apnea on CPAP    Not using CPAP   Osteoarthritis    osteo of both knees   Scoliosis    Seasonal allergies     Past Surgical History:  Procedure Laterality Date   ABDOMINAL HYSTERECTOMY     ankle (other)     APPENDECTOMY     BREAST BIOPSY Right 1999   neg   BREAST CYST ASPIRATION     neg   BREAST SURGERY     CARDIAC CATHETERIZATION     CLAVICLE SURGERY Right    Fx   COLONOSCOPY WITH PROPOFOL N/A 07/10/2016   Procedure: COLONOSCOPY WITH PROPOFOL;  Surgeon: Christena Deem, MD;  Location: [facility] ENDOSCOPY;  Service: Endoscopy;  Laterality: N/A;   ear (otheR)     ESOPHAGOGASTRODUODENOSCOPY N/A 05/11/2019   Procedure: ESOPHAGOGASTRODUODENOSCOPY  (EGD);  Surgeon: Toledo, Boykin Nearing, MD;  Location: [facility] ENDOSCOPY;  Service: Gastroenterology;  Laterality: N/A;   ESOPHAGOGASTRODUODENOSCOPY (EGD) WITH PROPOFOL N/A 07/10/2016   Procedure: ESOPHAGOGASTRODUODENOSCOPY (EGD) WITH PROPOFOL;  Surgeon: Christena Deem, MD;  Location: [facility] ENDOSCOPY;  Service: Endoscopy;  Laterality: N/A;   ESOPHAGOGASTRODUODENOSCOPY (EGD) WITH PROPOFOL N/A 09/17/2017   Procedure: ESOPHAGOGASTRODUODENOSCOPY (EGD) WITH PROPOFOL;  Surgeon: Christena Deem, MD;  Location: [facility] ENDOSCOPY;  Service: Endoscopy;  Laterality: N/A;   feet (other)     HERNIA REPAIR     MYRINGOTOMY WITH TUBE PLACEMENT Bilateral 03/16/2022   Procedure: MYRINGOTOMY WITH TUBE PLACEMENT WITH BUTTERFLY TUBES;  Surgeon: Linus Salmons, MD;  Location: [facility];  Service: ENT;  Laterality: Bilateral;   sinus (other)     stomach     UPPER ESOPHAGEAL ENDOSCOPIC ULTRASOUND (EUS) N/A 11/22/2016   Procedure: UPPER ESOPHAGEAL ENDOSCOPIC ULTRASOUND (EUS);  Surgeon: Doren Custard, MD;  Location: [facility] ENDOSCOPY;  Service: Gastroenterology;  Laterality: N/A;    MEDICATIONS:  Prior to Admission medications   Medication Sig Start Date End Date Taking? Authorizing Provider  albuterol (PROVENTIL HFA;VENTOLIN HFA) 108 (90 BASE) MCG/ACT inhaler Inhale 2 puffs into the lungs every 6 (six) hours as needed for wheezing or shortness of breath.    [provider]  ascorbic acid (VITAMIN C) 500 MG tablet Take 500 mg by mouth daily.    [provider]  aspirin 81 MG chewable tablet Chew by mouth daily.    [provider]  benzonatate (TESSALON) 200 MG capsule Take 1 capsule by mouth 3 (three) times daily as needed. 02/16/21   [provider]  busPIRone (BUSPAR) 5 MG tablet Take 1 tablet by mouth 3 (three) times daily. 07/15/20   [provider]  calcium carbonate (OS-CAL) 600 MG TABS tablet Take 600 mg by mouth 2 (two) times daily with a meal.     [provider]  cetirizine (ZYRTEC) 10 MG tablet Take 10 mg by mouth daily as needed.    [provider]  cholecalciferol (VITAMIN D) 400 units TABS tablet Take 1,000 Units by mouth daily.    [provider]  ciprofloxacin-dexamethasone (CIPRODEX) OTIC suspension Place 4 drops into the left ear 2 (two) times daily. 08/09/22   [provider]  [facility] Liver Oil w/Vit A & D CAPS Take 1 capsule by mouth daily.    [provider]  empagliflozin (JARDIANCE) 10 MG TABS tablet Take 1 tablet (10 mg total) by mouth daily before breakfast. 09/11/22   Delma Freeze, FNP  feeding supplement (ENSURE ENLIVE / ENSURE PLUS) LIQD Take 237 mLs by mouth 3 (three) times daily between meals. 08/11/21   Alford Highland, MD  ferrous gluconate (FERGON) 324 MG tablet Take 324 mg by mouth daily with breakfast.    [provider]  glimepiride (AMARYL) 2 MG tablet Take 2 mg by mouth daily with breakfast.    [provider]  isosorbide mononitrate (IMDUR) 30 MG 24 hr tablet Take 1 tablet by mouth daily. 02/05/22   [provider]  levocetirizine (XYZAL) 5 MG tablet Take by mouth every evening.    [provider]  metFORMIN (GLUCOPHAGE) 500 MG tablet Take 1 tablet by mouth 2 (two) times daily with a meal. 02/21/22   [provider]  metoprolol succinate (TOPROL-XL) 25 MG 24 hr tablet Take 1 tablet (25 mg total) by mouth daily. 08/11/21   Alford Highland, MD  montelukast (SINGULAIR) 10 MG tablet Take 1 tablet by mouth daily. 02/21/22   [provider]  Multiple Vitamin (MULTIVITAMIN) tablet Take 1 tablet by mouth daily.     [provider]  pantoprazole (PROTONIX) 40 MG tablet Take 40 mg by mouth 2 (two) times daily.    [provider]  polyethylene glycol (MIRALAX / GLYCOLAX) 17 g packet Take 17 g by mouth daily as needed for severe constipation. 08/11/21   Alford Highland, MD  rosuvastatin (CRESTOR) 20 MG tablet Take 1 tablet by  mouth daily. 05/18/21   [provider]  sacubitril-valsartan (ENTRESTO) 49-51 MG Take 1 tablet by mouth 2 (two) times daily. 08/16/22   Delma Freeze, FNP  spironolactone (ALDACTONE) 25 MG tablet Take 0.5 tablets (12.5 mg total) by mouth daily. 09/12/22 12/11/22  Delma Freeze, FNP  sucralfate (CARAFATE) 1 g tablet Take 1 tablet by mouth 2 (two) times daily before a meal. 07/18/21   [provider]  traMADol-acetaminophen (ULTRACET) 37.5-325 MG tablet Take 1 tablet by mouth every 8 (eight) hours as needed. 07/18/21   [provider]  traZODone (DESYREL) 100 MG tablet Take 0.5 tablets by mouth at bedtime. 06/16/21   [provider]    Physical Exam   Triage Vital Signs: ED Triage Vitals  Encounter Vitals Group  BP 01/27/23 2035 (!) 163/90     Systolic BP Percentile --      Diastolic BP Percentile --      Pulse Rate 01/27/23 2035 (!) 121     Resp 01/27/23 2035 20     Temp 01/27/23 2035 98 F (36.7 C)     Temp Source 01/27/23 2035 Oral     SpO2 01/27/23 2035 98 %     Weight 01/27/23 2033 160 lb (72.6 kg)     Height --      Head Circumference --      Peak Flow --      Pain Score 01/27/23 2033 0     Pain Loc --      Pain Education --      Exclude from Growth Chart --     Most recent vital signs: Vitals:   01/27/23 2035  BP: (!) 163/90  Pulse: (!) 121  Resp: 20  Temp: 98 F (36.7 C)  SpO2: 98%    CONSTITUTIONAL: Alert, responds appropriately to questions.  Elderly, no distress HEAD: Normocephalic, atraumatic EYES: Conjunctivae clear, pupils appear equal, sclera nonicteric ENT: normal nose; moist mucous membranes NECK: Supple, normal ROM CARD: Regular and tachycardic; S1 and S2 appreciated RESP: Normal chest excursion without splinting or tachypnea; breath sounds clear and equal bilaterally; no wheezes, no rhonchi, no rales, no hypoxia or respiratory distress, speaking full sentences, diminished aeration at bases bilaterally ABD/GI:  Non-distended; soft, non-tender, no rebound, no guarding, no peritoneal signs BACK: The back appears normal EXT: Normal ROM in all joints; no deformity noted, no edema ,no calf tenderness or calf swelling SKIN: Normal color for age and race; warm; no rash on exposed skin NEURO: Moves all extremities equally, normal speech PSYCH: The patient's mood and manner are appropriate.   ED Results / Procedures / Treatments   LABS: (all labs ordered are listed, but only abnormal results are displayed) Labs Reviewed  CBC WITH DIFFERENTIAL/PLATELET - Abnormal; Notable for the following components:      Result Value   MCV 100.5 (*)    All other components within normal limits  COMPREHENSIVE METABOLIC PANEL - Abnormal; Notable for the following components:   Glucose, Bld 172 (*)    BUN 25 (*)    Creatinine, Ser 1.33 (*)    GFR, Estimated 41 (*)    All other components within normal limits  TROPONIN I (HIGH SENSITIVITY) - Abnormal; Notable for the following components:   Troponin I (High Sensitivity) 28 (*)    All other components within normal limits  RESP PANEL BY RT-PCR (RSV, FLU A&B, COVID)  RVPGX2  D-DIMER, QUANTITATIVE  BRAIN NATRIURETIC PEPTIDE  MAGNESIUM  TROPONIN I (HIGH SENSITIVITY)     EKG:  EKG Interpretation Date/Time:  Sunday January 27 2023 20:40:08 EST Ventricular Rate:  116 PR Interval:  156 QRS Duration:  130 QT Interval:  348 QTC Calculation: 483 R Axis:   1  Text Interpretation: Sinus tachycardia with frequent Premature ventricular complexes Left bundle branch block Abnormal ECG When compared with ECG of 02-Aug-2021 16:23, PREVIOUS ECG IS PRESENT Confirmed by Rochele Raring [phone]) on 01/27/2023 11:27:16 PM         RADIOLOGY: My personal review and interpretation of imaging:  ***  I have personally reviewed all radiology reports.   DG Chest 2 View  Result Date: 01/27/2023 CLINICAL DATA:  Shortness of breath EXAM: CHEST - 2 VIEW COMPARISON:  07/31/2021  FINDINGS: Lungs are clear.  No pleural effusion or  pneumothorax. Heart is normal size.  Thoracic aortic atherosclerosis. Visualized osseous structures are within normal limits. IMPRESSION: Normal chest radiographs. Electronically Signed   By: Charline Bills M.D.   On: 01/27/2023 21:06     PROCEDURES:  Critical Care performed: {CriticalCareYesNo:19197::"Yes, see critical care procedure note(s)","No"}   CRITICAL CARE Performed by: Baxter Hire Perlie Stene   Total critical care time: *** minutes  Critical care time was exclusive of separately billable procedures and treating other patients.  Critical care was necessary to treat or prevent imminent or life-threatening deterioration.  Critical care was time spent personally by me on the following activities: development of treatment plan with patient and/or surrogate as well as nursing, discussions with consultants, evaluation of patient's response to treatment, examination of patient, obtaining history from patient or surrogate, ordering and performing treatments and interventions, ordering and review of laboratory studies, ordering and review of radiographic studies, pulse oximetry and re-evaluation of patient's condition.   Marland Kitchen1-3 Lead EKG Interpretation  Performed by: Brek Reece, Layla Maw, DO Authorized by: Oriel Ojo, Layla Maw, DO     Interpretation: abnormal     ECG rate:  127   ECG rate assessment: tachycardic     Rhythm: sinus tachycardia     Ectopy: none     Conduction: normal       IMPRESSION / MDM / ASSESSMENT AND PLAN / ED COURSE  I reviewed the triage vital signs and the nursing notes.    Patient here with chest pain, shortness of breath, wheezing at home.  The patient is on the cardiac monitor to evaluate for evidence of arrhythmia and/or significant heart rate changes.   DIFFERENTIAL DIAGNOSIS (includes but not limited to):   COPD exacerbation, asthma exacerbation, CHF exacerbation, ACS, pneumonia, pneumothorax, viral  URI   Patient's presentation is most consistent with acute presentation with potential threat to life or bodily function.   PLAN: Patient's labs showed no leukocytosis, normal hemoglobin.  Creatinine elevated which is her baseline.  First troponin 28.  Second pending.  Will add on BNP and obtain D-dimer.  COVID, flu and RSV negative.  Chest x-ray reviewed and interpreted by myself and the radiologist and shows no acute abnormality.  EKG does show ectopy.  Potassium is normal.  Will check magnesium level.  No ischemia.  Will give breathing treatment, Solu-Medrol and reassess.   MEDICATIONS GIVEN IN ED: Medications  ipratropium-albuterol (DUONEB) 0.5-2.5 (3) MG/3ML nebulizer solution 3 mL (has no administration in time range)  methylPREDNISolone sodium succinate (SOLU-MEDROL) 125 mg/2 mL injection 125 mg (has no administration in time range)     ED COURSE:  ***   CONSULTS:  ***   OUTSIDE RECORDS REVIEWED: Reviewed last cardiology note on 10/17/2022.       FINAL CLINICAL IMPRESSION(S) / ED DIAGNOSES   Final diagnoses:  None     Rx / DC Orders   ED Discharge Orders     None        Note:  This document was prepared using Dragon voice recognition software and may include unintentional dictation errors.

## 2023-01-27 NOTE — ED Triage Notes (Signed)
Patient BIB [facility] from home c/o sob since this AM.  Patient reports coughing up [place], thick sputum. Patient has a history of asthma.  EMS reports patient stated that she started feeling better during transport.

## 2023-01-28 ENCOUNTER — Other Ambulatory Visit: Payer: Self-pay

## 2023-01-28 ENCOUNTER — Observation Stay
Admit: 2023-01-28 | Discharge: 2023-01-28 | Disposition: A | Discharge status: Home / Self Care | Payer: 59 | Attending: Internal Medicine

## 2023-01-28 ENCOUNTER — Encounter: Payer: Self-pay | Admitting: Internal Medicine

## 2023-01-28 DIAGNOSIS — Z8249 Family history of ischemic heart disease and other diseases of the circulatory system: Secondary | ICD-10-CM | POA: Diagnosis not present

## 2023-01-28 DIAGNOSIS — Z79899 Other long term (current) drug therapy: Secondary | ICD-10-CM | POA: Diagnosis not present

## 2023-01-28 DIAGNOSIS — N1832 Chronic kidney disease, stage 3b: Secondary | ICD-10-CM | POA: Diagnosis present

## 2023-01-28 DIAGNOSIS — I214 Non-ST elevation (NSTEMI) myocardial infarction: Secondary | ICD-10-CM | POA: Diagnosis present

## 2023-01-28 DIAGNOSIS — E785 Hyperlipidemia, unspecified: Secondary | ICD-10-CM | POA: Diagnosis present

## 2023-01-28 DIAGNOSIS — J209 Acute bronchitis, unspecified: Secondary | ICD-10-CM | POA: Insufficient documentation

## 2023-01-28 DIAGNOSIS — D631 Anemia in chronic kidney disease: Secondary | ICD-10-CM | POA: Diagnosis present

## 2023-01-28 DIAGNOSIS — I272 Pulmonary hypertension, unspecified: Secondary | ICD-10-CM | POA: Diagnosis present

## 2023-01-28 DIAGNOSIS — Z888 Allergy status to other drugs, medicaments and biological substances status: Secondary | ICD-10-CM | POA: Diagnosis not present

## 2023-01-28 DIAGNOSIS — I2489 Other forms of acute ischemic heart disease: Secondary | ICD-10-CM | POA: Diagnosis present

## 2023-01-28 DIAGNOSIS — J440 Chronic obstructive pulmonary disease with acute lower respiratory infection: Secondary | ICD-10-CM

## 2023-01-28 DIAGNOSIS — E1122 Type 2 diabetes mellitus with diabetic chronic kidney disease: Secondary | ICD-10-CM | POA: Diagnosis present

## 2023-01-28 DIAGNOSIS — I428 Other cardiomyopathies: Secondary | ICD-10-CM | POA: Diagnosis present

## 2023-01-28 DIAGNOSIS — Z87891 Personal history of nicotine dependence: Secondary | ICD-10-CM | POA: Diagnosis not present

## 2023-01-28 DIAGNOSIS — K219 Gastro-esophageal reflux disease without esophagitis: Secondary | ICD-10-CM | POA: Diagnosis present

## 2023-01-28 DIAGNOSIS — Z7982 Long term (current) use of aspirin: Secondary | ICD-10-CM | POA: Diagnosis not present

## 2023-01-28 DIAGNOSIS — Z7984 Long term (current) use of oral hypoglycemic drugs: Secondary | ICD-10-CM | POA: Diagnosis not present

## 2023-01-28 DIAGNOSIS — Z880 Allergy status to penicillin: Secondary | ICD-10-CM | POA: Diagnosis not present

## 2023-01-28 DIAGNOSIS — J441 Chronic obstructive pulmonary disease with (acute) exacerbation: Secondary | ICD-10-CM | POA: Diagnosis present

## 2023-01-28 DIAGNOSIS — I130 Hypertensive heart and chronic kidney disease with heart failure and stage 1 through stage 4 chronic kidney disease, or unspecified chronic kidney disease: Secondary | ICD-10-CM | POA: Diagnosis present

## 2023-01-28 DIAGNOSIS — G4733 Obstructive sleep apnea (adult) (pediatric): Secondary | ICD-10-CM | POA: Diagnosis present

## 2023-01-28 DIAGNOSIS — I452 Bifascicular block: Secondary | ICD-10-CM | POA: Diagnosis present

## 2023-01-28 DIAGNOSIS — Z1152 Encounter for screening for COVID-19: Secondary | ICD-10-CM | POA: Diagnosis not present

## 2023-01-28 DIAGNOSIS — Z820 Family history of epilepsy and other diseases of the nervous system: Secondary | ICD-10-CM | POA: Diagnosis not present

## 2023-01-28 DIAGNOSIS — I5023 Acute on chronic systolic (congestive) heart failure: Secondary | ICD-10-CM | POA: Diagnosis present

## 2023-01-28 LAB — ECHOCARDIOGRAM COMPLETE
AR max vel: 2.07 cm2
AV Area VTI: 1.98 cm2
AV Area mean vel: 1.84 cm2
AV Mean grad: 6 mm[Hg]
AV Peak grad: 11 mm[Hg]
Ao pk vel: 1.66 m/s
Area-P 1/2: 5.62 cm2
MV VTI: 2.83 cm2
S' Lateral: 3.1 cm
Weight: 2560 [oz_av]

## 2023-01-28 LAB — D-DIMER, QUANTITATIVE: D-Dimer, Quant: 0.38 ug{FEU}/mL (ref 0.00–0.50)

## 2023-01-28 LAB — PROTIME-INR
INR: 1 (ref 0.8–1.2)
Prothrombin Time: 13.7 s (ref 11.4–15.2)

## 2023-01-28 LAB — HEPARIN LEVEL (UNFRACTIONATED): Heparin Unfractionated: 0.37 [IU]/mL (ref 0.30–0.70)

## 2023-01-28 LAB — HEMOGLOBIN A1C
Hgb A1c MFr Bld: 7.3 % — ABNORMAL HIGH (ref 4.8–5.6)
Mean Plasma Glucose: 162.81 mg/dL

## 2023-01-28 LAB — BRAIN NATRIURETIC PEPTIDE: B Natriuretic Peptide: 264.5 pg/mL — ABNORMAL HIGH (ref 0.0–100.0)

## 2023-01-28 LAB — TROPONIN I (HIGH SENSITIVITY)
Troponin I (High Sensitivity): 57 ng/L — ABNORMAL HIGH
Troponin I (High Sensitivity): 57 ng/L — ABNORMAL HIGH (ref <18)

## 2023-01-28 LAB — CBG MONITORING, ED
Glucose-Capillary: 261 mg/dL — ABNORMAL HIGH (ref 70–99)
Glucose-Capillary: 248 mg/dL — ABNORMAL HIGH (ref 70–99)
Glucose-Capillary: 252 mg/dL — ABNORMAL HIGH (ref 70–99)

## 2023-01-28 LAB — GLUCOSE, CAPILLARY: Glucose-Capillary: 275 mg/dL — ABNORMAL HIGH (ref 70–99)

## 2023-01-28 LAB — MAGNESIUM: Magnesium: 1.8 mg/dL (ref 1.7–2.4)

## 2023-01-28 LAB — APTT: aPTT: 30 s (ref 24–36)

## 2023-01-28 MED ORDER — ONDANSETRON 4 MG PO TBDP
4.0000 mg | ORAL_TABLET | Freq: Once | ORAL | Status: AC
  Administered 2023-01-28: 4 mg via ORAL
  Filled 2023-01-28: qty 1

## 2023-01-28 MED ORDER — ASPIRIN 81 MG PO CHEW
324.0000 mg | CHEWABLE_TABLET | Freq: Once | ORAL | Status: AC
  Administered 2023-01-28: 324 mg via ORAL
  Filled 2023-01-28: qty 4

## 2023-01-28 MED ORDER — ISOSORBIDE MONONITRATE ER 30 MG PO TB24
30.0000 mg | ORAL_TABLET | Freq: Every day | ORAL | Status: DC
  Administered 2023-01-28 – 2023-01-29 (×2): 30 mg via ORAL
  Filled 2023-01-28 (×2): qty 1

## 2023-01-28 MED ORDER — ASPIRIN 81 MG PO TBEC
81.0000 mg | DELAYED_RELEASE_TABLET | Freq: Every day | ORAL | Status: DC
  Administered 2023-01-29: 81 mg via ORAL
  Filled 2023-01-28: qty 1

## 2023-01-28 MED ORDER — TRAZODONE HCL 50 MG PO TABS
50.0000 mg | ORAL_TABLET | Freq: Every day | ORAL | Status: DC
  Administered 2023-01-28: 50 mg via ORAL
  Filled 2023-01-28: qty 1

## 2023-01-28 MED ORDER — ONDANSETRON HCL 4 MG/2ML IJ SOLN: 4.0000 mg | Freq: Four times a day (QID) | INTRAMUSCULAR | Status: DC | PRN

## 2023-01-28 MED ORDER — PERFLUTREN LIPID MICROSPHERE
1.0000 mL | INTRAVENOUS | Status: AC | PRN
  Administered 2023-01-28: 5 mL via INTRAVENOUS

## 2023-01-28 MED ORDER — EMPAGLIFLOZIN 10 MG PO TABS
10.0000 mg | ORAL_TABLET | Freq: Every day | ORAL | Status: DC
  Administered 2023-01-28 – 2023-01-29 (×2): 10 mg via ORAL
  Filled 2023-01-28 (×2): qty 1

## 2023-01-28 MED ORDER — ACETAMINOPHEN 325 MG PO TABS: 650.0000 mg | ORAL_TABLET | ORAL | Status: DC | PRN

## 2023-01-28 MED ORDER — FUROSEMIDE 10 MG/ML IJ SOLN
40.0000 mg | Freq: Once | INTRAMUSCULAR | Status: AC
Start: 2023-01-28 — End: 2023-01-28
  Administered 2023-01-28: 40 mg via INTRAVENOUS
  Filled 2023-01-28: qty 4

## 2023-01-28 MED ORDER — METOPROLOL SUCCINATE ER 25 MG PO TB24
25.0000 mg | ORAL_TABLET | Freq: Every day | ORAL | Status: DC
  Administered 2023-01-28 – 2023-01-29 (×2): 25 mg via ORAL
  Filled 2023-01-28 (×2): qty 1

## 2023-01-28 MED ORDER — INSULIN ASPART 100 UNIT/ML IJ SOLN
0.0000 [IU] | Freq: Three times a day (TID) | INTRAMUSCULAR | Status: DC
Start: 2023-01-28 — End: 2023-01-29
  Administered 2023-01-28: 3 [IU] via SUBCUTANEOUS
  Administered 2023-01-28: 5 [IU] via SUBCUTANEOUS
  Administered 2023-01-28: 8 [IU] via SUBCUTANEOUS
  Administered 2023-01-29: 5 [IU] via SUBCUTANEOUS
  Administered 2023-01-29: 3 [IU] via SUBCUTANEOUS
  Filled 2023-01-28 (×5): qty 1

## 2023-01-28 MED ORDER — ROSUVASTATIN CALCIUM 10 MG PO TABS
20.0000 mg | ORAL_TABLET | Freq: Every day | ORAL | Status: DC
  Administered 2023-01-28 – 2023-01-29 (×2): 20 mg via ORAL
  Filled 2023-01-28: qty 2
  Filled 2023-01-28: qty 1

## 2023-01-28 MED ORDER — ASPIRIN 81 MG PO CHEW
81.0000 mg | CHEWABLE_TABLET | Freq: Every day | ORAL | Status: DC
Start: 2023-01-28 — End: 2023-01-28

## 2023-01-28 MED ORDER — BENZONATATE 100 MG PO CAPS
200.0000 mg | ORAL_CAPSULE | Freq: Three times a day (TID) | ORAL | Status: DC | PRN
  Administered 2023-01-29: 200 mg via ORAL
  Filled 2023-01-28: qty 2

## 2023-01-28 MED ORDER — HEPARIN (PORCINE) 25000 UT/250ML-% IV SOLN
800.0000 [IU]/h | INTRAVENOUS | Status: DC
  Administered 2023-01-28: 800 [IU]/h via INTRAVENOUS
  Filled 2023-01-28: qty 250

## 2023-01-28 MED ORDER — INSULIN ASPART 100 UNIT/ML IJ SOLN
0.0000 [IU] | Freq: Every day | INTRAMUSCULAR | Status: DC
  Administered 2023-01-28: 3 [IU] via SUBCUTANEOUS
  Filled 2023-01-28: qty 1

## 2023-01-28 MED ORDER — SPIRONOLACTONE 12.5 MG HALF TABLET
12.5000 mg | ORAL_TABLET | Freq: Every day | ORAL | Status: DC
  Administered 2023-01-28 – 2023-01-29 (×2): 12.5 mg via ORAL
  Filled 2023-01-28 (×2): qty 1

## 2023-01-28 MED ORDER — SACUBITRIL-VALSARTAN 49-51 MG PO TABS
1.0000 | ORAL_TABLET | Freq: Two times a day (BID) | ORAL | Status: DC
  Administered 2023-01-28 – 2023-01-29 (×3): 1 via ORAL
  Filled 2023-01-28 (×4): qty 1

## 2023-01-28 MED ORDER — HEPARIN BOLUS VIA INFUSION
4000.0000 [IU] | Freq: Once | INTRAVENOUS | Status: AC
  Administered 2023-01-28: 4000 [IU] via INTRAVENOUS
  Filled 2023-01-28: qty 4000

## 2023-01-28 MED ORDER — NITROGLYCERIN 0.4 MG SL SUBL: 0.4000 mg | SUBLINGUAL_TABLET | SUBLINGUAL | Status: DC | PRN

## 2023-01-28 NOTE — ED Notes (Signed)
Alert and oriented. Sitting up for dinner. Cut food up for patient. Talking on phone with family. Denies any pain or short of breath. Skin warm and dry. VSS.

## 2023-01-28 NOTE — Assessment & Plan Note (Signed)
Sliding scale insulin coverage 

## 2023-01-28 NOTE — Progress Notes (Signed)
*  PRELIMINARY RESULTS* Echocardiogram 2D Echocardiogram has been performed.  Carolyne Fiscal 01/28/2023, 10:49 AM

## 2023-01-28 NOTE — Assessment & Plan Note (Signed)
-

## 2023-01-28 NOTE — Consult Note (Signed)
[facility]IN[facility] CARDIOLOGY CONSULT NOTE       Patient ID: Diane Patton MRN: [id number] DOB/AGE: 79-09-1945 79 y.o.  Admit date: 01/27/2023 Referring Physician Dr. Lindajo Royal Primary Physician Barbette Reichmann, MD  Primary Cardiologist Leanora Ivanoff, PA Reason for Consultation AoCHF  HPI: Diane Patton is a 77 y.o. female  with a past medical history of chronic heart failure reduced EF (40-45% in 08/2022), nonischemic cardiomyopathy, hypertension, hyperlipidemia, OSA, COPD, pulmonary HTN, type II diabetes, GERD who presented to the ED on 01/27/2023 for shortness of breath. Cardiology was consulted for further evaluation.   Patient reports that yesterday morning she noticed she was feeling more short of breath.  States that she is also recently had a more productive cough over the last few weeks, also noticed this was worse yesterday.  States that her symptoms felt similar to prior heart failure exacerbations she has had in the past.  Decided to come to the ED for further evaluation.  Workup in the ED notable for creatinine 1.33 (near baseline), potassium 4.1, magnesium 1.8, hemoglobin 12.7, WBC 8.7.  BNP mildly elevated at 264.  Troponins trended 28 > 57 > 57.  EKG demonstrated sinus tachycardia with chronic right bundle branch block and PVCs.  Chest x-ray was relatively unremarkable.  She was treated for COPD exacerbation and CHF exacerbation in the ED.  Evaluation this morning patient is resting comfortably in ED stretcher.  States that she feels significantly better than she did yesterday.  States that she was taken off of her daily Lasix about a year ago when she was started on an SGLT2.  Has noticed occasional episodes of worsening shortness of breath over the last few months but none this significant.  She denies any current or recent episodes of chest pain/pressure.  Also denies any episodes of palpitation, dizziness, syncope.  States that she has not noticed any significant lower extremity edema.   She remains on room air and has not required any supplemental oxygen.  Review of systems complete and found to be negative unless listed above    Past Medical History:  Diagnosis Date   Asthma    Cardiomyopathy, nonischemic (HCC)    CHF (congestive heart failure) (HCC)    COPD (chronic obstructive pulmonary disease) (HCC)    Diabetes mellitus, type 2 (HCC)    Full dentures    Gastric ulcer without hemorrhage or perforation    GERD (gastroesophageal reflux disease)    HOH (hard of hearing)    Right Ear   HTN (hypertension)    Hyperlipidemia    IDA (iron deficiency anemia) 04/05/2020   Obstructive sleep apnea on CPAP    Not using CPAP   Osteoarthritis    osteo of both knees   Scoliosis    Seasonal allergies     Past Surgical History:  Procedure Laterality Date   ABDOMINAL HYSTERECTOMY     ankle (other)     APPENDECTOMY     BREAST BIOPSY Right 1999   neg   BREAST CYST ASPIRATION     neg   BREAST SURGERY     CARDIAC CATHETERIZATION     CLAVICLE SURGERY Right    Fx   COLONOSCOPY WITH PROPOFOL N/A 07/10/2016   Procedure: COLONOSCOPY WITH PROPOFOL;  Surgeon: Christena Deem, MD;  Location: [facility] ENDOSCOPY;  Service: Endoscopy;  Laterality: N/A;   ear (otheR)     ESOPHAGOGASTRODUODENOSCOPY N/A 05/11/2019   Procedure: ESOPHAGOGASTRODUODENOSCOPY (EGD);  Surgeon: Tol[place], Boykin Nearing, MD;  Location: [facility] ENDOSCOPY;  Service:  Gastroenterology;  Laterality: N/A;   ESOPHAGOGASTRODUODENOSCOPY (EGD) WITH PROPOFOL N/A 07/10/2016   Procedure: ESOPHAGOGASTRODUODENOSCOPY (EGD) WITH PROPOFOL;  Surgeon: Christena Deem, MD;  Location: [facility] ENDOSCOPY;  Service: Endoscopy;  Laterality: N/A;   ESOPHAGOGASTRODUODENOSCOPY (EGD) WITH PROPOFOL N/A 09/17/2017   Procedure: ESOPHAGOGASTRODUODENOSCOPY (EGD) WITH PROPOFOL;  Surgeon: Christena Deem, MD;  Location: [facility] ENDOSCOPY;  Service: Endoscopy;  Laterality: N/A;   feet (other)     HERNIA REPAIR     MYRINGOTOMY WITH TUBE PLACEMENT Bilateral  03/16/2022   Procedure: MYRINGOTOMY WITH TUBE PLACEMENT WITH BUTTERFLY TUBES;  Surgeon: Linus Salmons, MD;  Location: [facility];  Service: ENT;  Laterality: Bilateral;   sinus (other)     stomach     UPPER ESOPHAGEAL ENDOSCOPIC ULTRASOUND (EUS) N/A 11/22/2016   Procedure: UPPER ESOPHAGEAL ENDOSCOPIC ULTRASOUND (EUS);  Surgeon: Doren Custard, MD;  Location: [facility] ENDOSCOPY;  Service: Gastroenterology;  Laterality: N/A;    (Not in a hospital admission)  Social History   Socioeconomic History   Marital status: Married    Spouse name: Not on file   Number of children: Not on file   Years of education: Not on file   Highest education level: Not on file  Occupational History   Occupation: retired  Tobacco Use   Smoking status: Former    Current packs/day: 0.00    Average packs/day: 1 pack/day for 15.0 years (15.0 ttl pk-yrs)    Types: Cigarettes    Start date: 07/21/1967    Quit date: 07/21/1982    Years since quitting: 40.5   Smokeless tobacco: Never  Vaping Use   Vaping status: Never Used  Substance and Sexual Activity   Alcohol use: No    Alcohol/week: 15.0 standard drinks of alcohol    Types: 15 Standard drinks or equivalent per week    Comment: quit drinking 04/1981   Drug use: No   Sexual activity: Not on file  Other Topics Concern   Not on file  Social History Narrative   Disabled. Regularly exercises.    Social Determinants of Health   Financial Resource Strain: Low Risk  (10/02/2022)   Received from [facility]   Overall Financial Resource Strain (CARDIA)    Difficulty of Paying Living Expenses: Not hard at all  Food Insecurity: No Food Insecurity (10/02/2022)   Received from [facility]   Hunger Vital Sign    Worried About Running Out of Food in the Last Year: Never true    Ran Out of Food in the Last Year: Never true  Transportation Needs: No Transportation Needs (01/28/2023)   PRAPARE - Scientist, research (physical sciences) (Medical): No    Lack of Transportation (Non-Medical): No  Physical Activity: Not on file  Stress: Not on file  Social Connections: Not on file  Intimate Partner Violence: Not on file    Family History  Problem Relation Age of Onset   Alzheimer's disease Other    Heart attack Mother    Hypertension Mother    Cancer Brother        lung   Cancer Maternal Aunt        mouth and breast   Breast cancer Maternal Aunt    Cancer Daughter        throat   Dementia Father      Vitals:   01/28/23 0845 01/28/23 0847 01/28/23 0900 01/28/23 0920  BP:   122/67   Pulse: (!) 113   (!) 106  Resp: (!) 29   20  Temp:  99.6 F (37.6 C)    TempSrc:  Oral    SpO2: 100%   96%  Weight:        PHYSICAL EXAM General: Well appearing elderly female, well nourished, in no acute distress sitting upright in ED stretcher. HEENT: Normocephalic and atraumatic. Neck: No JVD.  Lungs: Normal respiratory effort on room air. Clear bilaterally to auscultation. No wheezes, crackles, rhonchi.  Heart: HRRR. Normal S1 and S2 without gallops or murmurs.  Abdomen: Non-distended appearing.  Msk: Normal strength and tone for age. Extremities: Warm and well perfused. No clubbing, cyanosis. No edema.  Neuro: Alert and oriented X 3. Psych: Answers questions appropriately.   Labs: Basic Metabolic Panel: Recent Labs    01/27/23 2037  NA 138  K 4.1  CL 106  CO2 22  GLUCOSE 172*  BUN 25*  CREATININE 1.33*  CALCIUM 10.1  MG 1.8   Liver Function Tests: Recent Labs    01/27/23 2037  AST 19  ALT 14  ALKPHOS 44  BILITOT 0.7  PROT 7.7  ALBUMIN 4.5   No results for input(s): "LIPASE", "AMYLASE" in the last 72 hours. CBC: Recent Labs    01/27/23 2037  WBC 8.7  NEUTROABS 5.6  HGB 12.7  HCT 39.8  MCV 100.5*  PLT 216   Cardiac Enzymes: Recent Labs    01/27/23 2037 01/27/23 2351 01/28/23 1052  TROPONINIHS 28* 57* 57*   BNP: Recent Labs    01/27/23 2037  BNP 264.5*    D-Dimer: Recent Labs    01/27/23 2351  DDIMER 0.38   Hemoglobin A1C: Recent Labs    01/28/23 0534  HGBA1C 7.3*   Fasting Lipid Panel: No results for input(s): "CHOL", "HDL", "LDLCALC", "TRIG", "CHOLHDL", "LDLDIRECT" in the last 72 hours. Thyroid Function Tests: No results for input(s): "TSH", "T4TOTAL", "T3FREE", "THYROIDAB" in the last 72 hours.  Invalid input(s): "FREET3" Anemia Panel: No results for input(s): "VITAMINB12", "FOLATE", "FERRITIN", "TIBC", "IRON", "RETICCTPCT" in the last 72 hours.   Radiology: DG Chest 2 View  Result Date: 01/27/2023 CLINICAL DATA:  Shortness of breath EXAM: CHEST - 2 VIEW COMPARISON:  07/31/2021 FINDINGS: Lungs are clear.  No pleural effusion or pneumothorax. Heart is normal size.  Thoracic aortic atherosclerosis. Visualized osseous structures are within normal limits. IMPRESSION: Normal chest radiographs. Electronically Signed   By: Charline Bills M.D.   On: 01/27/2023 21:06    ECHO pending  TELEMETRY reviewed by me 01/28/2023: sinus tachycardia PVCs rate 100s  EKG reviewed by me: sinus tachycardia rate 104 bpm, PVCs, chronic LBBB  Data reviewed by me 01/28/2023: last 24h vitals tele labs imaging I/O ED provider note, admission H&P  Principal Problem:   Acute on chronic systolic CHF (congestive heart failure) (HCC) Active Problems:   Essential hypertension   Diabetes mellitus, type 2 (HCC)   Sleep apnea   Stage 3b chronic kidney disease (HCC)   COPD with acute bronchitis (HCC)   Demand ischemia (HCC)    ASSESSMENT AND PLAN:  Diane Patton is a 77 y.o. female  with a past medical history of chronic heart failure reduced EF (40-45% in 08/2022), nonischemic cardiomyopathy, hypertension, hyperlipidemia, OSA, COPD, pulmonary HTN, type II diabetes, GERD who presented to the ED on 01/27/2023 for shortness of breath. Cardiology was consulted for further evaluation.   # Acute on chronic HFrEF, NICM # Acute COPD exacerbation # Demand  ischemia Patient presented with worsening SOB x1 day, worsening productive cough. Reports sxs  similar to prior heart failure exacerbations. BNP 264. Troponins 28 > 57 > 57. CXR without significant abnormality.  -Will give additional dose of IV lasix this afternoon and transition to po tomorrow.  -Continue entresto 49-51 mg twice daily, spironolactone 12.5 mg daily, metoprolol succinate 25 mg daily, jardiance 10 mg daily, imdur 30 mg daily.  -Mild troponin elevation most consistent with demand/supply mismatch and not ACS in the setting of acute heart failure, acute COPD exacerbation.  -DC heparin infusion.  -Monitor renal function closely with diuresis. -Monitor and replenish electrolytes for a goal K >4, Mag >2    # Chronic kidney disease stage III Patient with hx of CKD, Cr 1.33 on admission which appears near baseline.  -Continue to monitor renal function closely.  # Hyperlipidemia -Continue home crestor 20 mg daily and aspirin 81 mg daily.   This patient's plan of care was discussed and created with Dr. Juliann Pares and he is in agreement.  Signed: Gale Journey, PA-C  01/28/2023, 12:20 PM [facility]

## 2023-01-28 NOTE — H&P (Signed)
History and Physical    Patient: Diane Patton VWU:[id number] DOB: 10/09/1945 DOA: 01/27/2023 DOS: the patient was seen and examined on 01/28/2023 PCP: Barbette Reichmann, MD  Patient coming from: Home  Chief Complaint:  Chief Complaint  Patient presents with   Shortness of Breath    HPI: Diane Patton is a 77 y.o. 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  She denies fever or chills.  Denies leg pain or lower extremity edema. ED course and data review: BP 163/90, pulse 121, O2 sat 96% on room air Labs notable for troponin 28->57 and BNP 264 WBC normal at 8700 with negative respiratory viral panel Creatinine at baseline D-dimer 0.38 EKG, personally viewed and interpreted showing sinus tachycardia at 116 with new LBBB Chest x-ray nonacute Patient was initially treated with DuoNebs and Solu-Medrol but subsequently given a dose of aspirin and started on heparin for possible NSTEMI. Hospitalist consulted for admission.   Review of Systems: As mentioned in the history of present illness. All other systems reviewed and are negative.  Past Medical History:  Diagnosis Date   Asthma    Cardiomyopathy, nonischemic (HCC)    CHF (congestive heart failure) (HCC)    COPD (chronic obstructive pulmonary disease) (HCC)    Diabetes mellitus, type 2 (HCC)    Full dentures    Gastric ulcer without hemorrhage or perforation    GERD (gastroesophageal reflux disease)    HOH (hard of hearing)    Right Ear   HTN (hypertension)    Hyperlipidemia    IDA (iron deficiency anemia) 04/05/2020   Obstructive sleep apnea on CPAP    Not using CPAP   Osteoarthritis    osteo of both knees   Scoliosis    Seasonal allergies     Past Surgical History:  Procedure Laterality Date   ABDOMINAL HYSTERECTOMY     ankle (other)     APPENDECTOMY     BREAST BIOPSY Right 1999   neg   BREAST CYST ASPIRATION     neg   BREAST SURGERY     CARDIAC CATHETERIZATION     CLAVICLE SURGERY Right    Fx   COLONOSCOPY WITH PROPOFOL N/A 07/10/2016   Procedure: COLONOSCOPY WITH PROPOFOL;  Surgeon: Christena Deem, MD;  Location: [facility] ENDOSCOPY;  Service: Endoscopy;  Laterality: N/A;   ear (otheR)     ESOPHAGOGASTRODUODENOSCOPY N/A 05/11/2019   Procedure: ESOPHAGOGASTRODUODENOSCOPY (EGD);  Surgeon: Toledo, Boykin Nearing, MD;  Location: [facility] ENDOSCOPY;  Service: Gastroenterology;  Laterality: N/A;   ESOPHAGOGASTRODUODENOSCOPY (EGD) WITH PROPOFOL N/A 07/10/2016   Procedure: ESOPHAGOGASTRODUODENOSCOPY (EGD) WITH PROPOFOL;  Surgeon: Christena Deem, MD;  Location: [facility] ENDOSCOPY;  Service: Endoscopy;  Laterality: N/A;   ESOPHAGOGASTRODUODENOSCOPY (EGD) WITH PROPOFOL N/A 09/17/2017   Procedure: ESOPHAGOGASTRODUODENOSCOPY (EGD) WITH PROPOFOL;  Surgeon: Christena Deem, MD;  Location: [facility] ENDOSCOPY;  Service: Endoscopy;  Laterality: N/A;   feet (other)     HERNIA REPAIR     MYRINGOTOMY WITH TUBE PLACEMENT Bilateral 03/16/2022   Procedure: MYRINGOTOMY WITH TUBE PLACEMENT WITH BUTTERFLY TUBES;  Surgeon: Linus Salmons, MD;  Location: [facility]GERY [facility];  Service: ENT;  Laterality: Bilateral;   sinus (other)     stomach     UPPER ESOPHAGEAL ENDOSCOPIC ULTRASOUND (EUS) N/A 11/22/2016   Procedure:  UPPER ESOPHAGEAL ENDOSCOPIC ULTRASOUND (EUS);  Surgeon: Doren Custard, MD;  Location: [facility] ENDOSCOPY;  Service: Gastroenterology;  Laterality: N/A;   Social History:  reports that she quit smoking about 40 years ago. Her smoking use included cigarettes. She started smoking about 55 years ago. She has a 15 pack-year smoking history. She has never used smokeless tobacco. She reports that she does not drink alcohol and does not use  drugs.  Allergies  Allergen Reactions   Latex Itching and Swelling    gloves   Salicylates    Allegra [Fexofenadine] Cough   Amoxicillin Other (See Comments)    GI distress   Aspirin Diarrhea and Nausea And Vomiting    Can use coated aspirin   Celebrex [Celecoxib] Nausea Only   Penicillins Other (See Comments)    GI distress    Family History  Problem Relation Age of Onset   Alzheimer's disease Other    Heart attack Mother    Hypertension Mother    Cancer Brother        lung   Cancer Maternal Aunt        mouth and breast   Breast cancer Maternal Aunt    Cancer Daughter        throat   Dementia Father     Prior to Admission medications   Medication Sig Start Date End Date Taking? Authorizing Provider  albuterol (PROVENTIL HFA;VENTOLIN HFA) 108 (90 BASE) MCG/ACT inhaler Inhale 2 puffs into the lungs every 6 (six) hours as needed for wheezing or shortness of breath.    [provider]  ascorbic acid (VITAMIN C) 500 MG tablet Take 500 mg by mouth daily.    [provider]  aspirin 81 MG chewable tablet Chew by mouth daily.    [provider]  benzonatate (TESSALON) 200 MG capsule Take 1 capsule by mouth 3 (three) times daily as needed. 02/16/21   [provider]  busPIRone (BUSPAR) 5 MG tablet Take 1 tablet by mouth 3 (three) times daily. 07/15/20   [provider]  calcium carbonate (OS-CAL) 600 MG TABS tablet Take 600 mg by mouth 2 (two) times daily with a meal.     [provider]  cetirizine (ZYRTEC) 10 MG tablet Take 10 mg by mouth daily as needed.    [provider]  cholecalciferol (VITAMIN D) 400 units TABS tablet Take 1,000 Units by mouth daily.    [provider]  ciprofloxacin-dexamethasone (CIPRODEX) OTIC suspension Place 4 drops into the left ear 2 (two) times daily. 08/09/22   [provider]  [facility] Liver Oil w/Vit A & D CAPS Take 1 capsule by mouth daily.    [provider]   empagliflozin (JARDIANCE) 10 MG TABS tablet Take 1 tablet (10 mg total) by mouth daily before breakfast. 09/11/22   Delma Freeze, FNP  feeding supplement (ENSURE ENLIVE / ENSURE PLUS) LIQD Take 237 mLs by mouth 3 (three) times daily between meals. 08/11/21   Alford Highland, MD  ferrous gluconate (FERGON) 324 MG tablet Take 324 mg by mouth daily with breakfast.    [provider]  glimepiride (AMARYL) 2 MG tablet Take 2 mg by mouth daily with breakfast.    [provider]  isosorbide mononitrate (IMDUR) 30 MG 24 hr tablet Take 1 tablet by mouth daily. 02/05/22   [provider]  levocetirizine (XYZAL) 5 MG tablet Take by mouth every evening.    [provider]  metFORMIN (GLUCOPHAGE) 500 MG tablet Take  1 tablet by mouth 2 (two) times daily with a meal. 02/21/22   [provider]  metoprolol succinate (TOPROL-XL) 25 MG 24 hr tablet Take 1 tablet (25 mg total) by mouth daily. 08/11/21   Alford Highland, MD  montelukast (SINGULAIR) 10 MG tablet Take 1 tablet by mouth daily. 02/21/22   [provider]  Multiple Vitamin (MULTIVITAMIN) tablet Take 1 tablet by mouth daily.     [provider]  pantoprazole (PROTONIX) 40 MG tablet Take 40 mg by mouth 2 (two) times daily.    [provider]  polyethylene glycol (MIRALAX / GLYCOLAX) 17 g packet Take 17 g by mouth daily as needed for severe constipation. 08/11/21   Alford Highland, MD  rosuvastatin (CRESTOR) 20 MG tablet Take 1 tablet by mouth daily. 05/18/21   [provider]  sacubitril-valsartan (ENTRESTO) 49-51 MG Take 1 tablet by mouth 2 (two) times daily. 08/16/22   Delma Freeze, FNP  spironolactone (ALDACTONE) 25 MG tablet Take 0.5 tablets (12.5 mg total) by mouth daily. 09/12/22 12/11/22  Delma Freeze, FNP  sucralfate (CARAFATE) 1 g tablet Take 1 tablet by mouth 2 (two) times daily before a meal. 07/18/21   [provider]  traMADol-acetaminophen (ULTRACET)  37.5-325 MG tablet Take 1 tablet by mouth every 8 (eight) hours as needed. 07/18/21   [provider]  traZODone (DESYREL) 100 MG tablet Take 0.5 tablets by mouth at bedtime. 06/16/21   [provider]    Physical Exam: Vitals:   01/27/23 2033 01/27/23 2035 01/28/23 0020 01/28/23 0023  BP:  (!) 163/90 (!) 149/78 (!) 149/78  Pulse:  (!) 121 97 (!) 103  Resp:  20 18 20   Temp:  98 F (36.7 C)  98.1 F (36.7 C)  TempSrc:  Oral  Oral  SpO2:  98% 98%   Weight: 72.6 kg      Physical Exam Vitals and nursing note reviewed.  Constitutional:      General: She is not in acute distress. HENT:     Head: Normocephalic and atraumatic.  Cardiovascular:     Rate and Rhythm: Regular rhythm. Tachycardia present.     Heart sounds: Normal heart sounds.  Pulmonary:     Effort: Pulmonary effort is normal.     Breath sounds: Normal breath sounds.  Abdominal:     Palpations: Abdomen is soft.     Tenderness: There is no abdominal tenderness.  Neurological:     Mental Status: Mental status is at baseline.     Labs on Admission: I have personally reviewed following labs and imaging studies  CBC: Recent Labs  Lab 01/27/23 2037  WBC 8.7  NEUTROABS 5.6  HGB 12.7  HCT 39.8  MCV 100.5*  PLT 216   Basic Metabolic Panel: Recent Labs  Lab 01/27/23 2037  NA 138  K 4.1  CL 106  CO2 22  GLUCOSE 172*  BUN 25*  CREATININE 1.33*  CALCIUM 10.1  MG 1.8   GFR: Estimated Creatinine Clearance: 32.8 mL/min (A) (by C-G formula based on SCr of 1.33 mg/dL (H)). Liver Function Tests: Recent Labs  Lab 01/27/23 2037  AST 19  ALT 14  ALKPHOS 44  BILITOT 0.7  PROT 7.7  ALBUMIN 4.5   No results for input(s): "LIPASE", "AMYLASE" in the last 168 hours. No results for input(s): "AMMONIA" in the last 168 hours. Coagulation Profile: No results for input(s): "INR", "PROTIME" in the last 168 hours. Cardiac Enzymes: No results for input(s): "CKTOTAL", "CKMB", "CKMBINDEX", "  TROPONINI"  in the last 168 hours. BNP (last 3 results) No results for input(s): "PROBNP" in the last 8760 hours. HbA1C: No results for input(s): "HGBA1C" in the last 72 hours. CBG: No results for input(s): "GLUCAP" in the last 168 hours. Lipid Profile: No results for input(s): "CHOL", "HDL", "LDLCALC", "TRIG", "CHOLHDL", "LDLDIRECT" in the last 72 hours. Thyroid Function Tests: No results for input(s): "TSH", "T4TOTAL", "FREET4", "T3FREE", "THYROIDAB" in the last 72 hours. Anemia Panel: No results for input(s): "VITAMINB12", "FOLATE", "FERRITIN", "TIBC", "IRON", "RETICCTPCT" in the last 72 hours. Urine analysis:    Component Value Date/Time   COLORURINE YELLOW (A) 07/30/2021 1820   APPEARANCEUR CLEAR (A) 07/30/2021 1820   APPEARANCEUR Clear 08/12/2013 1348   LABSPEC 1.019 07/30/2021 1820   LABSPEC 1.021 08/12/2013 1348   PHURINE 5.0 07/30/2021 1820   GLUCOSEU NEGATIVE 07/30/2021 1820   GLUCOSEU Negative 08/12/2013 1348   HGBUR NEGATIVE 07/30/2021 1820   BILIRUBINUR NEGATIVE 07/30/2021 1820   BILIRUBINUR Negative 08/12/2013 1348   KETONESUR NEGATIVE 07/30/2021 1820   PROTEINUR 100 (A) 07/30/2021 1820   NITRITE NEGATIVE 07/30/2021 1820   LEUKOCYTESUR NEGATIVE 07/30/2021 1820   LEUKOCYTESUR Negative 08/12/2013 1348    Radiological Exams on Admission: DG Chest 2 View  Result Date: 01/27/2023 CLINICAL DATA:  Shortness of breath EXAM: CHEST - 2 VIEW COMPARISON:  07/31/2021 FINDINGS: Lungs are clear.  No pleural effusion or pneumothorax. Heart is normal size.  Thoracic aortic atherosclerosis. Visualized osseous structures are within normal limits. IMPRESSION: Normal chest radiographs. Electronically Signed   By: Charline Bills M.D.   On: 01/27/2023 21:06     Data Reviewed: Relevant notes from primary care and specialist visits, past discharge summaries as available in EHR, including Care Everywhere. Prior diagnostic testing as pertinent to current admission diagnoses Updated medications  and problem lists for reconciliation ED course, including vitals, labs, imaging, treatment and response to treatment Triage notes, nursing and pharmacy notes and ED provider's notes Notable results as noted in HPI   Assessment and Plan: * NSTEMI (non-ST elevated myocardial infarction) (HCC) LBBB Patient with chest pain, mild troponin bump 28--> 57, possible type II NSTEMI Continue heparin infusion for now pending cardiology consult Continue home aspirin, Imdur, metoprolol and Crestor NTG SL as needed chest pain Echocardiogram to evaluate for wall motion abnormality Cardiology consult  Acute on chronic systolic CHF (congestive heart failure) (HCC) Clinically euvolemic with BNP 264 and clear chest x-ray Will give one-time Lasix Continue home Entresto, metoprolol and spironolactone, Jardiance  COPD with acute bronchitis (HCC) Received DuoNebs in the ED and with EMS with partial relief DuoNebs scheduled and as needed Steroids Antitussives, flutter valve  Stage 3b chronic kidney disease (HCC) At baseline  Sleep apnea CPAP nightly  Diabetes mellitus, type 2 (HCC) Sliding scale insulin coverage  Essential hypertension Continue home meds     DVT prophylaxis: Lovenox  Consults: [facility] cardiology  Advance Care Planning:   Code Status: Prior   Family Communication: none  Disposition Plan: Back to previous home environment  Severity of Illness: The appropriate patient status for this patient is OBSERVATION. Observation status is judged to be reasonable and necessary in order to provide the required intensity of service to ensure the patient's safety. The patient's presenting symptoms, physical exam findings, and initial radiographic and laboratory data in the context of their medical condition is felt to place them at decreased risk for further clinical deterioration. Furthermore, it is anticipated that the patient will be medically stable for discharge from the hospital  within 2  midnights of admission.   Author: Andris Baumann, MD 01/28/2023 1:54 AM  For on call review [email or web address].

## 2023-01-28 NOTE — Assessment & Plan Note (Signed)
Received DuoNebs in the ED and with EMS with partial relief DuoNebs scheduled and as needed Steroids Antitussives, flutter valve

## 2023-01-28 NOTE — Assessment & Plan Note (Addendum)
LBBB Patient with chest pain, mild troponin bump 28--> 57, possible type II NSTEMI Continue heparin infusion for now pending cardiology consult Continue home aspirin, Imdur, metoprolol and Crestor NTG SL as needed chest pain Echocardiogram to evaluate for wall motion abnormality Cardiology consult

## 2023-01-28 NOTE — ED Notes (Signed)
Pt placed on CCM, call bell within reach, NAD. Snack box provided, no other needs at this time.

## 2023-01-28 NOTE — TOC Initial Note (Signed)
Transition of Care [facility]) - Initial/Assessment Note    Patient Details  Name: Diane Patton MRN: [id number] Date of Birth: Nov 10, 1945  Transition of Care [facility]) CM/SW Contact:    Marquita Palms, LCSW Phone Number: 01/28/2023, 5:19 PM  Clinical Narrative:                  CSW acknowledges consult for Skilled Nursing Facility.       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   Pneumococcal bacteremia 08/09/2021   Pseudogout 08/09/2021   Cardiomyopathy, nonischemic (HCC)    Septic shock (HCC)    Stage 3b chronic kidney disease (HCC)    Acute metabolic encephalopathy    Acute respiratory failure with hypoxia (HCC)    Anemia in chronic kidney disease 08/25/2020   IDA (iron deficiency anemia) 04/05/2020   Gastroenteritis 10/31/2018   Acute gastroenteritis 10/30/2018   Sleep apnea 01/19/2016   Insomnia 03/21/2015   Cough 11/01/2014   Diabetes mellitus, type 2 (HCC) 09/29/2014   Chronic obstructive pulmonary disease (HCC) 09/29/2014   Acute on chronic systolic CHF (congestive heart failure) (HCC) 06/09/2014   HYPERLIPIDEMIA-MIXED 12/16/2008   Essential hypertension 12/16/2008   PCP:  Barbette Reichmann, MD Pharmacy:   [facility] [address]), [place] - [address]) [place] [address] Phone: [phone] Fax:  [phone]     Social Determinants of Health (SDOH) Social History: SDOH Screenings   Food Insecurity: No Food Insecurity (10/02/2022)   Received from [facility]  Transportation Needs: No Transportation Needs (01/28/2023)  Utilities: Not At Risk (10/02/2022)   Received from [facility]  Alcohol Screen: Low Risk  (01/28/2023)  Depression (PHQ2-9): Low Risk  (11/28/2021)  Financial Resource Strain: Low Risk  (10/02/2022)   Received from [facility]  Tobacco Use: Medium Risk (01/28/2023)   SDOH Interventions: Transportation Interventions: Intervention Not Indicated Alcohol Usage Interventions: Intervention Not Indicated (Score <7)   Readmission Risk Interventions     No data to display

## 2023-01-28 NOTE — ED Notes (Signed)
Grandchildren in room visiting.

## 2023-01-28 NOTE — Assessment & Plan Note (Signed)
At baseline 

## 2023-01-28 NOTE — Progress Notes (Signed)
PHARMACY - ANTICOAGULATION CONSULT NOTE  Pharmacy Consult for Heparin Indication: chest pain/ACS  Allergies  Allergen Reactions   Latex Itching and Swelling    gloves   Salicylates    Allegra [Fexofenadine] Cough   Amoxicillin Other (See Comments)    GI distress   Aspirin Diarrhea and Nausea And Vomiting    Can use coated aspirin   Celebrex [Celecoxib] Nausea Only   Penicillins Other (See Comments)    GI distress    Patient Measurements: Weight: 72.6 kg (160 lb) Heparin Dosing Weight: 67.6 kg   Vital Signs: Temp: 98.1 F (36.7 C) (12/09 0023) Temp Source: Oral (12/09 0023) BP: 149/78 (12/09 0023) Pulse Rate: 103 (12/09 0023)  Labs: Recent Labs    01/27/23 2037 01/27/23 2351  HGB 12.7  --   HCT 39.8  --   PLT 216  --   CREATININE 1.33*  --   TROPONINIHS 28* 57*    Estimated Creatinine Clearance: 32.8 mL/min (A) (by C-G formula based on SCr of 1.33 mg/dL (H)).   Medical History: Past Medical History:  Diagnosis Date   Asthma    Cardiomyopathy, nonischemic (HCC)    CHF (congestive heart failure) (HCC)    COPD (chronic obstructive pulmonary disease) (HCC)    Diabetes mellitus, type 2 (HCC)    Full dentures    Gastric ulcer without hemorrhage or perforation    GERD (gastroesophageal reflux disease)    HOH (hard of hearing)    Right Ear   HTN (hypertension)    Hyperlipidemia    IDA (iron deficiency anemia) 04/05/2020   Obstructive sleep apnea on CPAP    Not using CPAP   Osteoarthritis    osteo of both knees   Scoliosis    Seasonal allergies     Medications:  (Not in a hospital admission)   Assessment: Pharmacy consulted to dose heparin in this 77 year old female admitted with ACS/NSTEMI.  No prior anticoag noted.   CrCl = 32.8 ml/min   Goal of Therapy:  Heparin level 0.3-0.7 units/ml Monitor platelets by anticoagulation protocol: Yes   Plan:  Give 4000 units bolus x 1 Start heparin infusion at 800 units/hr Check anti-Xa level in 8 hours  and daily while on heparin Continue to monitor H&H and platelets  Diane Patton D 01/28/2023,2:00 AM

## 2023-01-28 NOTE — Progress Notes (Signed)
Patient seen and examined at the bedside in the emergency department.  She has no complaints.  She feels much better.  Shortness of breath, wheezing and chest pain have resolved.  Cough is better.  Briefly, Ms. Diane Patton is a 77 year old woman with medical history significant for type II DM, hypertension, COPD, chronic systolic CHF, CKD stage IIIb, sleep apnea, who presented to the hospital with productive cough, shortness of breath, wheezing and chest pain.  She was admitted to the hospital for CHF exacerbation and COPD exacerbation.  She was treated with IV Lasix, steroids and bronchodilators.  Mildly elevated troponins (28-57) attributed to demand ischemia.  BNP was 264.5.  D-dimer was negative.  She was evaluated by the cardiologist as well.  Plan to discharge home tomorrow if she continues to improve.

## 2023-01-28 NOTE — Assessment & Plan Note (Addendum)
Clinically euvolemic with BNP 264 and clear chest x-ray Will give one-time Lasix Continue home Entresto, metoprolol and spironolactone, Jardiance

## 2023-01-28 NOTE — Progress Notes (Signed)
Education Assessment and Provision:  Detailed education reviewed with patient and instructions provided on heart failure disease management including the following:  Signs and symptoms of Heart Failure When to call the physician Importance of daily weights Low sodium diet Fluid restriction Medication management Anticipated future follow-up appointments-established pt with [facility].  Scheduled appointment 02/05/23 @ 1:00 PM for post hospital visit. She also has a scheduled follow-up with [facility] on 02/14/23 with Dr. Marge Duncans.  Patient education given on each of the above topics.  Patient acknowledges understanding via teach back method and acceptance of all instructions.  Education Materials:  "Living Better With Heart Failure" Booklet, HF zone tool, & Daily Weight Tracker Tool.  Patient has scale at home: Yes-pt admitted that she was not doing daily weights.  Encouraged her to start doing that.  Patient has pill box at home: Vickii Penna is currently using one for medication administration.    Roxy Horseman, RN, BSN [facility] Heart Failure Navigator Secure Chat Only

## 2023-01-29 DIAGNOSIS — I5023 Acute on chronic systolic (congestive) heart failure: Secondary | ICD-10-CM

## 2023-01-29 LAB — BASIC METABOLIC PANEL
Anion gap: 10 (ref 5–15)
BUN: 42 mg/dL — ABNORMAL HIGH (ref 8–23)
CO2: 25 mmol/L (ref 22–32)
Calcium: 9.5 mg/dL (ref 8.9–10.3)
Chloride: 103 mmol/L (ref 98–111)
Creatinine, Ser: 1.66 mg/dL — ABNORMAL HIGH (ref 0.44–1.00)
GFR, Estimated: 31 mL/min — ABNORMAL LOW (ref >60)
Glucose, Bld: 161 mg/dL — ABNORMAL HIGH (ref 70–99)
Potassium: 3.8 mmol/L (ref 3.5–5.1)
Sodium: 138 mmol/L (ref 135–145)

## 2023-01-29 LAB — GLUCOSE, CAPILLARY
Glucose-Capillary: 171 mg/dL — ABNORMAL HIGH (ref 70–99)
Glucose-Capillary: 212 mg/dL — ABNORMAL HIGH (ref 70–99)
Glucose-Capillary: 148 mg/dL — ABNORMAL HIGH (ref 70–99)

## 2023-01-29 LAB — LIPOPROTEIN A (LPA): Lipoprotein (a): <8.4 nmol/L (ref <75.0)

## 2023-01-29 MED ORDER — HYDRALAZINE HCL 25 MG PO TABS
25.0000 mg | ORAL_TABLET | Freq: Two times a day (BID) | ORAL | Status: DC
  Administered 2023-01-29: 25 mg via ORAL
  Filled 2023-01-29: qty 1

## 2023-01-29 MED ORDER — ENOXAPARIN SODIUM 30 MG/0.3ML IJ SOSY: 30.0000 mg | PREFILLED_SYRINGE | INTRAMUSCULAR | Status: DC

## 2023-01-29 NOTE — Discharge Summary (Signed)
Physician Discharge Summary   Patient: Diane Patton MRN: [id number] DOB: Apr 21, 1945  Admit date:     01/27/2023  Discharge date: 01/29/23  Discharge Physician: Lurene Shadow   PCP: Barbette Reichmann, MD   Recommendations at discharge:   Follow-up with PCP on 02/05/2023 as scheduled  Discharge Diagnoses: Principal Problem:   Acute on chronic systolic CHF (congestive heart failure) (HCC) Active Problems:   COPD with acute bronchitis (HCC)   Essential hypertension   Diabetes mellitus, type 2 (HCC)   Sleep apnea   Stage 3b chronic kidney disease (HCC)   Demand ischemia (HCC)   NSTEMI (non-ST elevated myocardial infarction) (HCC)  Resolved Problems:   * No resolved hospital problems. *  Hospital Course:  Ms. Bobbe Pilz is a 77 year old woman with medical history significant for type II DM, hypertension, COPD, chronic systolic CHF, CKD stage IIIb, sleep apnea, who presented to the hospital with productive cough, shortness of breath, wheezing and chest pain. She was admitted to the hospital for CHF exacerbation and COPD exacerbation. She was treated with IV Lasix, steroids and bronchodilators. Mildly elevated troponins (28-57) attributed to demand ischemia. BNP was 264.5. D-dimer was negative.   Assessment and Plan:  Acute on chronic systolic CHF: Improved.  Continue spironolactone, Entresto, Toprol-XL, Imdur and hydralazine.   COPD exacerbation: Improved.  Continue bronchodilators.   Elevated troponins: This is due to demand ischemia.   CKD stage IIIb: Creatinine trended up slightly but GFR still within stage IIIb.  Outpatient follow-up with PCP   Type II DM: Continue empagliflozin and metformin   General weakness: PT recommended home health therapy.   Comorbidities include chronic left bundle branch block, hyperlipidemia, obstructive sleep apnea   Her condition has improved and she is deemed stable for discharge to home today.      Consultants:  Cardiologist Procedures performed: None Disposition: Home health Diet recommendation:  Discharge Diet Orders (From admission, onward)     Start     Ordered   01/29/23 0000  Diet - low sodium heart healthy        01/29/23 1437           Cardiac and Carb modified diet DISCHARGE MEDICATION: Allergies as of 01/29/2023       Reactions   Latex Itching, Swelling   gloves   Salicylates    Allegra [fexofenadine] Cough   Amoxicillin Other (See Comments)   GI distress   Aspirin Diarrhea, Nausea And Vomiting   Can use coated aspirin   Celebrex [celecoxib] Nausea Only   Penicillins Other (See Comments)   GI distress        Medication List     TAKE these medications    albuterol 108 (90 Base) MCG/ACT inhaler Commonly known as: VENTOLIN HFA Inhale 2 puffs into the lungs every 6 (six) hours as needed for wheezing or shortness of breath.   ascorbic acid 500 MG tablet Commonly known as: VITAMIN C Take 500 mg by mouth daily.   aspirin 81 MG chewable tablet Chew by mouth daily.   benzonatate 200 MG capsule Commonly known as: TESSALON Take 200 mg by mouth 3 (three) times daily as needed for cough.   calcium carbonate 600 MG Tabs tablet Commonly known as: OS-CAL Take 600 mg by mouth 2 (two) times daily with a meal.   cetirizine 10 MG tablet Commonly known as: ZYRTEC Take 10 mg by mouth daily.   cholecalciferol 10 MCG (400 UNIT) Tabs tablet Commonly known as: VITAMIN D3 Take 1,000 Units by  mouth daily.   Cod Liver Oil w/Vit A & D Caps Take 1 capsule by mouth daily.   empagliflozin 10 MG Tabs tablet Commonly known as: Jardiance Take 1 tablet (10 mg total) by mouth daily before breakfast.   Entresto 49-51 MG Generic drug: sacubitril-valsartan Take 1 tablet by mouth 2 (two) times daily.   feeding supplement Liqd Take 237 mLs by mouth 3 (three) times daily between meals.   ferrous gluconate 324 MG tablet Commonly known as: FERGON Take 324 mg by mouth daily with  breakfast.   hydrALAZINE 25 MG tablet Commonly known as: APRESOLINE Take 25 mg by mouth 2 (two) times daily.   isosorbide mononitrate 30 MG 24 hr tablet Commonly known as: IMDUR Take 30 mg by mouth daily.   levocetirizine 5 MG tablet Commonly known as: XYZAL Take 5 mg by mouth every evening.   metFORMIN 500 MG tablet Commonly known as: GLUCOPHAGE Take 500 mg by mouth 2 (two) times daily with a meal.   metoprolol succinate 25 MG 24 hr tablet Commonly known as: TOPROL-XL Take 1 tablet (25 mg total) by mouth daily.   montelukast 10 MG tablet Commonly known as: SINGULAIR Take 10 mg by mouth daily.   multivitamin tablet Take 1 tablet by mouth daily.   polyethylene glycol 17 g packet Commonly known as: MIRALAX / GLYCOLAX Take 17 g by mouth daily as needed for severe constipation.   potassium chloride 10 MEQ tablet Commonly known as: KLOR-CON M Take 10 mEq by mouth 2 (two) times daily.   rosuvastatin 20 MG tablet Commonly known as: CRESTOR Take 20 mg by mouth at bedtime.   spironolactone 25 MG tablet Commonly known as: ALDACTONE Take 0.5 tablets (12.5 mg total) by mouth daily.   traMADol-acetaminophen 37.5-325 MG tablet Commonly known as: ULTRACET Take 1 tablet by mouth 2 (two) times daily as needed for moderate pain (pain score 4-6).   traZODone 100 MG tablet Commonly known as: DESYREL Take 100 mg by mouth at bedtime.        Follow-up Information     Leanora Ivanoff, [place]. Go in 1 week(s).   Contact information: [address] [facility] [place] [address] [phone]         Delma Freeze, FNP. Go in 8 day(s).   Specialty: Family Medicine Why: Hospital Follow-Up 02/05/23 @1 :00 PM Please bring all mediations with you to appointment [facility] , [address], [facility] Floor Free Valet Parking at the door Contact information: [address] [place] [address] [phone]                Discharge  Exam: Ceasar Mons Weights   01/27/23 2033  Weight: 72.6 kg   GEN: NAD SKIN: Warm and dry EYES: No pallor or icterus ENT: MMM CV: RRR PULM: CTA B ABD: soft, ND, NT, +BS CNS: AAO x 3, non focal EXT: No edema or tenderness   Condition at discharge: good  The results of significant diagnostics from this hospitalization (including imaging, microbiology, ancillary and laboratory) are listed below for reference.   Imaging Studies: ECHOCARDIOGRAM COMPLETE  Result Date: 01/28/2023    ECHOCARDIOGRAM REPORT   Patient Name:   BETHENNY Kulaga Date of Exam: 01/28/2023 Medical Rec #:  [id number]   Height:       63.0 in Accession #:    [id number]  Weight:       160.0 lb Date of Birth:  10/18/1945    BSA:  1.759 m Patient Age:    77 years    BP:           130/65 mmHg Patient Gender: F           HR:           107 bpm. Exam Location:  [facility] Procedure: 2D Echo, Cardiac Doppler, Color Doppler and Intracardiac            Opacification Agent Indications:     NSTEMI  History:         Patient has prior history of Echocardiogram examinations, most                  recent 09/04/2022. CHF and Cardiomyopathy, Acute MI, COPD; Risk                  Factors:Hypertension, Sleep Apnea, Diabetes and Dyslipidemia.                  CKD.  Sonographer:     Mikki Harbor Referring Phys:  [id number] Andris Baumann Diagnosing Phys: Alwyn Pea MD  Sonographer Comments: Technically difficult study due to poor echo windows and suboptimal parasternal window. Image acquisition challenging due to respiratory motion. IMPRESSIONS  1. Ant/apical hypo. Left ventricular ejection fraction, by estimation, is 40 to 45%. The left ventricle has mildly decreased function. The left ventricle demonstrates global hypokinesis. There is mild concentric left ventricular hypertrophy. Left ventricular diastolic function could not be evaluated.  2. Right ventricular systolic function is normal. The right ventricular size is normal. There is mildly elevated  pulmonary artery systolic pressure.  3. The mitral valve is normal in structure. No evidence of mitral valve regurgitation.  4. The aortic valve is normal in structure. Aortic valve regurgitation is not visualized. FINDINGS  Left Ventricle: Ant/apical hypo. Left ventricular ejection fraction, by estimation, is 40 to 45%. The left ventricle has mildly decreased function. The left ventricle demonstrates global hypokinesis. Definity contrast agent was given IV to delineate the  left ventricular endocardial borders. The left ventricular internal cavity size was normal in size. There is mild concentric left ventricular hypertrophy. Abnormal (paradoxical) septal motion, consistent with left bundle branch block. Left ventricular diastolic function could not be evaluated. Right Ventricle: The right ventricular size is normal. No increase in right ventricular wall thickness. Right ventricular systolic function is normal. There is mildly elevated pulmonary artery systolic pressure. The tricuspid regurgitant velocity is 2.92  m/s, and with an assumed right atrial pressure of 8 mmHg, the estimated right ventricular systolic pressure is 42.1 mmHg. Left Atrium: Left atrial size was normal in size. Right Atrium: Right atrial size was normal in size. Pericardium: There is no evidence of pericardial effusion. Mitral Valve: The mitral valve is normal in structure. No evidence of mitral valve regurgitation. MV peak gradient, 13.4 mmHg. The mean mitral valve gradient is 4.0 mmHg. Tricuspid Valve: The tricuspid valve is normal in structure. Tricuspid valve regurgitation is not demonstrated. Aortic Valve: The aortic valve is normal in structure. Aortic valve regurgitation is not visualized. Aortic valve mean gradient measures 6.0 mmHg. Aortic valve peak gradient measures 11.0 mmHg. Aortic valve area, by VTI measures 1.98 cm. Pulmonic Valve: The pulmonic valve was normal in structure. Pulmonic valve regurgitation is not visualized.  Aorta: The ascending aorta was not well visualized. IAS/Shunts: No atrial level shunt detected by color flow Doppler.  LEFT VENTRICLE PLAX 2D LVIDd:         4.30 cm LVIDs:  3.10 cm LV PW:         1.40 cm LV IVS:        1.40 cm LVOT diam:     1.90 cm LV SV:         56 LV SV Index:   32 LVOT Area:     2.84 cm  RIGHT VENTRICLE RV Basal diam:  2.90 cm RV Mid diam:    2.00 cm RV S prime:     17.40 cm/s LEFT ATRIUM             Index        RIGHT ATRIUM           Index LA diam:        3.00 cm 1.71 cm/m   RA Area:     14.20 cm LA Vol (A2C):   42.7 ml 24.28 ml/m  RA Volume:   31.70 ml  18.02 ml/m LA Vol (A4C):   46.7 ml 26.55 ml/m LA Biplane Vol: 44.9 ml 25.53 ml/m  AORTIC VALVE AV Area (Vmax):    2.07 cm AV Area (Vmean):   1.84 cm AV Area (VTI):     1.98 cm AV Vmax:           166.00 cm/s AV Vmean:          111.367 cm/s AV VTI:            0.284 m AV Peak Grad:      11.0 mmHg AV Mean Grad:      6.0 mmHg LVOT Vmax:         121.00 cm/s LVOT Vmean:        72.100 cm/s LVOT VTI:          0.198 m LVOT/AV VTI ratio: 0.70  AORTA Ao Root diam: 2.70 cm MITRAL VALVE                TRICUSPID VALVE MV Area (PHT): 5.62 cm     TR Peak grad:   34.1 mmHg MV Area VTI:   2.83 cm     TR Vmax:        292.00 cm/s MV Peak grad:  13.4 mmHg MV Mean grad:  4.0 mmHg     SHUNTS MV Vmax:       1.83 m/s     Systemic VTI:  0.20 m MV Vmean:      86.9 cm/s    Systemic Diam: 1.90 cm MV Decel Time: 135 msec MV E velocity: 149.00 cm/s Alwyn Pea MD Electronically signed by Alwyn Pea MD Signature Date/Time: 01/28/2023/4:41:38 PM    Final    DG Chest 2 View  Result Date: 01/27/2023 CLINICAL DATA:  Shortness of breath EXAM: CHEST - 2 VIEW COMPARISON:  07/31/2021 FINDINGS: Lungs are clear.  No pleural effusion or pneumothorax. Heart is normal size.  Thoracic aortic atherosclerosis. Visualized osseous structures are within normal limits. IMPRESSION: Normal chest radiographs. Electronically Signed   By: Charline Bills M.D.    On: 01/27/2023 21:06    Microbiology: Results for orders placed or performed during the hospital encounter of 01/27/23  Resp panel by RT-PCR (RSV, Flu A&B, Covid) Anterior Nasal Swab     Status: None   Collection Time: 01/27/23  8:37 PM   Specimen: Anterior Nasal Swab  Result Value Ref Range Status   SARS Coronavirus 2 by RT PCR NEGATIVE NEGATIVE Final    Comment: (NOTE) SARS-CoV-2 target nucleic acids are NOT DETECTED.  The SARS-CoV-2  RNA is generally detectable in upper respiratory specimens during the acute phase of infection. The lowest concentration of SARS-CoV-2 viral copies this assay can detect is 138 copies/mL. A negative result does not preclude SARS-Cov-2 infection and should not be used as the sole basis for treatment or other patient management decisions. A negative result may occur with  improper specimen collection/handling, submission of specimen other than nasopharyngeal swab, presence of viral mutation(s) within the areas targeted by this assay, and inadequate number of viral copies(<138 copies/mL). A negative result must be combined with clinical observations, patient history, and epidemiological information. The expected result is Negative.  Fact Sheet for Patients:  BloggerCourse.com  Fact Sheet for Healthcare Providers:  SeriousBroker.it  This test is no t yet approved or cleared by the [place] FDA and  has been authorized for detection and/or diagnosis of SARS-CoV-2 by FDA under an Emergency Use Authorization (EUA). This EUA will remain  in effect (meaning this test can be used) for the duration of the COVID-19 declaration under Section 564(b)(1) of the Act, 21 U.S.C.section 360bbb-3(b)(1), unless the authorization is terminated  or revoked sooner.       Influenza A by PCR NEGATIVE NEGATIVE Final   Influenza B by PCR NEGATIVE NEGATIVE Final    Comment: (NOTE) The Xpert Xpress SARS-CoV-2/FLU/RSV  plus assay is intended as an aid in the diagnosis of influenza from Nasopharyngeal swab specimens and should not be used as a sole basis for treatment. Nasal washings and aspirates are unacceptable for Xpert Xpress SARS-CoV-2/FLU/RSV testing.  Fact Sheet for Patients: BloggerCourse.com  Fact Sheet for Healthcare Providers: SeriousBroker.it  This test is not yet approved or cleared by the [place] FDA and has been authorized for detection and/or diagnosis of SARS-CoV-2 by FDA under an Emergency Use Authorization (EUA). This EUA will remain in effect (meaning this test can be used) for the duration of the COVID-19 declaration under Section 564(b)(1) of the Act, 21 U.S.C. section 360bbb-3(b)(1), unless the authorization is terminated or revoked.     Resp Syncytial Virus by PCR NEGATIVE NEGATIVE Final    Comment: (NOTE) Fact Sheet for Patients: BloggerCourse.com  Fact Sheet for Healthcare Providers: SeriousBroker.it  This test is not yet approved or cleared by the [place] FDA and has been authorized for detection and/or diagnosis of SARS-CoV-2 by FDA under an Emergency Use Authorization (EUA). This EUA will remain in effect (meaning this test can be used) for the duration of the COVID-19 declaration under Section 564(b)(1) of the Act, 21 U.S.C. section 360bbb-3(b)(1), unless the authorization is terminated or revoked.  Performed at [facility], [address] [place], [place] [address]     Labs: CBC: Recent Labs  Lab 01/27/23 2037  WBC 8.7  NEUTROABS 5.6  HGB 12.7  HCT 39.8  MCV 100.5*  PLT 216   Basic Metabolic Panel: Recent Labs  Lab 01/27/23 2037 01/29/23 0308  NA 138 138  K 4.1 3.8  CL 106 103  CO2 22 25  GLUCOSE 172* 161*  BUN 25* 42*  CREATININE 1.33* 1.66*  CALCIUM 10.1 9.5  MG 1.8  --    Liver Function Tests: Recent Labs   Lab 01/27/23 2037  AST 19  ALT 14  ALKPHOS 44  BILITOT 0.7  PROT 7.7  ALBUMIN 4.5   CBG: Recent Labs  Lab 01/28/23 1155 01/28/23 1934 01/28/23 2216 01/29/23 0902 01/29/23 1342  GLUCAP 248* 252* 275* 171* 212*    Discharge time spent: greater than 30 minutes.  Signed: Lurene Shadow, MD [facility] 01/29/2023

## 2023-01-29 NOTE — Evaluation (Signed)
Occupational Therapy Evaluation Patient Details Name: Diane Patton MRN: [id number] DOB: 01-Sep-1945 Today's Date: 01/29/2023   History of Present Illness Pt is a 77 y.o. female  with a past medical history of chronic heart failure reduced EF (40-45% in 08/2022), nonischemic cardiomyopathy, hypertension, hyperlipidemia, OSA, COPD, pulmonary HTN, type II diabetes, GERD who presented to the ED on 01/27/2023 for shortness of breath workup for COPD and CHF exacerbation.   Clinical Impression   Chart reviewed, pt greeted in bed, agreeable to OT evaluation. Pt is alert and oriented x4. PTA pt reports she is MOD I in ADL, family assists with IADLs. She provides care for her husband who she reports is also hospitalized right now. Pt presents with deficits in strength, endurance, activity tolerance, balance all affecting safe and optimal ADL completion. Bed mobility completed with supervision, STS with supervision, amb in room/hallway approx 160' with supervision-CGA with and without RW, improved safety with RW. Toileting completed with supervision. Pt would benefit from ongoing OT to address deficits and to facilitate optimal ADL performance. OT will follow acutely.       If plan is discharge home, recommend the following: A little help with walking and/or transfers;A little help with bathing/dressing/bathroom;Help with stairs or ramp for entrance;Direct supervision/assist for financial management;Assist for transportation;Assistance with cooking/housework    Functional Status Assessment  Patient has had a recent decline in their functional status and demonstrates the ability to make significant improvements in function in a reasonable and predictable amount of time.  Equipment Recommendations  BSC/3in1;Other (comment) (2WW)    Recommendations for Other Services       Precautions / Restrictions Precautions Precautions: Fall Restrictions Weight Bearing Restrictions: No      Mobility Bed  Mobility Overal bed mobility: Needs Assistance Bed Mobility: Supine to Sit     Supine to sit: Supervision          Transfers Overall transfer level: Needs assistance Equipment used: Rolling walker (2 wheels) Transfers: Sit to/from Stand Sit to Stand: Supervision                  Balance Overall balance assessment: Needs assistance Sitting-balance support: Feet supported Sitting balance-Leahy Scale: Good     Standing balance support: Bilateral upper extremity supported, During functional activity Standing balance-Leahy Scale: Fair                             ADL either performed or assessed with clinical judgement   ADL Overall ADL's : Needs assistance/impaired Eating/Feeding: Set up;Sitting   Grooming: Wash/dry face;Wash/dry hands;Sitting;Set up           Upper Body Dressing : Supervision/safety;Sitting   Lower Body Dressing: Supervision/safety;Sitting/lateral leans Lower Body Dressing Details (indicate cue type and reason): socks, shoes Toilet Transfer: Supervision/safety;Rolling walker (2 wheels)   Toileting- Clothing Manipulation and Hygiene: Supervision/safety;Sitting/lateral lean       Functional mobility during ADLs: Supervision/safety;Contact guard assist;Rolling walker (2 wheels) (approx 100' with RW)       Vision Patient Visual Report: No change from baseline       Perception         Praxis         Pertinent Vitals/Pain Pain Assessment Pain Assessment: No/denies pain     Extremity/Trunk Assessment Upper Extremity Assessment Upper Extremity Assessment: Overall WFL for tasks assessed   Lower Extremity Assessment Lower Extremity Assessment: Generalized weakness       Communication Communication Communication: No apparent difficulties  Cueing Techniques: Verbal cues   Cognition Arousal: Alert Behavior During Therapy: WFL for tasks assessed/performed Overall Cognitive Status: Within Functional Limits for tasks  assessed                                       General Comments  vss throughout    Exercises Other Exercises Other Exercises: edu re: role of OT, role of rehab, discharge recommendations, home safety, falls prevention, safe ADL with DME   Shoulder Instructions      Home Living Family/patient expects to be discharged to:: Private residence Living Arrangements: Spouse/significant other;Children (son who uses a wheelchair is staying with pt right now) Available Help at Discharge: Family (husband is currently hospitalized) Type of Home: House Home Access: Stairs to enter [employer] of Steps: 2 Entrance Stairs-Rails: Right Home Layout: Multi-level;Able to live on main level with bedroom/bathroom Alternate Level Stairs-Number of Steps: 10-12; pt says she does not go upstairs in years   Bathroom Shower/Tub: Sponge bathes at baseline (pt reports shower isnt working, has a Barista)   Firefighter: Pharmacist, community: Yes   Home Equipment: Agricultural consultant (2 wheels);Cane - single point;BSC/3in1 (pt reports bsc is broken)          Prior Functioning/Environment Prior Level of Function : Independent/Modified Independent;Needs assist       Physical Assist : ADLs (physical)   ADLs (physical): IADLs Mobility Comments: amb with RW community distances, no AD in house ADLs Comments: pt grandchildren does grocery shopping, does microwave meals, sandwhiches, simple meal prep; MOD I with med mangement, drives short distances, cleans; ADL with MOD I        OT Problem List: Decreased activity tolerance;Decreased knowledge of use of DME or AE;Impaired balance (sitting and/or standing)      OT Treatment/Interventions: Self-care/ADL training;Therapeutic exercise;Patient/family education;Balance training;DME and/or AE instruction;Therapeutic activities;Energy conservation    OT Goals(Current goals can be found in the care plan section) Acute Rehab  OT Goals Patient Stated Goal: go home OT Goal Formulation: With patient Time For Goal Achievement: 02/12/23 Potential to Achieve Goals: Good  OT Frequency: Min 1X/week    Co-evaluation              AM-PAC OT "6 Clicks" Daily Activity     Outcome Measure Help from another person eating meals?: None Help from another person taking care of personal grooming?: None Help from another person toileting, which includes using toliet, bedpan, or urinal?: None Help from another person bathing (including washing, rinsing, drying)?: A Little Help from another person to put on and taking off regular upper body clothing?: None Help from another person to put on and taking off regular lower body clothing?: A Little 6 Click Score: 22   End of Session Equipment Utilized During Treatment: Gait belt;Rolling walker (2 wheels) Nurse Communication: Mobility status  Activity Tolerance: Patient tolerated treatment well Patient left: in chair;with call bell/phone within reach  OT Visit Diagnosis: Other abnormalities of gait and mobility (R26.89)                Time: 3220-2542 OT Time Calculation (min): 30 min Charges:  OT General Charges $OT Visit: 1 Visit OT Evaluation $OT Eval Low Complexity: 1 Low  Oleta Mouse, OTD OTR/L  01/29/23, 1:44 PM

## 2023-01-29 NOTE — TOC Transition Note (Signed)
Transition of Care [facility]) - Progression Note    Patient Details  Name: Wilmuth Olivetti MRN: [id number] Date of Birth: [birth year]  Transition of Care [facility]) CM/SW Contact  Truddie Hidden, RN Phone Number: 01/29/2023, 3:30 PM  Clinical Narrative:    Spoke with patient regarding therapy's recommendation for [facility] PT/OT. Patient is agreeable to [facility] PT/OT and does not have a choice of an agency. Patient advised the accepting agency will contact her directly to scheduled SOC within 48 post discharge.  Referral sent and accepted by Elnita Maxwell at [facility].  TOC signing off.            Expected Discharge Plan and Services         Expected Discharge Date: 01/29/23                                     Social Determinants of Health (SDOH) Interventions SDOH Screenings   Food Insecurity: No Food Insecurity (01/28/2023)  Housing: Patient Declined (01/28/2023)  Transportation Needs: No Transportation Needs (01/28/2023)  Utilities: Patient Declined (01/28/2023)  Alcohol Screen: Low Risk  (01/28/2023)  Depression (PHQ2-9): Low Risk  (11/28/2021)  Financial Resource Strain: Low Risk  (10/02/2022)   Received from [facility]  Tobacco Use: Medium Risk (01/28/2023)    Readmission Risk Interventions     No data to display

## 2023-01-29 NOTE — Progress Notes (Signed)
[facility] CLINIC CARDIOLOGY PROGRESS NOTE       Patient ID: Diane Patton MRN: [id number] DOB/AGE: 79/18/1947 79 y.o.  Admit date: 01/27/2023 Referring Physician Dr. Lindajo Royal Primary Physician Barbette Reichmann, MD  Primary Cardiologist Leanora Ivanoff, PA Reason for Consultation AoCHF  HPI: Diane Patton is a 77 y.o. female  with a past medical history of chronic heart failure reduced EF (40-45% in 08/2022), nonischemic cardiomyopathy, hypertension, hyperlipidemia, OSA, COPD, pulmonary HTN, type II diabetes, GERD who presented to the ED on 01/27/2023 for shortness of breath. Cardiology was consulted for further evaluation.   Interval history: -Patient reports she is feeling much better today.  -Denies any recurrence of SOB. Without LE edema.  -BP and HR remain stable. Denies any chest pain, palpitations, dizziness.  -Cr slightly up this AM, reports good UOP with diuresis.  Review of systems complete and found to be negative unless listed above    Past Medical History:  Diagnosis Date   Asthma    Cardiomyopathy, nonischemic (HCC)    CHF (congestive heart failure) (HCC)    COPD (chronic obstructive pulmonary disease) (HCC)    Diabetes mellitus, type 2 (HCC)    Full dentures    Gastric ulcer without hemorrhage or perforation    GERD (gastroesophageal reflux disease)    HOH (hard of hearing)    Right Ear   HTN (hypertension)    Hyperlipidemia    IDA (iron deficiency anemia) 04/05/2020   Obstructive sleep apnea on CPAP    Not using CPAP   Osteoarthritis    osteo of both knees   Scoliosis    Seasonal allergies     Past Surgical History:  Procedure Laterality Date   ABDOMINAL HYSTERECTOMY     ankle (other)     APPENDECTOMY     BREAST BIOPSY Right 1999   neg   BREAST CYST ASPIRATION     neg   BREAST SURGERY     CARDIAC CATHETERIZATION     CLAVICLE SURGERY Right    Fx   COLONOSCOPY WITH PROPOFOL N/A 07/10/2016   Procedure: COLONOSCOPY WITH PROPOFOL;  Surgeon: Christena Deem, MD;  Location: [facility] ENDOSCOPY;  Service: Endoscopy;  Laterality: N/A;   ear (otheR)     ESOPHAGOGASTRODUODENOSCOPY N/A 05/11/2019   Procedure: ESOPHAGOGASTRODUODENOSCOPY (EGD);  Surgeon: Toledo, Boykin Nearing, MD;  Location: [facility] ENDOSCOPY;  Service: Gastroenterology;  Laterality: N/A;   ESOPHAGOGASTRODUODENOSCOPY (EGD) WITH PROPOFOL N/A 07/10/2016   Procedure: ESOPHAGOGASTRODUODENOSCOPY (EGD) WITH PROPOFOL;  Surgeon: Christena Deem, MD;  Location: [facility] ENDOSCOPY;  Service: Endoscopy;  Laterality: N/A;   ESOPHAGOGASTRODUODENOSCOPY (EGD) WITH PROPOFOL N/A 09/17/2017   Procedure: ESOPHAGOGASTRODUODENOSCOPY (EGD) WITH PROPOFOL;  Surgeon: Christena Deem, MD;  Location: [facility] ENDOSCOPY;  Service: Endoscopy;  Laterality: N/A;   feet (other)     HERNIA REPAIR     MYRINGOTOMY WITH TUBE PLACEMENT Bilateral 03/16/2022   Procedure: MYRINGOTOMY WITH TUBE PLACEMENT WITH BUTTERFLY TUBES;  Surgeon: Linus Salmons, MD;  Location: [facility] SURGERY CNTR;  Service: ENT;  Laterality: Bilateral;   sinus (other)     stomach     UPPER ESOPHAGEAL ENDOSCOPIC ULTRASOUND (EUS) N/A 11/22/2016   Procedure: UPPER ESOPHAGEAL ENDOSCOPIC ULTRASOUND (EUS);  Surgeon: Doren Custard, MD;  Location: [facility] ENDOSCOPY;  Service: Gastroenterology;  Laterality: N/A;    Medications Prior to Admission  Medication Sig Dispense Refill Last Dose   albuterol (PROVENTIL HFA;VENTOLIN HFA) 108 (90 BASE) MCG/ACT inhaler Inhale 2 puffs into the lungs every 6 (six) hours as needed for wheezing or  shortness of breath.   Unknown at PRN   ascorbic acid (VITAMIN C) 500 MG tablet Take 500 mg by mouth daily.      aspirin 81 MG chewable tablet Chew by mouth daily.      benzonatate (TESSALON) 200 MG capsule Take 200 mg by mouth 3 (three) times daily as needed for cough.   Unknown at PRN   calcium carbonate (OS-CAL) 600 MG TABS tablet Take 600 mg by mouth 2 (two) times daily with a meal.       cetirizine (ZYRTEC) 10 MG tablet Take 10 mg by mouth  daily.      cholecalciferol (VITAMIN D) 400 units TABS tablet Take 1,000 Units by mouth daily.      Cod Liver Oil w/Vit A & D CAPS Take 1 capsule by mouth daily.      empagliflozin (JARDIANCE) 10 MG TABS tablet Take 1 tablet (10 mg total) by mouth daily before breakfast. 90 tablet 3 01/27/2023 at 0800   ferrous gluconate (FERGON) 324 MG tablet Take 324 mg by mouth daily with breakfast.      hydrALAZINE (APRESOLINE) 25 MG tablet Take 25 mg by mouth 2 (two) times daily.   01/27/2023 at 0800   isosorbide mononitrate (IMDUR) 30 MG 24 hr tablet Take 30 mg by mouth daily.   01/26/2023 at Unknown   levocetirizine (XYZAL) 5 MG tablet Take 5 mg by mouth every evening.      metFORMIN (GLUCOPHAGE) 500 MG tablet Take 500 mg by mouth 2 (two) times daily with a meal.   01/27/2023 at 0800   metoprolol succinate (TOPROL-XL) 25 MG 24 hr tablet Take 1 tablet (25 mg total) by mouth daily. 30 tablet 0 01/27/2023 at 0800   montelukast (SINGULAIR) 10 MG tablet Take 10 mg by mouth daily.   01/26/2023 at Unknown   Multiple Vitamin (MULTIVITAMIN) tablet Take 1 tablet by mouth daily.       polyethylene glycol (MIRALAX / GLYCOLAX) 17 g packet Take 17 g by mouth daily as needed for severe constipation. 14 each 0 Unknown at PRN   potassium chloride (KLOR-CON M) 10 MEQ tablet Take 10 mEq by mouth 2 (two) times daily.   01/27/2023 at 0800   rosuvastatin (CRESTOR) 20 MG tablet Take 20 mg by mouth at bedtime.   01/26/2023 at 2000   sacubitril-valsartan (ENTRESTO) 49-51 MG Take 1 tablet by mouth 2 (two) times daily. 180 tablet 1 01/27/2023 at 0800   spironolactone (ALDACTONE) 25 MG tablet Take 0.5 tablets (12.5 mg total) by mouth daily. 45 tablet 3 01/27/2023 at 0800   traMADol-acetaminophen (ULTRACET) 37.5-325 MG tablet Take 1 tablet by mouth 2 (two) times daily as needed for moderate pain (pain score 4-6).   Unknown at PRN   traZODone (DESYREL) 100 MG tablet Take 100 mg by mouth at bedtime.   Past Week at Unknown   feeding supplement  (ENSURE ENLIVE / ENSURE PLUS) LIQD Take 237 mLs by mouth 3 (three) times daily between meals. 21330 mL 0    Social History   Socioeconomic History   Marital status: Married    Spouse name: Not on file   Number of children: Not on file   Years of education: Not on file   Highest education level: Not on file  Occupational History   Occupation: retired  Tobacco Use   Smoking status: Former    Current packs/day: 0.00    Average packs/day: 1 pack/day for 15.0 years (15.0 ttl pk-yrs)    Types:  Cigarettes    Start date: 07/21/1967    Quit date: 07/21/1982    Years since quitting: 40.5   Smokeless tobacco: Never  Vaping Use   Vaping status: Never Used  Substance and Sexual Activity   Alcohol use: No    Alcohol/week: 15.0 standard drinks of alcohol    Types: 15 Standard drinks or equivalent per week    Comment: quit drinking 04/1981   Drug use: No   Sexual activity: Not on file  Other Topics Concern   Not on file  Social History Narrative   Disabled. Regularly exercises.    Social Determinants of Health   Financial Resource Strain: Low Risk  (10/02/2022)   Received from [facility]   Overall Financial Resource Strain (CARDIA)    Difficulty of Paying Living Expenses: Not hard at all  Food Insecurity: No Food Insecurity (01/28/2023)   Hunger Vital Sign    Worried About Running Out of Food in the Last Year: Never true    Ran Out of Food in the Last Year: Never true  Transportation Needs: No Transportation Needs (01/28/2023)   PRAPARE - Administrator, Civil Service (Medical): No    Lack of Transportation (Non-Medical): No  Physical Activity: Not on file  Stress: Not on file  Social Connections: Not on file  Intimate Partner Violence: Not At Risk (01/28/2023)   Humiliation, Afraid, Rape, and Kick questionnaire    Fear of Current or Ex-Partner: No    Emotionally Abused: No    Physically Abused: No    Sexually Abused: No    Family History  Problem  Relation Age of Onset   Alzheimer's disease Other    Heart attack Mother    Hypertension Mother    Cancer Brother        lung   Cancer Maternal Aunt        mouth and breast   Breast cancer Maternal Aunt    Cancer Daughter        throat   Dementia Father      Vitals:   01/28/23 2100 01/28/23 2310 01/29/23 0416 01/29/23 0901  BP: 109/62 (!) 132/54 (!) 154/93 135/64  Pulse: 89 94 91 81  Resp: 19 18 18 17   Temp:  98.4 F (36.9 C) 98.3 F (36.8 C) 98.3 F (36.8 C)  TempSrc:  Oral Oral   SpO2: 99% 98% 98% 100%  Weight:        PHYSICAL EXAM General: Well appearing elderly female, well nourished, in no acute distress sitting upright in bedside chair HEENT: Normocephalic and atraumatic. Neck: No JVD.  Lungs: Normal respiratory effort on room air. Clear bilaterally to auscultation. No wheezes, crackles, rhonchi.  Heart: HRRR. Normal S1 and S2 without gallops or murmurs.  Abdomen: Non-distended appearing.  Msk: Normal strength and tone for age. Extremities: Warm and well perfused. No clubbing, cyanosis. No edema.  Neuro: Alert and oriented X 3. Psych: Answers questions appropriately.   Labs: Basic Metabolic Panel: Recent Labs    01/27/23 2037 01/29/23 0308  NA 138 138  K 4.1 3.8  CL 106 103  CO2 22 25  GLUCOSE 172* 161*  BUN 25* 42*  CREATININE 1.33* 1.66*  CALCIUM 10.1 9.5  MG 1.8  --    Liver Function Tests: Recent Labs    01/27/23 2037  AST 19  ALT 14  ALKPHOS 44  BILITOT 0.7  PROT 7.7  ALBUMIN 4.5   No results for input(s): "LIPASE", "AMYLASE" in  the last 72 hours. CBC: Recent Labs    01/27/23 2037  WBC 8.7  NEUTROABS 5.6  HGB 12.7  HCT 39.8  MCV 100.5*  PLT 216   Cardiac Enzymes: Recent Labs    01/27/23 2037 01/27/23 2351 01/28/23 1052  TROPONINIHS 28* 57* 57*   BNP: Recent Labs    01/27/23 2037  BNP 264.5*   D-Dimer: Recent Labs    01/27/23 2351  DDIMER 0.38   Hemoglobin A1C: Recent Labs    01/28/23 0534  HGBA1C 7.3*    Fasting Lipid Panel: No results for input(s): "CHOL", "HDL", "LDLCALC", "TRIG", "CHOLHDL", "LDLDIRECT" in the last 72 hours. Thyroid Function Tests: No results for input(s): "TSH", "T4TOTAL", "T3FREE", "THYROIDAB" in the last 72 hours.  Invalid input(s): "FREET3" Anemia Panel: No results for input(s): "VITAMINB12", "FOLATE", "FERRITIN", "TIBC", "IRON", "RETICCTPCT" in the last 72 hours.   Radiology: ECHOCARDIOGRAM COMPLETE  Result Date: 01/28/2023    ECHOCARDIOGRAM REPORT   Patient Name:   Diane Patton Date of Exam: 01/28/2023 Medical Rec #:  [id number]   Height:       63.0 in Accession #:    [id number]  Weight:       160.0 lb Date of Birth:  October 09, 1945    BSA:          1.759 m Patient Age:    77 years    BP:           130/65 mmHg Patient Gender: F           HR:           107 bpm. Exam Location:  [facility] Procedure: 2D Echo, Cardiac Doppler, Color Doppler and Intracardiac            Opacification Agent Indications:     NSTEMI  History:         Patient has prior history of Echocardiogram examinations, most                  recent 09/04/2022. CHF and Cardiomyopathy, Acute MI, COPD; Risk                  Factors:Hypertension, Sleep Apnea, Diabetes and Dyslipidemia.                  CKD.  Sonographer:     Mikki Harbor Referring Phys:  [id number] Andris Baumann Diagnosing Phys: Alwyn Pea MD  Sonographer Comments: Technically difficult study due to poor echo windows and suboptimal parasternal window. Image acquisition challenging due to respiratory motion. IMPRESSIONS  1. Ant/apical hypo. Left ventricular ejection fraction, by estimation, is 40 to 45%. The left ventricle has mildly decreased function. The left ventricle demonstrates global hypokinesis. There is mild concentric left ventricular hypertrophy. Left ventricular diastolic function could not be evaluated.  2. Right ventricular systolic function is normal. The right ventricular size is normal. There is mildly elevated pulmonary artery systolic  pressure.  3. The mitral valve is normal in structure. No evidence of mitral valve regurgitation.  4. The aortic valve is normal in structure. Aortic valve regurgitation is not visualized. FINDINGS  Left Ventricle: Ant/apical hypo. Left ventricular ejection fraction, by estimation, is 40 to 45%. The left ventricle has mildly decreased function. The left ventricle demonstrates global hypokinesis. Definity contrast agent was given IV to delineate the  left ventricular endocardial borders. The left ventricular internal cavity size was normal in size. There is mild concentric left ventricular hypertrophy. Abnormal (paradoxical) septal motion, consistent with left bundle branch  block. Left ventricular diastolic function could not be evaluated. Right Ventricle: The right ventricular size is normal. No increase in right ventricular wall thickness. Right ventricular systolic function is normal. There is mildly elevated pulmonary artery systolic pressure. The tricuspid regurgitant velocity is 2.92  m/s, and with an assumed right atrial pressure of 8 mmHg, the estimated right ventricular systolic pressure is 42.1 mmHg. Left Atrium: Left atrial size was normal in size. Right Atrium: Right atrial size was normal in size. Pericardium: There is no evidence of pericardial effusion. Mitral Valve: The mitral valve is normal in structure. No evidence of mitral valve regurgitation. MV peak gradient, 13.4 mmHg. The mean mitral valve gradient is 4.0 mmHg. Tricuspid Valve: The tricuspid valve is normal in structure. Tricuspid valve regurgitation is not demonstrated. Aortic Valve: The aortic valve is normal in structure. Aortic valve regurgitation is not visualized. Aortic valve mean gradient measures 6.0 mmHg. Aortic valve peak gradient measures 11.0 mmHg. Aortic valve area, by VTI measures 1.98 cm. Pulmonic Valve: The pulmonic valve was normal in structure. Pulmonic valve regurgitation is not visualized. Aorta: The ascending aorta was  not well visualized. IAS/Shunts: No atrial level shunt detected by color flow Doppler.  LEFT VENTRICLE PLAX 2D LVIDd:         4.30 cm LVIDs:         3.10 cm LV PW:         1.40 cm LV IVS:        1.40 cm LVOT diam:     1.90 cm LV SV:         56 LV SV Index:   32 LVOT Area:     2.84 cm  RIGHT VENTRICLE RV Basal diam:  2.90 cm RV Mid diam:    2.00 cm RV S prime:     17.40 cm/s LEFT ATRIUM             Index        RIGHT ATRIUM           Index LA diam:        3.00 cm 1.71 cm/m   RA Area:     14.20 cm LA Vol (A2C):   42.7 ml 24.28 ml/m  RA Volume:   31.70 ml  18.02 ml/m LA Vol (A4C):   46.7 ml 26.55 ml/m LA Biplane Vol: 44.9 ml 25.53 ml/m  AORTIC VALVE AV Area (Vmax):    2.07 cm AV Area (Vmean):   1.84 cm AV Area (VTI):     1.98 cm AV Vmax:           166.00 cm/s AV Vmean:          111.367 cm/s AV VTI:            0.284 m AV Peak Grad:      11.0 mmHg AV Mean Grad:      6.0 mmHg LVOT Vmax:         121.00 cm/s LVOT Vmean:        72.100 cm/s LVOT VTI:          0.198 m LVOT/AV VTI ratio: 0.70  AORTA Ao Root diam: 2.70 cm MITRAL VALVE                TRICUSPID VALVE MV Area (PHT): 5.62 cm     TR Peak grad:   34.1 mmHg MV Area VTI:   2.83 cm     TR Vmax:        292.00 cm/s  MV Peak grad:  13.4 mmHg MV Mean grad:  4.0 mmHg     SHUNTS MV Vmax:       1.83 m/s     Systemic VTI:  0.20 m MV Vmean:      86.9 cm/s    Systemic Diam: 1.90 cm MV Decel Time: 135 msec MV E velocity: 149.00 cm/s Alwyn Pea MD Electronically signed by Alwyn Pea MD Signature Date/Time: 01/28/2023/4:41:38 PM    Final    DG Chest 2 View  Result Date: 01/27/2023 CLINICAL DATA:  Shortness of breath EXAM: CHEST - 2 VIEW COMPARISON:  07/31/2021 FINDINGS: Lungs are clear.  No pleural effusion or pneumothorax. Heart is normal size.  Thoracic aortic atherosclerosis. Visualized osseous structures are within normal limits. IMPRESSION: Normal chest radiographs. Electronically Signed   By: Charline Bills M.D.   On: 01/27/2023 21:06    ECHO as  above  TELEMETRY reviewed by me 01/29/2023: sinus tachycardia PVCs rate 100s  EKG reviewed by me: sinus tachycardia rate 104 bpm, PVCs, chronic LBBB  Data reviewed by me 01/29/2023: last 24h vitals tele labs imaging I/O ED provider note, admission H&P  Principal Problem:   Acute on chronic systolic CHF (congestive heart failure) (HCC) Active Problems:   Essential hypertension   Diabetes mellitus, type 2 (HCC)   Sleep apnea   Stage 3b chronic kidney disease (HCC)   COPD with acute bronchitis (HCC)   Demand ischemia (HCC)   NSTEMI (non-ST elevated myocardial infarction) (HCC)    ASSESSMENT AND PLAN:  Rakel Sumey is a 77 y.o. female  with a past medical history of chronic heart failure reduced EF (40-45% in 08/2022), nonischemic cardiomyopathy, hypertension, hyperlipidemia, OSA, COPD, pulmonary HTN, type II diabetes, GERD who presented to the ED on 01/27/2023 for shortness of breath. Cardiology was consulted for further evaluation.   # Acute on chronic HFrEF, NICM # Acute COPD exacerbation # Demand ischemia Patient presented with worsening SOB x1 day, worsening productive cough. Reports sxs similar to prior heart failure exacerbations. BNP 264. Troponins 28 > 57 > 57. CXR without significant abnormality. Echo this admission with EF 40-45% (stable from prior), global hypokinesis, mild concentric LVH, mildly elevated PASP. -Hold diuretics today. Likely start po tomorrow.  -Restart home hydralazine 25 mg twice daily. Continue entresto 49-51 mg twice daily, spironolactone 12.5 mg daily, metoprolol succinate 25 mg daily, jardiance 10 mg daily, imdur 30 mg daily.  -Mild troponin elevation most consistent with demand/supply mismatch and not ACS in the setting of acute heart failure, acute COPD exacerbation.  -Monitor and replenish electrolytes for a goal K >4, Mag >2    # Chronic kidney disease stage III Patient with hx of CKD, Cr 1.33 on admission which appears near baseline.  -Continue to  monitor renal function closely.  # Hyperlipidemia -Continue home crestor 20 mg daily and aspirin 81 mg daily.   This patient's plan of care was discussed and created with Dr. Juliann Pares and he is in agreement.  Signed: Gale Journey, PA-C  01/29/2023, 10:42 AM [facility] Cardiology

## 2023-01-29 NOTE — Evaluation (Signed)
Physical Therapy Evaluation Patient Details Name: Diane Patton MRN: [id number] DOB: 1946/01/18 Today's Date: 01/29/2023  History of Present Illness  Pt is a 77 y.o. female  with a past medical history of chronic heart failure reduced EF (40-45% in 08/2022), nonischemic cardiomyopathy, hypertension, hyperlipidemia, OSA, COPD, pulmonary HTN, type II diabetes, GERD who presented to the ED on 01/27/2023 for shortness of breath workup for COPD and CHF exacerbation.   Clinical Impression  Pt alert, agreeable to PT, oriented x4. On room air throughout mobility, spO2 >92% HR in 80s-100s with mobility. Pt stated at baseline she is modI/I, will use a RW when out of the home and provides for her husband and son, grandchildren assist with driving and grocery shopping. She was able to perform transfers with supervision and RW. Effortful to stand but able to without physical assistance. She ambulated ~242ft with RW CGA-supervision, no LOB noted. Endorsed feeling near her baseline but still weaker than normal. Agreeable to utilize RW for all mobility for now.  Overall the patient demonstrated deficits (see "PT Problem List") that impede the patient's functional abilities, safety, and mobility and would benefit from skilled PT intervention.          If plan is discharge home, recommend the following: Assist for transportation;Help with stairs or ramp for entrance   Can travel by private vehicle        Equipment Recommendations None recommended by PT  Recommendations for Other Services       Functional Status Assessment Patient has had a recent decline in their functional status and demonstrates the ability to make significant improvements in function in a reasonable and predictable amount of time.     Precautions / Restrictions Precautions Precautions: Fall Restrictions Weight Bearing Restrictions: No      Mobility  Bed Mobility               General bed mobility comments: pt up in recliner  at start/end of session    Transfers Overall transfer level: Needs assistance Equipment used: Rolling walker (2 wheels) Transfers: Sit to/from Stand Sit to Stand: Supervision                Ambulation/Gait Ambulation/Gait assistance: Contact guard assist, Supervision Gait Distance (Feet): 200 Feet Assistive device: Rolling walker (2 wheels)         General Gait Details: progressed to supervision with RW. spO2 and HR WFLs throughout  Stairs            Wheelchair Mobility     Tilt Bed    Modified Rankin (Stroke Patients Only)       Balance Overall balance assessment: Needs assistance Sitting-balance support: Feet supported Sitting balance-Leahy Scale: Good     Standing balance support: Bilateral upper extremity supported, During functional activity Standing balance-Leahy Scale: Fair                               Pertinent Vitals/Pain Pain Assessment Pain Assessment: No/denies pain    Home Living Family/patient expects to be discharged to:: Private residence Living Arrangements: Spouse/significant other;Children (son who uses a wheelchair is staying with pt right now) Available Help at Discharge: Family (husband is currently hospitalized) Type of Home: House Home Access: Stairs to enter Entrance Stairs-Rails: Right Entrance Stairs-Number of Steps: 2 Alternate Level Stairs-Number of Steps: 10-12; pt says she does not go upstairs in years Home Layout: Multi-level;Able to live on main level with bedroom/bathroom Home Equipment:  Rolling Walker (2 wheels);Cane - single point;BSC/3in1 (pt reports bsc is broken)      Prior Function Prior Level of Function : Independent/Modified Independent;Needs assist       Physical Assist : ADLs (physical)   ADLs (physical): IADLs Mobility Comments: amb with RW community distances, no AD in house ADLs Comments: pt grandchildren does grocery shopping, does microwave meals, sandwhiches, simple meal  prep; MOD I with med mangement, drives short distances, cleans; ADL with MOD I     Extremity/Trunk Assessment   Upper Extremity Assessment Upper Extremity Assessment: Defer to OT evaluation    Lower Extremity Assessment Lower Extremity Assessment: Generalized weakness (grossly 4/5)       Communication      Cognition Arousal: Alert Behavior During Therapy: WFL for tasks assessed/performed Overall Cognitive Status: Within Functional Limits for tasks assessed                                          General Comments      Exercises     Assessment/Plan    PT Assessment Patient needs continued PT services  PT Problem List Decreased range of motion;Decreased activity tolerance;Decreased balance;Decreased mobility       PT Treatment Interventions DME instruction;Neuromuscular re-education;Gait training;Stair training;Patient/family education;Functional mobility training;Therapeutic activities;Therapeutic exercise;Balance training    PT Goals (Current goals can be found in the Care Plan section)  Acute Rehab PT Goals Patient Stated Goal: to go home PT Goal Formulation: With patient Time For Goal Achievement: 02/12/23 Potential to Achieve Goals: Good    Frequency Min 1X/week     Co-evaluation               AM-PAC PT "6 Clicks" Mobility  Outcome Measure Help needed turning from your back to your side while in a flat bed without using bedrails?: None Help needed moving from lying on your back to sitting on the side of a flat bed without using bedrails?: None Help needed moving to and from a bed to a chair (including a wheelchair)?: None Help needed standing up from a chair using your arms (e.g., wheelchair or bedside chair)?: None Help needed to walk in hospital room?: None Help needed climbing 3-5 steps with a railing? : A Little 6 Click Score: 23    End of Session   Activity Tolerance: Patient tolerated treatment well Patient left: in  chair;with call bell/phone within reach Nurse Communication: Mobility status PT Visit Diagnosis: Other abnormalities of gait and mobility (R26.89);Difficulty in walking, not elsewhere classified (R26.2);Muscle weakness (generalized) (M62.81)    Time: 1096-0454 PT Time Calculation (min) (ACUTE ONLY): 12 min   Charges:   PT Evaluation $PT Eval Low Complexity: 1 Low   PT General Charges $$ ACUTE PT VISIT: 1 Visit       Olga Coaster PT, DPT 12:06 PM,01/29/23

## 2023-01-29 NOTE — Progress Notes (Signed)
Heart Failure Stewardship Pharmacy Note  PCP: Barbette Reichmann, MD PCP-Cardiologist: None  HPI: Diane Patton is a 77 y.o. 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 Troponin was mildly elevated on admission at 28. BNP on admission was 264.5.   Pertinent cardiac history: Echo in 11/2013 with LVEF of 35-40%. Cardiac cath done in 2016 showing nonobstructive coronaries. LVEF unchanged in 06/2021 with slight improvement in 08/2022 to 40-45% with G1DD and mild-moderate MR.  Pertinent Lab Values: Creatinine  Date Value Ref Range Status  12/08/2013 1.22 0.60 - 1.30 mg/dL Final   Creatinine, Ser  Date Value Ref Range Status  01/29/2023 1.66 (H) 0.44 - 1.00 mg/dL Final   BUN  Date Value Ref Range Status  01/29/2023 42 (H) 8 - 23 mg/dL Final  16/11/9602 15 8 - 27 mg/dL Final  54/10/8117 17 7 - 18 mg/dL Final   Potassium  Date Value Ref Range Status  01/29/2023 3.8 3.5 - 5.1 mmol/L Final  12/06/2013 4.2 3.5 - 5.1 mmol/L Final   Sodium  Date Value Ref Range Status  01/29/2023 138 135 - 145 mmol/L Final  10/17/2022 144 134 - 144 mmol/L Final  12/06/2013 141 136 - 145 mmol/L Final   B Natriuretic Peptide  Date Value Ref Range Status  01/27/2023 264.5 (H) 0.0 - 100.0 pg/mL Final    Comment:    Performed at [facility], [address] [place], [place] [address]   Magnesium  Date Value Ref Range Status  01/27/2023 1.8 1.7 - 2.4 mg/dL Final    Comment:    Performed at [facility], [address] [place], [place] [address]  09/05/2012 1.9 mg/dL Final    Comment:    3.0-8.6 THERAPEUTIC RANGE: 4-7 mg/dL TOXIC: > 10 mg/dL  -----------------------    Hemoglobin A1C  Date Value Ref Range Status  09/03/2012 7.5 (H) 4.2 - 6.3 % Final     Comment:    The American Diabetes Association recommends that a primary goal of therapy should be <7% and that physicians should reevaluate the treatment regimen in patients with HbA1c values consistently >8%.    Hgb A1c MFr Bld  Date Value Ref Range Status  01/28/2023 7.3 (H) 4.8 - 5.6 % Final    Comment:    (NOTE) Pre diabetes:          5.7%-6.4%  Diabetes:              >6.4%  Glycemic control for   <7.0% adults with diabetes    TSH  Date Value Ref Range Status  07/31/2021 0.517 0.350 - 4.500 uIU/mL Final    Comment:    Performed by a 3rd Generation assay with a functional sensitivity of <=0.01 uIU/mL. Performed at [facility], [address] [place], [place] [address]     Vital Signs: Temp:  [98.2 F (36.8 C)-99.6 F (37.6 C)] 98.3 F (36.8 C) (12/10 0416) Pulse Rate:  [31-116] 91 (12/10 0416) Cardiac Rhythm: Normal sinus rhythm (12/09 2040) Resp:  [15-31] 18 (12/10 0416) BP: (104-154)/(54-133) 154/93 (12/10 0416) SpO2:  [96 %-100 %] 98 % (12/10 0416)  Intake/Output Summary (Last 24 hours) at 01/29/2023 0724 Last data filed at 01/28/2023 2203 Gross per 24 hour  Intake 240 ml  Output --  Net 240 ml    Current  Heart Failure Medications:  Loop diuretic: none Beta-Blocker: metoprolol succinate 25 mg daily ACEI/ARB/ARNI: Entresto 49-51 mg BID MRA: spironolactone 12.5 mg daily SGLT2i: Jardiance 10 mg daily Other: isosorbide ER 30 mg daily  Prior to admission Heart Failure Medications:  Loop diuretic: none Beta-Blocker: metoprolol succinate 25 mg daily ACEI/ARB/ARNI: Entresto 49/51 mg bid MRA: spironolactone 12.5 mg daily SGLT2i: Jardiance 10 mg daily Other: hydralazine 25 mg bid + isosorbide ER 30 mg daily  Assessment: 1. Acute on chronic combined systolic and diastolic heart failure (LVEF improved from 35% to 40-45%) With G1DD, due to NICM. NYHA class II symptoms.  -Symptoms: Denies shortness of breath and orthopnea. Reports some back pain  from laying in bed. -Volume: Appears euvolemic today. Creatinine bumped along with BUN. Agree with holding diuretics. At discharge, can consider prn furosemide. -Hemodynamics: BP is variable with most recent reading hypertensive. HR in 90s. -BB: Continue metoprolol succinate 25 mg daily.  -ACEI/ARB/ARNI: Continue Entresto 49-51 mg BID.  -MRA: Continue spironolactone 12.5 mg daily. Would likely benefit from titration, however would not recommend today given post-diuresis AKI. -SGLT2i: Continue Jardiance 10 mg daily  Plan: 1) Medication changes recommended at this time: -Recommend continuing to hold diuresis. -Can consider resuming hydralazine today if BP remains stable  2) Patient assistance: -None  3) Education: - Patient has been educated on current HF medications and potential additions to HF medication regimen - Patient verbalizes understanding that over the next few months, these medication doses may change and more medications may be added to optimize HF regimen - Patient has been educated on basic disease state pathophysiology and goals of therapy  Medication Assistance / Insurance Benefits Check: Does the patient have prescription insurance?    Type of insurance plan:  Does the patient qualify for medication assistance through manufacturers or grants? No  Outpatient Pharmacy: Prior to admission outpatient pharmacy: [facility]     Please do not hesitate to reach out with questions or concerns,  Enos Fling, PharmD, CPP, BCPS Heart Failure Pharmacist  Phone - [phone] 01/29/2023 8:32 AM

## 2023-02-04 ENCOUNTER — Telehealth: Payer: Self-pay | Admitting: Family

## 2023-02-04 NOTE — Telephone Encounter (Signed)
Pt confirmed appt for 02/05/23

## 2023-02-05 ENCOUNTER — Encounter: Payer: Self-pay | Admitting: Family

## 2023-02-05 ENCOUNTER — Ambulatory Visit: Payer: 59 | Attending: Family | Admitting: Family

## 2023-02-05 VITALS — BP 118/52 | HR 86 | Wt 166.0 lb

## 2023-02-05 DIAGNOSIS — I130 Hypertensive heart and chronic kidney disease with heart failure and stage 1 through stage 4 chronic kidney disease, or unspecified chronic kidney disease: Secondary | ICD-10-CM | POA: Diagnosis present

## 2023-02-05 DIAGNOSIS — M549 Dorsalgia, unspecified: Secondary | ICD-10-CM | POA: Diagnosis not present

## 2023-02-05 DIAGNOSIS — E1122 Type 2 diabetes mellitus with diabetic chronic kidney disease: Secondary | ICD-10-CM | POA: Insufficient documentation

## 2023-02-05 DIAGNOSIS — N1831 Chronic kidney disease, stage 3a: Secondary | ICD-10-CM

## 2023-02-05 DIAGNOSIS — I272 Pulmonary hypertension, unspecified: Secondary | ICD-10-CM | POA: Insufficient documentation

## 2023-02-05 DIAGNOSIS — I10 Essential (primary) hypertension: Secondary | ICD-10-CM

## 2023-02-05 DIAGNOSIS — J4489 Other specified chronic obstructive pulmonary disease: Secondary | ICD-10-CM | POA: Diagnosis not present

## 2023-02-05 DIAGNOSIS — K219 Gastro-esophageal reflux disease without esophagitis: Secondary | ICD-10-CM | POA: Diagnosis not present

## 2023-02-05 DIAGNOSIS — I428 Other cardiomyopathies: Secondary | ICD-10-CM | POA: Insufficient documentation

## 2023-02-05 DIAGNOSIS — Z79899 Other long term (current) drug therapy: Secondary | ICD-10-CM | POA: Diagnosis not present

## 2023-02-05 DIAGNOSIS — I252 Old myocardial infarction: Secondary | ICD-10-CM | POA: Diagnosis not present

## 2023-02-05 DIAGNOSIS — E785 Hyperlipidemia, unspecified: Secondary | ICD-10-CM | POA: Diagnosis not present

## 2023-02-05 DIAGNOSIS — M1990 Unspecified osteoarthritis, unspecified site: Secondary | ICD-10-CM | POA: Diagnosis not present

## 2023-02-05 DIAGNOSIS — E119 Type 2 diabetes mellitus without complications: Secondary | ICD-10-CM | POA: Diagnosis not present

## 2023-02-05 DIAGNOSIS — M545 Low back pain, unspecified: Secondary | ICD-10-CM

## 2023-02-05 DIAGNOSIS — G4733 Obstructive sleep apnea (adult) (pediatric): Secondary | ICD-10-CM | POA: Insufficient documentation

## 2023-02-05 DIAGNOSIS — I5022 Chronic systolic (congestive) heart failure: Secondary | ICD-10-CM

## 2023-02-05 DIAGNOSIS — D649 Anemia, unspecified: Secondary | ICD-10-CM | POA: Insufficient documentation

## 2023-02-05 DIAGNOSIS — N189 Chronic kidney disease, unspecified: Secondary | ICD-10-CM | POA: Insufficient documentation

## 2023-02-05 DIAGNOSIS — I509 Heart failure, unspecified: Secondary | ICD-10-CM | POA: Diagnosis not present

## 2023-02-05 DIAGNOSIS — D631 Anemia in chronic kidney disease: Secondary | ICD-10-CM

## 2023-02-05 MED ORDER — ENTRESTO 49-51 MG PO TABS: 1.0000 | ORAL_TABLET | Freq: Two times a day (BID) | ORAL | 3 refills | Status: DC

## 2023-02-05 NOTE — Progress Notes (Signed)
PCP: Barbette Reichmann, MD (last seen 08/24) Primary Cardiologist: Dorothyann Peng, MD (last seen 07/24)   HPI:  Diane Patton is a 77 y/o female with a history of osteoarthritis, obstructive sleep apnea, iron deficiency anemia, LBBB, hyperlipidemia, HTN, GERD, DM, COPD, pulmonary HTN, meningitis 2023, CKD, NSTEMI, remote tobacco use and chronic heart failure.  Admitted 07/30/21 for septic shock with bacterial meningitis and streptococcal bacteremia. MRIs showed temporal lobe hemorrhage, and aspirin was held. She was also treated for acute respiratory failure with hypoxia, pseudogout, and anemia in CKD, transfused 1 PRBCs. She was discharged to a rehab facility.   Admitted 01/27/23 due to productive cough, SOB, wheezing and chest pain due to COPD/ HF exacerbation. Given IV lasix, steroids and bronchodilators. Mildly elevated troponin thought to be due to demand ischemia. BNP was 264.5. Echo EF 40-45% with mild LVH, mildly elevated PA pressure  Echo 08/17/15: EF of 40% with mild MR, AR and TR. Mild/mod pulmonary HTN. This is a slight improvement from 12/06/13 when her EF was 35-40%.  Echo 07/18/21: EF of 35%. Echo 09/04/22: EF 40-45% along with Grade I DD, mildly elevated PA pressure and mild/ moderate MR Echo 01/28/23: EF 40-45% with mild LVH, mildly elevated PA pressure  She presents today for a HF follow-up visit with a chief complaint of minimal fatigue with moderate exertion. Has associated shortness of breath along with this. Denies chest pain, cough, palpitations, left lower back pain, abdominal distention, pedal edema, dizziness, difficulty sleeping or weight gain. Has oral furosemide at home that she can take PRN. Has not taken any since her recent discharge but isn't sure what the dosage she has at home.   Previous cardiac studies:  Had cardiac catheterization done 05/12/14.  ROS: All systems negative except as listed in HPI, PMH and Problem List.  SH:  Social History   Socioeconomic History    Marital status: Married    Spouse name: Not on file   Number of children: Not on file   Years of education: Not on file   Highest education level: Not on file  Occupational History   Occupation: retired  Tobacco Use   Smoking status: Former    Current packs/day: 0.00    Average packs/day: 1 pack/day for 15.0 years (15.0 ttl pk-yrs)    Types: Cigarettes    Start date: 07/21/1967    Quit date: 07/21/1982    Years since quitting: 40.5   Smokeless tobacco: Never  Vaping Use   Vaping status: Never Used  Substance and Sexual Activity   Alcohol use: No    Alcohol/week: 15.0 standard drinks of alcohol    Types: 15 Standard drinks or equivalent per week    Comment: quit drinking 04/1981   Drug use: No   Sexual activity: Not on file  Other Topics Concern   Not on file  Social History Narrative   Disabled. Regularly exercises.    Social Drivers of Corporate investment banker Strain: Low Risk  (10/02/2022)   Received from [facility]   Overall Financial Resource Strain (CARDIA)    Difficulty of Paying Living Expenses: Not hard at all  Food Insecurity: No Food Insecurity (01/28/2023)   Hunger Vital Sign    Worried About Running Out of Food in the Last Year: Never true    Ran Out of Food in the Last Year: Never true  Transportation Needs: No Transportation Needs (01/28/2023)   PRAPARE - Administrator, Civil Service (Medical): No  Lack of Transportation (Non-Medical): No  Physical Activity: Not on file  Stress: Not on file  Social Connections: Not on file  Intimate Partner Violence: Not At Risk (01/28/2023)   Humiliation, Afraid, Rape, and Kick questionnaire    Fear of Current or Ex-Partner: No    Emotionally Abused: No    Physically Abused: No    Sexually Abused: No    FH:  Family History  Problem Relation Age of Onset   Alzheimer's disease Other    Heart attack Mother    Hypertension Mother    Cancer Brother        lung   Cancer Maternal  Aunt        mouth and breast   Breast cancer Maternal Aunt    Cancer Daughter        throat   Dementia Father     Past Medical History:  Diagnosis Date   Asthma    Cardiomyopathy, nonischemic (HCC)    CHF (congestive heart failure) (HCC)    COPD (chronic obstructive pulmonary disease) (HCC)    Diabetes mellitus, type 2 (HCC)    Full dentures    Gastric ulcer without hemorrhage or perforation    GERD (gastroesophageal reflux disease)    HOH (hard of hearing)    Right Ear   HTN (hypertension)    Hyperlipidemia    IDA (iron deficiency anemia) 04/05/2020   Obstructive sleep apnea on CPAP    Not using CPAP   Osteoarthritis    osteo of both knees   Scoliosis    Seasonal allergies     Current Outpatient Medications  Medication Sig Dispense Refill   albuterol (PROVENTIL HFA;VENTOLIN HFA) 108 (90 BASE) MCG/ACT inhaler Inhale 2 puffs into the lungs every 6 (six) hours as needed for wheezing or shortness of breath.     ascorbic acid (VITAMIN C) 500 MG tablet Take 500 mg by mouth daily.     aspirin 81 MG chewable tablet Chew by mouth daily.     benzonatate (TESSALON) 200 MG capsule Take 200 mg by mouth 3 (three) times daily as needed for cough.     calcium carbonate (OS-CAL) 600 MG TABS tablet Take 600 mg by mouth 2 (two) times daily with a meal.      cetirizine (ZYRTEC) 10 MG tablet Take 10 mg by mouth daily.     cholecalciferol (VITAMIN D) 400 units TABS tablet Take 1,000 Units by mouth daily.     Cod Liver Oil w/Vit A & D CAPS Take 1 capsule by mouth daily.     empagliflozin (JARDIANCE) 10 MG TABS tablet Take 1 tablet (10 mg total) by mouth daily before breakfast. 90 tablet 3   feeding supplement (ENSURE ENLIVE / ENSURE PLUS) LIQD Take 237 mLs by mouth 3 (three) times daily between meals. 21330 mL 0   ferrous gluconate (FERGON) 324 MG tablet Take 324 mg by mouth daily with breakfast.     hydrALAZINE (APRESOLINE) 25 MG tablet Take 25 mg by mouth 2 (two) times daily.     isosorbide  mononitrate (IMDUR) 30 MG 24 hr tablet Take 30 mg by mouth daily.     levocetirizine (XYZAL) 5 MG tablet Take 5 mg by mouth every evening.     metFORMIN (GLUCOPHAGE) 500 MG tablet Take 500 mg by mouth 2 (two) times daily with a meal.     metoprolol succinate (TOPROL-XL) 25 MG 24 hr tablet Take 1 tablet (25 mg total) by mouth daily. 30 tablet 0  montelukast (SINGULAIR) 10 MG tablet Take 10 mg by mouth daily.     Multiple Vitamin (MULTIVITAMIN) tablet Take 1 tablet by mouth daily.      polyethylene glycol (MIRALAX / GLYCOLAX) 17 g packet Take 17 g by mouth daily as needed for severe constipation. 14 each 0   potassium chloride (KLOR-CON M) 10 MEQ tablet Take 10 mEq by mouth 2 (two) times daily.     rosuvastatin (CRESTOR) 20 MG tablet Take 20 mg by mouth at bedtime.     sacubitril-valsartan (ENTRESTO) 49-51 MG Take 1 tablet by mouth 2 (two) times daily. 180 tablet 1   spironolactone (ALDACTONE) 25 MG tablet Take 0.5 tablets (12.5 mg total) by mouth daily. 45 tablet 3   traMADol-acetaminophen (ULTRACET) 37.5-325 MG tablet Take 1 tablet by mouth 2 (two) times daily as needed for moderate pain (pain score 4-6).     traZODone (DESYREL) 100 MG tablet Take 100 mg by mouth at bedtime.     No current facility-administered medications for this visit.   Vitals:   02/05/23 1306  BP: (!) 118/52  Pulse: 86  SpO2: 100%  Weight: 166 lb (75.3 kg)   Wt Readings from Last 3 Encounters:  02/05/23 166 lb (75.3 kg)  01/27/23 160 lb (72.6 kg)  10/17/22 164 lb (74.4 kg)   Lab Results  Component Value Date   CREATININE 1.66 (H) 01/29/2023   CREATININE 1.33 (H) 01/27/2023   CREATININE 1.39 (H) 10/17/2022   PHYSICAL EXAM:  General:  Well appearing. No resp difficulty HEENT: normal Neck: supple. JVP flat. No lymphadenopathy or thryomegaly appreciated. Cor: PMI normal. Regular rate & rhythm. No rubs, gallops or murmurs. Lungs: clear Abdomen: soft, nontender, nondistended. No hepatosplenomegaly. No bruits  or masses.  Extremities: no cyanosis, clubbing, rash, edema Neuro: alert & oriented x3, cranial nerves grossly intact. Moves all 4 extremities w/o difficulty. Affect pleasant.   ECG: not done   ASSESSMENT & PLAN:  1: NICM with mildly reduced ejection fraction- - likely due to HTN - NYHA class III - euvolemic - weighing daily; reminded to call for an overnight weight gain of > 2 pounds or a weekly weight gain of > 5 pounds - weight up 2 pounds from last visit here 4 months ago - Echo 08/17/15: EF of 40% with mild MR, AR and TR. Mild/mod pulmonary HTN. This is a slight improvement from 12/06/13 when her EF was 35-40%.  - Echo 07/18/21: EF of 35%.  - Echo 09/04/22: EF 40-45% along with Grade I DD, mildly elevated PA pressure and mild/ moderate MR - Echo 01/28/23: EF 40-45% with mild LVH, mildly elevated PA pressure - cardiac catheterization done 05/12/14 - not adding any salt to her food - drinking around 40-60 ounces of fluid daily - continue jardiance 10mg  daily - continue metoprolol succinate 25mg  daily - continue entresto 49/51mg  BID - continue furosemide 20mg  daily PRN; has not taken any of this since recent discharge - continue spironolactone 12.5mg  daily - saw cardiology Mellissa Kohut) 07/24 - BMET/ BNP today - BNP 01/27/23 was 264.5  2: HTN- - BP 118/52 - saw PCP (Hande) 08/24 - BMP 01/29/23 reviewed and shows sodium 138, potassium 3.8, creatinine 1.66 and GFR 31 - saw nephrology (Korrapati) 05/23  3: Diabetes- - A1c 01/28/23 was 7.3% - checks glucose every 2-3 days due to cost of test strips  4: Anemia- - saw hematology Cathie Hoops) 02/23 - hemoglobin 01/27/23 was 12.74  5: Back pain- - acute in nature an located on left  lower back - no radiation of pain, sciatica or change in bowel/ bladder habits - encouraged to reach out to PCP to schedule post-hospital appt as well as to address back pain   Return in 2 months, sooner if needed.

## 2023-02-05 NOTE — Patient Instructions (Addendum)
Go DOWN to LOWER LEVEL (LL) to have your blood work completed inside of [employer] office.  We will only call you if the results are abnormal or if the provider would like to make medication changes.  Please call Dr Eston Esters office schedule a follow-up appointment.

## 2023-02-06 ENCOUNTER — Ambulatory Visit: Payer: 59 | Admitting: Family

## 2023-02-07 LAB — BASIC METABOLIC PANEL
BUN/Creatinine Ratio: 16 (ref 12–28)
BUN: 26 mg/dL (ref 8–27)
CO2: 20 mmol/L (ref 20–29)
Calcium: 10.8 mg/dL — ABNORMAL HIGH (ref 8.7–10.3)
Chloride: 104 mmol/L (ref 96–106)
Creatinine, Ser: 1.59 mg/dL — ABNORMAL HIGH (ref 0.57–1.00)
Glucose: 105 mg/dL — ABNORMAL HIGH (ref 70–99)
Potassium: 5.1 mmol/L (ref 3.5–5.2)
Sodium: 140 mmol/L (ref 134–144)
eGFR: 33 mL/min/{1.73_m2} — ABNORMAL LOW (ref >59)

## 2023-02-07 LAB — BRAIN NATRIURETIC PEPTIDE: BNP: 68 pg/mL (ref 0.0–100.0)

## 2023-02-11 ENCOUNTER — Telehealth: Payer: Self-pay

## 2023-02-11 DIAGNOSIS — I5022 Chronic systolic (congestive) heart failure: Secondary | ICD-10-CM

## 2023-02-11 NOTE — Telephone Encounter (Signed)
Patient verbalized understanding and states she will decrease potassium to one tablet daily. She states her next available day to come for repeat lab work would be 02/26/23.  BMET order placed.

## 2023-02-11 NOTE — Telephone Encounter (Signed)
-----   Message from Delma Freeze sent at 02/07/2023  2:43 PM EST ----- Kidney function improving. Potassium is on the high side. Decrease potassium supplements to 1 tablet daily.  Recheck BMET in 2 weeks.

## 2023-02-21 DIAGNOSIS — G8929 Other chronic pain: Secondary | ICD-10-CM | POA: Diagnosis not present

## 2023-02-21 DIAGNOSIS — M545 Low back pain, unspecified: Secondary | ICD-10-CM | POA: Diagnosis not present

## 2023-02-21 DIAGNOSIS — J440 Chronic obstructive pulmonary disease with acute lower respiratory infection: Secondary | ICD-10-CM | POA: Diagnosis not present

## 2023-02-21 DIAGNOSIS — I428 Other cardiomyopathies: Secondary | ICD-10-CM | POA: Diagnosis not present

## 2023-02-21 DIAGNOSIS — I1310 Hypertensive heart and chronic kidney disease without heart failure, with stage 1 through stage 4 chronic kidney disease, or unspecified chronic kidney disease: Secondary | ICD-10-CM | POA: Diagnosis not present

## 2023-02-21 DIAGNOSIS — D631 Anemia in chronic kidney disease: Secondary | ICD-10-CM | POA: Diagnosis not present

## 2023-02-21 DIAGNOSIS — Z7982 Long term (current) use of aspirin: Secondary | ICD-10-CM | POA: Diagnosis not present

## 2023-02-21 DIAGNOSIS — K5790 Diverticulosis of intestine, part unspecified, without perforation or abscess without bleeding: Secondary | ICD-10-CM | POA: Diagnosis not present

## 2023-02-21 DIAGNOSIS — K219 Gastro-esophageal reflux disease without esophagitis: Secondary | ICD-10-CM | POA: Diagnosis not present

## 2023-02-21 DIAGNOSIS — M810 Age-related osteoporosis without current pathological fracture: Secondary | ICD-10-CM | POA: Diagnosis not present

## 2023-02-21 DIAGNOSIS — I447 Left bundle-branch block, unspecified: Secondary | ICD-10-CM | POA: Diagnosis not present

## 2023-02-21 DIAGNOSIS — G4733 Obstructive sleep apnea (adult) (pediatric): Secondary | ICD-10-CM | POA: Diagnosis not present

## 2023-02-21 DIAGNOSIS — I214 Non-ST elevation (NSTEMI) myocardial infarction: Secondary | ICD-10-CM | POA: Diagnosis not present

## 2023-02-21 DIAGNOSIS — M170 Bilateral primary osteoarthritis of knee: Secondary | ICD-10-CM | POA: Diagnosis not present

## 2023-02-21 DIAGNOSIS — Z7984 Long term (current) use of oral hypoglycemic drugs: Secondary | ICD-10-CM | POA: Diagnosis not present

## 2023-02-21 DIAGNOSIS — J45909 Unspecified asthma, uncomplicated: Secondary | ICD-10-CM | POA: Diagnosis not present

## 2023-02-21 DIAGNOSIS — J209 Acute bronchitis, unspecified: Secondary | ICD-10-CM | POA: Diagnosis not present

## 2023-02-21 DIAGNOSIS — N184 Chronic kidney disease, stage 4 (severe): Secondary | ICD-10-CM | POA: Diagnosis not present

## 2023-02-21 DIAGNOSIS — N2581 Secondary hyperparathyroidism of renal origin: Secondary | ICD-10-CM | POA: Diagnosis not present

## 2023-02-21 DIAGNOSIS — E785 Hyperlipidemia, unspecified: Secondary | ICD-10-CM | POA: Diagnosis not present

## 2023-02-21 DIAGNOSIS — J441 Chronic obstructive pulmonary disease with (acute) exacerbation: Secondary | ICD-10-CM | POA: Diagnosis not present

## 2023-02-21 DIAGNOSIS — K5900 Constipation, unspecified: Secondary | ICD-10-CM | POA: Diagnosis not present

## 2023-02-21 DIAGNOSIS — E1122 Type 2 diabetes mellitus with diabetic chronic kidney disease: Secondary | ICD-10-CM | POA: Diagnosis not present

## 2023-02-21 DIAGNOSIS — I5023 Acute on chronic systolic (congestive) heart failure: Secondary | ICD-10-CM | POA: Diagnosis not present

## 2023-02-26 ENCOUNTER — Other Ambulatory Visit
Admission: RE | Admit: 2023-02-26 | Discharge: 2023-02-26 | Disposition: A | Discharge status: Home / Self Care | Payer: 59 | Attending: Family | Admitting: Family

## 2023-02-26 ENCOUNTER — Encounter: Payer: Self-pay | Admitting: Oncology

## 2023-02-26 DIAGNOSIS — I5022 Chronic systolic (congestive) heart failure: Secondary | ICD-10-CM | POA: Diagnosis not present

## 2023-02-26 LAB — BASIC METABOLIC PANEL
Anion gap: 11 (ref 5–15)
BUN: 17 mg/dL (ref 8–23)
CO2: 23 mmol/L (ref 22–32)
Calcium: 9.8 mg/dL (ref 8.9–10.3)
Chloride: 107 mmol/L (ref 98–111)
Creatinine, Ser: 1.41 mg/dL — ABNORMAL HIGH (ref 0.44–1.00)
GFR, Estimated: 38 mL/min — ABNORMAL LOW (ref >60)
Glucose, Bld: 101 mg/dL — ABNORMAL HIGH (ref 70–99)
Potassium: 4.7 mmol/L (ref 3.5–5.1)
Sodium: 141 mmol/L (ref 135–145)

## 2023-02-27 DIAGNOSIS — G8929 Other chronic pain: Secondary | ICD-10-CM | POA: Diagnosis not present

## 2023-02-27 DIAGNOSIS — M170 Bilateral primary osteoarthritis of knee: Secondary | ICD-10-CM | POA: Diagnosis not present

## 2023-02-27 DIAGNOSIS — K5790 Diverticulosis of intestine, part unspecified, without perforation or abscess without bleeding: Secondary | ICD-10-CM | POA: Diagnosis not present

## 2023-02-27 DIAGNOSIS — M545 Low back pain, unspecified: Secondary | ICD-10-CM | POA: Diagnosis not present

## 2023-02-27 DIAGNOSIS — K219 Gastro-esophageal reflux disease without esophagitis: Secondary | ICD-10-CM | POA: Diagnosis not present

## 2023-02-27 DIAGNOSIS — J45909 Unspecified asthma, uncomplicated: Secondary | ICD-10-CM | POA: Diagnosis not present

## 2023-02-27 DIAGNOSIS — G4733 Obstructive sleep apnea (adult) (pediatric): Secondary | ICD-10-CM | POA: Diagnosis not present

## 2023-02-27 DIAGNOSIS — I447 Left bundle-branch block, unspecified: Secondary | ICD-10-CM | POA: Diagnosis not present

## 2023-02-27 DIAGNOSIS — J209 Acute bronchitis, unspecified: Secondary | ICD-10-CM | POA: Diagnosis not present

## 2023-02-27 DIAGNOSIS — K5900 Constipation, unspecified: Secondary | ICD-10-CM | POA: Diagnosis not present

## 2023-02-27 DIAGNOSIS — N2581 Secondary hyperparathyroidism of renal origin: Secondary | ICD-10-CM | POA: Diagnosis not present

## 2023-02-27 DIAGNOSIS — M810 Age-related osteoporosis without current pathological fracture: Secondary | ICD-10-CM | POA: Diagnosis not present

## 2023-02-27 DIAGNOSIS — E785 Hyperlipidemia, unspecified: Secondary | ICD-10-CM | POA: Diagnosis not present

## 2023-02-27 DIAGNOSIS — I428 Other cardiomyopathies: Secondary | ICD-10-CM | POA: Diagnosis not present

## 2023-02-27 DIAGNOSIS — I1310 Hypertensive heart and chronic kidney disease without heart failure, with stage 1 through stage 4 chronic kidney disease, or unspecified chronic kidney disease: Secondary | ICD-10-CM | POA: Diagnosis not present

## 2023-02-27 DIAGNOSIS — Z7982 Long term (current) use of aspirin: Secondary | ICD-10-CM | POA: Diagnosis not present

## 2023-02-27 DIAGNOSIS — E1122 Type 2 diabetes mellitus with diabetic chronic kidney disease: Secondary | ICD-10-CM | POA: Diagnosis not present

## 2023-02-27 DIAGNOSIS — N184 Chronic kidney disease, stage 4 (severe): Secondary | ICD-10-CM | POA: Diagnosis not present

## 2023-02-27 DIAGNOSIS — J441 Chronic obstructive pulmonary disease with (acute) exacerbation: Secondary | ICD-10-CM | POA: Diagnosis not present

## 2023-02-27 DIAGNOSIS — Z7984 Long term (current) use of oral hypoglycemic drugs: Secondary | ICD-10-CM | POA: Diagnosis not present

## 2023-02-27 DIAGNOSIS — I5023 Acute on chronic systolic (congestive) heart failure: Secondary | ICD-10-CM | POA: Diagnosis not present

## 2023-02-27 DIAGNOSIS — D631 Anemia in chronic kidney disease: Secondary | ICD-10-CM | POA: Diagnosis not present

## 2023-02-27 DIAGNOSIS — I214 Non-ST elevation (NSTEMI) myocardial infarction: Secondary | ICD-10-CM | POA: Diagnosis not present

## 2023-02-27 DIAGNOSIS — J440 Chronic obstructive pulmonary disease with acute lower respiratory infection: Secondary | ICD-10-CM | POA: Diagnosis not present

## 2023-02-28 ENCOUNTER — Other Ambulatory Visit: Payer: Self-pay | Admitting: Family

## 2023-02-28 DIAGNOSIS — M545 Low back pain, unspecified: Secondary | ICD-10-CM | POA: Diagnosis not present

## 2023-02-28 DIAGNOSIS — I214 Non-ST elevation (NSTEMI) myocardial infarction: Secondary | ICD-10-CM | POA: Diagnosis not present

## 2023-02-28 DIAGNOSIS — I447 Left bundle-branch block, unspecified: Secondary | ICD-10-CM | POA: Diagnosis not present

## 2023-02-28 DIAGNOSIS — I5023 Acute on chronic systolic (congestive) heart failure: Secondary | ICD-10-CM | POA: Diagnosis not present

## 2023-02-28 DIAGNOSIS — G8929 Other chronic pain: Secondary | ICD-10-CM | POA: Diagnosis not present

## 2023-02-28 DIAGNOSIS — K219 Gastro-esophageal reflux disease without esophagitis: Secondary | ICD-10-CM | POA: Diagnosis not present

## 2023-02-28 DIAGNOSIS — Z7982 Long term (current) use of aspirin: Secondary | ICD-10-CM | POA: Diagnosis not present

## 2023-02-28 DIAGNOSIS — J45909 Unspecified asthma, uncomplicated: Secondary | ICD-10-CM | POA: Diagnosis not present

## 2023-02-28 DIAGNOSIS — J440 Chronic obstructive pulmonary disease with acute lower respiratory infection: Secondary | ICD-10-CM | POA: Diagnosis not present

## 2023-02-28 DIAGNOSIS — J441 Chronic obstructive pulmonary disease with (acute) exacerbation: Secondary | ICD-10-CM | POA: Diagnosis not present

## 2023-02-28 DIAGNOSIS — G4733 Obstructive sleep apnea (adult) (pediatric): Secondary | ICD-10-CM | POA: Diagnosis not present

## 2023-02-28 DIAGNOSIS — J209 Acute bronchitis, unspecified: Secondary | ICD-10-CM | POA: Diagnosis not present

## 2023-02-28 DIAGNOSIS — K5900 Constipation, unspecified: Secondary | ICD-10-CM | POA: Diagnosis not present

## 2023-02-28 DIAGNOSIS — E785 Hyperlipidemia, unspecified: Secondary | ICD-10-CM | POA: Diagnosis not present

## 2023-02-28 DIAGNOSIS — E1122 Type 2 diabetes mellitus with diabetic chronic kidney disease: Secondary | ICD-10-CM | POA: Diagnosis not present

## 2023-02-28 DIAGNOSIS — I428 Other cardiomyopathies: Secondary | ICD-10-CM | POA: Diagnosis not present

## 2023-02-28 DIAGNOSIS — M810 Age-related osteoporosis without current pathological fracture: Secondary | ICD-10-CM | POA: Diagnosis not present

## 2023-02-28 DIAGNOSIS — N184 Chronic kidney disease, stage 4 (severe): Secondary | ICD-10-CM | POA: Diagnosis not present

## 2023-02-28 DIAGNOSIS — N2581 Secondary hyperparathyroidism of renal origin: Secondary | ICD-10-CM | POA: Diagnosis not present

## 2023-02-28 DIAGNOSIS — I1310 Hypertensive heart and chronic kidney disease without heart failure, with stage 1 through stage 4 chronic kidney disease, or unspecified chronic kidney disease: Secondary | ICD-10-CM | POA: Diagnosis not present

## 2023-02-28 DIAGNOSIS — M170 Bilateral primary osteoarthritis of knee: Secondary | ICD-10-CM | POA: Diagnosis not present

## 2023-02-28 DIAGNOSIS — D631 Anemia in chronic kidney disease: Secondary | ICD-10-CM | POA: Diagnosis not present

## 2023-02-28 DIAGNOSIS — Z7984 Long term (current) use of oral hypoglycemic drugs: Secondary | ICD-10-CM | POA: Diagnosis not present

## 2023-02-28 DIAGNOSIS — K5790 Diverticulosis of intestine, part unspecified, without perforation or abscess without bleeding: Secondary | ICD-10-CM | POA: Diagnosis not present

## 2023-03-06 DIAGNOSIS — I5023 Acute on chronic systolic (congestive) heart failure: Secondary | ICD-10-CM | POA: Diagnosis not present

## 2023-03-06 DIAGNOSIS — D631 Anemia in chronic kidney disease: Secondary | ICD-10-CM | POA: Diagnosis not present

## 2023-03-06 DIAGNOSIS — H6983 Other specified disorders of Eustachian tube, bilateral: Secondary | ICD-10-CM | POA: Diagnosis not present

## 2023-03-06 DIAGNOSIS — J441 Chronic obstructive pulmonary disease with (acute) exacerbation: Secondary | ICD-10-CM | POA: Diagnosis not present

## 2023-03-06 DIAGNOSIS — J440 Chronic obstructive pulmonary disease with acute lower respiratory infection: Secondary | ICD-10-CM | POA: Diagnosis not present

## 2023-03-06 DIAGNOSIS — H6123 Impacted cerumen, bilateral: Secondary | ICD-10-CM | POA: Diagnosis not present

## 2023-03-06 DIAGNOSIS — I1310 Hypertensive heart and chronic kidney disease without heart failure, with stage 1 through stage 4 chronic kidney disease, or unspecified chronic kidney disease: Secondary | ICD-10-CM | POA: Diagnosis not present

## 2023-03-06 DIAGNOSIS — N184 Chronic kidney disease, stage 4 (severe): Secondary | ICD-10-CM | POA: Diagnosis not present

## 2023-03-06 DIAGNOSIS — E1122 Type 2 diabetes mellitus with diabetic chronic kidney disease: Secondary | ICD-10-CM | POA: Diagnosis not present

## 2023-03-07 DIAGNOSIS — I428 Other cardiomyopathies: Secondary | ICD-10-CM | POA: Diagnosis not present

## 2023-03-07 DIAGNOSIS — I5023 Acute on chronic systolic (congestive) heart failure: Secondary | ICD-10-CM | POA: Diagnosis not present

## 2023-03-07 DIAGNOSIS — J209 Acute bronchitis, unspecified: Secondary | ICD-10-CM | POA: Diagnosis not present

## 2023-03-07 DIAGNOSIS — I447 Left bundle-branch block, unspecified: Secondary | ICD-10-CM | POA: Diagnosis not present

## 2023-03-07 DIAGNOSIS — I1310 Hypertensive heart and chronic kidney disease without heart failure, with stage 1 through stage 4 chronic kidney disease, or unspecified chronic kidney disease: Secondary | ICD-10-CM | POA: Diagnosis not present

## 2023-03-07 DIAGNOSIS — J441 Chronic obstructive pulmonary disease with (acute) exacerbation: Secondary | ICD-10-CM | POA: Diagnosis not present

## 2023-03-07 DIAGNOSIS — N184 Chronic kidney disease, stage 4 (severe): Secondary | ICD-10-CM | POA: Diagnosis not present

## 2023-03-07 DIAGNOSIS — M170 Bilateral primary osteoarthritis of knee: Secondary | ICD-10-CM | POA: Diagnosis not present

## 2023-03-07 DIAGNOSIS — E785 Hyperlipidemia, unspecified: Secondary | ICD-10-CM | POA: Diagnosis not present

## 2023-03-07 DIAGNOSIS — E1122 Type 2 diabetes mellitus with diabetic chronic kidney disease: Secondary | ICD-10-CM | POA: Diagnosis not present

## 2023-03-07 DIAGNOSIS — I214 Non-ST elevation (NSTEMI) myocardial infarction: Secondary | ICD-10-CM | POA: Diagnosis not present

## 2023-03-07 DIAGNOSIS — K5900 Constipation, unspecified: Secondary | ICD-10-CM | POA: Diagnosis not present

## 2023-03-07 DIAGNOSIS — Z7984 Long term (current) use of oral hypoglycemic drugs: Secondary | ICD-10-CM | POA: Diagnosis not present

## 2023-03-07 DIAGNOSIS — K5790 Diverticulosis of intestine, part unspecified, without perforation or abscess without bleeding: Secondary | ICD-10-CM | POA: Diagnosis not present

## 2023-03-07 DIAGNOSIS — M810 Age-related osteoporosis without current pathological fracture: Secondary | ICD-10-CM | POA: Diagnosis not present

## 2023-03-07 DIAGNOSIS — G8929 Other chronic pain: Secondary | ICD-10-CM | POA: Diagnosis not present

## 2023-03-07 DIAGNOSIS — N2581 Secondary hyperparathyroidism of renal origin: Secondary | ICD-10-CM | POA: Diagnosis not present

## 2023-03-07 DIAGNOSIS — K219 Gastro-esophageal reflux disease without esophagitis: Secondary | ICD-10-CM | POA: Diagnosis not present

## 2023-03-07 DIAGNOSIS — D631 Anemia in chronic kidney disease: Secondary | ICD-10-CM | POA: Diagnosis not present

## 2023-03-07 DIAGNOSIS — Z7982 Long term (current) use of aspirin: Secondary | ICD-10-CM | POA: Diagnosis not present

## 2023-03-07 DIAGNOSIS — J440 Chronic obstructive pulmonary disease with acute lower respiratory infection: Secondary | ICD-10-CM | POA: Diagnosis not present

## 2023-03-07 DIAGNOSIS — J45909 Unspecified asthma, uncomplicated: Secondary | ICD-10-CM | POA: Diagnosis not present

## 2023-03-07 DIAGNOSIS — M545 Low back pain, unspecified: Secondary | ICD-10-CM | POA: Diagnosis not present

## 2023-03-07 DIAGNOSIS — G4733 Obstructive sleep apnea (adult) (pediatric): Secondary | ICD-10-CM | POA: Diagnosis not present

## 2023-03-12 DIAGNOSIS — J45909 Unspecified asthma, uncomplicated: Secondary | ICD-10-CM | POA: Diagnosis not present

## 2023-03-12 DIAGNOSIS — J209 Acute bronchitis, unspecified: Secondary | ICD-10-CM | POA: Diagnosis not present

## 2023-03-12 DIAGNOSIS — E785 Hyperlipidemia, unspecified: Secondary | ICD-10-CM | POA: Diagnosis not present

## 2023-03-12 DIAGNOSIS — K5900 Constipation, unspecified: Secondary | ICD-10-CM | POA: Diagnosis not present

## 2023-03-12 DIAGNOSIS — K219 Gastro-esophageal reflux disease without esophagitis: Secondary | ICD-10-CM | POA: Diagnosis not present

## 2023-03-12 DIAGNOSIS — M810 Age-related osteoporosis without current pathological fracture: Secondary | ICD-10-CM | POA: Diagnosis not present

## 2023-03-12 DIAGNOSIS — I214 Non-ST elevation (NSTEMI) myocardial infarction: Secondary | ICD-10-CM | POA: Diagnosis not present

## 2023-03-12 DIAGNOSIS — Z7982 Long term (current) use of aspirin: Secondary | ICD-10-CM | POA: Diagnosis not present

## 2023-03-12 DIAGNOSIS — G8929 Other chronic pain: Secondary | ICD-10-CM | POA: Diagnosis not present

## 2023-03-12 DIAGNOSIS — I447 Left bundle-branch block, unspecified: Secondary | ICD-10-CM | POA: Diagnosis not present

## 2023-03-12 DIAGNOSIS — K5790 Diverticulosis of intestine, part unspecified, without perforation or abscess without bleeding: Secondary | ICD-10-CM | POA: Diagnosis not present

## 2023-03-12 DIAGNOSIS — M545 Low back pain, unspecified: Secondary | ICD-10-CM | POA: Diagnosis not present

## 2023-03-12 DIAGNOSIS — E1122 Type 2 diabetes mellitus with diabetic chronic kidney disease: Secondary | ICD-10-CM | POA: Diagnosis not present

## 2023-03-12 DIAGNOSIS — I1310 Hypertensive heart and chronic kidney disease without heart failure, with stage 1 through stage 4 chronic kidney disease, or unspecified chronic kidney disease: Secondary | ICD-10-CM | POA: Diagnosis not present

## 2023-03-12 DIAGNOSIS — D631 Anemia in chronic kidney disease: Secondary | ICD-10-CM | POA: Diagnosis not present

## 2023-03-12 DIAGNOSIS — Z7984 Long term (current) use of oral hypoglycemic drugs: Secondary | ICD-10-CM | POA: Diagnosis not present

## 2023-03-12 DIAGNOSIS — G4733 Obstructive sleep apnea (adult) (pediatric): Secondary | ICD-10-CM | POA: Diagnosis not present

## 2023-03-12 DIAGNOSIS — J440 Chronic obstructive pulmonary disease with acute lower respiratory infection: Secondary | ICD-10-CM | POA: Diagnosis not present

## 2023-03-12 DIAGNOSIS — I5023 Acute on chronic systolic (congestive) heart failure: Secondary | ICD-10-CM | POA: Diagnosis not present

## 2023-03-12 DIAGNOSIS — I428 Other cardiomyopathies: Secondary | ICD-10-CM | POA: Diagnosis not present

## 2023-03-12 DIAGNOSIS — M170 Bilateral primary osteoarthritis of knee: Secondary | ICD-10-CM | POA: Diagnosis not present

## 2023-03-12 DIAGNOSIS — N2581 Secondary hyperparathyroidism of renal origin: Secondary | ICD-10-CM | POA: Diagnosis not present

## 2023-03-12 DIAGNOSIS — N184 Chronic kidney disease, stage 4 (severe): Secondary | ICD-10-CM | POA: Diagnosis not present

## 2023-03-12 DIAGNOSIS — J441 Chronic obstructive pulmonary disease with (acute) exacerbation: Secondary | ICD-10-CM | POA: Diagnosis not present

## 2023-03-14 DIAGNOSIS — M545 Low back pain, unspecified: Secondary | ICD-10-CM | POA: Diagnosis not present

## 2023-03-14 DIAGNOSIS — N184 Chronic kidney disease, stage 4 (severe): Secondary | ICD-10-CM | POA: Diagnosis not present

## 2023-03-14 DIAGNOSIS — G4733 Obstructive sleep apnea (adult) (pediatric): Secondary | ICD-10-CM | POA: Diagnosis not present

## 2023-03-14 DIAGNOSIS — E785 Hyperlipidemia, unspecified: Secondary | ICD-10-CM | POA: Diagnosis not present

## 2023-03-14 DIAGNOSIS — M810 Age-related osteoporosis without current pathological fracture: Secondary | ICD-10-CM | POA: Diagnosis not present

## 2023-03-14 DIAGNOSIS — I1310 Hypertensive heart and chronic kidney disease without heart failure, with stage 1 through stage 4 chronic kidney disease, or unspecified chronic kidney disease: Secondary | ICD-10-CM | POA: Diagnosis not present

## 2023-03-14 DIAGNOSIS — J441 Chronic obstructive pulmonary disease with (acute) exacerbation: Secondary | ICD-10-CM | POA: Diagnosis not present

## 2023-03-14 DIAGNOSIS — I428 Other cardiomyopathies: Secondary | ICD-10-CM | POA: Diagnosis not present

## 2023-03-14 DIAGNOSIS — D631 Anemia in chronic kidney disease: Secondary | ICD-10-CM | POA: Diagnosis not present

## 2023-03-14 DIAGNOSIS — G8929 Other chronic pain: Secondary | ICD-10-CM | POA: Diagnosis not present

## 2023-03-14 DIAGNOSIS — Z7982 Long term (current) use of aspirin: Secondary | ICD-10-CM | POA: Diagnosis not present

## 2023-03-14 DIAGNOSIS — E1122 Type 2 diabetes mellitus with diabetic chronic kidney disease: Secondary | ICD-10-CM | POA: Diagnosis not present

## 2023-03-14 DIAGNOSIS — J440 Chronic obstructive pulmonary disease with acute lower respiratory infection: Secondary | ICD-10-CM | POA: Diagnosis not present

## 2023-03-14 DIAGNOSIS — I447 Left bundle-branch block, unspecified: Secondary | ICD-10-CM | POA: Diagnosis not present

## 2023-03-14 DIAGNOSIS — J209 Acute bronchitis, unspecified: Secondary | ICD-10-CM | POA: Diagnosis not present

## 2023-03-14 DIAGNOSIS — M170 Bilateral primary osteoarthritis of knee: Secondary | ICD-10-CM | POA: Diagnosis not present

## 2023-03-14 DIAGNOSIS — K5900 Constipation, unspecified: Secondary | ICD-10-CM | POA: Diagnosis not present

## 2023-03-14 DIAGNOSIS — K5790 Diverticulosis of intestine, part unspecified, without perforation or abscess without bleeding: Secondary | ICD-10-CM | POA: Diagnosis not present

## 2023-03-14 DIAGNOSIS — I5023 Acute on chronic systolic (congestive) heart failure: Secondary | ICD-10-CM | POA: Diagnosis not present

## 2023-03-14 DIAGNOSIS — I214 Non-ST elevation (NSTEMI) myocardial infarction: Secondary | ICD-10-CM | POA: Diagnosis not present

## 2023-03-14 DIAGNOSIS — K219 Gastro-esophageal reflux disease without esophagitis: Secondary | ICD-10-CM | POA: Diagnosis not present

## 2023-03-14 DIAGNOSIS — Z7984 Long term (current) use of oral hypoglycemic drugs: Secondary | ICD-10-CM | POA: Diagnosis not present

## 2023-03-14 DIAGNOSIS — J45909 Unspecified asthma, uncomplicated: Secondary | ICD-10-CM | POA: Diagnosis not present

## 2023-03-14 DIAGNOSIS — N2581 Secondary hyperparathyroidism of renal origin: Secondary | ICD-10-CM | POA: Diagnosis not present

## 2023-03-21 DIAGNOSIS — M170 Bilateral primary osteoarthritis of knee: Secondary | ICD-10-CM | POA: Diagnosis not present

## 2023-03-21 DIAGNOSIS — I214 Non-ST elevation (NSTEMI) myocardial infarction: Secondary | ICD-10-CM | POA: Diagnosis not present

## 2023-03-21 DIAGNOSIS — G8929 Other chronic pain: Secondary | ICD-10-CM | POA: Diagnosis not present

## 2023-03-21 DIAGNOSIS — Z7982 Long term (current) use of aspirin: Secondary | ICD-10-CM | POA: Diagnosis not present

## 2023-03-21 DIAGNOSIS — N184 Chronic kidney disease, stage 4 (severe): Secondary | ICD-10-CM | POA: Diagnosis not present

## 2023-03-21 DIAGNOSIS — M810 Age-related osteoporosis without current pathological fracture: Secondary | ICD-10-CM | POA: Diagnosis not present

## 2023-03-21 DIAGNOSIS — I5023 Acute on chronic systolic (congestive) heart failure: Secondary | ICD-10-CM | POA: Diagnosis not present

## 2023-03-21 DIAGNOSIS — J45909 Unspecified asthma, uncomplicated: Secondary | ICD-10-CM | POA: Diagnosis not present

## 2023-03-21 DIAGNOSIS — J209 Acute bronchitis, unspecified: Secondary | ICD-10-CM | POA: Diagnosis not present

## 2023-03-21 DIAGNOSIS — I447 Left bundle-branch block, unspecified: Secondary | ICD-10-CM | POA: Diagnosis not present

## 2023-03-21 DIAGNOSIS — J441 Chronic obstructive pulmonary disease with (acute) exacerbation: Secondary | ICD-10-CM | POA: Diagnosis not present

## 2023-03-21 DIAGNOSIS — J440 Chronic obstructive pulmonary disease with acute lower respiratory infection: Secondary | ICD-10-CM | POA: Diagnosis not present

## 2023-03-21 DIAGNOSIS — K5790 Diverticulosis of intestine, part unspecified, without perforation or abscess without bleeding: Secondary | ICD-10-CM | POA: Diagnosis not present

## 2023-03-21 DIAGNOSIS — D631 Anemia in chronic kidney disease: Secondary | ICD-10-CM | POA: Diagnosis not present

## 2023-03-21 DIAGNOSIS — I1310 Hypertensive heart and chronic kidney disease without heart failure, with stage 1 through stage 4 chronic kidney disease, or unspecified chronic kidney disease: Secondary | ICD-10-CM | POA: Diagnosis not present

## 2023-03-21 DIAGNOSIS — N2581 Secondary hyperparathyroidism of renal origin: Secondary | ICD-10-CM | POA: Diagnosis not present

## 2023-03-21 DIAGNOSIS — E785 Hyperlipidemia, unspecified: Secondary | ICD-10-CM | POA: Diagnosis not present

## 2023-03-21 DIAGNOSIS — E1122 Type 2 diabetes mellitus with diabetic chronic kidney disease: Secondary | ICD-10-CM | POA: Diagnosis not present

## 2023-03-21 DIAGNOSIS — K219 Gastro-esophageal reflux disease without esophagitis: Secondary | ICD-10-CM | POA: Diagnosis not present

## 2023-03-21 DIAGNOSIS — I428 Other cardiomyopathies: Secondary | ICD-10-CM | POA: Diagnosis not present

## 2023-03-21 DIAGNOSIS — M545 Low back pain, unspecified: Secondary | ICD-10-CM | POA: Diagnosis not present

## 2023-03-21 DIAGNOSIS — Z7984 Long term (current) use of oral hypoglycemic drugs: Secondary | ICD-10-CM | POA: Diagnosis not present

## 2023-03-21 DIAGNOSIS — K5900 Constipation, unspecified: Secondary | ICD-10-CM | POA: Diagnosis not present

## 2023-03-21 DIAGNOSIS — G4733 Obstructive sleep apnea (adult) (pediatric): Secondary | ICD-10-CM | POA: Diagnosis not present

## 2023-03-23 DIAGNOSIS — I1310 Hypertensive heart and chronic kidney disease without heart failure, with stage 1 through stage 4 chronic kidney disease, or unspecified chronic kidney disease: Secondary | ICD-10-CM | POA: Diagnosis not present

## 2023-03-23 DIAGNOSIS — G4733 Obstructive sleep apnea (adult) (pediatric): Secondary | ICD-10-CM | POA: Diagnosis not present

## 2023-03-23 DIAGNOSIS — I447 Left bundle-branch block, unspecified: Secondary | ICD-10-CM | POA: Diagnosis not present

## 2023-03-23 DIAGNOSIS — E785 Hyperlipidemia, unspecified: Secondary | ICD-10-CM | POA: Diagnosis not present

## 2023-03-23 DIAGNOSIS — M170 Bilateral primary osteoarthritis of knee: Secondary | ICD-10-CM | POA: Diagnosis not present

## 2023-03-23 DIAGNOSIS — M810 Age-related osteoporosis without current pathological fracture: Secondary | ICD-10-CM | POA: Diagnosis not present

## 2023-03-23 DIAGNOSIS — N184 Chronic kidney disease, stage 4 (severe): Secondary | ICD-10-CM | POA: Diagnosis not present

## 2023-03-23 DIAGNOSIS — K219 Gastro-esophageal reflux disease without esophagitis: Secondary | ICD-10-CM | POA: Diagnosis not present

## 2023-03-23 DIAGNOSIS — E1122 Type 2 diabetes mellitus with diabetic chronic kidney disease: Secondary | ICD-10-CM | POA: Diagnosis not present

## 2023-03-23 DIAGNOSIS — J440 Chronic obstructive pulmonary disease with acute lower respiratory infection: Secondary | ICD-10-CM | POA: Diagnosis not present

## 2023-03-23 DIAGNOSIS — I5023 Acute on chronic systolic (congestive) heart failure: Secondary | ICD-10-CM | POA: Diagnosis not present

## 2023-03-23 DIAGNOSIS — K5900 Constipation, unspecified: Secondary | ICD-10-CM | POA: Diagnosis not present

## 2023-03-23 DIAGNOSIS — Z7982 Long term (current) use of aspirin: Secondary | ICD-10-CM | POA: Diagnosis not present

## 2023-03-23 DIAGNOSIS — J45909 Unspecified asthma, uncomplicated: Secondary | ICD-10-CM | POA: Diagnosis not present

## 2023-03-23 DIAGNOSIS — I428 Other cardiomyopathies: Secondary | ICD-10-CM | POA: Diagnosis not present

## 2023-03-23 DIAGNOSIS — M545 Low back pain, unspecified: Secondary | ICD-10-CM | POA: Diagnosis not present

## 2023-03-23 DIAGNOSIS — I214 Non-ST elevation (NSTEMI) myocardial infarction: Secondary | ICD-10-CM | POA: Diagnosis not present

## 2023-03-23 DIAGNOSIS — D631 Anemia in chronic kidney disease: Secondary | ICD-10-CM | POA: Diagnosis not present

## 2023-03-23 DIAGNOSIS — J209 Acute bronchitis, unspecified: Secondary | ICD-10-CM | POA: Diagnosis not present

## 2023-03-23 DIAGNOSIS — N2581 Secondary hyperparathyroidism of renal origin: Secondary | ICD-10-CM | POA: Diagnosis not present

## 2023-03-23 DIAGNOSIS — Z7984 Long term (current) use of oral hypoglycemic drugs: Secondary | ICD-10-CM | POA: Diagnosis not present

## 2023-03-23 DIAGNOSIS — G8929 Other chronic pain: Secondary | ICD-10-CM | POA: Diagnosis not present

## 2023-03-23 DIAGNOSIS — J441 Chronic obstructive pulmonary disease with (acute) exacerbation: Secondary | ICD-10-CM | POA: Diagnosis not present

## 2023-03-23 DIAGNOSIS — K5790 Diverticulosis of intestine, part unspecified, without perforation or abscess without bleeding: Secondary | ICD-10-CM | POA: Diagnosis not present

## 2023-03-28 DIAGNOSIS — M170 Bilateral primary osteoarthritis of knee: Secondary | ICD-10-CM | POA: Diagnosis not present

## 2023-03-28 DIAGNOSIS — J3089 Other allergic rhinitis: Secondary | ICD-10-CM | POA: Diagnosis not present

## 2023-03-28 DIAGNOSIS — D649 Anemia, unspecified: Secondary | ICD-10-CM | POA: Diagnosis not present

## 2023-03-28 DIAGNOSIS — E119 Type 2 diabetes mellitus without complications: Secondary | ICD-10-CM | POA: Diagnosis not present

## 2023-03-28 DIAGNOSIS — I5022 Chronic systolic (congestive) heart failure: Secondary | ICD-10-CM | POA: Diagnosis not present

## 2023-03-28 DIAGNOSIS — Z79899 Other long term (current) drug therapy: Secondary | ICD-10-CM | POA: Diagnosis not present

## 2023-03-28 DIAGNOSIS — J209 Acute bronchitis, unspecified: Secondary | ICD-10-CM | POA: Diagnosis not present

## 2023-03-28 DIAGNOSIS — J440 Chronic obstructive pulmonary disease with acute lower respiratory infection: Secondary | ICD-10-CM | POA: Diagnosis not present

## 2023-03-28 DIAGNOSIS — I10 Essential (primary) hypertension: Secondary | ICD-10-CM | POA: Diagnosis not present

## 2023-03-29 DIAGNOSIS — Z7982 Long term (current) use of aspirin: Secondary | ICD-10-CM | POA: Diagnosis not present

## 2023-03-29 DIAGNOSIS — N184 Chronic kidney disease, stage 4 (severe): Secondary | ICD-10-CM | POA: Diagnosis not present

## 2023-03-29 DIAGNOSIS — K5790 Diverticulosis of intestine, part unspecified, without perforation or abscess without bleeding: Secondary | ICD-10-CM | POA: Diagnosis not present

## 2023-03-29 DIAGNOSIS — K5900 Constipation, unspecified: Secondary | ICD-10-CM | POA: Diagnosis not present

## 2023-03-29 DIAGNOSIS — I447 Left bundle-branch block, unspecified: Secondary | ICD-10-CM | POA: Diagnosis not present

## 2023-03-29 DIAGNOSIS — E1122 Type 2 diabetes mellitus with diabetic chronic kidney disease: Secondary | ICD-10-CM | POA: Diagnosis not present

## 2023-03-29 DIAGNOSIS — K219 Gastro-esophageal reflux disease without esophagitis: Secondary | ICD-10-CM | POA: Diagnosis not present

## 2023-03-29 DIAGNOSIS — I214 Non-ST elevation (NSTEMI) myocardial infarction: Secondary | ICD-10-CM | POA: Diagnosis not present

## 2023-03-29 DIAGNOSIS — G4733 Obstructive sleep apnea (adult) (pediatric): Secondary | ICD-10-CM | POA: Diagnosis not present

## 2023-03-29 DIAGNOSIS — I428 Other cardiomyopathies: Secondary | ICD-10-CM | POA: Diagnosis not present

## 2023-03-29 DIAGNOSIS — M810 Age-related osteoporosis without current pathological fracture: Secondary | ICD-10-CM | POA: Diagnosis not present

## 2023-03-29 DIAGNOSIS — J440 Chronic obstructive pulmonary disease with acute lower respiratory infection: Secondary | ICD-10-CM | POA: Diagnosis not present

## 2023-03-29 DIAGNOSIS — D631 Anemia in chronic kidney disease: Secondary | ICD-10-CM | POA: Diagnosis not present

## 2023-03-29 DIAGNOSIS — J209 Acute bronchitis, unspecified: Secondary | ICD-10-CM | POA: Diagnosis not present

## 2023-03-29 DIAGNOSIS — E785 Hyperlipidemia, unspecified: Secondary | ICD-10-CM | POA: Diagnosis not present

## 2023-03-29 DIAGNOSIS — M545 Low back pain, unspecified: Secondary | ICD-10-CM | POA: Diagnosis not present

## 2023-03-29 DIAGNOSIS — J441 Chronic obstructive pulmonary disease with (acute) exacerbation: Secondary | ICD-10-CM | POA: Diagnosis not present

## 2023-03-29 DIAGNOSIS — J45909 Unspecified asthma, uncomplicated: Secondary | ICD-10-CM | POA: Diagnosis not present

## 2023-03-29 DIAGNOSIS — M170 Bilateral primary osteoarthritis of knee: Secondary | ICD-10-CM | POA: Diagnosis not present

## 2023-03-29 DIAGNOSIS — I5023 Acute on chronic systolic (congestive) heart failure: Secondary | ICD-10-CM | POA: Diagnosis not present

## 2023-03-29 DIAGNOSIS — G8929 Other chronic pain: Secondary | ICD-10-CM | POA: Diagnosis not present

## 2023-03-29 DIAGNOSIS — Z7984 Long term (current) use of oral hypoglycemic drugs: Secondary | ICD-10-CM | POA: Diagnosis not present

## 2023-03-29 DIAGNOSIS — I1310 Hypertensive heart and chronic kidney disease without heart failure, with stage 1 through stage 4 chronic kidney disease, or unspecified chronic kidney disease: Secondary | ICD-10-CM | POA: Diagnosis not present

## 2023-03-29 DIAGNOSIS — N2581 Secondary hyperparathyroidism of renal origin: Secondary | ICD-10-CM | POA: Diagnosis not present

## 2023-04-08 ENCOUNTER — Telehealth: Payer: Self-pay | Admitting: Family

## 2023-04-08 NOTE — Progress Notes (Unsigned)
Advanced Heart Failure Clinic Note    PCP: Barbette Reichmann, MD (last seen 08/24) Primary Cardiologist: Dorothyann Peng, MD (last seen 07/24)   Chief Complaint:  HPI:  Ms Diane Patton is a 78 y/o female with a history of osteoarthritis, obstructive sleep apnea, iron deficiency anemia, LBBB, hyperlipidemia, HTN, GERD, DM, COPD, pulmonary HTN, meningitis 2023, CKD, NSTEMI, remote tobacco use and chronic heart failure.  Admitted 07/30/21 for septic shock with bacterial meningitis and streptococcal bacteremia. MRIs showed temporal lobe hemorrhage, and aspirin was held. She was also treated for acute respiratory failure with hypoxia, pseudogout, and anemia in CKD, transfused 1 PRBCs. She was discharged to a rehab facility.   Admitted 01/27/23 due to productive cough, SOB, wheezing and chest pain due to COPD/ HF exacerbation. Given IV lasix, steroids and bronchodilators. Mildly elevated troponin thought to be due to demand ischemia. BNP was 264.5. Echo EF 40-45% with mild LVH, mildly elevated PA pressure  Echo 08/17/15: EF of 40% with mild MR, AR and TR. Mild/mod pulmonary HTN. This is a slight improvement from 12/06/13 when her EF was 35-40%.  Echo 07/18/21: EF of 35%. Echo 09/04/22: EF 40-45% along with Grade I DD, mildly elevated PA pressure and mild/ moderate MR Echo 01/28/23: EF 40-45% with mild LVH, mildly elevated PA pressure  She presents today for a HF follow-up visit with a chief complaint of minimal fatigue with moderate exertion. Has associated shortness of breath along with this. Denies chest pain, cough, palpitations, left lower back pain, abdominal distention, pedal edema, dizziness, difficulty sleeping or weight gain. Has oral furosemide at home that she can take PRN. Has not taken any since her recent discharge but isn't sure what the dosage she has at home.   Previous cardiac studies:  Had cardiac catheterization done 05/12/14.  ROS: All systems negative except as listed in HPI, PMH and  Problem List.  SH:  Social History   Socioeconomic History   Marital status: Married    Spouse name: Not on file   Number of children: Not on file   Years of education: Not on file   Highest education level: Not on file  Occupational History   Occupation: retired  Tobacco Use   Smoking status: Former    Current packs/day: 0.00    Average packs/day: 1 pack/day for 15.0 years (15.0 ttl pk-yrs)    Types: Cigarettes    Start date: 07/21/1967    Quit date: 07/21/1982    Years since quitting: 40.7   Smokeless tobacco: Never  Vaping Use   Vaping status: Never Used  Substance and Sexual Activity   Alcohol use: No    Alcohol/week: 15.0 standard drinks of alcohol    Types: 15 Standard drinks or equivalent per week    Comment: quit drinking 04/1981   Drug use: No   Sexual activity: Not on file  Other Topics Concern   Not on file  Social History Narrative   Disabled. Regularly exercises.    Social Drivers of Corporate investment banker Strain: Low Risk  (10/02/2022)   Received from [facility]   Overall Financial Resource Strain (CARDIA)    Difficulty of Paying Living Expenses: Not hard at all  Food Insecurity: No Food Insecurity (01/28/2023)   Hunger Vital Sign    Worried About Running Out of Food in the Last Year: Never true    Ran Out of Food in the Last Year: Never true  Transportation Needs: No Transportation Needs (01/28/2023)   PRAPARE -  Administrator, Civil Service (Medical): No    Lack of Transportation (Non-Medical): No  Physical Activity: Not on file  Stress: Not on file  Social Connections: Not on file  Intimate Partner Violence: Not At Risk (01/28/2023)   Humiliation, Afraid, Rape, and Kick questionnaire    Fear of Current or Ex-Partner: No    Emotionally Abused: No    Physically Abused: No    Sexually Abused: No    FH:  Family History  Problem Relation Age of Onset   Alzheimer's disease Other    Heart attack Mother     Hypertension Mother    Cancer Brother        lung   Cancer Maternal Aunt        mouth and breast   Breast cancer Maternal Aunt    Cancer Daughter        throat   Dementia Father     Past Medical History:  Diagnosis Date   Asthma    Cardiomyopathy, nonischemic (HCC)    CHF (congestive heart failure) (HCC)    COPD (chronic obstructive pulmonary disease) (HCC)    Diabetes mellitus, type 2 (HCC)    Full dentures    Gastric ulcer without hemorrhage or perforation    GERD (gastroesophageal reflux disease)    HOH (hard of hearing)    Right Ear   HTN (hypertension)    Hyperlipidemia    IDA (iron deficiency anemia) 04/05/2020   Obstructive sleep apnea on CPAP    Not using CPAP   Osteoarthritis    osteo of both knees   Scoliosis    Seasonal allergies     Current Outpatient Medications  Medication Sig Dispense Refill   albuterol (PROVENTIL HFA;VENTOLIN HFA) 108 (90 BASE) MCG/ACT inhaler Inhale 2 puffs into the lungs every 6 (six) hours as needed for wheezing or shortness of breath.     ascorbic acid (VITAMIN C) 500 MG tablet Take 500 mg by mouth daily.     aspirin 81 MG chewable tablet Chew by mouth daily.     benzonatate (TESSALON) 200 MG capsule Take 200 mg by mouth 3 (three) times daily as needed for cough.     calcium carbonate (OS-CAL) 600 MG TABS tablet Take 600 mg by mouth 2 (two) times daily with a meal.      cetirizine (ZYRTEC) 10 MG tablet Take 10 mg by mouth daily.     cholecalciferol (VITAMIN D) 400 units TABS tablet Take 1,000 Units by mouth daily.     Cod Liver Oil w/Vit A & D CAPS Take 1 capsule by mouth daily.     empagliflozin (JARDIANCE) 10 MG TABS tablet Take 1 tablet (10 mg total) by mouth daily before breakfast. 90 tablet 3   ENTRESTO 49-51 MG Take 1 tablet by mouth twice daily 180 tablet 0   feeding supplement (ENSURE ENLIVE / ENSURE PLUS) LIQD Take 237 mLs by mouth 3 (three) times daily between meals. 21330 mL 0   ferrous gluconate (FERGON) 324 MG tablet  Take 324 mg by mouth daily with breakfast.     hydrALAZINE (APRESOLINE) 25 MG tablet Take 25 mg by mouth 2 (two) times daily.     isosorbide mononitrate (IMDUR) 30 MG 24 hr tablet Take 30 mg by mouth daily.     levocetirizine (XYZAL) 5 MG tablet Take 5 mg by mouth every evening.     metFORMIN (GLUCOPHAGE) 500 MG tablet Take 500 mg by mouth 2 (two) times daily with  a meal.     metoprolol succinate (TOPROL-XL) 25 MG 24 hr tablet Take 1 tablet (25 mg total) by mouth daily. 30 tablet 0   montelukast (SINGULAIR) 10 MG tablet Take 10 mg by mouth daily.     Multiple Vitamin (MULTIVITAMIN) tablet Take 1 tablet by mouth daily.      polyethylene glycol (MIRALAX / GLYCOLAX) 17 g packet Take 17 g by mouth daily as needed for severe constipation. 14 each 0   potassium chloride (KLOR-CON M) 10 MEQ tablet Take 10 mEq by mouth 2 (two) times daily.     rosuvastatin (CRESTOR) 20 MG tablet Take 20 mg by mouth at bedtime.     spironolactone (ALDACTONE) 25 MG tablet Take 0.5 tablets (12.5 mg total) by mouth daily. 45 tablet 3   traMADol-acetaminophen (ULTRACET) 37.5-325 MG tablet Take 1 tablet by mouth 2 (two) times daily as needed for moderate pain (pain score 4-6).     traZODone (DESYREL) 100 MG tablet Take 100 mg by mouth at bedtime.     No current facility-administered medications for this visit.   There were no vitals filed for this visit.  Wt Readings from Last 3 Encounters:  02/05/23 166 lb (75.3 kg)  01/27/23 160 lb (72.6 kg)  10/17/22 164 lb (74.4 kg)   Lab Results  Component Value Date   CREATININE 1.41 (H) 02/26/2023   CREATININE 1.59 (H) 02/05/2023   CREATININE 1.66 (H) 01/29/2023   PHYSICAL EXAM:  General:  Well appearing. No resp difficulty HEENT: normal Neck: supple. JVP flat. No lymphadenopathy or thryomegaly appreciated. Cor: PMI normal. Regular rate & rhythm. No rubs, gallops or murmurs. Lungs: clear Abdomen: soft, nontender, nondistended. No hepatosplenomegaly. No bruits or masses.   Extremities: no cyanosis, clubbing, rash, edema Neuro: alert & oriented x3, cranial nerves grossly intact. Moves all 4 extremities w/o difficulty. Affect pleasant.   ECG: not done   ASSESSMENT & PLAN:  1: NICM with mildly reduced ejection fraction- - likely due to HTN - NYHA class III - euvolemic - weighing daily; reminded to call for an overnight weight gain of > 2 pounds or a weekly weight gain of > 5 pounds - weight up 2 pounds from last visit here 4 months ago - Echo 08/17/15: EF of 40% with mild MR, AR and TR. Mild/mod pulmonary HTN. This is a slight improvement from 12/06/13 when her EF was 35-40%.  - Echo 07/18/21: EF of 35%.  - Echo 09/04/22: EF 40-45% along with Grade I DD, mildly elevated PA pressure and mild/ moderate MR - Echo 01/28/23: EF 40-45% with mild LVH, mildly elevated PA pressure - cardiac catheterization done 05/12/14 - not adding any salt to her food - drinking around 40-60 ounces of fluid daily - continue jardiance 10mg  daily - continue metoprolol succinate 25mg  daily - continue entresto 49/51mg  BID - continue furosemide 20mg  daily PRN; has not taken any of this since recent discharge - continue spironolactone 12.5mg  daily - saw cardiology Mellissa Kohut) 07/24 - BMET/ BNP today - BNP 01/27/23 was 264.5  2: HTN- - BP 118/52 - saw PCP (Hande) 08/24 - BMP 01/29/23 reviewed and shows sodium 138, potassium 3.8, creatinine 1.66 and GFR 31 - saw nephrology (Korrapati) 05/23  3: Diabetes- - A1c 01/28/23 was 7.3% - checks glucose every 2-3 days due to cost of test strips  4: Anemia- - saw hematology Cathie Hoops) 02/23 - hemoglobin 01/27/23 was 12.74  5: Back pain- - acute in nature an located on left lower back - no  radiation of pain, sciatica or change in bowel/ bladder habits - encouraged to reach out to PCP to schedule post-hospital appt as well as to address back pain   Return in 2 months, sooner if needed.       Delma Freeze, FNP 04/08/23

## 2023-04-08 NOTE — Telephone Encounter (Signed)
 Pt confirmed appt for 04/09/23

## 2023-04-09 ENCOUNTER — Ambulatory Visit: Payer: 59 | Attending: Family | Admitting: Family

## 2023-04-09 ENCOUNTER — Encounter: Payer: Self-pay | Admitting: Family

## 2023-04-09 VITALS — BP 137/56 | HR 79 | Wt 168.4 lb

## 2023-04-09 DIAGNOSIS — M1990 Unspecified osteoarthritis, unspecified site: Secondary | ICD-10-CM | POA: Insufficient documentation

## 2023-04-09 DIAGNOSIS — I252 Old myocardial infarction: Secondary | ICD-10-CM | POA: Diagnosis not present

## 2023-04-09 DIAGNOSIS — I5022 Chronic systolic (congestive) heart failure: Secondary | ICD-10-CM | POA: Insufficient documentation

## 2023-04-09 DIAGNOSIS — I10 Essential (primary) hypertension: Secondary | ICD-10-CM | POA: Diagnosis not present

## 2023-04-09 DIAGNOSIS — I130 Hypertensive heart and chronic kidney disease with heart failure and stage 1 through stage 4 chronic kidney disease, or unspecified chronic kidney disease: Secondary | ICD-10-CM | POA: Insufficient documentation

## 2023-04-09 DIAGNOSIS — E1122 Type 2 diabetes mellitus with diabetic chronic kidney disease: Secondary | ICD-10-CM | POA: Insufficient documentation

## 2023-04-09 DIAGNOSIS — K219 Gastro-esophageal reflux disease without esophagitis: Secondary | ICD-10-CM | POA: Insufficient documentation

## 2023-04-09 DIAGNOSIS — D509 Iron deficiency anemia, unspecified: Secondary | ICD-10-CM | POA: Insufficient documentation

## 2023-04-09 DIAGNOSIS — Z87891 Personal history of nicotine dependence: Secondary | ICD-10-CM | POA: Diagnosis not present

## 2023-04-09 DIAGNOSIS — J449 Chronic obstructive pulmonary disease, unspecified: Secondary | ICD-10-CM | POA: Diagnosis not present

## 2023-04-09 DIAGNOSIS — Z7984 Long term (current) use of oral hypoglycemic drugs: Secondary | ICD-10-CM | POA: Insufficient documentation

## 2023-04-09 DIAGNOSIS — D631 Anemia in chronic kidney disease: Secondary | ICD-10-CM | POA: Diagnosis not present

## 2023-04-09 DIAGNOSIS — E785 Hyperlipidemia, unspecified: Secondary | ICD-10-CM | POA: Insufficient documentation

## 2023-04-09 DIAGNOSIS — E119 Type 2 diabetes mellitus without complications: Secondary | ICD-10-CM | POA: Diagnosis not present

## 2023-04-09 DIAGNOSIS — I428 Other cardiomyopathies: Secondary | ICD-10-CM | POA: Insufficient documentation

## 2023-04-09 DIAGNOSIS — N1831 Chronic kidney disease, stage 3a: Secondary | ICD-10-CM

## 2023-04-09 DIAGNOSIS — I272 Pulmonary hypertension, unspecified: Secondary | ICD-10-CM | POA: Insufficient documentation

## 2023-04-09 DIAGNOSIS — E782 Mixed hyperlipidemia: Secondary | ICD-10-CM | POA: Diagnosis not present

## 2023-04-09 DIAGNOSIS — I447 Left bundle-branch block, unspecified: Secondary | ICD-10-CM | POA: Insufficient documentation

## 2023-04-09 DIAGNOSIS — G4733 Obstructive sleep apnea (adult) (pediatric): Secondary | ICD-10-CM | POA: Insufficient documentation

## 2023-04-09 DIAGNOSIS — N189 Chronic kidney disease, unspecified: Secondary | ICD-10-CM | POA: Diagnosis not present

## 2023-04-09 NOTE — Patient Instructions (Signed)
Call us in the future if you need [place] for anything. Follow closely with your heart doctor

## 2023-04-10 ENCOUNTER — Other Ambulatory Visit: Payer: Self-pay

## 2023-04-10 MED ORDER — SACUBITRIL-VALSARTAN 49-51 MG PO TABS: 1.0000 | ORAL_TABLET | Freq: Two times a day (BID) | ORAL | 1 refills | Status: DC

## 2023-05-15 DIAGNOSIS — J3089 Other allergic rhinitis: Secondary | ICD-10-CM | POA: Diagnosis not present

## 2023-05-15 DIAGNOSIS — I5022 Chronic systolic (congestive) heart failure: Secondary | ICD-10-CM | POA: Diagnosis not present

## 2023-05-15 DIAGNOSIS — Z1231 Encounter for screening mammogram for malignant neoplasm of breast: Secondary | ICD-10-CM | POA: Diagnosis not present

## 2023-05-15 DIAGNOSIS — Z1389 Encounter for screening for other disorder: Secondary | ICD-10-CM | POA: Diagnosis not present

## 2023-05-15 DIAGNOSIS — Z0000 Encounter for general adult medical examination without abnormal findings: Secondary | ICD-10-CM | POA: Diagnosis not present

## 2023-05-15 DIAGNOSIS — E1122 Type 2 diabetes mellitus with diabetic chronic kidney disease: Secondary | ICD-10-CM | POA: Diagnosis not present

## 2023-05-15 DIAGNOSIS — D631 Anemia in chronic kidney disease: Secondary | ICD-10-CM | POA: Diagnosis not present

## 2023-05-15 DIAGNOSIS — N189 Chronic kidney disease, unspecified: Secondary | ICD-10-CM | POA: Diagnosis not present

## 2023-05-15 DIAGNOSIS — I428 Other cardiomyopathies: Secondary | ICD-10-CM | POA: Diagnosis not present

## 2023-05-15 DIAGNOSIS — M1712 Unilateral primary osteoarthritis, left knee: Secondary | ICD-10-CM | POA: Diagnosis not present

## 2023-06-12 ENCOUNTER — Encounter: Payer: Self-pay | Admitting: Oncology

## 2023-06-18 ENCOUNTER — Telehealth: Payer: Self-pay | Admitting: Family

## 2023-06-18 NOTE — Telephone Encounter (Signed)
 Called to confirm/remind patient of their appointment at the [facility] Clinic on 06/19/23.   Appointment:   [x] Confirmed  [] Left mess   [] No answer/No voice mail  [] VM Full/unable to leave message  [] Phone not in service  Patient reminded to bring all medications and/or complete list.  Confirmed patient has transportation. Gave directions, instructed to utilize valet parking.

## 2023-06-19 ENCOUNTER — Encounter: Payer: 59 | Admitting: Family

## 2023-06-19 ENCOUNTER — Encounter: Payer: Self-pay | Admitting: Oncology

## 2023-06-19 DIAGNOSIS — E7800 Pure hypercholesterolemia, unspecified: Secondary | ICD-10-CM | POA: Diagnosis not present

## 2023-06-19 DIAGNOSIS — I10 Essential (primary) hypertension: Secondary | ICD-10-CM | POA: Diagnosis not present

## 2023-06-19 DIAGNOSIS — E1100 Type 2 diabetes mellitus with hyperosmolarity without nonketotic hyperglycemic-hyperosmolar coma (NKHHC): Secondary | ICD-10-CM | POA: Diagnosis not present

## 2023-06-19 DIAGNOSIS — E785 Hyperlipidemia, unspecified: Secondary | ICD-10-CM | POA: Diagnosis not present

## 2023-06-19 DIAGNOSIS — Z9889 Other specified postprocedural states: Secondary | ICD-10-CM | POA: Diagnosis not present

## 2023-06-19 DIAGNOSIS — I428 Other cardiomyopathies: Secondary | ICD-10-CM | POA: Diagnosis not present

## 2023-06-19 DIAGNOSIS — I5022 Chronic systolic (congestive) heart failure: Secondary | ICD-10-CM | POA: Diagnosis not present

## 2023-06-19 NOTE — Progress Notes (Deleted)
 Advanced Heart Failure Clinic Note     PCP: Antonio Baumgarten, MD (last seen 03/25) Primary Cardiologist: Percival Brace, MD/ Barton Like, [place] (last seen 12/24)   Chief Complaint:  HPI:  Ms Detwiler is a 78 y/o female with a history of osteoarthritis, obstructive sleep apnea, iron  deficiency anemia, LBBB, hyperlipidemia, HTN, GERD, DM, COPD, pulmonary HTN, meningitis 2023, CKD, NSTEMI, remote tobacco use and chronic heart failure.  Admitted 07/30/21 for septic shock with bacterial meningitis and streptococcal bacteremia. MRIs showed temporal lobe hemorrhage, and aspirin  was held. She was also treated for acute respiratory failure with hypoxia, pseudogout, and anemia in CKD, transfused 1 PRBCs. She was discharged to a rehab facility.   Admitted 01/27/23 due to productive cough, SOB, wheezing and chest pain due to COPD/ HF exacerbation. Given IV lasix , steroids and bronchodilators. Mildly elevated troponin thought to be due to demand ischemia. BNP was 264.5. Echo EF 40-45% with mild LVH, mildly elevated PA pressure  Echo 07/18/21: EF of 35%. Echo 09/04/22: EF 40-45% along with Grade I DD, mildly elevated PA pressure and mild/ moderate MR Echo 01/28/23: EF 40-45% with mild LVH, mildly elevated PA pressure  She presents today for a HF follow-up visit with a chief complaint of moderate fatigue with exertion. Has associated shortness of breath, occasional chest pain, cough and chronic trouble sleeping. Sleeping on 4 small pillows. Denies palpitations, abdominal distention, pedal edema, dizziness or weight gain. Not adding salt to her food.    Previous cardiac studies:  Had cardiac catheterization done 05/12/14.  ROS: All systems negative except as listed in HPI, PMH and Problem List.  SH:  Social History   Socioeconomic History   Marital status: Married    Spouse name: Not on file   Number of children: Not on file   Years of education: Not on file   Highest education level: Not on file   Occupational History   Occupation: retired  Tobacco Use   Smoking status: Former    Current packs/day: 0.00    Average packs/day: 1 pack/day for 15.0 years (15.0 ttl pk-yrs)    Types: Cigarettes    Start date: 07/21/1967    Quit date: 07/21/1982    Years since quitting: 40.9   Smokeless tobacco: Never  Vaping Use   Vaping status: Never Used  Substance and Sexual Activity   Alcohol use: No    Alcohol/week: 15.0 standard drinks of alcohol    Types: 15 Standard drinks or equivalent per week    Comment: quit drinking 04/1981   Drug use: No   Sexual activity: Not on file  Other Topics Concern   Not on file  Social History Narrative   Disabled. Regularly exercises.    Social Drivers of Corporate investment banker Strain: Low Risk  (05/15/2023)   Received from [facility]   Overall Financial Resource Strain (CARDIA)    Difficulty of Paying Living Expenses: Not hard at all  Food Insecurity: No Food Insecurity (05/15/2023)   Received from [facility]   Hunger Vital Sign    Worried About Running Out of Food in the Last Year: Never true    Ran Out of Food in the Last Year: Never true  Transportation Needs: No Transportation Needs (05/15/2023)   Received from [facility] - Transportation    In the past 12 months, has lack of transportation kept you from medical appointments or from getting medications?: No    Lack of  Transportation (Non-Medical): No  Physical Activity: Not on file  Stress: Not on file  Social Connections: Not on file  Intimate Partner Violence: Not At Risk (01/28/2023)   Humiliation, Afraid, Rape, and Kick questionnaire    Fear of Current or Ex-Partner: No    Emotionally Abused: No    Physically Abused: No    Sexually Abused: No    FH:  Family History  Problem Relation Age of Onset   Alzheimer's disease Other    Heart attack Mother    Hypertension Mother    Cancer Brother        lung   Cancer  Maternal Aunt        mouth and breast   Breast cancer Maternal Aunt    Cancer Daughter        throat   Dementia Father     Past Medical History:  Diagnosis Date   Asthma    Cardiomyopathy, nonischemic (HCC)    CHF (congestive heart failure) (HCC)    COPD (chronic obstructive pulmonary disease) (HCC)    Diabetes mellitus, type 2 (HCC)    Full dentures    Gastric ulcer without hemorrhage or perforation    GERD (gastroesophageal reflux disease)    HOH (hard of hearing)    Right Ear   HTN (hypertension)    Hyperlipidemia    IDA (iron  deficiency anemia) 04/05/2020   Obstructive sleep apnea on CPAP    Not using CPAP   Osteoarthritis    osteo of both knees   Scoliosis    Seasonal allergies     Current Outpatient Medications  Medication Sig Dispense Refill   albuterol  (PROVENTIL  HFA;VENTOLIN  HFA) 108 (90 BASE) MCG/ACT inhaler Inhale 2 puffs into the lungs every 6 (six) hours as needed for wheezing or shortness of breath.     ascorbic acid  (VITAMIN C ) 500 MG tablet Take 500 mg by mouth daily.     aspirin  81 MG chewable tablet Chew by mouth daily.     benzonatate  (TESSALON ) 200 MG capsule Take 200 mg by mouth 3 (three) times daily as needed for cough.     calcium  carbonate (OS-CAL) 600 MG TABS tablet Take 600 mg by mouth 2 (two) times daily with a meal.      cetirizine (ZYRTEC) 10 MG tablet Take 10 mg by mouth daily.     cholecalciferol  (VITAMIN D ) 400 units TABS tablet Take 1,000 Units by mouth daily.     Cod Liver Oil w/Vit A & D CAPS Take 1 capsule by mouth daily.     empagliflozin  (JARDIANCE ) 10 MG TABS tablet Take 1 tablet (10 mg total) by mouth daily before breakfast. 90 tablet 3   feeding supplement (ENSURE ENLIVE / ENSURE PLUS) LIQD Take 237 mLs by mouth 3 (three) times daily between meals. 21330 mL 0   ferrous gluconate  (FERGON) 324 MG tablet Take 324 mg by mouth daily with breakfast.     hydrALAZINE  (APRESOLINE ) 25 MG tablet Take 25 mg by mouth 2 (two) times daily.      isosorbide  mononitrate (IMDUR ) 30 MG 24 hr tablet Take 30 mg by mouth daily.     levocetirizine (XYZAL ) 5 MG tablet Take 5 mg by mouth every evening.     metFORMIN (GLUCOPHAGE) 500 MG tablet Take 500 mg by mouth 2 (two) times daily with a meal.     metoprolol  succinate (TOPROL -XL) 25 MG 24 hr tablet Take 1 tablet (25 mg total) by mouth daily. 30 tablet 0   montelukast  (  SINGULAIR ) 10 MG tablet Take 10 mg by mouth daily.     Multiple Vitamin (MULTIVITAMIN) tablet Take 1 tablet by mouth daily.      polyethylene glycol (MIRALAX  / GLYCOLAX ) 17 g packet Take 17 g by mouth daily as needed for severe constipation. 14 each 0   potassium chloride  (KLOR-CON  M) 10 MEQ tablet Take 10 mEq by mouth 2 (two) times daily.     rosuvastatin  (CRESTOR ) 20 MG tablet Take 20 mg by mouth at bedtime.     sacubitril -valsartan  (ENTRESTO ) 49-51 MG Take 1 tablet by mouth 2 (two) times daily. 180 tablet 1   spironolactone  (ALDACTONE ) 25 MG tablet Take 0.5 tablets (12.5 mg total) by mouth daily. 45 tablet 3   traMADol -acetaminophen  (ULTRACET ) 37.5-325 MG tablet Take 1 tablet by mouth 2 (two) times daily as needed for moderate pain (pain score 4-6).     traZODone  (DESYREL ) 100 MG tablet Take 100 mg by mouth at bedtime.     No current facility-administered medications for this visit.        PHYSICAL EXAM:  General: Well appearing. No resp difficulty HEENT: HOH Neck: supple, no JVD Cor: Regular rhythm, rate. No rubs, gallops or murmurs Lungs: clear Abdomen: soft, nontender, nondistended. Extremities: no cyanosis, clubbing, rash, edema Neuro: alert & oriented X 3. Moves all 4 extremities w/o difficulty. Affect pleasant   ECG: not done   ASSESSMENT & PLAN:  1: NICM with mildly reduced ejection fraction- - likely due to HTN - NYHA class III - euvolemic - weighing daily; reminded to call for an overnight weight gain of > 2 pounds or a weekly weight gain of > 5 pounds - weight 168.4 pounds from last visit here 2  months ago - Echo 07/18/21: EF of 35%.  - Echo 09/04/22: EF 40-45% along with Grade I DD, mildly elevated PA pressure and mild/ moderate MR - Echo 01/28/23: EF 40-45% with mild LVH, mildly elevated PA pressure - cardiac catheterization done 05/12/14 - not adding any salt to her food - drinking around 40-60 ounces of fluid daily - continue jardiance  10mg  daily - continue entresto  49/51mg  BID - continue metoprolol  succinate 25mg  daily - continue spironolactone  12.5mg  daily - saw cardiology Anthonette Bastos) 12/24 - BNP 01/27/23 was 264.5  2: HTN- - BP  - saw PCP (Hande) 03/25 - BMP 03/28/23 reviewed and shows sodium 141, potassium 4.5, creatinine 1.3 and GFR 42 - saw nephrology Milta Alosa) 05/23  3: Diabetes- - A1c 03/28/23 reviewed and was 7.2% - checks glucose every few days due to cost  4: Anemia- - saw hematology Wilhelmenia Harada) 02/23 - hemoglobin 03/28/23 reviewed and was 10.6  5: Hyperlipidemia- - continue rosuvastatin  20mg  daily - LDL 03/28/23 reviewed and was [id number]    Charlette Console, [place] 06/19/23

## 2023-06-22 ENCOUNTER — Other Ambulatory Visit: Payer: Self-pay | Admitting: Family

## 2023-07-07 ENCOUNTER — Other Ambulatory Visit: Payer: Self-pay | Admitting: Family

## 2023-07-18 DIAGNOSIS — R1084 Generalized abdominal pain: Secondary | ICD-10-CM | POA: Diagnosis not present

## 2023-07-18 DIAGNOSIS — K5792 Diverticulitis of intestine, part unspecified, without perforation or abscess without bleeding: Secondary | ICD-10-CM | POA: Diagnosis not present

## 2023-07-18 DIAGNOSIS — I130 Hypertensive heart and chronic kidney disease with heart failure and stage 1 through stage 4 chronic kidney disease, or unspecified chronic kidney disease: Secondary | ICD-10-CM | POA: Diagnosis not present

## 2023-07-18 DIAGNOSIS — E785 Hyperlipidemia, unspecified: Secondary | ICD-10-CM | POA: Diagnosis not present

## 2023-07-18 DIAGNOSIS — J449 Chronic obstructive pulmonary disease, unspecified: Secondary | ICD-10-CM | POA: Diagnosis not present

## 2023-07-18 DIAGNOSIS — Z880 Allergy status to penicillin: Secondary | ICD-10-CM | POA: Diagnosis not present

## 2023-07-18 DIAGNOSIS — N1832 Chronic kidney disease, stage 3b: Secondary | ICD-10-CM | POA: Diagnosis not present

## 2023-07-18 DIAGNOSIS — R519 Headache, unspecified: Secondary | ICD-10-CM | POA: Diagnosis not present

## 2023-07-18 DIAGNOSIS — K5732 Diverticulitis of large intestine without perforation or abscess without bleeding: Secondary | ICD-10-CM | POA: Diagnosis not present

## 2023-07-18 DIAGNOSIS — E1122 Type 2 diabetes mellitus with diabetic chronic kidney disease: Secondary | ICD-10-CM | POA: Diagnosis not present

## 2023-07-18 DIAGNOSIS — Z79899 Other long term (current) drug therapy: Secondary | ICD-10-CM | POA: Diagnosis not present

## 2023-07-18 DIAGNOSIS — I5030 Unspecified diastolic (congestive) heart failure: Secondary | ICD-10-CM | POA: Diagnosis not present

## 2023-07-18 DIAGNOSIS — R079 Chest pain, unspecified: Secondary | ICD-10-CM | POA: Diagnosis not present

## 2023-07-18 DIAGNOSIS — Z87891 Personal history of nicotine dependence: Secondary | ICD-10-CM | POA: Diagnosis not present

## 2023-07-18 DIAGNOSIS — K219 Gastro-esophageal reflux disease without esophagitis: Secondary | ICD-10-CM | POA: Diagnosis not present

## 2023-07-19 DIAGNOSIS — R519 Headache, unspecified: Secondary | ICD-10-CM | POA: Diagnosis not present

## 2023-07-19 DIAGNOSIS — R1084 Generalized abdominal pain: Secondary | ICD-10-CM | POA: Diagnosis not present

## 2023-07-19 DIAGNOSIS — K5792 Diverticulitis of intestine, part unspecified, without perforation or abscess without bleeding: Secondary | ICD-10-CM | POA: Diagnosis not present

## 2023-07-19 DIAGNOSIS — R079 Chest pain, unspecified: Secondary | ICD-10-CM | POA: Diagnosis not present

## 2023-09-04 DIAGNOSIS — H6983 Other specified disorders of Eustachian tube, bilateral: Secondary | ICD-10-CM | POA: Diagnosis not present

## 2023-09-04 DIAGNOSIS — H6123 Impacted cerumen, bilateral: Secondary | ICD-10-CM | POA: Diagnosis not present

## 2023-09-06 ENCOUNTER — Telehealth: Payer: Self-pay | Admitting: Family

## 2023-09-06 MED ORDER — SPIRONOLACTONE 25 MG PO TABS: 12.5000 mg | ORAL_TABLET | Freq: Every day | ORAL | 3 refills | Status: AC

## 2023-09-06 NOTE — Progress Notes (Signed)
 Medication sent to pharmacy

## 2023-11-11 DIAGNOSIS — Z23 Encounter for immunization: Secondary | ICD-10-CM | POA: Diagnosis not present

## 2023-11-11 DIAGNOSIS — N1832 Chronic kidney disease, stage 3b: Secondary | ICD-10-CM | POA: Diagnosis not present

## 2023-11-11 DIAGNOSIS — D649 Anemia, unspecified: Secondary | ICD-10-CM | POA: Diagnosis not present

## 2023-11-11 DIAGNOSIS — J301 Allergic rhinitis due to pollen: Secondary | ICD-10-CM | POA: Diagnosis not present

## 2023-11-11 DIAGNOSIS — E1165 Type 2 diabetes mellitus with hyperglycemia: Secondary | ICD-10-CM | POA: Diagnosis not present

## 2023-11-11 DIAGNOSIS — Z0000 Encounter for general adult medical examination without abnormal findings: Secondary | ICD-10-CM | POA: Diagnosis not present

## 2023-11-11 DIAGNOSIS — I5022 Chronic systolic (congestive) heart failure: Secondary | ICD-10-CM | POA: Diagnosis not present

## 2023-11-11 DIAGNOSIS — M1712 Unilateral primary osteoarthritis, left knee: Secondary | ICD-10-CM | POA: Diagnosis not present

## 2023-11-11 DIAGNOSIS — I428 Other cardiomyopathies: Secondary | ICD-10-CM | POA: Diagnosis not present

## 2023-11-18 DIAGNOSIS — I5022 Chronic systolic (congestive) heart failure: Secondary | ICD-10-CM | POA: Diagnosis not present

## 2023-11-18 DIAGNOSIS — J3089 Other allergic rhinitis: Secondary | ICD-10-CM | POA: Diagnosis not present

## 2023-11-18 DIAGNOSIS — I428 Other cardiomyopathies: Secondary | ICD-10-CM | POA: Diagnosis not present

## 2023-11-18 DIAGNOSIS — N189 Chronic kidney disease, unspecified: Secondary | ICD-10-CM | POA: Diagnosis not present

## 2023-11-18 DIAGNOSIS — J019 Acute sinusitis, unspecified: Secondary | ICD-10-CM | POA: Diagnosis not present

## 2023-11-18 DIAGNOSIS — E1122 Type 2 diabetes mellitus with diabetic chronic kidney disease: Secondary | ICD-10-CM | POA: Diagnosis not present

## 2023-11-18 DIAGNOSIS — R262 Difficulty in walking, not elsewhere classified: Secondary | ICD-10-CM | POA: Diagnosis not present

## 2023-11-18 DIAGNOSIS — D631 Anemia in chronic kidney disease: Secondary | ICD-10-CM | POA: Diagnosis not present

## 2023-11-18 DIAGNOSIS — M170 Bilateral primary osteoarthritis of knee: Secondary | ICD-10-CM | POA: Diagnosis not present

## 2023-11-18 DIAGNOSIS — I130 Hypertensive heart and chronic kidney disease with heart failure and stage 1 through stage 4 chronic kidney disease, or unspecified chronic kidney disease: Secondary | ICD-10-CM | POA: Diagnosis not present

## 2023-11-28 ENCOUNTER — Encounter: Payer: Self-pay | Admitting: Emergency Medicine

## 2023-11-28 ENCOUNTER — Other Ambulatory Visit: Payer: Self-pay

## 2023-11-28 DIAGNOSIS — I509 Heart failure, unspecified: Secondary | ICD-10-CM | POA: Diagnosis not present

## 2023-11-28 DIAGNOSIS — I110 Hypertensive heart disease with heart failure: Secondary | ICD-10-CM | POA: Diagnosis not present

## 2023-11-28 DIAGNOSIS — R519 Headache, unspecified: Secondary | ICD-10-CM | POA: Insufficient documentation

## 2023-11-28 DIAGNOSIS — R42 Dizziness and giddiness: Secondary | ICD-10-CM | POA: Diagnosis present

## 2023-11-28 DIAGNOSIS — E119 Type 2 diabetes mellitus without complications: Secondary | ICD-10-CM | POA: Diagnosis not present

## 2023-11-28 LAB — URINALYSIS, ROUTINE W REFLEX MICROSCOPIC
Bilirubin Urine: NEGATIVE
Glucose, UA: >=500 mg/dL — AB
Hgb urine dipstick: NEGATIVE
Ketones, ur: NEGATIVE mg/dL
Nitrite: NEGATIVE
Protein, ur: 30 mg/dL — AB
Specific Gravity, Urine: 1.014 (ref 1.005–1.030)
pH: 5 (ref 5.0–8.0)

## 2023-11-28 LAB — CBC
HCT: 35.5 % — ABNORMAL LOW (ref 36.0–46.0)
Hemoglobin: 10.9 g/dL — ABNORMAL LOW (ref 12.0–15.0)
MCH: 30.8 pg (ref 26.0–34.0)
MCHC: 30.7 g/dL (ref 30.0–36.0)
MCV: 100.3 fL — ABNORMAL HIGH (ref 80.0–100.0)
Platelets: 237 K/uL (ref 150–400)
RBC: 3.54 MIL/uL — ABNORMAL LOW (ref 3.87–5.11)
RDW: 13.1 % (ref 11.5–15.5)
WBC: 7.4 K/uL (ref 4.0–10.5)
nRBC: 0 % (ref 0.0–0.2)

## 2023-11-28 LAB — CBG MONITORING, ED: Glucose-Capillary: 137 mg/dL — ABNORMAL HIGH (ref 70–99)

## 2023-11-28 NOTE — ED Triage Notes (Signed)
 Pt arrives via ems from home c/o dizziness and weakness after eating some fritos. Pt reports her SBP was 205 but had since come down, no medications given. Pt states her dizziness is a little better since arriving to ER. Denies cp or sob.

## 2023-11-28 NOTE — ED Triage Notes (Addendum)
 Pt to ED via EMS from home, weakness and dizziness that began after taking B meds. Bp 162/68. Pt reports she feels better at this time

## 2023-11-29 ENCOUNTER — Emergency Department

## 2023-11-29 ENCOUNTER — Emergency Department
Admission: EM | Admit: 2023-11-29 | Discharge: 2023-11-29 | Disposition: A | Discharge status: Home / Self Care | Attending: Emergency Medicine | Admitting: Emergency Medicine

## 2023-11-29 DIAGNOSIS — R42 Dizziness and giddiness: Secondary | ICD-10-CM | POA: Diagnosis not present

## 2023-11-29 DIAGNOSIS — R519 Headache, unspecified: Secondary | ICD-10-CM

## 2023-11-29 LAB — COMPREHENSIVE METABOLIC PANEL WITH GFR
ALT: 11 U/L (ref 0–44)
AST: 21 U/L (ref 15–41)
Albumin: 4.2 g/dL (ref 3.5–5.0)
Alkaline Phosphatase: 37 U/L — ABNORMAL LOW (ref 38–126)
Anion gap: 11 (ref 5–15)
BUN: 15 mg/dL (ref 8–23)
CO2: 18 mmol/L — ABNORMAL LOW (ref 22–32)
Calcium: 9.6 mg/dL (ref 8.9–10.3)
Chloride: 111 mmol/L (ref 98–111)
Creatinine, Ser: 1.42 mg/dL — ABNORMAL HIGH (ref 0.44–1.00)
GFR, Estimated: 37 mL/min — ABNORMAL LOW (ref >60)
Glucose, Bld: 143 mg/dL — ABNORMAL HIGH (ref 70–99)
Potassium: 3.9 mmol/L (ref 3.5–5.1)
Sodium: 140 mmol/L (ref 135–145)
Total Bilirubin: 0.8 mg/dL (ref 0.0–1.2)
Total Protein: 7.1 g/dL (ref 6.5–8.1)

## 2023-11-29 LAB — TROPONIN I (HIGH SENSITIVITY)
Troponin I (High Sensitivity): 13 ng/L (ref <18)
Troponin I (High Sensitivity): 22 ng/L — ABNORMAL HIGH (ref <18)

## 2023-11-29 MED ORDER — ACETAMINOPHEN 500 MG PO TABS
1000.0000 mg | ORAL_TABLET | Freq: Once | ORAL | Status: AC
  Administered 2023-11-29: 1000 mg via ORAL
  Filled 2023-11-29: qty 2

## 2023-11-29 NOTE — ED Notes (Signed)
 Patient transported to CT

## 2023-11-29 NOTE — ED Provider Notes (Signed)
 [facility] Provider Note    Event Date/Time   First MD Initiated Contact with Patient 11/29/23 0106     (approximate)   History   Weakness and Dizziness   HPI  Diane Patton is a 78 y.o. female who presents to the ED for evaluation of Weakness and Dizziness   I reviewed a cardiology clinic visit from 10/1.  History of CHF, HTN, OSA, DM.  Patient presents for evaluation of 3-4 weeks of intermittent frontal headaches and intermittent dizziness.  Reports dizziness is an unsteadiness and feeling like she may fall.  She reports no significant falls though.    Physical Exam   Triage Vital Signs: ED Triage Vitals [11/28/23 2306]  Encounter Vitals Group     BP (!) 165/78     Girls Systolic BP Percentile      Girls Diastolic BP Percentile      Boys Systolic BP Percentile      Boys Diastolic BP Percentile      Pulse Rate 83     Resp 18     Temp 97.8 F (36.6 C)     Temp Source Oral     SpO2 100 %     Weight      Height      Head Circumference      Peak Flow      Pain Score 0     Pain Loc      Pain Education      Exclude from Growth Chart     Most recent vital signs: Vitals:   11/28/23 2306 11/29/23 0436  BP: (!) 165/78 109/88  Pulse: 83 83  Resp: 18 (!) 21  Temp: 97.8 F (36.6 C) 97.9 F (36.6 C)  SpO2: 100% 99%    General: Awake, no distress.  CV:  Good peripheral perfusion.  Resp:  Normal effort.  Abd:  No distention.  MSK:  No deformity noted.  Neuro:  No focal deficits appreciated. Cranial nerves II through XII intact 5/5 strength and sensation in all 4 extremities Other:     ED Results / Procedures / Treatments   Labs (all labs ordered are listed, but only abnormal results are displayed) Labs Reviewed  COMPREHENSIVE METABOLIC PANEL WITH GFR - Abnormal; Notable for the following components:      Result Value   CO2 18 (*)    Glucose, Bld 143 (*)    Creatinine, Ser 1.42 (*)    Alkaline Phosphatase 37 (*)    GFR,  Estimated 37 (*)    All other components within normal limits  CBC - Abnormal; Notable for the following components:   RBC 3.54 (*)    Hemoglobin 10.9 (*)    HCT 35.5 (*)    MCV 100.3 (*)    All other components within normal limits  URINALYSIS, ROUTINE W REFLEX MICROSCOPIC - Abnormal; Notable for the following components:   Color, Urine YELLOW (*)    APPearance HAZY (*)    Glucose, UA >=500 (*)    Protein, ur 30 (*)    Leukocytes,Ua SMALL (*)    Bacteria, UA RARE (*)    All other components within normal limits  CBG MONITORING, ED - Abnormal; Notable for the following components:   Glucose-Capillary 137 (*)    All other components within normal limits  TROPONIN I (HIGH SENSITIVITY) - Abnormal; Notable for the following components:   Troponin I (High Sensitivity) 22 (*)    All other components within normal limits  TROPONIN I (HIGH SENSITIVITY)    EKG Sinus rhythm with a rate of 86 bpm.  Left bundle without STEMI by Sgarbossa criteria. Similar morphology as previous  RADIOLOGY CT head interpreted by me without evidence of acute intracranial pathology  Official radiology report(s): CT HEAD WO CONTRAST ( ) Result Date: 11/29/2023 EXAM: CT HEAD WITHOUT CONTRAST 11/29/2023 04:22:32 AM TECHNIQUE: CT of the head was performed without the administration of intravenous contrast. Automated exposure control, iterative reconstruction, and/or weight based adjustment of the mA/kV was utilized to reduce the radiation dose to as low as reasonably achievable. COMPARISON: Brain MRI 08/03/2021, Head CT 07/30/2021, Brain MRI 08/26/2015. CLINICAL HISTORY: 78 year old female. New headaches (3-4 weeks), intermittent dizziness, weakness after eating, SBP 205, dizziness improved. FINDINGS: BRAIN AND VENTRICLES: No acute hemorrhage. No evidence of acute infarct. No hydrocephalus. No extra-axial collection. No mass effect or midline shift. Pronounced chronic partially empty sella appearance, stable from  MRI in 2017. Stable cerebral volume since 2023. Gray Gerard differentiation stable and within minimal limits for age. No suspicious intracranial vascular hyperdensity. ORBITS: No acute abnormality. 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 No middle ear or mastoid bone erosion is evident. SOFT TISSUES AND SKULL: No acute soft tissue abnormality. No skull fracture. IMPRESSION: 1. No acute intracranial abnormality, stable and negative for age non-contrast CT appearance of the brain. 2. 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 Electronically signed by: Helayne Hurst MD 11/29/2023 04:33 AM EDT RP Workstation: [id number]    PROCEDURES and INTERVENTIONS:  .1-3 Lead EKG Interpretation  Performed by: Claudene Rover, MD Authorized by: Claudene Rover, MD     Interpretation: normal     ECG rate:  80   ECG rate assessment: normal     Rhythm: sinus rhythm     Ectopy: none     Conduction: normal     Medications  acetaminophen  (TYLENOL ) tablet 1,000 mg (1,000 mg Oral Given 11/29/23 0258)     IMPRESSION / MDM / ASSESSMENT AND PLAN / ED COURSE  I reviewed the triage vital signs and the nursing notes.  Differential diagnosis includes, but is not limited to, BPPV, stroke, labyrinthitis, cardiac dysrhythmia  {Patient presents with symptoms of an acute illness or injury that is potentially life-threatening.  Patient presents with chronic intermittent headaches and dizziness without evidence of acute pathology and suitable for outpatient management.  Neuroexam without deficits.  CT head reassuring.  No dysrhythmias on the monitor.  Troponins are flat and she has no chest discomfort.  Renal dysfunction near baseline.  I considered observation admission for this patient but ultimately we plan on outpatient management with close return precautions and neurology referral.  Clinical Course as of 11/29/23  0625  Fri Nov 29, 2023  0226 3-4 weeks intermittent frontal headaches and dizziness. [DS]  0622 Reassessed, feeling much better.  We discussed reassuring workup and return precautions, neurology follow-up [DS]    Clinical Course User Index [DS] Claudene Rover, MD     FINAL CLINICAL IMPRESSION(S) / ED DIAGNOSES   Final diagnoses:  Dizziness  Frontal headache     Rx / DC Orders   ED Discharge Orders     None        Note:  This document was prepared using Dragon voice recognition software and may include unintentional dictation errors.   Claudene Rover, MD 11/29/23 [phone]

## 2023-11-29 NOTE — Discharge Instructions (Signed)
Use Tylenol for pain and fevers.  Up to 1000 mg per dose, up to 4 times per day.  Do not take more than 4000 mg of Tylenol/acetaminophen within 24 hours.  Return to the ED with any worsening symptoms.  

## 2023-11-29 NOTE — ED Notes (Signed)
 Pt given DC instructions by previous RN. Pt taken from ED in wheelchair by this RN. Pt in NAD at time of Departure.

## 2023-12-02 ENCOUNTER — Other Ambulatory Visit: Payer: Self-pay | Admitting: Family

## 2023-12-15 ENCOUNTER — Emergency Department
Admission: EM | Admit: 2023-12-15 | Discharge: 2023-12-15 | Disposition: A | Discharge status: Home / Self Care | Attending: Emergency Medicine | Admitting: Emergency Medicine

## 2023-12-15 DIAGNOSIS — R42 Dizziness and giddiness: Secondary | ICD-10-CM | POA: Diagnosis not present

## 2023-12-15 DIAGNOSIS — I10 Essential (primary) hypertension: Secondary | ICD-10-CM | POA: Insufficient documentation

## 2023-12-15 DIAGNOSIS — R519 Headache, unspecified: Secondary | ICD-10-CM | POA: Insufficient documentation

## 2023-12-15 DIAGNOSIS — Z79899 Other long term (current) drug therapy: Secondary | ICD-10-CM | POA: Insufficient documentation

## 2023-12-15 DIAGNOSIS — E869 Volume depletion, unspecified: Secondary | ICD-10-CM

## 2023-12-15 LAB — COMPREHENSIVE METABOLIC PANEL WITH GFR
ALT: 9 U/L (ref 0–44)
AST: 16 U/L (ref 15–41)
Albumin: 4.1 g/dL (ref 3.5–5.0)
Alkaline Phosphatase: 36 U/L — ABNORMAL LOW (ref 38–126)
Anion gap: 8 (ref 5–15)
BUN: 21 mg/dL (ref 8–23)
CO2: 19 mmol/L — ABNORMAL LOW (ref 22–32)
Calcium: 9.1 mg/dL (ref 8.9–10.3)
Chloride: 111 mmol/L (ref 98–111)
Creatinine, Ser: 1.67 mg/dL — ABNORMAL HIGH (ref 0.44–1.00)
GFR, Estimated: 31 mL/min — ABNORMAL LOW (ref >60)
Glucose, Bld: 117 mg/dL — ABNORMAL HIGH (ref 70–99)
Potassium: 4 mmol/L (ref 3.5–5.1)
Sodium: 138 mmol/L (ref 135–145)
Total Bilirubin: 0.6 mg/dL (ref 0.0–1.2)
Total Protein: 7.1 g/dL (ref 6.5–8.1)

## 2023-12-15 MED ORDER — SODIUM CHLORIDE 0.9 % IV BOLUS: 500.0000 mL | Freq: Once | INTRAVENOUS | Status: DC

## 2023-12-15 MED ORDER — MECLIZINE HCL 25 MG PO TABS
12.5000 mg | ORAL_TABLET | Freq: Once | ORAL | Status: AC
  Administered 2023-12-15: 12.5 mg via ORAL
  Filled 2023-12-15: qty 1

## 2023-12-15 NOTE — Discharge Instructions (Addendum)
 Your workup in the Emergency Department today was reassuring.  We did not find any specific abnormalities.  We recommend you drink plenty of fluids, take your regular medications and/or any new ones prescribed today, and follow up with the doctor(s) listed in these documents as recommended.  Return to the Emergency Department if you develop new or worsening symptoms that concern you.

## 2023-12-15 NOTE — ED Provider Notes (Signed)
 [facility] Provider Note    Event Date/Time   First MD Initiated Contact with Patient 12/15/23 0109     (approximate)   History   Headache (Hx of HTN, states pressure has been high for past few days--took extra dose of her metoprolol  . Denies other symptoms.)   HPI Diane Patton is a 78 y.o. female who presents for evaluation of frontal headache and intermittent dizziness.  She said that her blood pressure was higher earlier but it has gotten better.  She has been seen for the symptoms in the past.  She said that she feels more lightheaded or dizzy when she gets up to go to the bathroom.  She has no numbness or weakness in any of her extremities, no visual disturbances, no recent fever.  Of note, she says that she had meningitis 2 years ago and she is sure that it was caused by getting bitten by cockroaches in her house.  She said that she has gotten some more bites recently from the cockroaches so she wants to make sure she does not have meningitis.     Physical Exam   Triage Vital Signs: ED Triage Vitals  Encounter Vitals Group     BP 12/15/23 0100 (!) 151/52     Girls Systolic BP Percentile --      Girls Diastolic BP Percentile --      Boys Systolic BP Percentile --      Boys Diastolic BP Percentile --      Pulse Rate 12/15/23 0100 74     Resp 12/15/23 0100 20     Temp 12/15/23 0100 98.1 F (36.7 C)     Temp Source 12/15/23 0100 Oral     SpO2 12/15/23 0100 98 %     Weight 12/15/23 0101 78 kg (172 lb)     Height 12/15/23 0101 1.6 m (5' 3)     Head Circumference --      Peak Flow --      Pain Score 12/15/23 0059 7     Pain Loc --      Pain Education --      Exclude from Growth Chart --     Most recent vital signs: Vitals:   12/15/23 0100  BP: (!) 151/52  Pulse: 74  Resp: 20  Temp: 98.1 F (36.7 C)  SpO2: 98%    General: Awake, no distress.  Awake and alert, pupils are equal and reactive, no nystagmus, normal extraocular  movement. CV:  Good peripheral perfusion.  Regular rate and rhythm.  Normal heart sounds. Resp:  Normal effort. Speaking easily and comfortably, no accessory muscle usage nor intercostal retractions.  Lungs are clear to auscultation bilaterally. Abd:  No distention.  Other:   NIHSS 0, reassuring neuroexam.  No dysmetria on finger-to-nose testing, normal grip strength in bilateral major muscle groups strength in upper and lower extremities.  No facial droop.  Patient has no neck pain or stiffness and no pain or tenderness with full flexion, extension, and rotation of head and neck to side-to-side.   ED Results / Procedures / Treatments   Labs (all labs ordered are listed, but only abnormal results are displayed) Labs Reviewed  COMPREHENSIVE METABOLIC PANEL WITH GFR - Abnormal; Notable for the following components:      Result Value   CO2 19 (*)    Glucose, Bld 117 (*)    Creatinine, Ser 1.67 (*)    Alkaline Phosphatase 36 (*)    GFR,  Estimated 31 (*)    All other components within normal limits  CBC WITH DIFFERENTIAL/PLATELET  CBC WITH DIFFERENTIAL/PLATELET     EKG  ED ECG REPORT I, Darleene Dome, the attending physician, personally viewed and interpreted this ECG.  Date: 12/15/2023 EKG Time: 1:00 AM Rate: 75 Rhythm: normal sinus rhythm QRS Axis: normal Intervals: Left bundle branch block ST/T Wave abnormalities: Non-specific ST segment / T-wave changes, but no clear evidence of acute ischemia. Narrative Interpretation: no definitive evidence of acute ischemia; does not meet STEMI criteria.    PROCEDURES:  Critical Care performed: No  Procedures    IMPRESSION / MDM / ASSESSMENT AND PLAN / ED COURSE  I reviewed the triage vital signs and the nursing notes.                              Differential diagnosis includes, but is not limited to, vertigo, volume depletion, CVA.  Patient's presentation is most consistent with acute presentation with potential threat to  life or bodily function.  Labs/studies ordered: CMP, CBC with differential  Interventions/Medications given:  Medications  meclizine (ANTIVERT) tablet 12.5 mg (12.5 mg Oral Given 12/15/23 0248)    (Note:  hospital course my include additional interventions and/or labs/studies not listed above.)   I reviewed the patient's medical record and see that she was here about 2 weeks ago for the same symptoms and had a reassuring evaluation including CT scan.  The patient confirmed that her symptoms are basically the same.  She said that she felt more dizzy when she got up and went to the bathroom after coming to the emergency department.  I will give a small dose of meclizine as this may be helpful but I do not want to give her too much since she is 78 years old.  Her labs are suggestive of very mild AKI so I will give her a small fluid bolus.  The patient thought she would be appropriate for discharge after this.  I reassured her that there is no evidence that she has any meningitis and that I very much doubted her meningitis before was due to cockroach bites.  She is comfortable with the plan for following up with her regular doctor.  Of note, her husband came to the hospital a few hours before she did by EMS and is critically ill, it is unclear whether his current status and present in the emergency department has any bearing on her current symptoms.  However she will be taken to her husband's room once she is discharged.     Clinical Course as of 12/15/23 9740  Sun Dec 15, 2023  0257 Nurses have tried 3 times and are unable to start an IV.  Patient does not want to continue with IV sticks and she is able to tolerate oral intake without any difficulty.  I am encouraging her to drink plenty of fluids and to rehydrate.  The patient's medical screening exam is reassuring with no indication of an emergent medical condition requiring hospitalization or additional evaluation at this point.  The patient is  safe and appropriate for discharge and outpatient follow up. [CF]    Clinical Course User Index [CF] Dome Darleene, MD     FINAL CLINICAL IMPRESSION(S) / ED DIAGNOSES   Final diagnoses:  None     Rx / DC Orders   ED Discharge Orders     None  Note:  This document was prepared using Dragon voice recognition software and may include unintentional dictation errors.   Gordan Huxley, MD 12/15/23 [phone]

## 2023-12-15 NOTE — ED Notes (Signed)
 Assisted to commode and back to bed. Condition improved. To be transported home by family.

## 2024-01-03 ENCOUNTER — Other Ambulatory Visit: Payer: Self-pay | Admitting: Family

## 2024-01-19 NOTE — Consults (Signed)
 Care Management Initial Transition Planning Assessment            General Care Manager / Social Worker assessed the patient by : In person interview with patient, Discussion with Clinical Care team, Medical record review Orientation Level: Oriented X4 Functional level prior to admission: Independent Reason for referral: Discharge Planning  Contact/Decision Maker Extended Emergency Contact Information Primary Emergency Contact: summers,shanana Mobile Phone: [phone] Relation: Grandchild Secondary Emergency Contact: TONYE, TANCREDI Home Phone: [phone] Mobile Phone: [phone] Relation: None  Legal Next of Kin / Guardian / POA / Advance Directives    Advance Directive (Medical Treatment) Does patient have an advance directive covering medical treatment?: Patient does not have advance directive covering medical treatment. Reason patient does not have an advance directive covering medical treatment:: Patient does not wish to complete one at this time.  Health Care Decision Maker [HCDM] (Medical & Mental Health Treatment) Healthcare Decision Maker: HCDM documented in the HCDM/Contact Info section. Information offered on HCDM, Medical & Mental Health advance directives:: Patient declined information.  Advance Directive (Mental Health Treatment) Does patient have an advance directive covering mental health treatment?: Patient does not have advance directive covering mental health treatment. Reason patient does not have an advance directive covering mental health treatment:: Patient does not wish to complete one at this time.  Readmission Information  Have you been hospitalized in the last 30 days?: No   Patient Information Lives with: Spouse/significant other  Type of Residence: Private residence     Location/Detail: [address] [facility] [place] [address]  Home Phone:  [phone]  Support Systems/Concerns: Family Members, Spouse  Responsibilities/Dependents at  home?: No  Home Care services in place prior to admission?: No      Outpatient/Community Resources in place prior to admission: Clinic Agency detail (Name/Phone #): PCP [employer]  Equipment Currently Used at Home: walker, rolling, commode chair, other (see comments) Current HME Agency (Name/Phone #): [facility]  Currently receiving outpatient dialysis?: No    Financial Information    Need for financial assistance?: Yes Type of financial assistance required: Community resources (Request placed to add SW to [facility] orders)  Social Drivers of Health Social Drivers of Health were addressed in provider documentation.  Please refer to patient history. Social Drivers of Health   Food Insecurity: No Food Insecurity (01/19/2024)   Hunger Vital Sign   . Worried About Programme Researcher, Broadcasting/film/video in the Last Year: Never true   . Ran Out of Food in the Last Year: Never true  Tobacco Use: Low Risk (01/19/2024)   Patient History   . Smoking Tobacco Use: Never   . Smokeless Tobacco Use: Never   . Passive Exposure: Not on file  Recent Concern: Tobacco Use - Medium Risk (12/06/2023)   Received from [facility]   Patient History   . Smoking Tobacco Use: Former   . Smokeless Tobacco Use: Never   . Passive Exposure: Not on file  Transportation Needs: No Transportation Needs (01/19/2024)   PRAPARE - Transportation   . Lack of Transportation (Medical): No   . Lack of Transportation (Non-Medical): No  Alcohol Use: Not on file  Housing: Low Risk (01/19/2024)   Housing   . Within the past 12 months, have you ever stayed: outside, in a car, in a tent, in an overnight shelter, or temporarily in someone else's home (i.e. couch-surfing)?: No   . Are you worried about losing your housing?: No  Physical Activity: Not on file  Utilities: High  Risk (01/19/2024)   Utilities   . Within the past 12 months, have you been unable to get utilities (heat, electricity) when it was really  needed?: Yes  Stress: Not on file  Interpersonal Safety: Not At Risk (01/17/2024)   Interpersonal Safety   . Unsafe Where You Currently Live: No   . Physically Hurt by Anyone: No   . Abused by Anyone: No  Substance Use: Not on file (07/19/2023)  Intimate Partner Violence: Not At Risk (01/28/2023)   Received from [facility]   Humiliation, Afraid, Rape, and Kick questionnaire   . Within the last year, have you been afraid of your partner or ex-partner?: No   . Within the last year, have you been humiliated or emotionally abused in other ways by your partner or ex-partner?: No   . Within the last year, have you been kicked, hit, slapped, or otherwise physically hurt by your partner or ex-partner?: No   . Within the last year, have you been raped or forced to have any kind of sexual activity by your partner or ex-partner?: No  Social Connections: Not on file  Financial Resource Strain: Medium Risk (01/19/2024)   Overall Financial Resource Strain (CARDIA)   . Difficulty of Paying Living Expenses: Somewhat hard  Health Literacy: Not on file  Internet Connectivity: Not on file    Complex Discharge Information  Is patient identified as a difficult/complex discharge?: No  Discharge Needs Assessment Concerns to be Addressed: coping/stress, discharge planning, home safety, medication  Clinical Risk Factors: > 65, New Diagnosis, Multiple Diagnoses (Chronic), Poor Health Literacy, Functional Limitations, Visual or Hearing Impaired  Barriers to taking medications: No ([facility] [address]  Prior overnight hospital stay or ED visit in last 90 days: No   Anticipated Changes Related to Illness: none  Equipment Needed After Discharge: walker, rolling, other (see comments) (rollator walker recommended)  Discharge Facility/Level of Care Needs: other (see comments) (HH arranged)  Readmission Risk of Unplanned Readmission Score: UNPLANNED READMISSION SCORE: 18.19% Predictive Model  Details        18% (Medium)  Factor Value   Calculated 01/19/2024 12:04 32% Number of active inpatient medication orders 43   [facility] Risk of Unplanned Readmission Model 10% ECG/EKG order present in last 6 months    7% Imaging order present in last 6 months    7% Age 20    6% Latest hemoglobin low (10.8 g/dL)    6% Phosphorous result present    6% Charlson Comorbidity Index 5    6% Number of ED visits in last six months 1    5% Diagnosis of deficiency anemia present    5% Active anticoagulant inpatient medication order present    5% Latest creatinine high (1.42 mg/dL)    4% Diagnosis of renal failure present    1% Active ulcer inpatient medication order present    1% Current length of stay 0.86 days    Readmitted Within the Last 30 Days? (No if blank)  Patient at risk for readmission?: Yes  Discharge Plan Screen findings are: Discharge planning needs identified or anticipated (Comment). [facility] and HME)  Expected Discharge Date: 01/19/2024  Expected Transfer from Critical Care: 01/23/24  Quality data for continuing care services shared with patient and/or representative?: Yes Patient and/or family were provided with choice of facilities / services that are available and appropriate to meet post hospital care needs?: Yes  List choices in order highest to lowest preferred, if applicable. : no preference  Initial Assessment  complete?: Yes

## 2024-01-20 ENCOUNTER — Telehealth: Payer: Self-pay | Admitting: Family

## 2024-01-20 NOTE — Telephone Encounter (Signed)
 Called pt and advised pt it is ok to follow ED recommendation but that she needs to follow up with [facility] as she is being follow by them now.

## 2024-03-09 ENCOUNTER — Encounter: Payer: Self-pay | Admitting: Oncology

## 2024-03-11 ENCOUNTER — Other Ambulatory Visit: Payer: Self-pay

## 2024-03-11 ENCOUNTER — Emergency Department

## 2024-03-11 ENCOUNTER — Encounter: Payer: Self-pay | Admitting: Emergency Medicine

## 2024-03-11 ENCOUNTER — Emergency Department
Admission: EM | Admit: 2024-03-11 | Discharge: 2024-03-12 | Disposition: A | Discharge status: Home / Self Care | Attending: Emergency Medicine | Admitting: Emergency Medicine

## 2024-03-11 DIAGNOSIS — I509 Heart failure, unspecified: Secondary | ICD-10-CM | POA: Insufficient documentation

## 2024-03-11 DIAGNOSIS — R42 Dizziness and giddiness: Secondary | ICD-10-CM | POA: Diagnosis present

## 2024-03-11 DIAGNOSIS — R7989 Other specified abnormal findings of blood chemistry: Secondary | ICD-10-CM | POA: Diagnosis not present

## 2024-03-11 DIAGNOSIS — N289 Disorder of kidney and ureter, unspecified: Secondary | ICD-10-CM | POA: Diagnosis not present

## 2024-03-11 DIAGNOSIS — I10 Essential (primary) hypertension: Secondary | ICD-10-CM | POA: Insufficient documentation

## 2024-03-11 DIAGNOSIS — J449 Chronic obstructive pulmonary disease, unspecified: Secondary | ICD-10-CM | POA: Diagnosis not present

## 2024-03-11 LAB — COMPREHENSIVE METABOLIC PANEL WITH GFR
ALT: 5 U/L (ref 0–44)
AST: 12 U/L — ABNORMAL LOW (ref 15–41)
Albumin: 4 g/dL (ref 3.5–5.0)
Alkaline Phosphatase: 42 U/L (ref 38–126)
Anion gap: 13 (ref 5–15)
BUN: 31 mg/dL — ABNORMAL HIGH (ref 8–23)
CO2: 20 mmol/L — ABNORMAL LOW (ref 22–32)
Calcium: 10.9 mg/dL — ABNORMAL HIGH (ref 8.9–10.3)
Chloride: 105 mmol/L (ref 98–111)
Creatinine, Ser: 1.42 mg/dL — ABNORMAL HIGH (ref 0.44–1.00)
GFR, Estimated: 37 mL/min — ABNORMAL LOW
Glucose, Bld: 140 mg/dL — ABNORMAL HIGH (ref 70–99)
Potassium: 4.3 mmol/L (ref 3.5–5.1)
Sodium: 138 mmol/L (ref 135–145)
Total Bilirubin: 0.3 mg/dL (ref 0.0–1.2)
Total Protein: 6.2 g/dL — ABNORMAL LOW (ref 6.5–8.1)

## 2024-03-11 LAB — CBC
HCT: 33.7 % — ABNORMAL LOW (ref 36.0–46.0)
Hemoglobin: 10.8 g/dL — ABNORMAL LOW (ref 12.0–15.0)
MCH: 31.5 pg (ref 26.0–34.0)
MCHC: 32 g/dL (ref 30.0–36.0)
MCV: 98.3 fL (ref 80.0–100.0)
Platelets: 231 K/uL (ref 150–400)
RBC: 3.43 MIL/uL — ABNORMAL LOW (ref 3.87–5.11)
RDW: 12.8 % (ref 11.5–15.5)
WBC: 8.2 K/uL (ref 4.0–10.5)
nRBC: 0 % (ref 0.0–0.2)

## 2024-03-11 LAB — TROPONIN T, HIGH SENSITIVITY
Troponin T High Sensitivity: 33 ng/L — ABNORMAL HIGH (ref 0–19)
Troponin T High Sensitivity: 34 ng/L — ABNORMAL HIGH (ref 0–19)

## 2024-03-11 NOTE — ED Provider Notes (Signed)
----------------------------------------- °  11:22 PM on 03/11/2024 -----------------------------------------  Blood pressure 131/62, pulse 78, temperature 98 F (36.7 C), temperature source Oral, resp. rate 18, last menstrual period 06/20/1967, SpO2 100%.   In short, Diane Patton is a 79 y.o. female with a chief complaint of lightheadedness and high blood pressure.  Refer to the original H&P for additional details.  The current plan of care is to follow-up repeat troponin.  Likely discharge with outpatient follow-up.  Labs are reassuring, on reassessment patient is feeling significantly better.  Did discuss with her about outpatient follow-up with primary care this week or next week to get reassessed.  Otherwise considered but no indication for inpatient admission at this time, she is safe for outpatient management.  Will discharge with strict return precautions.  Clinical Course as of 03/11/24 2345  Wed Mar 11, 2024  2341 Troponin T, High Sensitivity(!) Troponin is stable [TT]  2341 CT HEAD WO CONTRAST ( ) 1. No acute intracranial abnormality.  [TT]    Clinical Course User Index [TT] Waymond Lorelle Cummins, MD     Medications - No data to display   ED Discharge Orders     None      Final diagnoses:  Dizziness  Hypertension, unspecified type      Waymond Lorelle Cummins, MD 03/11/24 2345

## 2024-03-11 NOTE — ED Triage Notes (Addendum)
 Patient presents via EMS from home with c/o headache and intermittent dizziness since 1300. Patient also reports elevated blood pressure and reports taking blood pressure pill at 1400 as advised by pcp. Patient reports hx HTN and states pcp discontinued BP meds approx 3 months ago

## 2024-03-11 NOTE — ED Provider Notes (Signed)
 "  [facility] Provider Note    Event Date/Time   First MD Initiated Contact with Patient 03/11/24 2128     (approximate)  History   Chief Complaint: Headache  HPI  Diane Patton is a 79 y.o. female with a past medical history of hypertension, hyperlipidemia, COPD, CHF, presents to the emergency department for dizziness/lightheadedness and elevated blood pressure.  According to the patient around 1:00 today she felt swimmy headed.  Patient states she ended up taking her blood pressure and it was elevated greater than 200.  She states her doctor recently took her off of her blood pressure medications approximately 3 months ago.  She called her doctor and they advised her to take a blood pressure pill and to go to the emergency department.  Patient states she took a blood pressure pill around 2 or 3 PM, her blood pressure was still somewhat elevated so she came to the emergency department this evening for evaluation.  Patient denies any chest pain.  Denies any weakness or numbness.   Physical Exam   Triage Vital Signs: ED Triage Vitals  Encounter Vitals Group     BP 03/11/24 2111 (!) 180/65     Girls Systolic BP Percentile --      Girls Diastolic BP Percentile --      Boys Systolic BP Percentile --      Boys Diastolic BP Percentile --      Pulse Rate 03/11/24 2111 (!) 48     Resp 03/11/24 2111 18     Temp 03/11/24 2111 98 F (36.7 C)     Temp Source 03/11/24 2111 Oral     SpO2 03/11/24 2111 95 %     Weight --      Height --      Head Circumference --      Peak Flow --      Pain Score 03/11/24 2110 0     Pain Loc --      Pain Education --      Exclude from Growth Chart --     Most recent vital signs: Vitals:   03/11/24 2111 03/11/24 2128  BP: (!) 180/65 (!) 144/47  Pulse: (!) 48   Resp: 18   Temp: 98 F (36.7 C)   SpO2: 95%     General: Awake, no distress.  CV:  Good peripheral perfusion.  Regular rate and rhythm  Resp:  Normal effort.  Equal  breath sounds bilaterally.  Abd:  No distention.  Soft, nontender.  No rebound or guarding.  ED Results / Procedures / Treatments   EKG  EKG viewed and interpreted by myself shows what appears to be a junctional rhythm around 50 bpm with a narrow QRS, normal axis, normal intervals, nonspecific ST changes.  Likely left bundle branch block.  Patient's prior EKG of October 2025 shows a left lower branch block as well morphology is somewhat different compared to today's.    RADIOLOGY  I have reviewed and interpreted the CT head images.  No large bleed seen on my evaluation. Radiology is read the CT scan as negative.  MEDICATIONS ORDERED IN ED: Medications - No data to display   IMPRESSION / MDM / ASSESSMENT AND PLAN / ED COURSE  I reviewed the triage vital signs and the nursing notes.  Patient's presentation is most consistent with acute presentation with potential threat to life or bodily function.  Patient presents to the emergency department after a lightheaded episode where she felt  swimmy headed at home.  Patient states she checked her blood pressure and it was elevated greater than 200 so she took a hydralazine  (patient believes it was hydralazine  but could have been hydrochlorothiazide).  Patient was concerned/came to the emergency department for evaluation this evening.  Here the patient appears well, she states her symptoms have largely resolved.  Patient's lab work is reassuring with a normal CBC.  Chemistry shows renal insufficiency but largely at baseline.  Patient's troponin is slightly elevated at 33.  CT scan of the head is negative.  Troponin elevation could be due to the patient's chronic kidney disease however given the patient's near syncopal episode earlier today around 1 or 2 PM we will obtain a repeat troponin as a precaution.  I have updated the patient on her findings.  Blood pressure is much improved currently 128/50.  If the patient's repeat troponin is unchanged  anticipate discharge home with outpatient follow-up.  Patient is agreeable to this plan as well.  FINAL CLINICAL IMPRESSION(S) / ED DIAGNOSES   Dizziness Hypertension   Note:  This document was prepared using Dragon voice recognition software and may include unintentional dictation errors.   Dorothyann Drivers, MD 03/11/24 2246  "

## 2024-03-11 NOTE — Discharge Instructions (Addendum)
 Please make sure to keep her self hydrated, please also follow-up with your primary care doctor this week or early next week to get reassessed.

## 2024-03-19 ENCOUNTER — Encounter: Payer: Self-pay | Admitting: Oncology

## 2024-03-23 DIAGNOSIS — I739 Peripheral vascular disease, unspecified: Secondary | ICD-10-CM | POA: Insufficient documentation

## 2024-03-23 DIAGNOSIS — I6529 Occlusion and stenosis of unspecified carotid artery: Secondary | ICD-10-CM | POA: Insufficient documentation

## 2024-03-24 ENCOUNTER — Telehealth (INDEPENDENT_AMBULATORY_CARE_PROVIDER_SITE_OTHER): Payer: Self-pay

## 2024-03-26 ENCOUNTER — Encounter (INDEPENDENT_AMBULATORY_CARE_PROVIDER_SITE_OTHER): Admitting: Vascular Surgery

## 2024-03-26 DIAGNOSIS — J410 Simple chronic bronchitis: Secondary | ICD-10-CM

## 2024-03-26 DIAGNOSIS — I739 Peripheral vascular disease, unspecified: Secondary | ICD-10-CM

## 2024-03-26 DIAGNOSIS — E119 Type 2 diabetes mellitus without complications: Secondary | ICD-10-CM

## 2024-03-26 DIAGNOSIS — I214 Non-ST elevation (NSTEMI) myocardial infarction: Secondary | ICD-10-CM

## 2024-03-26 DIAGNOSIS — I6523 Occlusion and stenosis of bilateral carotid arteries: Secondary | ICD-10-CM

## 2024-03-26 DIAGNOSIS — I10 Essential (primary) hypertension: Secondary | ICD-10-CM

## 2024-04-09 ENCOUNTER — Encounter (INDEPENDENT_AMBULATORY_CARE_PROVIDER_SITE_OTHER): Admitting: Vascular Surgery
